# Patient Record
Sex: Female | Born: 1980 | Hispanic: Yes | Smoking: Never smoker
Health system: Southern US, Community
[De-identification: ages and names within clinical notes are randomized; demographics above are authoritative.]

## PROBLEM LIST (undated history)

## (undated) ENCOUNTER — Inpatient Hospital Stay (HOSPITAL_COMMUNITY): Payer: Self-pay

## (undated) DIAGNOSIS — F419 Anxiety disorder, unspecified: Secondary | ICD-10-CM

## (undated) DIAGNOSIS — M549 Dorsalgia, unspecified: Secondary | ICD-10-CM

## (undated) DIAGNOSIS — G43909 Migraine, unspecified, not intractable, without status migrainosus: Secondary | ICD-10-CM

## (undated) DIAGNOSIS — F32A Depression, unspecified: Secondary | ICD-10-CM

## (undated) DIAGNOSIS — M542 Cervicalgia: Secondary | ICD-10-CM

## (undated) DIAGNOSIS — F329 Major depressive disorder, single episode, unspecified: Secondary | ICD-10-CM

## (undated) DIAGNOSIS — M1990 Unspecified osteoarthritis, unspecified site: Secondary | ICD-10-CM

## (undated) DIAGNOSIS — J45909 Unspecified asthma, uncomplicated: Secondary | ICD-10-CM

## (undated) DIAGNOSIS — K219 Gastro-esophageal reflux disease without esophagitis: Secondary | ICD-10-CM

## (undated) HISTORY — PX: OTHER SURGICAL HISTORY: SHX169

## (undated) HISTORY — DX: Dorsalgia, unspecified: M54.9

---

## 2012-01-23 ENCOUNTER — Emergency Department (HOSPITAL_COMMUNITY)
Admission: EM | Admit: 2012-01-23 | Discharge: 2012-01-24 | Disposition: A | Discharge status: Home / Self Care | Payer: Worker's Compensation | Attending: Emergency Medicine | Admitting: Emergency Medicine

## 2012-01-23 ENCOUNTER — Encounter (HOSPITAL_COMMUNITY): Payer: Self-pay

## 2012-01-23 DIAGNOSIS — R110 Nausea: Secondary | ICD-10-CM | POA: Insufficient documentation

## 2012-01-23 DIAGNOSIS — R109 Unspecified abdominal pain: Secondary | ICD-10-CM | POA: Insufficient documentation

## 2012-01-23 HISTORY — DX: Cervicalgia: M54.2

## 2012-01-23 MED ORDER — SODIUM CHLORIDE 0.9 % IV BOLUS (SEPSIS)
1000.0000 mL | Freq: Once | INTRAVENOUS | Status: AC
  Administered 2012-01-24: 1000 mL via INTRAVENOUS

## 2012-01-23 MED ORDER — MORPHINE SULFATE 4 MG/ML IJ SOLN
6.0000 mg | Freq: Once | INTRAMUSCULAR | Status: AC
  Administered 2012-01-24: 6 mg via INTRAVENOUS
  Filled 2012-01-23: qty 2

## 2012-01-23 NOTE — ED Notes (Signed)
Pt complains of generalized abd pain since last Wednesday, nausea, no vomiting or diarrhea

## 2012-01-24 ENCOUNTER — Emergency Department (HOSPITAL_COMMUNITY): Payer: Worker's Compensation

## 2012-01-24 LAB — COMPREHENSIVE METABOLIC PANEL
Albumin: 3.8 g/dL (ref 3.5–5.2)
BUN: 12 mg/dL (ref 6–23)
Calcium: 8.4 mg/dL (ref 8.4–10.5)
Chloride: 105 mEq/L (ref 96–112)
Creatinine, Ser: 0.67 mg/dL (ref 0.50–1.10)
GFR calc non Af Amer: >90 mL/min (ref >90)
Total Bilirubin: 0.2 mg/dL — ABNORMAL LOW (ref 0.3–1.2)

## 2012-01-24 LAB — CBC
HCT: 34.4 % — ABNORMAL LOW (ref 36.0–46.0)
MCH: 29.9 pg (ref 26.0–34.0)
MCHC: 34.3 g/dL (ref 30.0–36.0)
MCV: 87.1 fL (ref 78.0–100.0)
RDW: 12.4 % (ref 11.5–15.5)
WBC: 9.7 10*3/uL (ref 4.0–10.5)

## 2012-01-24 LAB — URINALYSIS, ROUTINE W REFLEX MICROSCOPIC
Bilirubin Urine: NEGATIVE
Ketones, ur: NEGATIVE mg/dL
Protein, ur: NEGATIVE mg/dL
Urobilinogen, UA: 0.2 mg/dL (ref 0.0–1.0)

## 2012-01-24 LAB — URINE MICROSCOPIC-ADD ON

## 2012-01-24 LAB — LIPASE, BLOOD: Lipase: 21 U/L (ref 11–59)

## 2012-01-24 MED ORDER — ONDANSETRON HCL 4 MG PO TABS: 4.0000 mg | ORAL_TABLET | Freq: Four times a day (QID) | ORAL | Status: AC

## 2012-01-24 MED ORDER — HYDROCODONE-ACETAMINOPHEN 5-500 MG PO TABS
1.0000 | ORAL_TABLET | Freq: Four times a day (QID) | ORAL | Status: AC | PRN
Start: 2012-01-24 — End: 2012-02-03

## 2012-01-24 MED ORDER — MORPHINE SULFATE 4 MG/ML IJ SOLN
6.0000 mg | Freq: Once | INTRAMUSCULAR | Status: AC
  Administered 2012-01-24: 6 mg via INTRAVENOUS
  Filled 2012-01-24: qty 2

## 2012-01-24 NOTE — ED Provider Notes (Signed)
History     CSN: [id number]  Arrival date & time 01/23/12  2150   First MD Initiated Contact with Patient 01/23/12 2353      Chief Complaint  Patient presents with  . Abdominal Pain    (Consider location/radiation/quality/duration/timing/severity/associated sxs/prior treatment) The history is provided by the patient.   patient reports approximately one week of intermittent abdominal pain.  She's had nausea without vomiting or diarrhea.  She denies fevers or chills.  She's had mild decreased oral intake.  She reports her pain is in her lower abdomen and some of her pain is in her upper abdomen.  She has no prior history of gallstones.  She's never had an appendectomy.  She denies dysuria or urinary frequency.  She has no vaginal pain discharge or discomfort.  She denies flank pain.  She reports her pain will calm for 10-15 minutes and then will subside.  Is described as a sharp cramping tenderness.  Her pain is mild to moderate when it presents.  She does have pain at this time.  Nothing worsens her symptoms.  Nothing improves her symptoms  Past Medical History  Diagnosis Date  . Neck pain     History reviewed. No pertinent past surgical history.  History reviewed. No pertinent family history.  History  Substance Use Topics  . Smoking status: Not on file  . Smokeless tobacco: Not on file  . Alcohol Use: No    OB History    Grav Para Term Preterm Abortions TAB SAB Ect Mult Living                  Review of Systems  Gastrointestinal: Positive for abdominal pain.  All other systems reviewed and are negative.    Allergies  Review of patient's allergies indicates no known allergies.  Home Medications   Current Outpatient Rx  Name Route Sig Dispense Refill  . CYCLOBENZAPRINE HCL 10 MG PO TABS Oral Take 10 mg by mouth 3 (three) times daily.    Marland Kitchen HYDROCODONE-ACETAMINOPHEN 5-325 MG PO TABS Oral Take 1 tablet by mouth every 6 (six) hours as needed. Pain    . MELOXICAM 15  MG PO TABS Oral Take 15 mg by mouth daily.    Marland Kitchen HYDROCODONE-ACETAMINOPHEN 5-500 MG PO TABS Oral Take 1-2 tablets by mouth every 6 (six) hours as needed for pain. 15 tablet 0    BP 109/64  Pulse 66  Temp(Src) 99.1 F (37.3 C) (Oral)  Resp 17  SpO2 99%  LMP 01/07/2012  Physical Exam  Nursing note and vitals reviewed. Constitutional: She is oriented to person, place, and time. She appears well-developed and well-nourished. No distress.  HENT:  Head: Normocephalic and atraumatic.  Eyes: EOM are normal.  Neck: Normal range of motion.  Cardiovascular: Normal rate, regular rhythm and normal heart sounds.   Pulmonary/Chest: Effort normal and breath sounds normal.  Abdominal: Soft. She exhibits no distension. There is no tenderness.  Musculoskeletal: Normal range of motion.  Neurological: [name] and oriented to person, place, and time.  Skin: Skin is warm and dry.  Psychiatric: She has a normal mood and affect. Judgment normal.    ED Course  Procedures (including critical care time)  Labs Reviewed  URINALYSIS, ROUTINE W REFLEX MICROSCOPIC - Abnormal; Notable for the following:    Hgb urine dipstick LARGE (*)    All other components within normal limits  CBC - Abnormal; Notable for the following:    Hemoglobin 11.8 (*)  HCT 34.4 (*)    All other components within normal limits  COMPREHENSIVE METABOLIC PANEL - Abnormal; Notable for the following:    Potassium 3.3 (*)    Glucose, Bld 123 (*)    Total Bilirubin 0.2 (*)    All other components within normal limits  PREGNANCY, URINE  LIPASE, BLOOD  URINE MICROSCOPIC-ADD ON   Ct Abdomen Pelvis Wo Contrast  01/24/2012  *RADIOLOGY REPORT*  Clinical Data: Low pelvic pain radiating into the abdomen. Microhematuria.  White cell count 9.7.  CT ABDOMEN AND PELVIS WITHOUT CONTRAST  Technique:  Multidetector CT imaging of the abdomen and pelvis was performed following the standard protocol without intravenous contrast.  Comparison:  None.  Findings: The lung bases are clear.  The kidneys appear symmetrical.  No renal, ureteral, or bladder stones.  No pyelocaliectasis or ureterectasis.  The bladder wall is not thickened.  The unenhanced appearance of the liver, spleen, gallbladder, pancreas, adrenal glands, abdominal aorta, and retroperitoneal lymph nodes is unremarkable.  The stomach, small bowel, and colon are not distended.  No free air or free fluid in the abdomen. Small umbilical hernia containing fat.  Pelvis:  The the appendix is normal.  The uterus and adnexal structures are not enlarged.  Calcifications consistent with phleboliths.  Fat in the left inguinal canal.  No free or loculated pelvic fluid collections.  Mild degenerative changes in the lumbar spine.  No evidence of ascites.  Plain film changes are consistent with abdominal fat.  IMPRESSION: No renal or ureteral stone or obstruction.  Small umbilical and left inguinal hernias containing fat.  No specific abnormalities identified to account for the patient's symptoms.  Original Report Authenticated By: Marlon Pel, M.D.   Dg Abd 2 Views  01/24/2012  *RADIOLOGY REPORT*  Clinical Data: Lower abdominal pain and nausea for 24 hours.  ABDOMEN - 2 VIEW  Comparison: None  Findings: Gas and stool throughout the colon.  No small or large bowel distension.  Lucencies along the pericolic gutters likely represents fat but there is displacement of the colon shadows medially.  Ascites may be present.  There is no evidence of free air on the upright films.  No abnormal air fluid levels.  No radiopaque stones.  IMPRESSION: Nonobstructive bowel gas pattern.  Lucency along the lateral abdomen likely representing fat.  Possible changes due to ascites. No free air.  Original Report Authenticated By: Marlon Pel, M.D.     1. Abdominal pain       MDM  Given the patient's fluctuating and intermittent abdominal pain as well as hematuria CT scan of her abdomen was performed  without contrast.  There is no evidence of ureteral stones.  There is no urinary tract infection present.  Her labs are normal.  Her CT of her abdomen is otherwise unremarkable.  Repeat abdominal exam demonstrates nonfocal abdominal discomfort.  Discharge home with primary care followup.  Her labs are otherwise normal.  5:03 AM The patient reports he feels much better at this time        Lyanne Co, MD 01/24/12 [phone]

## 2012-03-06 ENCOUNTER — Emergency Department (HOSPITAL_COMMUNITY)
Admission: EM | Admit: 2012-03-06 | Discharge: 2012-03-06 | Disposition: A | Discharge status: Home / Self Care | Payer: Worker's Compensation | Attending: Emergency Medicine | Admitting: Emergency Medicine

## 2012-03-06 ENCOUNTER — Emergency Department (HOSPITAL_COMMUNITY): Payer: Worker's Compensation

## 2012-03-06 ENCOUNTER — Encounter (HOSPITAL_COMMUNITY): Payer: Self-pay | Admitting: Emergency Medicine

## 2012-03-06 DIAGNOSIS — R51 Headache: Secondary | ICD-10-CM

## 2012-03-06 HISTORY — DX: Migraine, unspecified, not intractable, without status migrainosus: G43.909

## 2012-03-06 LAB — CBC WITH DIFFERENTIAL/PLATELET
Basophils Relative: 0 % (ref 0–1)
Eosinophils Absolute: 0 10*3/uL (ref 0.0–0.7)
HCT: 36.2 % (ref 36.0–46.0)
Hemoglobin: 12.3 g/dL (ref 12.0–15.0)
MCH: 29.3 pg (ref 26.0–34.0)
MCHC: 34 g/dL (ref 30.0–36.0)
Monocytes Absolute: 0.8 10*3/uL (ref 0.1–1.0)
Monocytes Relative: 6 % (ref 3–12)

## 2012-03-06 LAB — BASIC METABOLIC PANEL
BUN: 10 mg/dL (ref 6–23)
Calcium: 9.3 mg/dL (ref 8.4–10.5)
Creatinine, Ser: 0.56 mg/dL (ref 0.50–1.10)
GFR calc Af Amer: >90 mL/min (ref >90)
GFR calc non Af Amer: >90 mL/min (ref >90)

## 2012-03-06 LAB — CSF CELL COUNT WITH DIFFERENTIAL
RBC Count, CSF: 2 /mm3 — ABNORMAL HIGH
Tube #: 4
WBC, CSF: 0 /mm3 (ref 0–5)
WBC, CSF: 0 /mm3 (ref 0–5)

## 2012-03-06 LAB — PROTEIN, CSF: Total  Protein, CSF: 26 mg/dL (ref 15–45)

## 2012-03-06 LAB — GRAM STAIN: Gram Stain: NONE SEEN

## 2012-03-06 MED ORDER — LIDOCAINE HCL 1 % IJ SOLN
INTRAMUSCULAR | Status: AC
  Filled 2012-03-06: qty 20

## 2012-03-06 MED ORDER — MORPHINE SULFATE 4 MG/ML IJ SOLN
4.0000 mg | Freq: Once | INTRAMUSCULAR | Status: AC
  Administered 2012-03-06: 4 mg via INTRAVENOUS
  Filled 2012-03-06: qty 1

## 2012-03-06 MED ORDER — SODIUM CHLORIDE 0.9 % IV BOLUS (SEPSIS)
1000.0000 mL | Freq: Once | INTRAVENOUS | Status: AC
  Administered 2012-03-06 (×2): 500 mL via INTRAVENOUS

## 2012-03-06 MED ORDER — METOCLOPRAMIDE HCL 5 MG/ML IJ SOLN
10.0000 mg | Freq: Once | INTRAMUSCULAR | Status: AC
  Administered 2012-03-06: 10 mg via INTRAVENOUS
  Filled 2012-03-06: qty 2

## 2012-03-06 MED ORDER — DEXAMETHASONE SODIUM PHOSPHATE 10 MG/ML IJ SOLN
10.0000 mg | Freq: Once | INTRAMUSCULAR | Status: AC
  Administered 2012-03-06: 10 mg via INTRAVENOUS
  Filled 2012-03-06: qty 1

## 2012-03-06 NOTE — ED Notes (Addendum)
Pt c/o dizziness, headache, nausea, and generalized weakness that started at 5a this morning.  Pt has hx of migraines and was diagnosed 8 years ago.  Denies blurry vision and chest pain.

## 2012-03-06 NOTE — ED Provider Notes (Addendum)
History     CSN: [id number]  Arrival date & time 03/06/12  1111   First MD Initiated Contact with Patient 03/06/12 1112      Chief Complaint  Patient presents with  . Migraine    (Consider location/radiation/quality/duration/timing/severity/associated sxs/prior treatment) HPI  30yoF previously healthy pw headache. C/O diffuse 10/10 headache "the worse headache of my life" since awakening this morning. Denies photo/phonophobia. Denies neck stiffness. Denies fever. +Chills. +nausea and vomiting x 3. Pt states she has been taking chronic pain medication including norco and ultram for chronic back pain since fall May 2013. Her first does of ultram was yesterday and she believes that may be the cause for her pain. Unk head trauma at time of fall. She has remote h/o migraines 8 years ago and no ongoing problems with headaches since that time. Denies numbness/tingling/weakness extr.  ED Notes, ED Provider Notes from 03/06/12 0000 to 03/06/12 11:20:32       Joesphine Bare, RN 03/06/2012 11:17      Per EMS- pt c/o chills, nausea, headache, pt has a hx of migraines x1 month, Pt had a fall in May, 2013 at work that has caused severe headaches. Pt picked up from health dept.     Past Medical History  Diagnosis Date  . Neck pain   . Migraines     History reviewed. No pertinent past surgical history.  No family history on file.  History  Substance Use Topics  . Smoking status: Not on file  . Smokeless tobacco: Not on file  . Alcohol Use: No    OB History    Grav Para Term Preterm Abortions TAB SAB Ect Mult Living                  Review of Systems  All other systems reviewed and are negative.  except as noted HPI   Allergies  Review of patient's allergies indicates no known allergies.  Home Medications   Current Outpatient Rx  Name Route Sig Dispense Refill  . CYCLOBENZAPRINE HCL 10 MG PO TABS Oral Take 10 mg by mouth 3 (three) times daily.    Marland Kitchen  HYDROCODONE-ACETAMINOPHEN 5-325 MG PO TABS Oral Take 1 tablet by mouth every 6 (six) hours as needed. Pain    . METHYLPREDNISOLONE 4 MG PO KIT Oral Take 4 mg by mouth See admin instructions. Follow package directions. Take 6 tabs on day 1, 5 tasb on day 2, 4 tabs on day 3, 3 tabs on day 4, 2 tabs on day 5, 1 tab on day 6.    . TRAMADOL HCL 50 MG PO TABS Oral Take 50 mg by mouth every 6 (six) hours as needed.      BP 116/77  Pulse 93  Temp 99.2 F (37.3 C)  SpO2 97%  Physical Exam  Nursing note and vitals reviewed. Constitutional: She is oriented to person, place, and time. She appears well-developed.       Appears to be in pain  HENT:  Head: Atraumatic.  Mouth/Throat: Oropharynx is clear and moist.  Eyes: Conjunctivae and EOM are normal. Pupils are equal, round, and reactive to light.       No photophobia  Neck: Normal range of motion. Neck supple.  Cardiovascular: Normal rate, regular rhythm, normal heart sounds and intact distal pulses.   Pulmonary/Chest: Effort normal and breath sounds normal. No respiratory distress. She has no wheezes. She has no rales.  Abdominal: Soft. She exhibits no distension. There is no tenderness.  There is no rebound and no guarding.  Musculoskeletal: Normal range of motion.  Neurological: [name] and oriented to person, place, and time. No cranial nerve deficit. She exhibits normal muscle tone. Coordination normal.  Skin: Skin is warm and dry. No rash noted.  Psychiatric: She has a normal mood and affect.    Date: 03/26/2012  Rate: 80  Rhythm: normal sinus rhythm  QRS Axis: right  Intervals: normal  ST/T Wave abnormalities: normal  Conduction Disutrbances:none  Narrative Interpretation:   Old EKG Reviewed: none available    ED Course  Procedures (including critical care time)  Labs Reviewed  CBC WITH DIFFERENTIAL - Abnormal; Notable for the following:    WBC 12.0 (*)     Neutro Abs 9.1 (*)     All other components within normal  limits  BASIC METABOLIC PANEL - Abnormal; Notable for the following:    Potassium 3.4 (*)     Glucose, Bld 149 (*)     All other components within normal limits  GLUCOSE, CAPILLARY - Abnormal; Notable for the following:    Glucose-Capillary 138 (*)     All other components within normal limits  PREGNANCY, URINE  CSF CELL COUNT WITH DIFFERENTIAL  CSF CELL COUNT WITH DIFFERENTIAL  GLUCOSE, CSF  PROTEIN, CSF   Ct Head Wo Contrast  03/06/2012  *RADIOLOGY REPORT*  Clinical Data: Headaches.  CT HEAD WITHOUT CONTRAST  Technique:  Contiguous axial images were obtained from the base of the skull through the vertex without contrast.  Comparison: None.  Findings: No evidence for acute hemorrhage, mass lesion, midline shift, hydrocephalus or large infarct.  There is a punctate calcification along the periphery of the right frontal lobe which is nonspecific.  The visualized paranasal sinuses are clear.  No acute bony abnormality. The cerebellar tonsils extend down to the skull base but they are not clearly low-lying.  IMPRESSION: No acute intracranial findings.  Original Report Authenticated By: Richarda Overlie, M.D.    1. Headache     MDM  C/O worst headache of life. Poor historian. Possible post traumatic but cannot specifically recall hitting her head when she fell in May. Neuro intact. Mild leukocytosis but likely related to steroid use for her chronic back pain. LP attempt in ED--failed. To send over to have fluoro guided LP. CSF pending. Pain control--has gotten reglan, IVF, decadron, morphine. Previously held benadryl and narcotics due to min lethargy on arrival. Now alert. Signed out to Dr. Juleen China.        Forbes Cellar, MD 03/06/12 1449  Forbes Cellar, MD 03/26/12 [phone]

## 2012-03-06 NOTE — ED Notes (Signed)
Initiated call with IR, spoke with Mrytle.

## 2012-03-06 NOTE — ED Notes (Signed)
YNW:GN56<OZ> Expected date:<BR> Expected time:11:12 AM<BR> Means of arrival:Ambulance<BR> Comments:<BR> M40-chronic migraines

## 2012-03-06 NOTE — ED Notes (Signed)
CBG 138

## 2012-03-06 NOTE — Procedures (Signed)
LP under fluoro performed at the L3/4 level. Opening pressure 19 cm H2O. 8 cc clear csf collected.

## 2012-03-06 NOTE — Progress Notes (Signed)
Pt confirms she does not have a pcp but goes to whom her worker compensation case worker assigns

## 2012-03-06 NOTE — ED Notes (Signed)
Per EMS- pt c/o chills, nausea, headache, pt has a hx of migraines x1 month, Pt had a fall in May, 2013 at work that has caused severe headaches.  Pt picked up from health dept.

## 2012-05-28 ENCOUNTER — Other Ambulatory Visit: Payer: Self-pay | Admitting: Orthopedic Surgery

## 2012-06-04 ENCOUNTER — Encounter (HOSPITAL_COMMUNITY): Payer: Self-pay | Admitting: *Deleted

## 2012-06-04 MED ORDER — POVIDONE-IODINE 7.5 % EX SOLN
Freq: Once | CUTANEOUS | Status: DC
  Filled 2012-06-04: qty 118

## 2012-06-04 MED ORDER — CEFAZOLIN SODIUM-DEXTROSE 2-3 GM-% IV SOLR
2.0000 g | INTRAVENOUS | Status: AC
  Administered 2012-06-05: 2 g via INTRAVENOUS
  Filled 2012-06-04: qty 50

## 2012-06-05 ENCOUNTER — Encounter (HOSPITAL_COMMUNITY): Payer: Self-pay | Admitting: Certified Registered"

## 2012-06-05 ENCOUNTER — Encounter (HOSPITAL_COMMUNITY): Payer: Self-pay | Admitting: *Deleted

## 2012-06-05 ENCOUNTER — Encounter (HOSPITAL_COMMUNITY): Payer: Self-pay | Admitting: Pharmacy Technician

## 2012-06-05 ENCOUNTER — Ambulatory Visit (HOSPITAL_COMMUNITY): Payer: Worker's Compensation

## 2012-06-05 ENCOUNTER — Encounter (HOSPITAL_COMMUNITY)
Admission: RE | Disposition: A | Discharge status: Home / Self Care | Payer: Self-pay | Source: Ambulatory Visit | Attending: Orthopedic Surgery

## 2012-06-05 ENCOUNTER — Inpatient Hospital Stay (HOSPITAL_COMMUNITY)
Admission: RE | Admit: 2012-06-05 | Discharge: 2012-06-06 | DRG: 491 | Disposition: A | Discharge status: Home / Self Care | Payer: Worker's Compensation | Source: Ambulatory Visit | Attending: Orthopedic Surgery | Admitting: Orthopedic Surgery

## 2012-06-05 ENCOUNTER — Ambulatory Visit (HOSPITAL_COMMUNITY): Payer: Worker's Compensation | Admitting: Certified Registered"

## 2012-06-05 DIAGNOSIS — R110 Nausea: Secondary | ICD-10-CM | POA: Diagnosis not present

## 2012-06-05 DIAGNOSIS — S33101A Dislocation of unspecified lumbar vertebra, initial encounter: Principal | ICD-10-CM | POA: Diagnosis present

## 2012-06-05 DIAGNOSIS — M5410 Radiculopathy, site unspecified: Secondary | ICD-10-CM

## 2012-06-05 DIAGNOSIS — IMO0002 Reserved for concepts with insufficient information to code with codable children: Secondary | ICD-10-CM | POA: Diagnosis present

## 2012-06-05 DIAGNOSIS — X58XXXA Exposure to other specified factors, initial encounter: Secondary | ICD-10-CM | POA: Diagnosis present

## 2012-06-05 HISTORY — PX: LUMBAR LAMINECTOMY/DECOMPRESSION MICRODISCECTOMY: SHX5026

## 2012-06-05 LAB — CBC WITH DIFFERENTIAL/PLATELET
Basophils Relative: 0 % (ref 0–1)
Eosinophils Absolute: 0.1 10*3/uL (ref 0.0–0.7)
HCT: 36.7 % (ref 36.0–46.0)
Hemoglobin: 12.9 g/dL (ref 12.0–15.0)
MCH: 30.1 pg (ref 26.0–34.0)
MCHC: 35.1 g/dL (ref 30.0–36.0)
MCV: 85.7 fL (ref 78.0–100.0)
Monocytes Absolute: 0.4 10*3/uL (ref 0.1–1.0)
Monocytes Relative: 7 % (ref 3–12)

## 2012-06-05 LAB — TYPE AND SCREEN

## 2012-06-05 LAB — COMPREHENSIVE METABOLIC PANEL
Albumin: 4.1 g/dL (ref 3.5–5.2)
BUN: 8 mg/dL (ref 6–23)
Creatinine, Ser: 0.61 mg/dL (ref 0.50–1.10)
GFR calc Af Amer: >90 mL/min (ref >90)
Total Bilirubin: 0.4 mg/dL (ref 0.3–1.2)
Total Protein: 7.7 g/dL (ref 6.0–8.3)

## 2012-06-05 LAB — URINALYSIS, ROUTINE W REFLEX MICROSCOPIC
Bilirubin Urine: NEGATIVE
Hgb urine dipstick: NEGATIVE
Ketones, ur: NEGATIVE mg/dL
Nitrite: NEGATIVE
pH: 6 (ref 5.0–8.0)

## 2012-06-05 LAB — PROTIME-INR
INR: 0.99 (ref 0.00–1.49)
Prothrombin Time: 13 seconds (ref 11.6–15.2)

## 2012-06-05 SURGERY — LUMBAR LAMINECTOMY/DECOMPRESSION MICRODISCECTOMY
Anesthesia: General | Site: Spine Lumbar | Laterality: Left | Wound class: Clean

## 2012-06-05 MED ORDER — MIDAZOLAM HCL 5 MG/5ML IJ SOLN
INTRAMUSCULAR | Status: DC | PRN
  Administered 2012-06-05: 2 mg via INTRAVENOUS

## 2012-06-05 MED ORDER — HYDROMORPHONE HCL PF 1 MG/ML IJ SOLN
INTRAMUSCULAR | Status: AC
  Filled 2012-06-05: qty 1

## 2012-06-05 MED ORDER — LIDOCAINE HCL (CARDIAC) 20 MG/ML IV SOLN
INTRAVENOUS | Status: DC | PRN
  Administered 2012-06-05: 80 mg via INTRAVENOUS

## 2012-06-05 MED ORDER — METHYLPREDNISOLONE ACETATE 80 MG/ML IJ SUSP
INTRAMUSCULAR | Status: AC
  Filled 2012-06-05: qty 1

## 2012-06-05 MED ORDER — PROMETHAZINE HCL 25 MG/ML IJ SOLN
12.5000 mg | Freq: Once | INTRAMUSCULAR | Status: AC
  Administered 2012-06-05: 12.5 mg via INTRAVENOUS

## 2012-06-05 MED ORDER — ONDANSETRON HCL 4 MG/2ML IJ SOLN
INTRAMUSCULAR | Status: DC | PRN
  Administered 2012-06-05: 4 mg via INTRAVENOUS

## 2012-06-05 MED ORDER — 0.9 % SODIUM CHLORIDE (POUR BTL) OPTIME
TOPICAL | Status: DC | PRN
  Administered 2012-06-05: 1000 mL

## 2012-06-05 MED ORDER — THROMBIN 20000 UNITS EX SOLR
CUTANEOUS | Status: DC | PRN
  Administered 2012-06-05: 16:00:00 via TOPICAL

## 2012-06-05 MED ORDER — ONDANSETRON HCL 4 MG/2ML IJ SOLN
INTRAMUSCULAR | Status: AC
  Filled 2012-06-05: qty 2

## 2012-06-05 MED ORDER — INDIGOTINDISULFONATE SODIUM 8 MG/ML IJ SOLN
INTRAMUSCULAR | Status: DC | PRN
  Administered 2012-06-05: .5 mL via INTRAVENOUS

## 2012-06-05 MED ORDER — DIAZEPAM 5 MG PO TABS
5.0000 mg | ORAL_TABLET | Freq: Four times a day (QID) | ORAL | Status: DC | PRN
  Administered 2012-06-06 (×3): 5 mg via ORAL
  Filled 2012-06-05 (×3): qty 1

## 2012-06-05 MED ORDER — PROPOFOL 10 MG/ML IV BOLUS
INTRAVENOUS | Status: DC | PRN
  Administered 2012-06-05: 200 mg via INTRAVENOUS

## 2012-06-05 MED ORDER — BUPIVACAINE-EPINEPHRINE PF 0.25-1:200000 % IJ SOLN
INTRAMUSCULAR | Status: AC
  Filled 2012-06-05: qty 30

## 2012-06-05 MED ORDER — ONDANSETRON HCL 4 MG/2ML IJ SOLN
4.0000 mg | INTRAMUSCULAR | Status: DC | PRN
  Administered 2012-06-06: 4 mg via INTRAVENOUS
  Filled 2012-06-05: qty 2

## 2012-06-05 MED ORDER — HYDROMORPHONE HCL PF 1 MG/ML IJ SOLN
0.2500 mg | INTRAMUSCULAR | Status: DC | PRN
  Administered 2012-06-05 (×5): 0.5 mg via INTRAVENOUS

## 2012-06-05 MED ORDER — VECURONIUM BROMIDE 10 MG IV SOLR
INTRAVENOUS | Status: DC | PRN
  Administered 2012-06-05: 6 mg via INTRAVENOUS

## 2012-06-05 MED ORDER — THROMBIN 20000 UNITS EX SOLR
CUTANEOUS | Status: DC | PRN
  Administered 2012-06-05: 20000 [IU] via TOPICAL

## 2012-06-05 MED ORDER — MUPIROCIN 2 % EX OINT
TOPICAL_OINTMENT | Freq: Once | CUTANEOUS | Status: DC
  Filled 2012-06-05: qty 22

## 2012-06-05 MED ORDER — LIDOCAINE HCL 4 % MT SOLN
OROMUCOSAL | Status: DC | PRN
  Administered 2012-06-05: 4 mL via TOPICAL

## 2012-06-05 MED ORDER — SUFENTANIL CITRATE 50 MCG/ML IV SOLN
INTRAVENOUS | Status: DC | PRN
  Administered 2012-06-05: 5 ug via INTRAVENOUS
  Administered 2012-06-05 (×2): 10 ug via INTRAVENOUS
  Administered 2012-06-05: 15 ug via INTRAVENOUS
  Administered 2012-06-05: 10 ug via INTRAVENOUS

## 2012-06-05 MED ORDER — LACTATED RINGERS IV SOLN
INTRAVENOUS | Status: DC
  Administered 2012-06-05 (×2): via INTRAVENOUS

## 2012-06-05 MED ORDER — METHYLPREDNISOLONE ACETATE 40 MG/ML IJ SUSP
INTRAMUSCULAR | Status: DC | PRN
  Administered 2012-06-05: 1 mL via INTRA_ARTICULAR

## 2012-06-05 MED ORDER — METHYLPREDNISOLONE ACETATE 40 MG/ML IJ SUSP
40.0000 mg | Freq: Once | INTRAMUSCULAR | Status: DC
  Filled 2012-06-05: qty 1

## 2012-06-05 MED ORDER — HYDROMORPHONE HCL PF 1 MG/ML IJ SOLN
INTRAMUSCULAR | Status: AC
  Administered 2012-06-05: 0.5 mg via INTRAVENOUS
  Filled 2012-06-05: qty 1

## 2012-06-05 MED ORDER — HYDROMORPHONE HCL PF 1 MG/ML IJ SOLN
0.5000 mg | INTRAMUSCULAR | Status: DC | PRN
  Administered 2012-06-05: 0.5 mg via INTRAVENOUS

## 2012-06-05 MED ORDER — OXYCODONE-ACETAMINOPHEN 5-325 MG PO TABS
1.0000 | ORAL_TABLET | ORAL | Status: DC | PRN
  Administered 2012-06-06 (×2): 1 via ORAL
  Filled 2012-06-05 (×2): qty 1

## 2012-06-05 MED ORDER — OXYCODONE HCL 5 MG/5ML PO SOLN: 5.0000 mg | Freq: Once | ORAL | Status: DC | PRN

## 2012-06-05 MED ORDER — INDIGOTINDISULFONATE SODIUM 8 MG/ML IJ SOLN
INTRAMUSCULAR | Status: AC
  Filled 2012-06-05: qty 5

## 2012-06-05 MED ORDER — PROMETHAZINE HCL 25 MG/ML IJ SOLN
INTRAMUSCULAR | Status: AC
  Filled 2012-06-05: qty 1

## 2012-06-05 MED ORDER — MORPHINE SULFATE 2 MG/ML IJ SOLN
2.0000 mg | INTRAMUSCULAR | Status: DC | PRN
  Administered 2012-06-06: 2 mg via INTRAVENOUS
  Filled 2012-06-05: qty 1

## 2012-06-05 MED ORDER — HEMOSTATIC AGENTS (NO CHARGE) OPTIME
TOPICAL | Status: DC | PRN
  Administered 2012-06-05: 1 via TOPICAL

## 2012-06-05 MED ORDER — THROMBIN 20000 UNITS EX SOLR
CUTANEOUS | Status: AC
  Filled 2012-06-05: qty 40000

## 2012-06-05 MED ORDER — ONDANSETRON HCL 4 MG/2ML IJ SOLN
4.0000 mg | Freq: Once | INTRAMUSCULAR | Status: AC | PRN
  Administered 2012-06-05: 4 mg via INTRAVENOUS

## 2012-06-05 MED ORDER — MUPIROCIN 2 % EX OINT
TOPICAL_OINTMENT | CUTANEOUS | Status: AC
  Filled 2012-06-05: qty 22

## 2012-06-05 MED ORDER — OXYCODONE HCL 5 MG PO TABS: 5.0000 mg | ORAL_TABLET | Freq: Once | ORAL | Status: DC | PRN

## 2012-06-05 SURGICAL SUPPLY — 63 items
BENZOIN TINCTURE PRP APPL 2/3 (GAUZE/BANDAGES/DRESSINGS) ×2 IMPLANT
BUR ROUND PRECISION 4.0 (BURR) ×2 IMPLANT
CANISTER SUCTION 2500CC (MISCELLANEOUS) ×2 IMPLANT
CHERRY SPONGEY 1/2 (GAUZE/BANDAGES/DRESSINGS) ×2 IMPLANT
CLOTH BEACON ORANGE TIMEOUT ST (SAFETY) ×2 IMPLANT
CONT SPEC STER OR (MISCELLANEOUS) ×2 IMPLANT
CORDS BIPOLAR (ELECTRODE) ×2 IMPLANT
COVER SURGICAL LIGHT HANDLE (MISCELLANEOUS) ×2 IMPLANT
DECANTER SPIKE VIAL GLASS SM (MISCELLANEOUS) ×2 IMPLANT
DRAIN CHANNEL 10F 3/8 F FF (DRAIN) IMPLANT
DRAPE POUCH INSTRU U-SHP 10X18 (DRAPES) ×4 IMPLANT
DRAPE SURG 17X23 STRL (DRAPES) ×8 IMPLANT
DURAPREP 26ML APPLICATOR (WOUND CARE) ×2 IMPLANT
ELECT BLADE 4.0 EZ CLEAN MEGAD (MISCELLANEOUS) ×2
ELECT BLADE 6.5 EXT (BLADE) IMPLANT
ELECT CAUTERY BLADE 6.4 (BLADE) ×2 IMPLANT
ELECT REM PT RETURN 9FT ADLT (ELECTROSURGICAL) ×2
ELECTRODE BLDE 4.0 EZ CLN MEGD (MISCELLANEOUS) ×1 IMPLANT
ELECTRODE REM PT RTRN 9FT ADLT (ELECTROSURGICAL) ×1 IMPLANT
EVACUATOR SILICONE 100CC (DRAIN) IMPLANT
FILTER STRAW FLUID ASPIR (MISCELLANEOUS) ×2 IMPLANT
GAUZE SPONGE 4X4 16PLY XRAY LF (GAUZE/BANDAGES/DRESSINGS) ×4 IMPLANT
GLOVE BIO SURGEON STRL SZ8 (GLOVE) ×2 IMPLANT
GLOVE BIOGEL PI IND STRL 8 (GLOVE) ×1 IMPLANT
GLOVE BIOGEL PI INDICATOR 8 (GLOVE) ×1
GOWN PREVENTION PLUS LG XLONG (DISPOSABLE) ×2 IMPLANT
GOWN STRL NON-REIN LRG LVL3 (GOWN DISPOSABLE) ×4 IMPLANT
IV CATH 14GX2 1/4 (CATHETERS) ×2 IMPLANT
KIT BASIN OR (CUSTOM PROCEDURE TRAY) ×2 IMPLANT
KIT ROOM TURNOVER OR (KITS) ×2 IMPLANT
NEEDLE 18GX1X1/2 (RX/OR ONLY) (NEEDLE) ×2 IMPLANT
NEEDLE 22X1 1/2 (OR ONLY) (NEEDLE) ×2 IMPLANT
NEEDLE HYPO 25GX1X1/2 BEV (NEEDLE) ×2 IMPLANT
NEEDLE SPNL 18GX3.5 QUINCKE PK (NEEDLE) ×4 IMPLANT
NS IRRIG 1000ML POUR BTL (IV SOLUTION) ×2 IMPLANT
PACK LAMINECTOMY ORTHO (CUSTOM PROCEDURE TRAY) ×2 IMPLANT
PACK UNIVERSAL I (CUSTOM PROCEDURE TRAY) ×2 IMPLANT
PAD ARMBOARD 7.5X6 YLW CONV (MISCELLANEOUS) ×4 IMPLANT
PATTIES SURGICAL .5 X.5 (GAUZE/BANDAGES/DRESSINGS) ×2 IMPLANT
PATTIES SURGICAL .5 X1 (DISPOSABLE) ×2 IMPLANT
PATTIES SURGICAL 1X1 (DISPOSABLE) IMPLANT
SPONGE GAUZE 4X4 12PLY (GAUZE/BANDAGES/DRESSINGS) ×2 IMPLANT
SPONGE SURGIFOAM ABS GEL 100C (HEMOSTASIS) ×2 IMPLANT
STRIP CLOSURE SKIN 1/2X4 (GAUZE/BANDAGES/DRESSINGS) ×2 IMPLANT
SURGIFLO TRUKIT (HEMOSTASIS) ×2 IMPLANT
SUT ETHILON 3 0 FSL (SUTURE) IMPLANT
SUT MNCRL AB 4-0 PS2 18 (SUTURE) ×2 IMPLANT
SUT VIC AB 0 CT1 27 (SUTURE)
SUT VIC AB 0 CT1 27XBRD ANBCTR (SUTURE) IMPLANT
SUT VIC AB 0 CT2 27 (SUTURE) ×2 IMPLANT
SUT VIC AB 1 CT1 18XCR BRD 8 (SUTURE) ×1 IMPLANT
SUT VIC AB 1 CT1 8-18 (SUTURE) ×1
SUT VIC AB 2-0 CT2 18 VCP726D (SUTURE) ×2 IMPLANT
SYR 20CC LL (SYRINGE) IMPLANT
SYR BULB IRRIGATION 50ML (SYRINGE) ×2 IMPLANT
SYR CONTROL 10ML LL (SYRINGE) ×4 IMPLANT
SYR TB 1ML 26GX3/8 SAFETY (SYRINGE) ×4 IMPLANT
SYR TB 1ML LUER SLIP (SYRINGE) ×4 IMPLANT
TAPE CLOTH SURG 4X10 WHT LF (GAUZE/BANDAGES/DRESSINGS) ×2 IMPLANT
TOWEL OR 17X24 6PK STRL BLUE (TOWEL DISPOSABLE) ×2 IMPLANT
TOWEL OR 17X26 10 PK STRL BLUE (TOWEL DISPOSABLE) ×2 IMPLANT
WATER STERILE IRR 1000ML POUR (IV SOLUTION) ×2 IMPLANT
YANKAUER SUCT BULB TIP NO VENT (SUCTIONS) ×2 IMPLANT

## 2012-06-05 NOTE — H&P (Signed)
PREOPERATIVE H&P  Chief Complaint: LEFT LEG PAIN  HPI: [name] is a 31 y.o. female who presents with left leg pain  Past Medical History  Diagnosis Date  . Neck pain   . Migraines    Past Surgical History  Procedure Date  . No past surgeries    History   Social History  . Marital Status: Single    Spouse Name: N/A    Number of Children: N/A  . Years of Education: N/A   Social History Main Topics  . Smoking status: Never Smoker   . Smokeless tobacco: None  . Alcohol Use: No  . Drug Use: No  . Sexually Active:    Other Topics Concern  . None   Social History Narrative  . None   History reviewed. No pertinent family history. No Known Allergies Prior to Admission medications   Medication Sig Start Date End Date Taking? Authorizing Provider  cyclobenzaprine (FLEXERIL) 10 MG tablet Take 10 mg by mouth 3 (three) times daily.    Historical Provider, MD  HYDROcodone-acetaminophen (NORCO) 5-325 MG per tablet Take 1 tablet by mouth every 6 (six) hours as needed. Pain    Historical Provider, MD  methylPREDNISolone (MEDROL DOSEPAK) 4 MG tablet Take 4 mg by mouth See admin instructions. Follow package directions. Take 6 tabs on day 1, 5 tasb on day 2, 4 tabs on day 3, 3 tabs on day 4, 2 tabs on day 5, 1 tab on day 6.    Historical Provider, MD  traMADol (ULTRAM) 50 MG tablet Take 50 mg by mouth every 6 (six) hours as needed.    Historical Provider, MD     All other systems have been reviewed and were otherwise negative with the exception of those mentioned in the HPI and as above.  Physical Exam: There were no vitals filed for this visit.  General: Alert, no acute distress Cardiovascular: No pedal edema Respiratory: No cyanosis, no use of accessory musculature GI: No organomegaly, abdomen is soft and non-tender Skin: No lesions in the area of chief complaint Neurologic: Sensation intact distally Psychiatric: Patient is competent for consent with normal mood and  affect Lymphatic: No axillary or cervical lymphadenopathy  MUSCULOSKELETAL: + SLR on left  Assessment/Plan: Low back pain with left leg pain Plan for Procedure(s): LUMBAR LAMINECTOMY/DECOMPRESSION MICRODISCECTOMY   Emilee Hero, MD 06/05/2012 6:12 AM

## 2012-06-05 NOTE — Transfer of Care (Signed)
Immediate Anesthesia Transfer of Care Note  Patient: [name]  Procedure(s) Performed: Procedure(s) (LRB) with comments: LUMBAR LAMINECTOMY/DECOMPRESSION MICRODISCECTOMY (Left) - Left sided lumbar 4-5 microdisectomy  Patient Location: PACU  Anesthesia Type: General  Level of Consciousness: awake, alert  and oriented  Airway & Oxygen Therapy: Patient Spontanous Breathing and Patient connected to nasal cannula oxygen  Post-op Assessment: Report given to PACU RN, Post -op Vital signs reviewed and stable and Patient moving all extremities  Post vital signs: Reviewed and stable  Complications: No apparent anesthesia complications

## 2012-06-05 NOTE — Anesthesia Postprocedure Evaluation (Signed)
  Anesthesia Post-op Note  Patient: [name]  Procedure(s) Performed: Procedure(s) (LRB) with comments: LUMBAR LAMINECTOMY/DECOMPRESSION MICRODISCECTOMY (Left) - Left sided lumbar 4-5 microdisectomy  Patient Location: PACU  Anesthesia Type: General  Level of Consciousness: awake and alert   Airway and Oxygen Therapy: Patient Spontanous Breathing and Patient connected to nasal cannula oxygen  Post-op Pain: mild  Post-op Assessment: Post-op Vital signs reviewed  Post-op Vital Signs: Reviewed  Complications: No apparent anesthesia complications

## 2012-06-05 NOTE — Preoperative (Signed)
Beta Blockers   Reason not to administer Beta Blockers:Not Applicable 

## 2012-06-05 NOTE — Anesthesia Preprocedure Evaluation (Addendum)
Anesthesia Evaluation  Patient identified by MRN, date of birth, ID band Patient awake    Reviewed: Allergy & Precautions, H&P , NPO status , Patient's Chart, lab work & pertinent test results  Airway Mallampati: II TM Distance: >3 FB Neck ROM: Full    Dental  (+) Teeth Intact and Dental Advisory Given   Pulmonary  breath sounds clear to auscultation        Cardiovascular Rhythm:Regular Rate:Normal     Neuro/Psych  Headaches,    GI/Hepatic   Endo/Other  Morbid obesity  Renal/GU      Musculoskeletal   Abdominal   Peds  Hematology   Anesthesia Other Findings   Reproductive/Obstetrics                          Anesthesia Physical Anesthesia Plan  ASA: II  Anesthesia Plan: General   Post-op Pain Management:    Induction: Intravenous  Airway Management Planned: Oral ETT  Additional Equipment:   Intra-op Plan:   Post-operative Plan: Extubation in OR  Informed Consent: I have reviewed the patients History and Physical, chart, labs and discussed the procedure including the risks, benefits and alternatives for the proposed anesthesia with the patient or authorized representative who has indicated his/her understanding and acceptance.   Dental advisory given  Plan Discussed with: CRNA, Anesthesiologist and Surgeon  Anesthesia Plan Comments:         Anesthesia Quick Evaluation

## 2012-06-06 ENCOUNTER — Encounter (HOSPITAL_COMMUNITY): Payer: Self-pay | Admitting: Orthopedic Surgery

## 2012-06-06 MED ORDER — DIAZEPAM 5 MG PO TABS: 5.0000 mg | ORAL_TABLET | Freq: Four times a day (QID) | ORAL | Status: DC | PRN

## 2012-06-06 MED ORDER — OXYCODONE-ACETAMINOPHEN 5-325 MG PO TABS
1.0000 | ORAL_TABLET | ORAL | Status: DC | PRN
  Administered 2012-06-06 (×2): 2 via ORAL
  Filled 2012-06-06 (×2): qty 2

## 2012-06-06 MED ORDER — OXYCODONE-ACETAMINOPHEN 5-325 MG PO TABS: 1.0000 | ORAL_TABLET | ORAL | Status: DC | PRN

## 2012-06-06 NOTE — Progress Notes (Signed)
Occupational Therapy Treatment Patient Details Name: [name] MRN: [id number] DOB: 05-01-1981 Today's Date: 06/06/2012 Time: 5621-3086 OT Time Calculation (min): 23 min  OT Assessment / Plan / Recommendation Comments on Treatment Session Pt verbalized understanding of compensatory techniques for ADL and use of AE. Pt educated on availability of AE. Pt ready for D/C. Again, it will be beneficial for pt to receive at least 1 HHOT visit to assist with proper home set up and education to reduce reinjuring back and readmission.    Follow Up Recommendations  Home health OT    Barriers to Discharge  None    Equipment Recommendations  3 in 1 bedside comode;Rolling walker with 5" wheels;Other (comment)    Recommendations for Other Services  none  Frequency Min 2X/week   Plan Discharge plan remains appropriate    Precautions / Restrictions Precautions Precautions: Back Precaution Booklet Issued: Yes (comment) Restrictions Weight Bearing Restrictions: No   Pertinent Vitals/Pain 7    ADL  Grooming: Simulated;Set up Where Assessed - Grooming: Supported standing Upper Body Bathing: Simulated;Set up Where Assessed - Upper Body Bathing: Unsupported sitting Lower Body Bathing: Simulated;Moderate assistance Where Assessed - Lower Body Bathing: Unsupported sit to stand Upper Body Dressing: Simulated;Set up Where Assessed - Upper Body Dressing: Unsupported sitting Lower Body Dressing: Simulated;Moderate assistance Where Assessed - Lower Body Dressing: Unsupported sit to stand Toilet Transfer: Simulated Toilet Transfer Method: Sit to stand Toilet Transfer Equipment: Bedside commode Toileting - Clothing Manipulation and Hygiene: Simulated;Minimal assistance Where Assessed - Engineer, mining and Hygiene: Standing Tub/Shower Transfer: Paramedic Method: Ambulating Equipment Used: Gait belt;Rolling walker Transfers/Ambulation Related  to ADLs: Min a initially, then S at tend of eval ADL Comments: Educated pt on use of AE for LB ADL. Pt able to complete @ S level  with use of AE. Pt will benefit from hip kit. Discussed childcare s/p back surgery also. Pt verbalized understanding.    OT Diagnosis: Generalized weakness;Acute pain  OT Problem List: Decreased strength;Decreased range of motion;Decreased activity tolerance;Decreased safety awareness;Decreased knowledge of use of DME or AE;Decreased knowledge of precautions;Pain OT Treatment Interventions: Self-care/ADL training;Energy conservation;DME and/or AE instruction;Therapeutic activities;Patient/family education   OT Goals Acute Rehab OT Goals OT Goal Formulation: With patient Time For Goal Achievement: 06/13/12 Potential to Achieve Goals: Good ADL Goals Pt Will Perform Lower Body Bathing: with supervision;Sit to stand from chair;Unsupported;with adaptive equipment ADL Goal: Lower Body Bathing - Progress: Met Pt Will Perform Lower Body Dressing: with supervision;Sit to stand from chair;Unsupported;with adaptive equipment ADL Goal: Lower Body Dressing - Progress: Met Pt Will Perform Toileting - Clothing Manipulation: with supervision;Standing ADL Goal: Toileting - Clothing Manipulation - Progress: Met Pt Will Perform Toileting - Hygiene: with supervision;Sit to stand from 3-in-1/toilet;Standing at 3-in-1/toilet;with adaptive equipment ADL Goal: Toileting - Hygiene - Progress: Met Additional ADL Goal #1: Pt will state 3/3 back precautions ADL Goal: Additional Goal #1 - Progress: Met Arm Goals Pt Will Complete Theraputty Exer: Independently;Mod resistance putty;Bilateral upper extremities;to increase strength Arm Goal: Theraputty Exercises - Progress: Met  Visit Information  Last OT Received On: 06/06/12 Assistance Needed: +1    Subjective Data      Prior Functioning  Home Living Lives With: Spouse;Family Available Help at Discharge: Available  PRN/intermittently;Family Type of Home: House Home Access: Stairs to enter [employer] of Steps: 1  Home Layout: Two level;Able to live on main level with bedroom/bathroom Alternate Level Stairs-Number of Steps: 6 Bathroom Shower/Tub: Health visitor: Standard Bathroom Accessibility: No  Home Adaptive Equipment: None Prior Function Level of Independence: Independent Able to Take Stairs?: Yes Driving: No Vocation: Full time employment Communication Communication: Prefers language other than English;Other (comment) (able to communicate however) Dominant Hand: Right    Cognition  Overall Cognitive Status: Appears within functional limits for tasks assessed/performed Arousal/Alertness: Awake/alert Orientation Level: Appears intact for tasks assessed Behavior During Session: [facility] for tasks performed    Mobility  Shoulder Instructions Bed Mobility Bed Mobility: Supine to Sit;Sitting - Scoot to [place] of Bed;Sit to Supine Rolling Right: 5: Supervision Rolling Left: 5: Supervision Right Sidelying to Sit: 5: Supervision Supine to Sit: 5: Supervision Sitting - Scoot to Edge of Bed: 5: Supervision Sit to Supine: 4: Min assist Details for Bed Mobility Assistance: VC's for body positioning and technique with back precautions Transfers Transfers: Sit to Stand;Stand to Sit Sit to Stand: 5: Supervision Stand to Sit: 5: Supervision Details for Transfer Assistance: VCs for hand placement       Exercises  Shoulder Exercises Digit Composite Flexion: Strengthening;Both;10 reps Composite Extension: Strengthening;Both;10 reps;Other (comment) (given theraputty HEP) Hand Exercises Thumb Abduction: Strengthening;Both;10 reps;Other (comment) (theraputty) Opposition: Strengthening;Both;10 reps;Other (comment) (theraputty) Other Exercises Other Exercises: Given fine motor and coordination HEP   Balance     End of Session OT - End of Session Equipment Utilized During  Treatment: Gait belt Activity Tolerance: Patient tolerated treatment well Patient left: in chair;with call bell/phone within reach Nurse Communication: Mobility status;Precautions  GO     Natina Wiginton,HILLARY 06/06/2012, 12:05 PM [facility], OTR/L  [phone] 06/06/2012

## 2012-06-06 NOTE — Evaluation (Signed)
Physical Therapy Evaluation Patient Details Name: Chelse Matas MRN: [id number] DOB: 17-Aug-1981 Today's Date: 06/06/2012 Time: 1191-4782 PT Time Calculation (min): 24 min  PT Assessment / Plan / Recommendation Clinical Impression  Pt is a cooperative 31 y.o. female s/p spinal sx.  Patient reports significant amount of pain but was able to perform ambulation and single step negotation safely despite pain.  Patient educated on back precautions and technique for safe mobility while complying with precautions.  Feel patient will be safe for discharge home with family assistance and home PT.Marland Kitchen    PT Assessment  Patient needs continued PT services    Follow Up Recommendations  Home health PT    Barriers to Discharge        Equipment Recommendations  Rolling walker with 5" wheels;3 in 1 bedside comode    Recommendations for Other Services     Frequency Min 4X/week    Precautions / Restrictions Precautions Precautions: Back Precaution Booklet Issued: Yes (comment) Restrictions Weight Bearing Restrictions: No   Pertinent Vitals/Pain 7.5/10      Mobility  Bed Mobility Bed Mobility: Rolling Right;Rolling Left;Right Sidelying to Sit Rolling Right: 5: Supervision Rolling Left: 5: Supervision Right Sidelying to Sit: 5: Supervision Details for Bed Mobility Assistance: VC's for body positioning and technique with back precautions Transfers Transfers: Sit to Stand;Stand to Sit Sit to Stand: 5: Supervision Stand to Sit: 5: Supervision Details for Transfer Assistance: VCs for hand placement Ambulation/Gait Ambulation/Gait Assistance: 4: Min guard;5: Supervision Ambulation Distance (Feet): 50 Feet Assistive device: Rolling walker Ambulation/Gait Assistance Details: Min guard initially but able to progress to supervision.   Gait Pattern: Step-to pattern;Decreased stride length;Antalgic;Shuffle Gait velocity: decreased significantly General Gait Details: patient initially unsteady, but  able to progress well.  Patient in a lot of pain which limits ambulation distance at this time.  Stairs: Yes Stairs Assistance: 5: Supervision Stairs Assistance Details (indicate cue type and reason): VC's required for proper technique using rw to go op the step to enter Stair Management Technique: Step to pattern;With walker;Forwards;No rails Number of Stairs: 1     Shoulder Instructions     Exercises     PT Diagnosis: Difficulty walking;Acute pain;Generalized weakness;Abnormality of gait  PT Problem List: Decreased strength;Decreased range of motion;Decreased activity tolerance;Decreased balance;Decreased mobility;Pain PT Treatment Interventions: DME instruction;Gait training;Stair training;Functional mobility training;Therapeutic activities;Therapeutic exercise;Patient/family education   PT Goals Acute Rehab PT Goals PT Goal Formulation: With patient Time For Goal Achievement: 06/10/12 Potential to Achieve Goals: Good Pt will Stand: with modified independence Pt will Ambulate: >150 feet;with modified independence;with rolling walker Pt will Go Up / Down Stairs: with supervision;6-9 stairs;with least restrictive assistive device  Visit Information  Last PT Received On: 06/06/12 Assistance Needed: +1    Subjective Data  Subjective: I have a lot of pain Patient Stated Goal: to go home    Prior Functioning  Home Living Lives With: Spouse;Family Available Help at Discharge: Available PRN/intermittently;Family Type of Home: House Home Access: Stairs to enter [employer] of Steps: 1  Home Layout: Two level;Able to live on main level with bedroom/bathroom Alternate Level Stairs-Number of Steps: 6 Bathroom Shower/Tub: Health visitor: Standard Bathroom Accessibility: No Home Adaptive Equipment: None Prior Function Level of Independence: Independent Able to Take Stairs?: Yes Driving: No Vocation: Full time employment Dominant Hand: Right      Cognition  Overall Cognitive Status: Appears within functional limits for tasks assessed/performed Arousal/Alertness: Awake/alert Orientation Level: Appears intact for tasks assessed Behavior During Session: [facility] for tasks  performed    Extremity/Trunk Assessment Right Upper Extremity Assessment RUE ROM/Strength/Tone: WFL for tasks assessed RUE Sensation: Deficits Left Upper Extremity Assessment LUE ROM/Strength/Tone: WFL for tasks assessed LUE Sensation: Deficits Right Lower Extremity Assessment RLE ROM/Strength/Tone: Deficits;Unable to fully assess;Due to pain;Due to precautions RLE Sensation: Deficits Left Lower Extremity Assessment LLE ROM/Strength/Tone: [facility] for tasks assessed   Balance    End of Session PT - End of Session Equipment Utilized During Treatment: Gait belt Activity Tolerance: Patient tolerated treatment well;Patient limited by fatigue;Patient limited by pain Patient left: in chair;with call bell/phone within reach Nurse Communication: Mobility status  GP     Fabio Asa 06/06/2012, 11:19 AM Charlotte Crumb, PT DPT  [phone]

## 2012-06-06 NOTE — Progress Notes (Signed)
Utilization review completed. Tahirih Lair, RN, BSN. 

## 2012-06-06 NOTE — Progress Notes (Signed)
Patient admitted overnight for nausea, LBP.  Currently c/o LBP and pain around flanks in area of bedding padding.  BP 120/62  Pulse 86  Temp 98.9 F (37.2 C) (Oral)  Resp 16  Ht 5\' 4"  (1.626 m)  Wt 85 kg (187 lb 6.3 oz)  BMI 32.17 kg/m2  SpO2 100%  LMP 03/10/2012  NVI 4+/5 strength b/l LE limited by LBP Dressing CDI  POD #1 after left L4/5 microdisc  - patient needs to be mobilized this morning to a chair - d/c home later today with percocet and valium - f/u my office in 2 weeks

## 2012-06-06 NOTE — Progress Notes (Signed)
CARE MANAGEMENT NOTE 06/06/2012  Patient:  [name],[name]   Account Number:  [id number]  Date Initiated:  06/06/2012  Documentation initiated by:  Vance Peper  Subjective/Objective Assessment:   31 yr old female s/p L4-5 microdisckectomy.     Action/Plan:   Patient is under worker's comp.  [address]  [place] [phone]. [phone]  Fax: [phone]  Transportation: [employer]   [phone]   Anticipated DC Date:  06/06/2012   Anticipated DC Plan:        DC Planning Services  CM consult      PAC Choice  DURABLE MEDICAL EQUIPMENT  HOME HEALTH   Choice offered to / List presented to:     DME arranged  3-N-1  WALKER - ROLLING  OTHER - SEE COMMENT      DME agency  OTHER - SEE NOTE     HH arranged  HH-2 PT  HH-3 OT      HH agency  OTHER - SEE NOTE   Status of service:  In process, will continue to follow Medicare Important Message given?   (If response is "NO", the following Medicare IM given date fields will be blank) Date Medicare IM given:   Date Additional Medicare IM given:    Discharge Disposition:    Per UR Regulation:    If discussed at Long Length of Stay Meetings, dates discussed:    Comments:  06/06/12 11:15 Vance Peper, RN BSN Case Manager Patient will also need HIP KIT. Case Manager waiting for a return call from adjuster.

## 2012-06-06 NOTE — Op Note (Signed)
NAMEJODI, [name]               ACCOUNT NO.:  [id number]  MEDICAL RECORD NO.:  [id number]  LOCATION:  5N05C                        FACILITY:  [facility]  PHYSICIAN:  Estill Bamberg, MD      DATE OF BIRTH:  02/15/1981  DATE OF PROCEDURE:  06/05/2012 DATE OF DISCHARGE:                              OPERATIVE REPORT   PREOPERATIVE DIAGNOSES: 1. Left-sided L5 radiculopathy. 2. 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  POSTOPERATIVE DIAGNOSES: 1. Left-sided L5 radiculopathy. 2. 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  PROCEDURE:  Left-sided L4-5 laminotomy, partial facetectomy, and removal of calcified adherent left-sided L4-5 disk fragment.  SURGEON:  Estill Bamberg, MD  ASSISTANT:  None.  ANESTHESIA:  General endotracheal anesthesia.  COMPLICATIONS:  None.  DISPOSITION:  Stable.  ESTIMATED BLOOD LOSS:  Minimal.  INDICATIONS FOR PROCEDURE:  Briefly, Ms. [name] is a very pleasant 31- year-old female, who presented to me with severe pain in her left leg. Of note, this was status post an injury that did occur on Jan 16, 2012. She did have a left-sided L5 selective nerve root block which did relieve her pain.  She also had other forms of conservative care, but continued to have pain.  MRI did clearly reveal a left-sided L4-5 disk herniation.  Given her failure of nonoperative measures, we did have a discussion regarding going forward with a left-sided L4-5 microdiskectomy.  The patient fully understood the risks and limitations of the procedure as outlined in my preoperative note.  OPERATIVE DETAILS:  On June 05, 2012, the patient was brought to surgery and general endotracheal anesthesia was administered.  The patient was placed prone on a well-padded Jackson table with spinal frame.  Antibiotics were given.  A time-out procedure was performed.  I then made an incision at the midline over the L4-5 interspace.  The left side of the fascia was sharply incised  just off the midline.  The paraspinal musculature was bluntly swept laterally.  I then placed a forward angle curette beneath the L3 lamina and a lateral intraoperative fluoroscopic view did confirm this to be the L3-4 interspace.  I then carried my dissection more inferiorly to the L4-5 interspace.  I did use a 4-mm high-speed bur to remove the inferior and medial aspect of the L4 lamina.  The ligamentum flavum was readily identified and taken down. The traversing L5 nerve was readily identified and noted to be under excessive tension.  I did meticulously and carefully tease the traversing L5 nerve away from the underlying disk.  With an assistant holding medial retraction, I was able to visualize the herniated disk fragment.  I did meticulously use a nerve hook in addition to a Penfield 4 to tease away the superficial layers overlying the disk and small fragments of disk did declare themselves and they were removed using a pituitary rongeur.  Upon additional exploration of the epidural space, there was noted to be additional disk fragments which were noted to be adherent to the L5 vertebral body.  The posterior anulus was also noted to be very calcified and was causing some additional pressure on the traversing L5 nerve.  I therefore did use a reverse angled Epstein curette to displace the  calcified and adherent disk fragments away from the L5 vertebral body.  I did remove all fragments that were causing compression of the traversing nerve.  Of note, there were additional fragments identified adherent to the L5 vertebral body into the disk space itself, however, these were not causing any undue pressure on the traversing L5 nerve and I did feel that continued attempts to trying remove all these fragments, would bring with it unnecessary risk to the traversing L5 nerve.  At this point, all epidural bleeding was controlled using bipolar electrocautery in addition to Surgiflo.  The wound  was then copiously irrigated.  A 40 mg Depo-Medrol was introduced about the epidural space.  The fascia was then closed using #1 Vicryl. The subcutaneous layer was closed using 0 Vicryl in addition to 2-0 Vicryl and the skin was closed using 3-0 Monocryl.  Benzoin and Steri- Strips were applied followed by sterile dressing.  All instrument counts were correct at the termination of the procedure.  The patient was then awakened and transferred to recovery in stable condition.     Estill Bamberg, MD     MD/MEDQ  D:  06/05/2012  T:  06/06/2012  Job:  [id number]

## 2012-06-06 NOTE — Progress Notes (Signed)
Occupational Therapy Evaluation Patient Details Name: [name] MRN: [id number] DOB: 1981/02/04 Today's Date: 06/06/2012 Time: 1035-1050 OT Time Calculation (min): 15 min  OT Assessment / Plan / Recommendation Clinical Impression  Pleasant 31 yo s/p lumbar lam and microdiscectomy. Pt will benefit from OT to max independence with ADL and funcitonal moiblity for ADL to facilitate D/C home today. Rec pt have HHOT/PT as f/u due to home set up (muliti level) and fact that pt will be home alone with 31 yo until older child gets home from school. Educated pt on back precautions and need to get extra assistance during the day to prevent further injury.    OT Assessment  Patient needs continued OT Services    Follow Up Recommendations  Home health OT (due to being home during the day alone with 31 yo. )    Barriers to Discharge None    Equipment Recommendations  3 in 1 bedside comode;Rolling walker with 5" wheels;Other (comment) ("hip kit")    Recommendations for Other Services  none  Frequency  Min 2X/week    Precautions / Restrictions Precautions Precautions: Back Precaution Booklet Issued: Yes (comment) Restrictions Weight Bearing Restrictions: No   Pertinent Vitals/Pain 9. Pain meds 2 hr prior to therapy session    ADL  Grooming: Simulated;Set up Where Assessed - Grooming: Supported standing Upper Body Bathing: Simulated;Set up Where Assessed - Upper Body Bathing: Unsupported sitting Lower Body Bathing: Simulated;Moderate assistance Where Assessed - Lower Body Bathing: Unsupported sit to stand Upper Body Dressing: Simulated;Set up Where Assessed - Upper Body Dressing: Unsupported sitting Lower Body Dressing: Simulated;Moderate assistance Where Assessed - Lower Body Dressing: Unsupported sit to stand Toilet Transfer: Simulated Toilet Transfer Method: Sit to stand Toilet Transfer Equipment: Bedside commode Toileting - Clothing Manipulation and Hygiene: Simulated;Minimal  assistance Where Assessed - Engineer, mining and Hygiene: Standing Tub/Shower Transfer: Paramedic Method: Ambulating Equipment Used: Gait belt;Rolling walker Transfers/Ambulation Related to ADLs: Min a initially, then S at tend of eval ADL Comments: a for LB ADL. will benefit from AE.    OT Diagnosis: Generalized weakness;Acute pain  OT Problem List: Decreased strength;Decreased range of motion;Decreased activity tolerance;Decreased safety awareness;Decreased knowledge of use of DME or AE;Decreased knowledge of precautions;Pain OT Treatment Interventions: Self-care/ADL training;Energy conservation;DME and/or AE instruction;Therapeutic activities;Patient/family education   OT Goals Acute Rehab OT Goals OT Goal Formulation: With patient Time For Goal Achievement: 06/13/12 ADL Goals Pt Will Perform Lower Body Bathing: with supervision;Sit to stand from chair;Unsupported;with adaptive equipment ADL Goal: Lower Body Bathing - Progress: Goal set today Pt Will Perform Lower Body Dressing: with supervision;Sit to stand from chair;Unsupported;with adaptive equipment ADL Goal: Lower Body Dressing - Progress: Goal set today Pt Will Perform Toileting - Clothing Manipulation: with supervision;Standing ADL Goal: Toileting - Clothing Manipulation - Progress: Goal set today Pt Will Perform Toileting - Hygiene: with supervision;Sit to stand from 3-in-1/toilet;Standing at 3-in-1/toilet;with adaptive equipment ADL Goal: Toileting - Hygiene - Progress: Goal set today Additional ADL Goal #1: Pt will state 3/3 back precautions ADL Goal: Additional Goal #1 - Progress: Goal set today  Visit Information  Last OT Received On: 06/06/12 Assistance Needed: +1    Subjective Data      Prior Functioning     Home Living Lives With: Spouse;Family Available Help at Discharge: Available PRN/intermittently;Family Type of Home: House Home Access: Stairs to  enter [employer] of Steps: 1  Home Layout: Two level;Able to live on main level with bedroom/bathroom Alternate Level Stairs-Number of Steps: 6  Bathroom Shower/Tub: Health visitor: Standard Bathroom Accessibility: No Home Adaptive Equipment: None Prior Function Level of Independence: Independent Able to Take Stairs?: Yes Driving: No Vocation: Full time employment Communication Communication: Prefers language other than English;Other (comment) (able to communicate however) Dominant Hand: Right         Vision/Perception     Cognition  Overall Cognitive Status: Appears within functional limits for tasks assessed/performed Arousal/Alertness: Awake/alert Orientation Level: Appears intact for tasks assessed Behavior During Session: [facility] for tasks performed    Extremity/Trunk Assessment Right Upper Extremity Assessment RUE ROM/Strength/Tone: Deficits RUE ROM/Strength/Tone Deficits: B weak grip and pinch strength @ 3+/5 RUE Sensation: Deficits RUE Sensation Deficits: c/o numbness/tingling RUE Coordination: Deficits RUE Coordination Deficits: drops items due to sensory deficits Left Upper Extremity Assessment LUE ROM/Strength/Tone: Deficits LUE ROM/Strength/Tone Deficits: same as R LUE Sensation: Deficits LUE Sensation Deficits: c/o numbness/tingling LUE Coordination: Deficits (due to sensory deficits) Right Lower Extremity Assessment RLE ROM/Strength/Tone: Deficits;Unable to fully assess;Due to pain;Due to precautions RLE Sensation: Deficits Left Lower Extremity Assessment LLE ROM/Strength/Tone: [facility] for tasks assessed Trunk Assessment Trunk Assessment: Normal     Mobility Bed Mobility Bed Mobility: Supine to Sit;Sitting - Scoot to [place] of Bed;Sit to Supine Rolling Right: 5: Supervision Rolling Left: 5: Supervision Right Sidelying to Sit: 5: Supervision Supine to Sit: 5: Supervision Sitting - Scoot to Edge of Bed: 5: Supervision Sit to  Supine: 4: Min assist Details for Bed Mobility Assistance: VC's for body positioning and technique with back precautions Transfers Transfers: Sit to Stand;Stand to Sit Sit to Stand: 5: Supervision Stand to Sit: 5: Supervision Details for Transfer Assistance: VCs for hand placement               Balance  WFl   End of Session OT - End of Session Equipment Utilized During Treatment: Gait belt Activity Tolerance: Patient tolerated treatment well Patient left: in chair;with call bell/phone within reach Nurse Communication: Mobility status;Precautions  GO     [name],[name] 06/06/2012, 11:49 AM Luisa Dago, OTR/L  [phone] 06/06/2012

## 2012-06-06 NOTE — Progress Notes (Signed)
  06/06/12 1530  Vance Peper, RN BSN Cm has made multiple calls to adjuster with no success. Received call from the adjuster- KAREN, she will contact agency to arrange [facility]. Received   call from Crystal with [employer] [phone], they will arrange [place]. Faxed orders to her at [phone].

## 2012-06-06 NOTE — Progress Notes (Signed)
D/C instructions and scripts given. Translator at bedside to assist with instructions and to transport pt home

## 2012-06-11 NOTE — Discharge Summary (Signed)
NAMETEYAH, ROSSY NO.:  [id number]  MEDICAL RECORD NO.:  [id number]  LOCATION:  5N05C                        FACILITY:  [facility]  PHYSICIAN:  Estill Bamberg, MD      DATE OF BIRTH:  [birth year]  DATE OF ADMISSION:  06/05/2012 DATE OF DISCHARGE:  06/06/2012                              DISCHARGE SUMMARY   ADMITTING PHYSICIAN:  Estill Bamberg, MD  ADMISSION DIAGNOSES: 1. Left-sided L5 radiculopathy. 2. 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  ADMISSION HISTORY:  Briefly, [name] is a pleasant 31 year old female who presented to me with severe pain in her left leg.  An MRI did reveal left-sided L4-5 disk herniation.  The patient had nonoperative care and did continue to have pain.  The patient was therefore admitted for a microdiskectomy procedure.  HOSPITAL COURSE:  On June 05, 2012, the patient was brought to Surgery and underwent the procedure reflected above.  The patient tolerated the procedure well and was transferred to recovery in stable condition.  The initial plan was to send the patient home on the same day of surgery.  However, the patient had significant anxiety, and concerns about going home.  Her pain did appear to be well controlled. However, she did initially have some nausea, which did improve, but she had some concerns and this would continue and she were to go home on the same day of surgery, and the patient was therefore admitted overnight. The patient was evaluated by me in the morning of postoperative day #1. The patient did have various generalized complaints regarding pain in her low back.  She did not have any focal neurologic deficits, but did have diminished strength globally throughout both her upper and lower extremities.  She had concerns about being mobilized.  She was ultimately, however, able to be mobilized with the Physical Therapy team.  The patient was then discharged home on the morning of postoperative day  #1.  The patient's dressing was clean, dry, and intact on the morning of postoperative day #1.  The patient was discharged home with home health physical therapy as well as a rolling walker in addition to a 3-in-1 bedside commode.  These are not typical DME devices that needed for surgery such as the patients, however, it was felt by the Physical Therapy team that this was needed.  DISCHARGE INSTRUCTIONS:  The patient will adhere to back precautions at all times.  She was given Percocet for pain and Valium for spasms.  She will follow up in my office at approximately 2 weeks after her surgery.     Estill Bamberg, MD     MD/MEDQ  D:  06/10/2012  T:  06/11/2012  Job:  [id number]

## 2012-06-17 ENCOUNTER — Other Ambulatory Visit (HOSPITAL_COMMUNITY): Payer: Self-pay | Admitting: Orthopedic Surgery

## 2012-06-17 ENCOUNTER — Inpatient Hospital Stay
Admission: RE | Admit: 2012-06-17 | Discharge: 2012-06-17 | Disposition: A | Discharge status: Home / Self Care | Payer: Self-pay | Source: Ambulatory Visit | Attending: Orthopedic Surgery | Admitting: Orthopedic Surgery

## 2012-06-17 DIAGNOSIS — R52 Pain, unspecified: Secondary | ICD-10-CM

## 2013-01-12 ENCOUNTER — Other Ambulatory Visit: Payer: Self-pay | Admitting: Orthopedic Surgery

## 2013-01-22 ENCOUNTER — Encounter (HOSPITAL_COMMUNITY)
Admission: RE | Admit: 2013-01-22 | Discharge: 2013-01-22 | Disposition: A | Discharge status: Home / Self Care | Payer: Worker's Compensation | Source: Ambulatory Visit | Attending: Orthopedic Surgery | Admitting: Orthopedic Surgery

## 2013-01-22 ENCOUNTER — Encounter (HOSPITAL_COMMUNITY): Payer: Self-pay

## 2013-01-22 ENCOUNTER — Encounter (HOSPITAL_COMMUNITY): Payer: Self-pay | Admitting: Pharmacist

## 2013-01-22 HISTORY — DX: Unspecified asthma, uncomplicated: J45.909

## 2013-01-22 HISTORY — DX: Gastro-esophageal reflux disease without esophagitis: K21.9

## 2013-01-22 HISTORY — DX: Depression, unspecified: F32.A

## 2013-01-22 HISTORY — DX: Anxiety disorder, unspecified: F41.9

## 2013-01-22 HISTORY — DX: Unspecified osteoarthritis, unspecified site: M19.90

## 2013-01-22 HISTORY — DX: Major depressive disorder, single episode, unspecified: F32.9

## 2013-01-22 LAB — COMPREHENSIVE METABOLIC PANEL
BUN: 6 mg/dL (ref 6–23)
CO2: 25 mEq/L (ref 19–32)
Chloride: 106 mEq/L (ref 96–112)
Creatinine, Ser: 0.71 mg/dL (ref 0.50–1.10)
GFR calc Af Amer: >90 mL/min (ref >90)
GFR calc non Af Amer: >90 mL/min (ref >90)
Glucose, Bld: 89 mg/dL (ref 70–99)
Total Bilirubin: 0.4 mg/dL (ref 0.3–1.2)

## 2013-01-22 LAB — CBC WITH DIFFERENTIAL/PLATELET
HCT: 36.3 % (ref 36.0–46.0)
Hemoglobin: 12.7 g/dL (ref 12.0–15.0)
Lymphocytes Relative: 36 % (ref 12–46)
MCHC: 35 g/dL (ref 30.0–36.0)
Monocytes Absolute: 0.6 10*3/uL (ref 0.1–1.0)
Monocytes Relative: 8 % (ref 3–12)
Neutro Abs: 3.5 10*3/uL (ref 1.7–7.7)
WBC: 7.2 10*3/uL (ref 4.0–10.5)

## 2013-01-22 LAB — PROTIME-INR
INR: 1 (ref 0.00–1.49)
Prothrombin Time: 13.1 seconds (ref 11.6–15.2)

## 2013-01-22 LAB — URINE MICROSCOPIC-ADD ON

## 2013-01-22 LAB — URINALYSIS, ROUTINE W REFLEX MICROSCOPIC
Bilirubin Urine: NEGATIVE
Glucose, UA: NEGATIVE mg/dL
Ketones, ur: NEGATIVE mg/dL
pH: 7.5 (ref 5.0–8.0)

## 2013-01-22 LAB — APTT: aPTT: 30 seconds (ref 24–37)

## 2013-01-22 NOTE — Progress Notes (Signed)
Pt. Reports in April 2014- rec'd Depoprovera injection, no period since then.

## 2013-01-22 NOTE — Pre-Procedure Instructions (Signed)
Neveyah Garzon  01/22/2013   Your procedure is scheduled on:  01/28/2013- Wednesday   Report to [name] at  AM of surgery call Short Stay office by 8:00a.m. To determine when to arrived in Short Stay the day of surgery   Call this number if you have problems the morning of surgery: [phone]   Remember:   Do not eat food or drink liquids after midnight. On Tuesday    Take these medicines the morning of surgery with A SIP OF WATER: pain medicine is OK the morning   Of  Surgery   Do not wear jewelry, make-up or nail polish.  Do not wear lotions, powders, or perfumes. You may wear deodorant.  Do not shave 48 hours prior to surgery.   Do not bring valuables to the hospital.  Contacts, dentures or bridgework may not be worn into surgery.  Leave suitcase in the car. After surgery it may be brought to your room.  For patients admitted to the hospital, checkout time is 11:00 AM the day of  discharge.   Patients discharged the day of surgery will not be allowed to drive  home.  Name and phone number of your driver:  With Teresita Madura   Special Instructions: Shower using CHG 2 nights before surgery and the night before surgery.  If you shower the day of surgery use CHG.  Use special wash - you have one bottle of CHG for all showers.  You should use approximately 1/3 of the bottle for each shower.   Please read over the following fact sheets that you were given: Pain Booklet, Coughing and Deep Breathing, Blood Transfusion Information, MRSA Information and Surgical Site Infection Prevention

## 2013-01-23 LAB — URINE CULTURE

## 2013-01-27 MED ORDER — POVIDONE-IODINE 7.5 % EX SOLN
Freq: Once | CUTANEOUS | Status: DC
  Filled 2013-01-27: qty 118

## 2013-01-27 MED ORDER — DEXTROSE 5 % IV SOLN
2.0000 g | INTRAVENOUS | Status: AC
  Administered 2013-01-28 (×2): 2 g via INTRAVENOUS

## 2013-01-28 ENCOUNTER — Inpatient Hospital Stay (HOSPITAL_COMMUNITY): Payer: Worker's Compensation | Admitting: Certified Registered Nurse Anesthetist

## 2013-01-28 ENCOUNTER — Encounter (HOSPITAL_COMMUNITY): Payer: Self-pay | Admitting: *Deleted

## 2013-01-28 ENCOUNTER — Encounter (HOSPITAL_COMMUNITY): Payer: Self-pay | Admitting: Certified Registered Nurse Anesthetist

## 2013-01-28 ENCOUNTER — Encounter (HOSPITAL_COMMUNITY)
Admission: RE | Disposition: A | Discharge status: Other Acute Inpatient Hospital | Payer: Self-pay | Source: Ambulatory Visit | Attending: Orthopedic Surgery

## 2013-01-28 ENCOUNTER — Inpatient Hospital Stay (HOSPITAL_COMMUNITY)
Admission: RE | Admit: 2013-01-28 | Discharge: 2013-02-03 | DRG: 460 | Payer: Worker's Compensation | Source: Ambulatory Visit | Attending: Orthopedic Surgery | Admitting: Orthopedic Surgery

## 2013-01-28 ENCOUNTER — Inpatient Hospital Stay (HOSPITAL_COMMUNITY): Payer: Worker's Compensation

## 2013-01-28 DIAGNOSIS — M79609 Pain in unspecified limb: Secondary | ICD-10-CM | POA: Diagnosis not present

## 2013-01-28 DIAGNOSIS — Y831 Surgical operation with implant of artificial internal device as the cause of abnormal reaction of the patient, or of later complication, without mention of misadventure at the time of the procedure: Secondary | ICD-10-CM | POA: Diagnosis not present

## 2013-01-28 DIAGNOSIS — F3289 Other specified depressive episodes: Secondary | ICD-10-CM | POA: Diagnosis present

## 2013-01-28 DIAGNOSIS — G43909 Migraine, unspecified, not intractable, without status migrainosus: Secondary | ICD-10-CM | POA: Diagnosis present

## 2013-01-28 DIAGNOSIS — T8489XA Other specified complication of internal orthopedic prosthetic devices, implants and grafts, initial encounter: Secondary | ICD-10-CM | POA: Diagnosis not present

## 2013-01-28 DIAGNOSIS — M5126 Other intervertebral disc displacement, lumbar region: Principal | ICD-10-CM | POA: Diagnosis present

## 2013-01-28 DIAGNOSIS — J45909 Unspecified asthma, uncomplicated: Secondary | ICD-10-CM | POA: Diagnosis present

## 2013-01-28 DIAGNOSIS — Z79899 Other long term (current) drug therapy: Secondary | ICD-10-CM

## 2013-01-28 DIAGNOSIS — F329 Major depressive disorder, single episode, unspecified: Secondary | ICD-10-CM | POA: Diagnosis present

## 2013-01-28 DIAGNOSIS — K219 Gastro-esophageal reflux disease without esophagitis: Secondary | ICD-10-CM | POA: Diagnosis present

## 2013-01-28 DIAGNOSIS — F411 Generalized anxiety disorder: Secondary | ICD-10-CM | POA: Diagnosis present

## 2013-01-28 SURGERY — POSTERIOR LUMBAR FUSION 1 LEVEL
Anesthesia: General | Site: Spine Lumbar | Laterality: Left | Wound class: Clean

## 2013-01-28 MED ORDER — SENNOSIDES-DOCUSATE SODIUM 8.6-50 MG PO TABS: 1.0000 | ORAL_TABLET | Freq: Every evening | ORAL | Status: DC | PRN

## 2013-01-28 MED ORDER — ONDANSETRON HCL 4 MG/2ML IJ SOLN
INTRAMUSCULAR | Status: DC | PRN
  Administered 2013-01-28: 4 mg via INTRAVENOUS

## 2013-01-28 MED ORDER — THROMBIN 20000 UNITS EX SOLR
CUTANEOUS | Status: DC | PRN
  Administered 2013-01-28: 20000 [IU] via TOPICAL

## 2013-01-28 MED ORDER — ALUM & MAG HYDROXIDE-SIMETH 200-200-20 MG/5ML PO SUSP: 30.0000 mL | Freq: Four times a day (QID) | ORAL | Status: DC | PRN

## 2013-01-28 MED ORDER — HYDROMORPHONE HCL PF 1 MG/ML IJ SOLN
INTRAMUSCULAR | Status: AC
  Filled 2013-01-28: qty 1

## 2013-01-28 MED ORDER — ALBUTEROL SULFATE HFA 108 (90 BASE) MCG/ACT IN AERS
INHALATION_SPRAY | RESPIRATORY_TRACT | Status: DC | PRN
  Administered 2013-01-28: 4 via RESPIRATORY_TRACT
  Administered 2013-01-28: 6 via RESPIRATORY_TRACT

## 2013-01-28 MED ORDER — SODIUM CHLORIDE 0.9 % IJ SOLN: 3.0000 mL | Freq: Two times a day (BID) | INTRAMUSCULAR | Status: DC

## 2013-01-28 MED ORDER — DIAZEPAM 5 MG PO TABS
5.0000 mg | ORAL_TABLET | Freq: Four times a day (QID) | ORAL | Status: DC | PRN
  Administered 2013-01-28 – 2013-01-29 (×2): 5 mg via ORAL
  Filled 2013-01-28 (×2): qty 1

## 2013-01-28 MED ORDER — BUPIVACAINE-EPINEPHRINE 0.25% -1:200000 IJ SOLN
INTRAMUSCULAR | Status: DC | PRN
  Administered 2013-01-28: 20 mL

## 2013-01-28 MED ORDER — SUCCINYLCHOLINE CHLORIDE 20 MG/ML IJ SOLN
INTRAMUSCULAR | Status: DC | PRN
  Administered 2013-01-28: 140 mg via INTRAVENOUS

## 2013-01-28 MED ORDER — MORPHINE SULFATE 2 MG/ML IJ SOLN
1.0000 mg | INTRAMUSCULAR | Status: DC | PRN
  Administered 2013-01-29 (×3): 4 mg via INTRAVENOUS
  Filled 2013-01-28 (×3): qty 2

## 2013-01-28 MED ORDER — ACETAMINOPHEN 325 MG PO TABS
650.0000 mg | ORAL_TABLET | ORAL | Status: DC | PRN
Start: 2013-01-28 — End: 2013-01-29

## 2013-01-28 MED ORDER — SODIUM CHLORIDE 0.9 % IV SOLN
INTRAVENOUS | Status: DC
  Administered 2013-01-28: via INTRAVENOUS

## 2013-01-28 MED ORDER — FENTANYL CITRATE 0.05 MG/ML IJ SOLN
INTRAMUSCULAR | Status: DC | PRN
  Administered 2013-01-28: 50 ug via INTRAVENOUS
  Administered 2013-01-28: 100 ug via INTRAVENOUS
  Administered 2013-01-28 (×2): 50 ug via INTRAVENOUS
  Administered 2013-01-28: 150 ug via INTRAVENOUS
  Administered 2013-01-28: 250 ug via INTRAVENOUS
  Administered 2013-01-28 (×2): 100 ug via INTRAVENOUS
  Administered 2013-01-28: 50 ug via INTRAVENOUS

## 2013-01-28 MED ORDER — FENTANYL CITRATE 0.05 MG/ML IJ SOLN: 100.0000 ug | Freq: Once | INTRAMUSCULAR | Status: AC

## 2013-01-28 MED ORDER — MENTHOL 3 MG MT LOZG: 1.0000 | LOZENGE | OROMUCOSAL | Status: DC | PRN

## 2013-01-28 MED ORDER — FLEET ENEMA 7-19 GM/118ML RE ENEM: 1.0000 | ENEMA | Freq: Once | RECTAL | Status: AC | PRN

## 2013-01-28 MED ORDER — ONDANSETRON HCL 4 MG/2ML IJ SOLN
4.0000 mg | INTRAMUSCULAR | Status: DC | PRN
  Administered 2013-01-29: 4 mg via INTRAVENOUS
  Filled 2013-01-28: qty 2

## 2013-01-28 MED ORDER — BUPIVACAINE-EPINEPHRINE PF 0.25-1:200000 % IJ SOLN
INTRAMUSCULAR | Status: AC
  Filled 2013-01-28: qty 30

## 2013-01-28 MED ORDER — ACETAMINOPHEN 650 MG RE SUPP: 650.0000 mg | RECTAL | Status: DC | PRN

## 2013-01-28 MED ORDER — SODIUM CHLORIDE 0.9 % IV SOLN: 250.0000 mL | INTRAVENOUS | Status: DC

## 2013-01-28 MED ORDER — OXYCODONE-ACETAMINOPHEN 5-325 MG PO TABS
1.0000 | ORAL_TABLET | ORAL | Status: DC | PRN
  Administered 2013-01-29 (×2): 2 via ORAL
  Filled 2013-01-28 (×2): qty 2

## 2013-01-28 MED ORDER — HEMOSTATIC AGENTS (NO CHARGE) OPTIME
TOPICAL | Status: DC | PRN
  Administered 2013-01-28: 1 via TOPICAL

## 2013-01-28 MED ORDER — FENTANYL CITRATE 0.05 MG/ML IJ SOLN
INTRAMUSCULAR | Status: AC
  Administered 2013-01-28: 100 ug via INTRAVENOUS
  Filled 2013-01-28: qty 2

## 2013-01-28 MED ORDER — HYDROMORPHONE HCL PF 1 MG/ML IJ SOLN
0.2500 mg | INTRAMUSCULAR | Status: DC | PRN
  Administered 2013-01-28 (×4): 0.5 mg via INTRAVENOUS

## 2013-01-28 MED ORDER — THROMBIN 20000 UNITS EX SOLR: CUTANEOUS | Status: DC | PRN

## 2013-01-28 MED ORDER — ALBUTEROL SULFATE HFA 108 (90 BASE) MCG/ACT IN AERS: 2.0000 | INHALATION_SPRAY | RESPIRATORY_TRACT | Status: DC | PRN

## 2013-01-28 MED ORDER — DIPHENHYDRAMINE HCL 25 MG PO TABS
25.0000 mg | ORAL_TABLET | Freq: Four times a day (QID) | ORAL | Status: DC
  Administered 2013-01-28 – 2013-01-29 (×2): 25 mg via ORAL
  Filled 2013-01-28 (×7): qty 1

## 2013-01-28 MED ORDER — LIDOCAINE HCL (CARDIAC) 20 MG/ML IV SOLN
INTRAVENOUS | Status: DC | PRN
  Administered 2013-01-28: 65 mg via INTRAVENOUS

## 2013-01-28 MED ORDER — CEFAZOLIN SODIUM 1-5 GM-% IV SOLN
1.0000 g | Freq: Three times a day (TID) | INTRAVENOUS | Status: AC
  Administered 2013-01-28 – 2013-01-29 (×2): 1 g via INTRAVENOUS
  Filled 2013-01-28 (×2): qty 50

## 2013-01-28 MED ORDER — BISACODYL 5 MG PO TBEC: 5.0000 mg | DELAYED_RELEASE_TABLET | Freq: Every day | ORAL | Status: DC | PRN

## 2013-01-28 MED ORDER — PROPOFOL 10 MG/ML IV BOLUS
INTRAVENOUS | Status: DC | PRN
  Administered 2013-01-28: 200 mg via INTRAVENOUS

## 2013-01-28 MED ORDER — LACTATED RINGERS IV SOLN
INTRAVENOUS | Status: DC
  Administered 2013-01-28: 13:00:00 via INTRAVENOUS

## 2013-01-28 MED ORDER — HYDROMORPHONE HCL PF 1 MG/ML IJ SOLN
0.5000 mg | Freq: Once | INTRAMUSCULAR | Status: AC
  Administered 2013-01-28: 0.5 mg via INTRAVENOUS

## 2013-01-28 MED ORDER — ROCURONIUM BROMIDE 100 MG/10ML IV SOLN
INTRAVENOUS | Status: DC | PRN
  Administered 2013-01-28: 20 mg via INTRAVENOUS
  Administered 2013-01-28: 50 mg via INTRAVENOUS

## 2013-01-28 MED ORDER — DOCUSATE SODIUM 100 MG PO CAPS
100.0000 mg | ORAL_CAPSULE | Freq: Two times a day (BID) | ORAL | Status: DC
  Administered 2013-01-28: 100 mg via ORAL
  Filled 2013-01-28 (×3): qty 1

## 2013-01-28 MED ORDER — SODIUM CHLORIDE 0.9 % IJ SOLN: 3.0000 mL | INTRAMUSCULAR | Status: DC | PRN

## 2013-01-28 MED ORDER — THROMBIN 20000 UNITS EX SOLR
CUTANEOUS | Status: AC
  Filled 2013-01-28: qty 40000

## 2013-01-28 MED ORDER — ZOLPIDEM TARTRATE 5 MG PO TABS: 5.0000 mg | ORAL_TABLET | Freq: Every evening | ORAL | Status: DC | PRN

## 2013-01-28 MED ORDER — MIDAZOLAM HCL 5 MG/5ML IJ SOLN
INTRAMUSCULAR | Status: DC | PRN
  Administered 2013-01-28: 2 mg via INTRAVENOUS

## 2013-01-28 MED ORDER — THROMBIN 20000 UNITS EX KIT
PACK | CUTANEOUS | Status: DC | PRN
  Administered 2013-01-28: 14:00:00 via TOPICAL

## 2013-01-28 MED ORDER — CEFAZOLIN SODIUM 1-5 GM-% IV SOLN
INTRAVENOUS | Status: AC
  Filled 2013-01-28: qty 100

## 2013-01-28 MED ORDER — CEFAZOLIN SODIUM-DEXTROSE 2-3 GM-% IV SOLR
INTRAVENOUS | Status: AC
  Filled 2013-01-28: qty 50

## 2013-01-28 MED ORDER — PHENOL 1.4 % MT LIQD: 1.0000 | OROMUCOSAL | Status: DC | PRN

## 2013-01-28 MED ORDER — 0.9 % SODIUM CHLORIDE (POUR BTL) OPTIME
TOPICAL | Status: DC | PRN
  Administered 2013-01-28: 1000 mL

## 2013-01-28 MED ORDER — LACTATED RINGERS IV SOLN
INTRAVENOUS | Status: DC | PRN
  Administered 2013-01-28 (×4): via INTRAVENOUS

## 2013-01-28 MED ORDER — ARTIFICIAL TEARS OP OINT
TOPICAL_OINTMENT | OPHTHALMIC | Status: DC | PRN
  Administered 2013-01-28: 1 via OPHTHALMIC

## 2013-01-28 MED ORDER — PROPOFOL INFUSION 10 MG/ML OPTIME
INTRAVENOUS | Status: DC | PRN
  Administered 2013-01-28: 75 ug/kg/min via INTRAVENOUS

## 2013-01-28 SURGICAL SUPPLY — 78 items
BENZOIN TINCTURE PRP APPL 2/3 (GAUZE/BANDAGES/DRESSINGS) ×2 IMPLANT
BLADE SURG ROTATE 9660 (MISCELLANEOUS) IMPLANT
BUR ROUND PRECISION 4.0 (BURR) ×2 IMPLANT
CAGE BULLET CONCORDE 9X9X27 (Cage) ×2 IMPLANT
CANISTER SUCTION 2500CC (MISCELLANEOUS) ×2 IMPLANT
CARTRIDGE OIL MAESTRO DRILL (MISCELLANEOUS) ×1 IMPLANT
CLOTH BEACON ORANGE TIMEOUT ST (SAFETY) ×2 IMPLANT
CONT SPEC STER OR (MISCELLANEOUS) ×2 IMPLANT
CORDS BIPOLAR (ELECTRODE) ×2 IMPLANT
COVER SURGICAL LIGHT HANDLE (MISCELLANEOUS) ×2 IMPLANT
DIFFUSER DRILL AIR PNEUMATIC (MISCELLANEOUS) ×2 IMPLANT
DRAIN CHANNEL 15F RND FF W/TCR (WOUND CARE) IMPLANT
DRAPE C-ARM 42X72 X-RAY (DRAPES) ×2 IMPLANT
DRAPE ORTHO SPLIT 77X108 STRL (DRAPES)
DRAPE POUCH INSTRU U-SHP 10X18 (DRAPES) ×2 IMPLANT
DRAPE SURG 17X23 STRL (DRAPES) ×6 IMPLANT
DRAPE SURG ORHT 6 SPLT 77X108 (DRAPES) IMPLANT
DURAPREP 26ML APPLICATOR (WOUND CARE) ×2 IMPLANT
ELECT BLADE 4.0 EZ CLEAN MEGAD (MISCELLANEOUS) ×2
ELECT CAUTERY BLADE 6.4 (BLADE) ×2 IMPLANT
ELECT REM PT RETURN 9FT ADLT (ELECTROSURGICAL) ×2
ELECTRODE BLDE 4.0 EZ CLN MEGD (MISCELLANEOUS) ×1 IMPLANT
ELECTRODE REM PT RTRN 9FT ADLT (ELECTROSURGICAL) ×1 IMPLANT
EVACUATOR SILICONE 100CC (DRAIN) IMPLANT
GAUZE SPONGE 4X4 16PLY XRAY LF (GAUZE/BANDAGES/DRESSINGS) ×2 IMPLANT
GLOVE BIO SURGEON STRL SZ7 (GLOVE) ×2 IMPLANT
GLOVE BIO SURGEON STRL SZ8 (GLOVE) ×4 IMPLANT
GLOVE BIOGEL PI IND STRL 7.0 (GLOVE) ×1 IMPLANT
GLOVE BIOGEL PI IND STRL 7.5 (GLOVE) ×1 IMPLANT
GLOVE BIOGEL PI IND STRL 8 (GLOVE) ×1 IMPLANT
GLOVE BIOGEL PI INDICATOR 7.0 (GLOVE) ×1
GLOVE BIOGEL PI INDICATOR 7.5 (GLOVE) ×1
GLOVE BIOGEL PI INDICATOR 8 (GLOVE) ×1
GLOVE BIOGEL PI ORTHO PRO 7.5 (GLOVE) ×1
GLOVE PI ORTHO PRO STRL 7.5 (GLOVE) ×1 IMPLANT
GLOVE SURG SS PI 7.0 STRL IVOR (GLOVE) ×4 IMPLANT
GLOVE SURG SS PI 7.5 STRL IVOR (GLOVE) ×2 IMPLANT
GOWN STRL NON-REIN LRG LVL3 (GOWN DISPOSABLE) ×4 IMPLANT
GOWN STRL REIN XL XLG (GOWN DISPOSABLE) ×4 IMPLANT
IV CATH 14GX2 1/4 (CATHETERS) ×2 IMPLANT
KIT BASIN OR (CUSTOM PROCEDURE TRAY) ×2 IMPLANT
KIT POSITION SURG JACKSON T1 (MISCELLANEOUS) IMPLANT
KIT ROOM TURNOVER OR (KITS) ×2 IMPLANT
MARKER SKIN DUAL TIP RULER LAB (MISCELLANEOUS) ×2 IMPLANT
NEEDLE BONE MARROW 8GX6 FENEST (NEEDLE) ×2 IMPLANT
NEEDLE HYPO 25GX1X1/2 BEV (NEEDLE) ×2 IMPLANT
NEEDLE SPNL 18GX3.5 QUINCKE PK (NEEDLE) ×4 IMPLANT
NS IRRIG 1000ML POUR BTL (IV SOLUTION) ×2 IMPLANT
OIL CARTRIDGE MAESTRO DRILL (MISCELLANEOUS) ×2
PACK LAMINECTOMY ORTHO (CUSTOM PROCEDURE TRAY) ×2 IMPLANT
PACK UNIVERSAL I (CUSTOM PROCEDURE TRAY) ×2 IMPLANT
PACK VITOSS BIOACTIVE 10CC (Neuro Prosthesis/Implant) ×2 IMPLANT
PAD ARMBOARD 7.5X6 YLW CONV (MISCELLANEOUS) ×4 IMPLANT
PATTIES SURGICAL .5 X1 (DISPOSABLE) ×2 IMPLANT
PATTIES SURGICAL .5X1.5 (GAUZE/BANDAGES/DRESSINGS) IMPLANT
ROD EXPEDIUM PREBENT 5.5X35MM (Rod) ×4 IMPLANT
SCREW EXPEDIUM POLYAXIAL 7X40M (Screw) ×4 IMPLANT
SCREW POLYAXIAL 7X45MM (Screw) ×4 IMPLANT
SCREW SET SINGLE INNER (Screw) ×8 IMPLANT
SPONGE GAUZE 4X4 12PLY (GAUZE/BANDAGES/DRESSINGS) ×2 IMPLANT
SPONGE INTESTINAL PEANUT (DISPOSABLE) ×2 IMPLANT
SPONGE SURGIFOAM ABS GEL 100 (HEMOSTASIS) ×2 IMPLANT
STRIP CLOSURE SKIN 1/2X4 (GAUZE/BANDAGES/DRESSINGS) ×2 IMPLANT
SURGIFLO TRUKIT (HEMOSTASIS) IMPLANT
SUT MNCRL AB 4-0 PS2 18 (SUTURE) ×2 IMPLANT
SUT VIC AB 0 CT1 18XCR BRD 8 (SUTURE) ×1 IMPLANT
SUT VIC AB 0 CT1 8-18 (SUTURE) ×1
SUT VIC AB 1 CT1 18XCR BRD 8 (SUTURE) ×1 IMPLANT
SUT VIC AB 1 CT1 8-18 (SUTURE) ×1
SUT VIC AB 2-0 CT2 18 VCP726D (SUTURE) ×2 IMPLANT
SYR 20CC LL (SYRINGE) ×2 IMPLANT
SYR BULB IRRIGATION 50ML (SYRINGE) ×2 IMPLANT
SYR CONTROL 10ML LL (SYRINGE) ×2 IMPLANT
TOWEL OR 17X24 6PK STRL BLUE (TOWEL DISPOSABLE) ×2 IMPLANT
TOWEL OR 17X26 10 PK STRL BLUE (TOWEL DISPOSABLE) ×2 IMPLANT
TRAY FOLEY CATH 14FR (SET/KITS/TRAYS/PACK) ×2 IMPLANT
WATER STERILE IRR 1000ML POUR (IV SOLUTION) IMPLANT
YANKAUER SUCT BULB TIP NO VENT (SUCTIONS) ×4 IMPLANT

## 2013-01-28 NOTE — Preoperative (Signed)
Beta Blockers   Reason not to administer Beta Blockers:Not Applicable 

## 2013-01-28 NOTE — Transfer of Care (Signed)
Immediate Anesthesia Transfer of Care Note  Patient: [name]  Procedure(s) Performed: Procedure(s) with comments: POSTERIOR LUMBAR FUSION 1 LEVEL (Left) - Left sided lumbar 4-5 transforaminal lumbar interbody fusion with instrumentation, vitoss, bone marrow aspirate.  Patient Location: PACU  Anesthesia Type:General  Level of Consciousness: sedated  Airway & Oxygen Therapy: Patient Spontanous Breathing and Patient connected to face mask oxygen  Post-op Assessment: Report given to PACU RN and Post -op Vital signs reviewed and stable  Post vital signs: Reviewed and stable  Complications: No apparent anesthesia complications

## 2013-01-28 NOTE — Progress Notes (Signed)
Dr. Michelle Piper called for a sign out

## 2013-01-28 NOTE — Anesthesia Postprocedure Evaluation (Signed)
Anesthesia Post Note  Patient: [name]  Procedure(s) Performed: Procedure(s) (LRB): POSTERIOR LUMBAR FUSION 1 LEVEL (Left)  Anesthesia type: general  Patient location: PACU  Post pain: Pain level controlled  Post assessment: Patient's Cardiovascular Status Stable  Last Vitals:  Filed Vitals:   01/28/13 2045  BP: 111/59  Pulse: 80  Temp:   Resp: 12    Post vital signs: Reviewed and stable  Level of consciousness: sedated  Complications: No apparent anesthesia complications

## 2013-01-28 NOTE — Anesthesia Preprocedure Evaluation (Signed)
Anesthesia Evaluation  Patient identified by MRN, date of birth, ID band Patient awake    Reviewed: Allergy & Precautions, H&P , NPO status , Patient's Chart, lab work & pertinent test results  Airway Mallampati: II TM Distance: >3 FB Neck ROM: Full    Dental   Pulmonary asthma ,  breath sounds clear to auscultation        Cardiovascular Rhythm:Regular Rate:Normal     Neuro/Psych  Headaches, Anxiety Depression    GI/Hepatic GERD-  ,  Endo/Other    Renal/GU      Musculoskeletal   Abdominal (+) + obese,   Peds  Hematology   Anesthesia Other Findings C/o back and leg pain L  Reproductive/Obstetrics                           Anesthesia Physical Anesthesia Plan  ASA: II  Anesthesia Plan: General   Post-op Pain Management:    Induction: Intravenous  Airway Management Planned: Oral ETT  Additional Equipment:   Intra-op Plan:   Post-operative Plan: Extubation in OR  Informed Consent: I have reviewed the patients History and Physical, chart, labs and discussed the procedure including the risks, benefits and alternatives for the proposed anesthesia with the patient or authorized representative who has indicated his/her understanding and acceptance.     Plan Discussed with: CRNA and Surgeon  Anesthesia Plan Comments:         Anesthesia Quick Evaluation

## 2013-01-28 NOTE — Progress Notes (Signed)
1230..the patient had stated earlier that she was having back pain--8/10....after getting IV in left forearm, 1250, i was then able to give her IV fentanyl per Dr. Malachy Mood order.  Pain level is now  0/100.Patricia KitchenMarland KitchenDA

## 2013-01-28 NOTE — H&P (Signed)
PREOPERATIVE H&P  Chief Complaint: LEFT LEG PAIN  HPI: [name] is a 32 y.o. 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 Pain is severe and ongoing.  Past Medical History  Diagnosis Date  . Neck pain   . Migraines   . Depression   . Anxiety     relative to circumstances to back problems/injury, not taking any medicine, pt. reports has been crying a lot  . Asthma   . GERD (gastroesophageal reflux disease)   . Arthritis     low back   Past Surgical History  Procedure Laterality Date  . No past surgeries    . Lumbar laminectomy/decompression microdiscectomy  06/05/2012    Procedure: LUMBAR LAMINECTOMY/DECOMPRESSION MICRODISCECTOMY;  Surgeon: Emilee Hero, MD;  Location: [facility] OR;  Service: Orthopedics;  Laterality: Left;  Left sided lumbar 4-5 microdisectomy  . Childbirth      x4 vaginal    History   Social History  . Marital Status: Single    Spouse Name: N/A    Number of Children: N/A  . Years of Education: N/A   Social History Main Topics  . Smoking status: Never Smoker   . Smokeless tobacco: Not on file  . Alcohol Use: No  . Drug Use: No  . Sexually Active: Not on file   Other Topics Concern  . Not on file   Social History Narrative  . No narrative on file   No family history on file. Allergies  Allergen Reactions  . Hydrocodone-Acetaminophen     Has some itching on face & nose but not bad enough to stop taking it/ MD aware and instructed patient to take anti-allergy medication prn.   Prior to Admission medications   Medication Sig Start Date End Date Taking? Authorizing Provider  albuterol (PROVENTIL HFA;VENTOLIN HFA) 108 (90 BASE) MCG/ACT inhaler Inhale 2 puffs into the lungs every 4 (four) hours as needed for wheezing.   Yes Historical Provider, MD  chlorpheniramine (CHLOR-TRIMETON) 4 MG tablet Take 4 mg by mouth every 4 (four) hours as needed for allergies or rhinitis (seasonal allergies).    Yes Historical Provider, MD   HYDROcodone-acetaminophen (NORCO) 10-325 MG per tablet Take 1-2 tablets by mouth every 4 (four) hours as needed for pain.   Yes Historical Provider, MD  meloxicam (MOBIC) 15 MG tablet Take 15 mg by mouth daily.   Yes Historical Provider, MD  calcium carbonate (TUMS EX) 750 MG chewable tablet Chew 1 tablet by mouth 2 (two) times daily as needed for heartburn.    Historical Provider, MD     All other systems have been reviewed and were otherwise negative with the exception of those mentioned in the HPI and as above.  Physical Exam: There were no vitals filed for this visit.  General: Alert, no acute distress Cardiovascular: No pedal edema Respiratory: No cyanosis, no use of accessory musculature GI: No organomegaly, abdomen is soft and non-tender Skin: No lesions in the area of chief complaint Neurologic: Sensation intact distally Psychiatric: Patient is competent for consent with normal mood and affect Lymphatic: No axillary or cervical lymphadenopathy  MUSCULOSKELETAL: + SLR on left  Assessment/Plan: Left leg pain Plan for Procedure(s): POSTERIOR LUMBAR DECOMPRESSION AND FUSION 1 LEVEL, L4/5   Emilee Hero, MD 01/28/2013 8:05 AM

## 2013-01-28 NOTE — Progress Notes (Signed)
Dr. Michelle Piper called informed patient's pain remains 10/10 orders received

## 2013-01-29 ENCOUNTER — Encounter (HOSPITAL_COMMUNITY)
Admission: RE | Disposition: A | Discharge status: Other Acute Inpatient Hospital | Payer: Self-pay | Source: Ambulatory Visit | Attending: Orthopedic Surgery

## 2013-01-29 ENCOUNTER — Inpatient Hospital Stay (HOSPITAL_COMMUNITY): Payer: Worker's Compensation | Admitting: Certified Registered"

## 2013-01-29 ENCOUNTER — Inpatient Hospital Stay (HOSPITAL_COMMUNITY): Payer: Worker's Compensation

## 2013-01-29 ENCOUNTER — Encounter (HOSPITAL_COMMUNITY): Payer: Self-pay | Admitting: Certified Registered"

## 2013-01-29 ENCOUNTER — Encounter (HOSPITAL_COMMUNITY): Payer: Self-pay | Admitting: General Practice

## 2013-01-29 SURGERY — POSTERIOR LUMBAR FUSION 1 LEVEL
Anesthesia: General | Site: Spine Lumbar | Wound class: Clean

## 2013-01-29 MED ORDER — LACTATED RINGERS IV SOLN
INTRAVENOUS | Status: DC | PRN
  Administered 2013-01-29 (×2): via INTRAVENOUS

## 2013-01-29 MED ORDER — BISACODYL 5 MG PO TBEC
5.0000 mg | DELAYED_RELEASE_TABLET | Freq: Every day | ORAL | Status: DC | PRN
  Administered 2013-02-02: 5 mg via ORAL
  Filled 2013-01-29: qty 1

## 2013-01-29 MED ORDER — SODIUM CHLORIDE 0.9 % IJ SOLN: 3.0000 mL | INTRAMUSCULAR | Status: DC | PRN

## 2013-01-29 MED ORDER — PHENOL 1.4 % MT LIQD: 1.0000 | OROMUCOSAL | Status: DC | PRN

## 2013-01-29 MED ORDER — ONDANSETRON HCL 4 MG/2ML IJ SOLN: INTRAMUSCULAR | Status: DC | PRN

## 2013-01-29 MED ORDER — OXYCODONE-ACETAMINOPHEN 5-325 MG PO TABS
1.0000 | ORAL_TABLET | ORAL | Status: DC | PRN
  Administered 2013-01-30 – 2013-02-03 (×15): 2 via ORAL
  Filled 2013-01-29 (×15): qty 2

## 2013-01-29 MED ORDER — FENTANYL CITRATE 0.05 MG/ML IJ SOLN
INTRAMUSCULAR | Status: DC | PRN
  Administered 2013-01-29: 50 ug via INTRAVENOUS
  Administered 2013-01-29: 100 ug via INTRAVENOUS
  Administered 2013-01-29: 50 ug via INTRAVENOUS
  Administered 2013-01-29: 150 ug via INTRAVENOUS

## 2013-01-29 MED ORDER — FENTANYL CITRATE 0.05 MG/ML IJ SOLN
25.0000 ug | INTRAMUSCULAR | Status: DC | PRN
  Administered 2013-01-29 (×2): 50 ug via INTRAVENOUS

## 2013-01-29 MED ORDER — 0.9 % SODIUM CHLORIDE (POUR BTL) OPTIME
TOPICAL | Status: DC | PRN
  Administered 2013-01-29: 1000 mL

## 2013-01-29 MED ORDER — SODIUM CHLORIDE 0.9 % IR SOLN
Status: DC | PRN
  Administered 2013-01-29: 3000 mL

## 2013-01-29 MED ORDER — MORPHINE SULFATE (PF) 1 MG/ML IV SOLN
INTRAVENOUS | Status: DC
  Administered 2013-01-29: 17:00:00 via INTRAVENOUS
  Administered 2013-01-30: 9.73 mg via INTRAVENOUS
  Administered 2013-01-30: 6 mg via INTRAVENOUS
  Administered 2013-01-30: 1.5 mg via INTRAVENOUS
  Administered 2013-01-30: 11:00:00 via INTRAVENOUS
  Administered 2013-01-30: 3.89 mg via INTRAVENOUS
  Filled 2013-01-29: qty 25

## 2013-01-29 MED ORDER — SENNOSIDES-DOCUSATE SODIUM 8.6-50 MG PO TABS: 1.0000 | ORAL_TABLET | Freq: Every evening | ORAL | Status: DC | PRN

## 2013-01-29 MED ORDER — SUCCINYLCHOLINE CHLORIDE 20 MG/ML IJ SOLN
INTRAMUSCULAR | Status: DC | PRN
  Administered 2013-01-29: 100 mg via INTRAVENOUS

## 2013-01-29 MED ORDER — FLEET ENEMA 7-19 GM/118ML RE ENEM: 1.0000 | ENEMA | Freq: Once | RECTAL | Status: AC | PRN

## 2013-01-29 MED ORDER — ONDANSETRON HCL 4 MG/2ML IJ SOLN
4.0000 mg | Freq: Four times a day (QID) | INTRAMUSCULAR | Status: DC | PRN
  Filled 2013-01-29: qty 2

## 2013-01-29 MED ORDER — BUPIVACAINE-EPINEPHRINE 0.25% -1:200000 IJ SOLN
INTRAMUSCULAR | Status: DC | PRN
  Administered 2013-01-29: 18 mL

## 2013-01-29 MED ORDER — PROPOFOL INFUSION 10 MG/ML OPTIME
INTRAVENOUS | Status: DC | PRN
  Administered 2013-01-29: 100 ug/kg/min via INTRAVENOUS

## 2013-01-29 MED ORDER — THROMBIN 20000 UNITS EX SOLR
CUTANEOUS | Status: AC
  Filled 2013-01-29: qty 20000

## 2013-01-29 MED ORDER — BUPIVACAINE-EPINEPHRINE PF 0.25-1:200000 % IJ SOLN
INTRAMUSCULAR | Status: AC
  Filled 2013-01-29: qty 30

## 2013-01-29 MED ORDER — ACETAMINOPHEN 325 MG PO TABS
650.0000 mg | ORAL_TABLET | ORAL | Status: DC | PRN
  Administered 2013-01-30: 650 mg via ORAL
  Filled 2013-01-29: qty 2

## 2013-01-29 MED ORDER — SODIUM CHLORIDE 0.9 % IJ SOLN: 9.0000 mL | INTRAMUSCULAR | Status: DC | PRN

## 2013-01-29 MED ORDER — ACETAMINOPHEN 650 MG RE SUPP: 650.0000 mg | RECTAL | Status: DC | PRN

## 2013-01-29 MED ORDER — ALBUTEROL SULFATE HFA 108 (90 BASE) MCG/ACT IN AERS
INHALATION_SPRAY | RESPIRATORY_TRACT | Status: DC | PRN
  Administered 2013-01-29: 2 via RESPIRATORY_TRACT

## 2013-01-29 MED ORDER — DIPHENHYDRAMINE HCL 12.5 MG/5ML PO ELIX: 12.5000 mg | ORAL_SOLUTION | Freq: Four times a day (QID) | ORAL | Status: DC | PRN

## 2013-01-29 MED ORDER — PROPOFOL 10 MG/ML IV BOLUS
INTRAVENOUS | Status: DC | PRN
  Administered 2013-01-29: 180 mg via INTRAVENOUS

## 2013-01-29 MED ORDER — MORPHINE SULFATE (PF) 1 MG/ML IV SOLN
INTRAVENOUS | Status: AC
  Administered 2013-01-29: 9.7 mg via INTRAVENOUS
  Filled 2013-01-29: qty 25

## 2013-01-29 MED ORDER — CEFAZOLIN SODIUM-DEXTROSE 2-3 GM-% IV SOLR
INTRAVENOUS | Status: AC
  Administered 2013-01-29: 2 g via INTRAVENOUS
  Filled 2013-01-29: qty 50

## 2013-01-29 MED ORDER — MIDAZOLAM HCL 5 MG/5ML IJ SOLN
INTRAMUSCULAR | Status: DC | PRN
  Administered 2013-01-29: 2 mg via INTRAVENOUS

## 2013-01-29 MED ORDER — CEFAZOLIN SODIUM 1-5 GM-% IV SOLN
1.0000 g | Freq: Three times a day (TID) | INTRAVENOUS | Status: AC
  Administered 2013-01-29 – 2013-01-30 (×2): 1 g via INTRAVENOUS
  Filled 2013-01-29 (×2): qty 50

## 2013-01-29 MED ORDER — MENTHOL 3 MG MT LOZG: 1.0000 | LOZENGE | OROMUCOSAL | Status: DC | PRN

## 2013-01-29 MED ORDER — NALOXONE HCL 0.4 MG/ML IJ SOLN: 0.4000 mg | INTRAMUSCULAR | Status: DC | PRN

## 2013-01-29 MED ORDER — SODIUM CHLORIDE 0.9 % IV SOLN
INTRAVENOUS | Status: DC
  Administered 2013-01-30: 05:00:00 via INTRAVENOUS

## 2013-01-29 MED ORDER — LIDOCAINE HCL 4 % MT SOLN
OROMUCOSAL | Status: DC | PRN
  Administered 2013-01-29: 4 mL via TOPICAL

## 2013-01-29 MED ORDER — DOCUSATE SODIUM 100 MG PO CAPS
100.0000 mg | ORAL_CAPSULE | Freq: Two times a day (BID) | ORAL | Status: DC
  Administered 2013-01-29 – 2013-02-03 (×10): 100 mg via ORAL
  Filled 2013-01-29 (×10): qty 1

## 2013-01-29 MED ORDER — SODIUM CHLORIDE 0.9 % IJ SOLN
3.0000 mL | Freq: Two times a day (BID) | INTRAMUSCULAR | Status: DC
  Administered 2013-01-30 – 2013-02-02 (×5): 3 mL via INTRAVENOUS

## 2013-01-29 MED ORDER — DIPHENHYDRAMINE HCL 50 MG/ML IJ SOLN: 12.5000 mg | Freq: Four times a day (QID) | INTRAMUSCULAR | Status: DC | PRN

## 2013-01-29 MED ORDER — LIDOCAINE HCL (CARDIAC) 20 MG/ML IV SOLN
INTRAVENOUS | Status: DC | PRN
  Administered 2013-01-29: 60 mg via INTRAVENOUS

## 2013-01-29 MED ORDER — THROMBIN 20000 UNITS EX SOLR
CUTANEOUS | Status: DC | PRN
  Administered 2013-01-29: 15:00:00 via TOPICAL

## 2013-01-29 MED ORDER — ONDANSETRON HCL 4 MG/2ML IJ SOLN
INTRAMUSCULAR | Status: DC | PRN
  Administered 2013-01-29: 4 mg via INTRAVENOUS

## 2013-01-29 MED ORDER — METHOCARBAMOL 100 MG/ML IJ SOLN
500.0000 mg | Freq: Three times a day (TID) | INTRAVENOUS | Status: DC
  Administered 2013-01-29: 500 mg via INTRAVENOUS
  Filled 2013-01-29 (×5): qty 5

## 2013-01-29 MED ORDER — ALUM & MAG HYDROXIDE-SIMETH 200-200-20 MG/5ML PO SUSP: 30.0000 mL | Freq: Four times a day (QID) | ORAL | Status: DC | PRN

## 2013-01-29 MED ORDER — FENTANYL CITRATE 0.05 MG/ML IJ SOLN
INTRAMUSCULAR | Status: AC
  Filled 2013-01-29: qty 2

## 2013-01-29 MED ORDER — MORPHINE SULFATE 2 MG/ML IJ SOLN: 1.0000 mg | INTRAMUSCULAR | Status: DC | PRN

## 2013-01-29 MED ORDER — ONDANSETRON HCL 4 MG/2ML IJ SOLN
4.0000 mg | INTRAMUSCULAR | Status: DC | PRN
  Administered 2013-01-30 – 2013-02-01 (×2): 4 mg via INTRAVENOUS
  Filled 2013-01-29: qty 2

## 2013-01-29 MED ORDER — SODIUM CHLORIDE 0.9 % IV SOLN: 250.0000 mL | INTRAVENOUS | Status: DC

## 2013-01-29 MED ORDER — DEXAMETHASONE SODIUM PHOSPHATE 10 MG/ML IJ SOLN
INTRAMUSCULAR | Status: DC | PRN
  Administered 2013-01-29: 4 mg via INTRAVENOUS

## 2013-01-29 MED ORDER — ZOLPIDEM TARTRATE 5 MG PO TABS: 5.0000 mg | ORAL_TABLET | Freq: Every evening | ORAL | Status: DC | PRN

## 2013-01-29 MED FILL — Sodium Chloride IV Soln 0.9%: INTRAVENOUS | Qty: 1000 | Status: AC

## 2013-01-29 MED FILL — Sodium Chloride Irrigation Soln 0.9%: Qty: 3000 | Status: AC

## 2013-01-29 MED FILL — Heparin Sodium (Porcine) Inj 1000 Unit/ML: INTRAMUSCULAR | Qty: 30 | Status: AC

## 2013-01-29 SURGICAL SUPPLY — 75 items
BENZOIN TINCTURE PRP APPL 2/3 (GAUZE/BANDAGES/DRESSINGS) ×2 IMPLANT
BLADE SURG ROTATE 9660 (MISCELLANEOUS) IMPLANT
BUR ROUND PRECISION 4.0 (BURR) ×2 IMPLANT
CARTRIDGE OIL MAESTRO DRILL (MISCELLANEOUS) ×1 IMPLANT
CLOTH BEACON ORANGE TIMEOUT ST (SAFETY) ×2 IMPLANT
CLSR STERI-STRIP ANTIMIC 1/2X4 (GAUZE/BANDAGES/DRESSINGS) ×2 IMPLANT
CONT SPEC STER OR (MISCELLANEOUS) IMPLANT
CORDS BIPOLAR (ELECTRODE) ×2 IMPLANT
COVER SURGICAL LIGHT HANDLE (MISCELLANEOUS) ×2 IMPLANT
DERMABOND ADVANCED (GAUZE/BANDAGES/DRESSINGS) ×1
DERMABOND ADVANCED .7 DNX12 (GAUZE/BANDAGES/DRESSINGS) ×1 IMPLANT
DIFFUSER DRILL AIR PNEUMATIC (MISCELLANEOUS) ×2 IMPLANT
DRAIN CHANNEL 15F RND FF W/TCR (WOUND CARE) ×2 IMPLANT
DRAPE C-ARM 42X72 X-RAY (DRAPES) ×2 IMPLANT
DRAPE ORTHO SPLIT 77X108 STRL (DRAPES) ×1
DRAPE POUCH INSTRU U-SHP 10X18 (DRAPES) ×2 IMPLANT
DRAPE SURG 17X23 STRL (DRAPES) ×6 IMPLANT
DRAPE SURG ORHT 6 SPLT 77X108 (DRAPES) ×1 IMPLANT
DURAPREP 26ML APPLICATOR (WOUND CARE) ×2 IMPLANT
ELECT BLADE 4.0 EZ CLEAN MEGAD (MISCELLANEOUS) ×2
ELECT CAUTERY BLADE 6.4 (BLADE) ×2 IMPLANT
ELECT REM PT RETURN 9FT ADLT (ELECTROSURGICAL) ×2
ELECTRODE BLDE 4.0 EZ CLN MEGD (MISCELLANEOUS) ×1 IMPLANT
ELECTRODE REM PT RTRN 9FT ADLT (ELECTROSURGICAL) ×1 IMPLANT
EVACUATOR SILICONE 100CC (DRAIN) ×2 IMPLANT
GAUZE SPONGE 4X4 16PLY XRAY LF (GAUZE/BANDAGES/DRESSINGS) ×8 IMPLANT
GLOVE BIO SURGEON STRL SZ7 (GLOVE) ×2 IMPLANT
GLOVE BIO SURGEON STRL SZ8 (GLOVE) ×2 IMPLANT
GLOVE BIOGEL PI IND STRL 7.5 (GLOVE) ×1 IMPLANT
GLOVE BIOGEL PI IND STRL 8 (GLOVE) ×1 IMPLANT
GLOVE BIOGEL PI INDICATOR 7.5 (GLOVE) ×1
GLOVE BIOGEL PI INDICATOR 8 (GLOVE) ×1
GOWN STRL NON-REIN LRG LVL3 (GOWN DISPOSABLE) ×4 IMPLANT
GOWN STRL REIN XL XLG (GOWN DISPOSABLE) ×4 IMPLANT
HANDPIECE INTERPULSE COAX TIP (DISPOSABLE) ×1
IV CATH 14GX2 1/4 (CATHETERS) ×2 IMPLANT
KIT BASIN OR (CUSTOM PROCEDURE TRAY) ×2 IMPLANT
KIT POSITION SURG JACKSON T1 (MISCELLANEOUS) ×2 IMPLANT
KIT ROOM TURNOVER OR (KITS) ×2 IMPLANT
MARKER SKIN DUAL TIP RULER LAB (MISCELLANEOUS) ×2 IMPLANT
NEEDLE BONE MARROW 8GX6 FENEST (NEEDLE) IMPLANT
NEEDLE HYPO 25GX1X1/2 BEV (NEEDLE) ×2 IMPLANT
NEEDLE SPNL 18GX3.5 QUINCKE PK (NEEDLE) ×4 IMPLANT
NS IRRIG 1000ML POUR BTL (IV SOLUTION) ×2 IMPLANT
OIL CARTRIDGE MAESTRO DRILL (MISCELLANEOUS) ×2
PACK LAMINECTOMY ORTHO (CUSTOM PROCEDURE TRAY) ×2 IMPLANT
PACK UNIVERSAL I (CUSTOM PROCEDURE TRAY) ×2 IMPLANT
PAD ARMBOARD 7.5X6 YLW CONV (MISCELLANEOUS) ×4 IMPLANT
PATTIES SURGICAL .5 X1 (DISPOSABLE) ×2 IMPLANT
PATTIES SURGICAL .5X1.5 (GAUZE/BANDAGES/DRESSINGS) IMPLANT
ROD PRE BENT EXPEDIUM 35MM (Rod) ×2 IMPLANT
SCREW EXPEDIUM POLYAXIAL 7X40M (Screw) ×2 IMPLANT
SCREW SET SINGLE INNER (Screw) ×4 IMPLANT
SET HNDPC FAN SPRY TIP SCT (DISPOSABLE) ×1 IMPLANT
SPONGE GAUZE 4X4 12PLY (GAUZE/BANDAGES/DRESSINGS) ×2 IMPLANT
SPONGE INTESTINAL PEANUT (DISPOSABLE) IMPLANT
SPONGE SURGIFOAM ABS GEL 100 (HEMOSTASIS) ×2 IMPLANT
STRIP CLOSURE SKIN 1/2X4 (GAUZE/BANDAGES/DRESSINGS) ×4 IMPLANT
SURGIFLO TRUKIT (HEMOSTASIS) IMPLANT
SUT ETHILON 2 0 FS 18 (SUTURE) ×2 IMPLANT
SUT MNCRL AB 4-0 PS2 18 (SUTURE) ×2 IMPLANT
SUT VIC AB 0 CT1 18XCR BRD 8 (SUTURE) IMPLANT
SUT VIC AB 0 CT1 8-18 (SUTURE)
SUT VIC AB 1 CT1 18XCR BRD 8 (SUTURE) ×2 IMPLANT
SUT VIC AB 1 CT1 8-18 (SUTURE) ×2
SUT VIC AB 2-0 CT2 18 VCP726D (SUTURE) ×4 IMPLANT
SYR 20CC LL (SYRINGE) ×2 IMPLANT
SYR BULB IRRIGATION 50ML (SYRINGE) ×2 IMPLANT
SYR CONTROL 10ML LL (SYRINGE) ×2 IMPLANT
TAPE CLOTH SURG 4X10 WHT LF (GAUZE/BANDAGES/DRESSINGS) ×2 IMPLANT
TOWEL OR 17X24 6PK STRL BLUE (TOWEL DISPOSABLE) ×2 IMPLANT
TOWEL OR 17X26 10 PK STRL BLUE (TOWEL DISPOSABLE) ×2 IMPLANT
TRAY FOLEY CATH 14FR (SET/KITS/TRAYS/PACK) ×2 IMPLANT
WATER STERILE IRR 1000ML POUR (IV SOLUTION) ×6 IMPLANT
YANKAUER SUCT BULB TIP NO VENT (SUCTIONS) ×2 IMPLANT

## 2013-01-29 NOTE — Progress Notes (Signed)
Patient doing well. + and expected low back pain. Patient reports resolution of left leg pain. Reports right leg numbness and cramping. States that left leg feels.  BP 125/69  Pulse 84  Temp(Src) 99.2 F (37.3 C) (Oral)  Resp 18  Ht 5' 4.8" (1.646 m)  Wt 90.8 kg (200 lb 2.8 oz)  BMI 33.51 kg/m2  SpO2 100%  NVI Dressing CDI  POD #1 after revision decompression and interbody L4/5 fusion, with new and unexplained right leg pain.  - will order stat CT scan of L spine to evaluate instrumentation - up with PT/OT today with brace after CT scan - d/c PCA, start percocet and valium - SCDs

## 2013-01-29 NOTE — Anesthesia Procedure Notes (Addendum)
Procedure Name: Intubation Date/Time: 01/29/2013 2:24 PM Performed by: Ellin Goodie Pre-anesthesia Checklist: Patient identified, Emergency Drugs available, Suction available, Patient being monitored and Timeout performed Patient Re-evaluated:Patient Re-evaluated prior to inductionOxygen Delivery Method: Circle system utilized Preoxygenation: Pre-oxygenation with 100% oxygen Intubation Type: IV induction and Rapid sequence Ventilation: Mask ventilation without difficulty Laryngoscope Size: Mac and 4 Grade View: Grade I Tube type: Oral Tube size: 7.5 mm Number of attempts: 1 Airway Equipment and Method: Stylet,  LTA kit utilized and Bite block Placement Confirmation: ETT inserted through vocal cords under direct vision,  positive ETCO2 and breath sounds checked- equal and bilateral Secured at: 22 cm Tube secured with: Tape Dental Injury: Teeth and Oropharynx as per pre-operative assessment  Comments: Easy atraumatic intubation x1  with MAC 4 blade.  Soft bite block in place. Placement of ETT verfied by Dr. Randa Evens.  Carlynn Herald, CRNA   Performed by: Alben Spittle, Hurshel Keys

## 2013-01-29 NOTE — Op Note (Signed)
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  PROCEDURES: 1. Revision left-sided L4-5 decompression with removal of left-sided     L4-5 disk fragment causing compression of the traversing L5 nerve     on the left. 2. Left-sided transforaminal lumbar interbody fusion. 3. Right-sided L4-5 posterolateral fusion. 4. Insertion of interbody device x1 (9 x 27-mm Concorde bulleted     cage). 5. Intraoperative use of fluoroscopy. 6. Use of local autograft. 7. Bone marrow aspiration from the patient's right iliac crest using a     separate incision.  SURGEON:  Estill Bamberg, MD  ASSISTANTS:  Jason Coop, PA-C  ANESTHESIA:  General endotracheal anesthesia.  COMPLICATIONS:  None.  DISPOSITION:  Stable.  ESTIMATED BLOOD LOSS:  300 mL.  INDICATIONS FOR PROCEDURE:  Briefly, Ms. [name] is a pleasant 32 year old female, who is status post a left-sided L4-5 microdiskectomy.  The patient initially did well, however did have recurrence of her pain in her postoperative.  An MRI did reveal recurrent left-sided L4-5 disk herniation.  The patient did have rather severe pain.  Of note, she did have left-sided L5 nerve block, which did entirely alleviate her pain temporarily.  Given her ongoing pain and discomfort, we did have a discussion regarding going forward with a left side a revision decompression with a left-sided  transforaminal lumbar interbody fusion with the contralateral right-sided posterolateral fusion.  The patient fully understood the risks and limitations of the procedure as outlined in my preoperative note.  OPERATIVE DETAILS:  On Jan 28, 2013, the patient was brought to surgery and general endotracheal anesthesia was administered.  The patient was placed prone on a well-padded flat Jackson bed with Wilson frame. Antibiotics were given.  Time-out procedure was performed.  The back was prepped and draped in the usual sterile fashion.  I then re-utilized the patient's previous incision, which was extended both cephalad and caudad.  The paraspinal musculature was bluntly swept laterally on the right and the left sides.  The lamina of L4 and L5 were suppressed, exposed, as were the L4-5 facet joints.  The transverse processes were also subperiosteally exposed bilaterally.  On the right side, I did use anatomic landmarks to cannulate the L4 and L5 pedicles using a 4-mm bur followed by a gearshift probe followed by a 6-mm tap.  I did use a ball- tip probe to confirm there was no cortical violation.  A 7 x 45 mm screws were placed on the right.  A 35-mm rod was also placed. Distraction was applied across the rod and caps were placed, followed by provisional tightening.  On the left side, the pedicles were cannulated in  the manner previously described.  Bone wax was placed into place.  I then went forward with a full facetectomy on the left.  There was noted to be significant epidural scar, which was expected, as the patient did have a previous decompression in this area.  There was noted to be a prominent protruding disk fragment occupying the lateral recess on the left side.  The fragment was very adherent to the dura and the traversing L5 nerve. I was however able to meticulously free the fragment from the surrounding structures and adequately decompress the L5 nerve. The fragment was removed in  2 large pieces.  I then continued to mobilize the traversing L5 nerve medially.  I then used a 15-blade knife to perform an annulotomy at the posterolateral aspect of the anulus on the left side.  I then used a series of curettes and pituitaries to perform a thorough and complete diskectomy.  I then prepared the endplates adequately.  I then placed a series of trials and I did feel that a 9-mm trial be the most appropriate fit.  At this point, I did use a 15-blade knife to perform assuming to make an incision over the patient's right posterior iliac crest.  Total of 8 mL of bone marrow aspirate was harvested from the patient's right iliac crest.  This was mixed with 10 mL of Vitoss BA.  The Vitoss BA mixture was mixed with the autograft obtained from removing the facet joint on the left side.  I then liberally packed the intervertebral space with the bone graft mixture.  The interbody device was also packed with bone graft mixture. The interbody device was then tamped into position.  I was very pleased with the press fit of the implant.  The positioning of the implant was assessed using AP and lateral fluoroscopy and I was very pleased with the appearance of the implant.  I then placed 7 x 40 mm screw on the left L5 and 7 x 45 mm screw on the left L4.  Again, 35-mm rod was placed.  Distraction was discontinued on the contralateral side.  I then placed caps and then a final locking procedure was performed bilaterally.  I was very pleased with the AP and lateral fluoroscopic images.  I then turned my attention towards the patient's right side.  I then used a 4 mm high-speed bur to decorticate the posterior elements and transverse processes.  The bone graft mixture was packed into the posterolateral gutter and along the posterior elements on the right side.  Of note, the wound was copiously irrigated with approximately 2 L normal saline throughout the procedure.  The retractors were  relaxed approximately every 30 minutes throughout the procedure as well.  All epidural bleeding was controlled using bipolar electrocautery in addition to Surgiflo.  I then closed the fascia using #1 Vicryl.  The deep subcutaneous layer was closed using 0 Vicryl and the superficial subcutaneous layer was closed using 2-0 Vicryl.  The skin was then closed using 3-0 Monocryl.  Benzoin and Steri-Strips were applied followed by sterile dressing.  All instrument counts were correct at the termination of the procedure.  Of note, I did use neurologic monitoring throughout the procedure and there was no abnormal EMG activity noted throughout surgery.  I did test the screws on the left side using triggered EMG and there was no screw that tested below 15 milliamps.  Of note, Jason Coop was my assistant throughout the entirety of the procedure and aided  in essential retraction and suctioning needed throughout the surgery.     Estill Bamberg, MD     MD/MEDQ  D:  01/28/2013  T:  01/29/2013  Job:  [id number]

## 2013-01-29 NOTE — Progress Notes (Signed)
I reviewed patient's STAT CT scan of L spine. It appears possible that the right L5 screw may be causing irritation of the traversing L5 nerve, and this may be causing the patient right leg symptoms that we discussed this morning. I did discuss this with the patient. Plan is to revise the right L5 screw, in order to lessen any irritation it may be causing to the right L5 nerve. Risks and benefits discussed in detail with the patient. Plan is proceed with revision instrumentation later today.

## 2013-01-29 NOTE — Progress Notes (Signed)
Spoke with Dr. Yevette Edwards, pt complains about numbness in both calfs moves 2 + T C states she feels so much better and does not have pain like yesterday. Pt informed Dr. Yevette Edwards said he is not concerned about the numbness in her calves per Dr. Yevette Edwards.

## 2013-01-29 NOTE — Progress Notes (Signed)
Patient is now s/p revision of right L5 screw to a more lateral position. Of note, there was no abnormal EMG activity noted throughout the surgery, nor was there any prior to surgery or after. Prior to revising the screw, it tested at 25, and it tested at 31 after revising it. The reading monitoring physician felt that based on the lack of abnormal EMG, in combination with the fact that the screw tested at 25 prior to revision, it was unlikely that the screw was the cause of the patient's postoperative right leg pain. However, I am currently awaiting patient to awaken from anesthesia, and am hopeful that her right leg pain has resolved. Will await patient's arrival to PACU for an additional evaluation.

## 2013-01-29 NOTE — Progress Notes (Signed)
Dr. Yevette Edwards at bedside spoke with patient regarding surgery

## 2013-01-29 NOTE — Anesthesia Preprocedure Evaluation (Addendum)
Anesthesia Evaluation  Patient identified by MRN, date of birth, ID band Patient awake    Reviewed: Allergy & Precautions, H&P , NPO status , Patient's Chart, lab work & pertinent test results, reviewed documented beta blocker date and time   Airway Mallampati: I TM Distance: >3 FB     Dental  (+) Dental Advisory Given and Teeth Intact   Pulmonary asthma ,    + wheezing      Cardiovascular negative cardio ROS  Rhythm:Regular     Neuro/Psych  Headaches, Anxiety Depression    GI/Hepatic Neg liver ROS, GERD-  Medicated,  Endo/Other  negative endocrine ROS  Renal/GU negative Renal ROS     Musculoskeletal negative musculoskeletal ROS (+)   Abdominal (+)  Abdomen: soft. Bowel sounds: normal.  Peds  Hematology negative hematology ROS (+)   Anesthesia Other Findings   Reproductive/Obstetrics                        Anesthesia Physical Anesthesia Plan  ASA: II  Anesthesia Plan: General   Post-op Pain Management:    Induction: Intravenous  Airway Management Planned: Oral ETT  Additional Equipment:   Intra-op Plan:   Post-operative Plan: Extubation in OR  Informed Consent: I have reviewed the patients History and Physical, chart, labs and discussed the procedure including the risks, benefits and alternatives for the proposed anesthesia with the patient or authorized representative who has indicated his/her understanding and acceptance.   Dental advisory given  Plan Discussed with: CRNA, Anesthesiologist and Surgeon  Anesthesia Plan Comments:        Anesthesia Quick Evaluation

## 2013-01-29 NOTE — Anesthesia Postprocedure Evaluation (Signed)
  Anesthesia Post-op Note  Patient: [name]  Procedure(s) Performed: Procedure(s): REVISION POSTERIOR LUMBAR FUSION 1 LEVEL (N/A)  Patient Location: PACU  Anesthesia Type:General  Level of Consciousness: awake  Airway and Oxygen Therapy: Patient Spontanous Breathing  Post-op Pain: mild  Post-op Assessment: Post-op Vital signs reviewed  Post-op Vital Signs: Reviewed  Complications: No apparent anesthesia complications

## 2013-01-29 NOTE — Transfer of Care (Signed)
Immediate Anesthesia Transfer of Care Note  Patient: [name]  Procedure(s) Performed: Procedure(s): REVISION POSTERIOR LUMBAR FUSION 1 LEVEL (N/A)  Patient Location: PACU  Anesthesia Type:General  Level of Consciousness: awake, alert  and oriented  Airway & Oxygen Therapy: Patient Spontanous Breathing  Post-op Assessment: Report given to PACU RN  Post vital signs: stable  Complications: No apparent anesthesia complications

## 2013-01-29 NOTE — Progress Notes (Signed)
UR COMPLETED  

## 2013-01-30 MED ORDER — DIAZEPAM 5 MG PO TABS
5.0000 mg | ORAL_TABLET | Freq: Four times a day (QID) | ORAL | Status: DC | PRN
  Administered 2013-01-30 – 2013-02-03 (×10): 5 mg via ORAL
  Filled 2013-01-30 (×10): qty 1

## 2013-01-30 NOTE — Progress Notes (Signed)
Orthopedic Tech Progress Note Patient Details:  [name] [birth year] [id number]  Patient ID: [name], female   DOB: 07-18-1981, 32 y.o.   MRN: [id number]   [name] 01/30/2013, 3:57 PM TLSO COMPLETED BY [facility].

## 2013-01-30 NOTE — Progress Notes (Signed)
PO day #2 after L L4-5 TLIF for L leg px and PO Day #1 after removal and reinsertion of R L5 pedicle screw, doing well with resolution of L leg pain after day 1 and resolved R leg symptoms today. Pt reports no pain in her legs, she has some mild numbness in her L>R lateral thighs that is not overly bothersome to her. She reports her pain as being much improved today compared to yesterday. She has the expected LBP and has not yet had PT today. She is smiling today.   BP 125/64  Pulse 55  Temp(Src) 97.8 F (36.6 C) (Oral)  Resp 17  Ht 5' 4.8" (1.646 m)  Wt 90.8 kg (200 lb 2.8 oz)  BMI 33.51 kg/m2  SpO2 98%  LMP 12/01/2012 Dressing CDI, Drain in place with output of 20 over last 12 hours, drain removed without difficulty. SCD's in place. Sensation intact BIL LEs, neurovascularly intact  PO day #2 after L L4-5 TLIF for L leg px & PO Day #1 after removal and reinsertion of R L5 pedicle screw   - L leg pain continues to be resolved  - R leg symptoms resolved today  - Expected LBP well controlled at this time  - Drain removed today  - PT/OT today   - TLSO brace at all times when OOB   - Back precautions  - D/C PCA today and transition to oral meds percocet and valium   - Cont SCD's  - Likely D/C home Sat vs Sun pending PT/OT   - Written prescriptions for Percocet and Valium in the pts chart   - F/U appt card in chart with D/C instructions

## 2013-01-30 NOTE — Progress Notes (Signed)
Rehab Admissions Coordinator Note:  Patient was screened by Brock Ra for appropriateness for an Inpatient Acute Rehab Consult.  At this time, we are recommending Inpatient Rehab consult.  Brock Ra 01/30/2013, 2:20 PM  I can be reached at [phone].

## 2013-01-30 NOTE — Evaluation (Signed)
Physical Therapy Evaluation Patient Details Name: [name] MRN: [id number] DOB: 08/26/1981 Today's Date: 01/30/2013 Time: 4098-1191 PT Time Calculation (min): 37 min  PT Assessment / Plan / Recommendation Clinical Impression  Pt is a 32 y.o. female s/p posterior lumbar fusion and revision of fusion L4-L5. Pt limited in therapy today due to pain and inability to wear spinal brace. The TLSO brace LE piece is locked into extension; PA notified. Pt is highly motivated to participate and return to [facility] (prior to May 2013 fall at work) in order to take care of her children. Presents with deficits in mobility, decreased indpendence with transfers and gt due to pain and back precautions. Would be a good candidate for CIR to increase independence and decrease burden on family. Pt has good family support. Will require 24hour supervision at home if CIR is not an option.     PT Assessment  Patient needs continued PT services    Follow Up Recommendations  CIR    Does the patient have the potential to tolerate intense rehabilitation      Barriers to Discharge Inaccessible home environment pt states she is unable to live on main floor of apartment; would need to go up 12 steps    Equipment Recommendations  None recommended by PT (will determine closer to D/C date; pending progress )    Recommendations for Other Services     Frequency Min 5X/week    Precautions / Restrictions Precautions Precautions: Back;Fall Precaution Booklet Issued: Yes (comment) Precaution Comments: given handout and educated on back precautions  Required Braces or Orthoses: Spinal Brace Spinal Brace: Thoracolumbosacral orthotic Restrictions Weight Bearing Restrictions: No   Pertinent Vitals/Pain 9/10 primarily in R LE; c/o "sharp/shooting pain" intermittently; c/o numbness in R LE; uses PCA PRN       Mobility  Bed Mobility Bed Mobility: Rolling Right;Right Sidelying to Sit;Sit to Sidelying Right Rolling Right:  5: Set up;With rail Right Sidelying to Sit: 4: Min assist;With rails;HOB elevated Sit to Sidelying Right: HOB elevated;4: Min assist Details for Bed Mobility Assistance: constant cues for sequencing and hand placement; requires increased time due to increased pain; (A) to advance LEs onto/off bed and to maintain back precautions and adhere back precautions Transfers Transfers: Sit to Stand;Stand to Sit;Stand Pivot Transfers Sit to Stand: 3: Mod assist;From bed;From elevated surface;With upper extremity assist Stand to Sit: To chair/3-in-1;With upper extremity assist;4: Min assist;With armrests Stand Pivot Transfers: 3: Mod assist Details for Transfer Assistance: multimodal cues for sequencing and hand placement to adhere to back precautions; requires assistance to stand due to numbness and pain in R LE Ambulation/Gait Ambulation/Gait Assistance: Not tested (comment) Stairs: No Wheelchair Mobility Wheelchair Mobility: No    Exercises     PT Diagnosis: Difficulty walking;Acute pain  PT Problem List: Decreased range of motion;Decreased strength;Decreased activity tolerance;Decreased balance;Decreased mobility;Decreased knowledge of use of DME;Decreased safety awareness;Decreased knowledge of precautions;Pain PT Treatment Interventions: Gait training;Stair training;DME instruction;Functional mobility training;Therapeutic activities;Balance training;Neuromuscular re-education;Patient/family education   PT Goals Acute Rehab PT Goals PT Goal Formulation: With patient Time For Goal Achievement: 02/06/13 Potential to Achieve Goals: Good Pt will Roll Supine to Right Side: with modified independence PT Goal: Rolling Supine to Right Side - Progress: Goal set today Pt will go Sit to Supine/Side: with modified independence PT Goal: Sit to Supine/Side - Progress: Goal set today Pt will go Stand to Sit: with modified independence PT Goal: Stand to Sit - Progress: Goal set today Pt will Ambulate:  >150 feet;with least restrictive  assistive device;with supervision PT Goal: Ambulate - Progress: Goal set today Pt will Go Up / Down Stairs: 3-5 stairs;with min assist;with rail(s);with least restrictive assistive device PT Goal: Up/Down Stairs - Progress: Goal set today Additional Goals Additional Goal #1: Pt to be able to indpendently recall and demo understanding of 3/3 back precautions. PT Goal: Additional Goal #1 - Progress: Goal set today  Visit Information  Last PT Received On: 01/30/13 Assistance Needed: +1    Subjective Data  Subjective: "i would really like to walk to the bathroom and try to go" agreeable to therapy  Patient Stated Goal: to be able to take care of my kids    Prior Functioning  Home Living Lives With: Family;Other (Comment) (has childern ages 20,5,10,14) Available Help at Discharge: Family;Available PRN/intermittently;Other (Comment) (husband works at least 8 hours/day) Type of Home: Apartment Home Access: Level entry Home Layout: Two level;Bed/bath upstairs Alternate Level Stairs-Number of Steps: 12 Alternate Level Stairs-Rails: Right Bathroom Shower/Tub: Tub/shower unit;Curtain Firefighter:  (has 3 in 1 she uses primarily ) Bathroom Accessibility: Yes How Accessible: Accessible via walker Home Adaptive Equipment: Bedside commode/3-in-1;Walker - rolling Prior Function Level of Independence: Needs assistance Needs Assistance: Dressing;Bathing;Light Housekeeping;Toileting;Meal Prep;Gait;Transfers Bath: Maximal Dressing: Maximal Toileting: Other (comment) (states she can transfer to 3 in 1) Meal Prep: Total Light Housekeeping: Total Gait Assistance: requires assistance for longer distances and RW  Transfer Assistance: states husband or son helps her get in/out of bed. Able to Take Stairs?: Yes (with assistance ) Driving: No Vocation: On disability Communication Communication: No difficulties    Cognition  Cognition Arousal/Alertness:  Awake/alert Behavior During Therapy: WFL for tasks assessed/performed Overall Cognitive Status: Within Functional Limits for tasks assessed    Extremity/Trunk Assessment Right Lower Extremity Assessment RLE ROM/Strength/Tone: Unable to fully assess;Due to pain (able to perform LAQ; cannot tolerate resistance) RLE Sensation:  (states her R LE feels "numb") Left Lower Extremity Assessment LLE ROM/Strength/Tone: WFL for tasks assessed LLE Sensation: WFL - Light Touch Trunk Assessment Trunk Assessment: Normal   Balance Balance Balance Assessed: Yes Static Sitting Balance Static Sitting - Balance Support: Bilateral upper extremity supported;Feet unsupported Static Sitting - Level of Assistance: 5: Stand by assistance Static Sitting - Comment/# of Minutes: tolerated sitting EOB ~10 min  End of Session PT - End of Session Equipment Utilized During Treatment: Gait belt;Other (comment) (unable to use back brace: PA/RN and ortho tech notified) Activity Tolerance: Patient limited by pain Patient left: in bed;with call bell/phone within reach Nurse Communication: Mobility status;Other (comment) (Spinal Brace malfunction)  GP     Donell Sievert, Pt [phone] 01/30/2013, 1:05 PM

## 2013-01-30 NOTE — Op Note (Signed)
[name], [name]               ACCOUNT NO.:  [id number]  MEDICAL RECORD NO.:  [id number]  LOCATION:  5N01C                        FACILITY:  [facility]  PHYSICIAN:  Estill Bamberg, MD      DATE OF BIRTH:  [birth year]  DATE OF PROCEDURE:  01/29/2013 DATE OF DISCHARGE:                              OPERATIVE REPORT   PREOPERATIVE DIAGNOSIS:  Right leg pain, etiology unclear, possibly related to right L5 pedicle screw irritating right L5 nerve.  POSTOPERATIVE DIAGNOSIS:  Right leg pain, etiology unclear, possibly related to right L5 pedicle screw irritating right L5 nerve.  PROCEDURE:  Removal of right L5 pedicle screw with reinsertion of right L5 pedicle screw to a more lateral position.  SURGEON:  Estill Bamberg, MD  ASSISTANT:  Jason Coop, PA-C  ANESTHESIA:  General endotracheal anesthesia.  COMPLICATIONS:  None.  DISPOSITION:  Stable.  ESTIMATED BLOOD LOSS:  Minimal.  INDICATIONS FOR PROCEDURE:  Briefly, [name] is a pleasant 32 year old female who is 1 day status post a left-sided transforaminal lumbar interbody fusion.  Of note, pedicle screws were placed at L4 and L5.  On the morning of postop day #1, the patient was evaluated by me.  She did report that her previous left leg pain was entirely resolved.  However, she did report a new onset of pain in her right leg.  The distribution of her pain was non-dermatomal, and she did express to me that it involved her low back, her groin, as well as a posterior thigh and calf. I was uncertain as to whether this may be related to the patient's hardware and I did subsequently order a CAT scan of the patient's lumbar spine.  The CT scan did reveal a slightly medially deviated right L5 screw.  I was uncertain as to whether or not this was causing the patient's leg symptoms, however, I did feel that it was indicated to revise the right L5 pedicle screw, given her leg pain.  The patient understood the risks and limitations of the  procedure.  OPERATIVE DETAILS:  On Jan 29, 2013, the patient was brought to the surgery and general endotracheal anesthesia was administered.  The patient was placed prone on a Jackson table with spinal frame. Antibiotics were given.  The back was prepped and draped in the usual sterile fashion.  I then realized the patient's previous incision.  The previous sutures were identified and removed.  I then turned my attention towards the hardware.  The caps were previously placed were removed.  Of note, prior to removing the L5 screw, I did use triggered EMG and I did test right L5 screw.  The screw was specifically tested at 25 milliamps.  This did strongly suggest that the right screw was not directly in contact with the traversing L5 nerve or any other lumbar nerve.  However, given the patient's preoperative CAT scan, I did decide to remove the screw.  I did reposition the L5 screw more laterally to ensure that it location further lateral in the L5 nerve.  Then, retested the screw and the screw tested at 31 milliamps.  A 35-mm rod was placed, connecting the right L4 and L5 screws and caps  were placed and a final locking procedure was performed.  I then used pulsatile irrigation and did thoroughly irrigate the superficial and deep wound.  The fascia was then closed using #1 Vicryl.  The subcutaneous layer was closed using 2- 0 Vicryl and the skin was closed using 3-0 Monocryl.  Benzoin and Steri- Strips were applied followed by sterile dressing.  Of note, a deep Blake drain was placed prior to closing the fascia.  Also of note, as reflected above, I did use neurologic monitoring throughout the procedure.  It was very clear per my discussion with the Monitoring Team both preoperatively, throughout the procedure, and postoperatively, that there was no abnormal EMG activity encountered at any point throughout the duration of the procedure, nor was there any abnormal EMG activity prior to the  procedure or after.  Also of note, it was the neurologic monitoring technician's opinion, and she did confer with her "reading physician," that the right L5 screw was unlikely to be causing the patient's right leg pain. However, I did feel that the  screw may have been related to the patient's new onset right leg pain.  In any event, the plan postoperatively is to evaluate the patient once again, in the hopes that this does alleviate her right leg pain.  Of note, Jason Coop was my assistant throughout the procedure and aided in essential retraction and suctioning needed throughout the surgery.     Estill Bamberg, MD     MD/MEDQ  D:  01/29/2013  T:  01/30/2013  Job:  [id number]

## 2013-01-30 NOTE — Evaluation (Signed)
Occupational Therapy Evaluation Patient Details Name: [name] MRN: [id number] DOB: 08-06-1981 Today's Date: 01/30/2013 Time: 1042-1100 OT Time Calculation (min): 18 min  OT Assessment / Plan / Recommendation Clinical Impression  Pt  s/p posterior lumbar fusion and revision of fusion L4-L5. Pt limited by pain this session.  Also, pt's TLSO brace leg attachment is locked into extension, therefore limiting functional transfers. Have received permission from PA (after completing eval) to remove leg attachment and allow pt to wear back brace only. Recommending CIR to further progress rehab before eventual return home.    OT Assessment  Patient needs continued OT Services    Follow Up Recommendations  CIR    Barriers to Discharge      Equipment Recommendations  3 in 1 bedside comode    Recommendations for Other Services Rehab consult  Frequency  Min 2X/week    Precautions / Restrictions Precautions Precautions: Back;Fall Precaution Booklet Issued: Yes (comment) Precaution Comments: given handout and educated on back precautions  Required Braces or Orthoses: Spinal Brace Spinal Brace: Thoracolumbosacral orthotic Restrictions Weight Bearing Restrictions: No   Pertinent Vitals/Pain See vitals    ADL  Grooming: Simulated;Set up Where Assessed - Grooming: Supine, head of bed up Upper Body Bathing: Simulated;Minimal assistance Where Assessed - Upper Body Bathing: Supine, head of bed up Lower Body Bathing: Simulated;+1 Total assistance Where Assessed - Lower Body Bathing: Rolling right and/or left;Supine, head of bed flat Upper Body Dressing: Simulated;Moderate assistance Where Assessed - Upper Body Dressing: Supine, head of bed up Lower Body Dressing: Simulated;+1 Total assistance Where Assessed - Lower Body Dressing: Supine, head of bed flat;Rolling right and/or left Transfers/Ambulation Related to ADLs: not attempted, pt too painful ADL Comments: Assisted pt with  repositioning in bed (rolling L/R sides). The leg attachment on pt's TLSO brace is locked into extension.  Spoke with PA Jason Coop) after completing eval, and she gave PT/OT persmission to take off leg attachment and mobilize pt with just the brack brace while maintaining back precautions.    OT Diagnosis: Generalized weakness;Acute pain  OT Problem List: Decreased strength;Decreased activity tolerance;Decreased knowledge of use of DME or AE;Decreased knowledge of precautions;Pain OT Treatment Interventions: Self-care/ADL training;DME and/or AE instruction;Therapeutic activities;Patient/family education   OT Goals Acute Rehab OT Goals OT Goal Formulation: With patient Time For Goal Achievement: 02/13/13 Potential to Achieve Goals: Good ADL Goals Pt Will Perform Grooming: with supervision;Standing at sink ADL Goal: Grooming - Progress: Goal set today Pt Will Perform Upper Body Bathing: Sitting, chair;Sitting, edge of bed;with set-up ADL Goal: Upper Body Bathing - Progress: Goal set today Pt Will Perform Lower Body Bathing: with supervision;Sit to stand from chair;Sit to stand from bed;with adaptive equipment ADL Goal: Lower Body Bathing - Progress: Goal set today Pt Will Perform Upper Body Dressing: with supervision;Sitting, chair;Sitting, bed ADL Goal: Upper Body Dressing - Progress: Goal set today Pt Will Perform Lower Body Dressing: with supervision;Sit to stand from chair;Sit to stand from bed;with adaptive equipment ADL Goal: Lower Body Dressing - Progress: Goal set today Pt Will Transfer to Toilet: with supervision;Ambulation;with DME;Comfort height toilet;Maintaining back safety precautions ADL Goal: Toilet Transfer - Progress: Goal set today Pt Will Perform Toileting - Clothing Manipulation: with supervision;Standing;Sitting on 3-in-1 or toilet ADL Goal: Toileting - Clothing Manipulation - Progress: Goal set today Pt Will Perform Toileting - Hygiene: with supervision;Sit to  stand from 3-in-1/toilet ADL Goal: Toileting - Hygiene - Progress: Goal set today Miscellaneous OT Goals Miscellaneous OT Goal #1: Pt will perform bed mobility  at supervision level as precursor for EOB ADLs. OT Goal: Miscellaneous Goal #1 - Progress: Goal set today  Visit Information  Last OT Received On: 01/30/13 Assistance Needed: +1    Subjective Data      Prior Functioning     Home Living Lives With: Family;Other (Comment) (has childern ages 1,5,10,14) Available Help at Discharge: Family;Available PRN/intermittently;Other (Comment) (husband works at least 8 hours/day) Type of Home: Apartment Home Access: Level entry Home Layout: Two level;Bed/bath upstairs Alternate Level Stairs-Number of Steps: 12 Alternate Level Stairs-Rails: Right Bathroom Shower/Tub: Tub/shower unit;Curtain Firefighter:  (has 3 in 1 she uses primarily ) Bathroom Accessibility: Yes How Accessible: Accessible via walker Home Adaptive Equipment: Bedside commode/3-in-1;Walker - rolling Prior Function Level of Independence: Needs assistance Needs Assistance: Dressing;Bathing;Light Housekeeping;Toileting;Meal Prep;Gait;Transfers Bath: Maximal Dressing: Maximal Toileting: Other (comment) (states she can transfer to 3 in 1) Meal Prep: Total Light Housekeeping: Total Gait Assistance: requires assistance for longer distances and RW  Transfer Assistance: states husband or son helps her get in/out of bed. Able to Take Stairs?: Yes (with assistance ) Driving: No Vocation: On disability Communication Communication: No difficulties         Vision/Perception     Cognition  Cognition Arousal/Alertness: Awake/alert Behavior During Therapy: WFL for tasks assessed/performed Overall Cognitive Status: Within Functional Limits for tasks assessed    Extremity/Trunk Assessment Right Upper Extremity Assessment RUE ROM/Strength/Tone: [facility] for tasks assessed Left Upper Extremity Assessment LUE  ROM/Strength/Tone: WFL for tasks assessed Right Lower Extremity Assessment RLE ROM/Strength/Tone: Unable to fully assess;Due to pain (able to perform LAQ; cannot tolerate resistance) RLE Sensation:  (states her R LE feels "numb") Left Lower Extremity Assessment LLE ROM/Strength/Tone: WFL for tasks assessed LLE Sensation: WFL - Light Touch Trunk Assessment Trunk Assessment: Normal     Mobility Bed Mobility Bed Mobility: Rolling Right;Rolling Left Rolling Right: With rail;3: Mod assist Rolling Left: 3: Mod assist;With rail Right Sidelying to Sit: 4: Min assist;With rails;HOB elevated Sit to Sidelying Right: HOB elevated;4: Min assist Details for Bed Mobility Assistance: Assist to completely roll onto L/R sides in order to repositioning linens and pt in bed.  Pt very painful and requiring incr time and effort. VCs for sequencing and hand placement. Transfers Sit to Stand: 3: Mod assist;From bed;From elevated surface;With upper extremity assist Stand to Sit: To chair/3-in-1;With upper extremity assist;4: Min assist;With armrests Details for Transfer Assistance: multimodal cues for sequencing and hand placement to adhere to back precautions; requires assistance to stand due to numbness and pain in R LE     Exercise     Balance Balance Balance Assessed: Yes Static Sitting Balance Static Sitting - Balance Support: Bilateral upper extremity supported;Feet unsupported Static Sitting - Level of Assistance: 5: Stand by assistance Static Sitting - Comment/# of Minutes: tolerated sitting EOB ~10 min   End of Session OT - End of Session Activity Tolerance: Patient limited by pain Patient left: in bed;with call bell/phone within reach;with family/visitor present Nurse Communication: Patient requests pain meds  GO   01/30/2013 Cipriano Mile OTR/L Pager [phone] Office [phone]   Cipriano Mile 01/30/2013, 1:38 PM

## 2013-01-31 ENCOUNTER — Inpatient Hospital Stay (HOSPITAL_COMMUNITY): Payer: Worker's Compensation

## 2013-01-31 IMAGING — CR DG LUMBAR SPINE 2-3V
2 series · 2 of 2 positions shown · non-contrast
Comparison: [DATE], plain films and CT

CLINICAL DATA: Status post fusion.  Standing films without brace.

LUMBAR SPINE - 2-3 VIEW

[w lumbar spine ap]
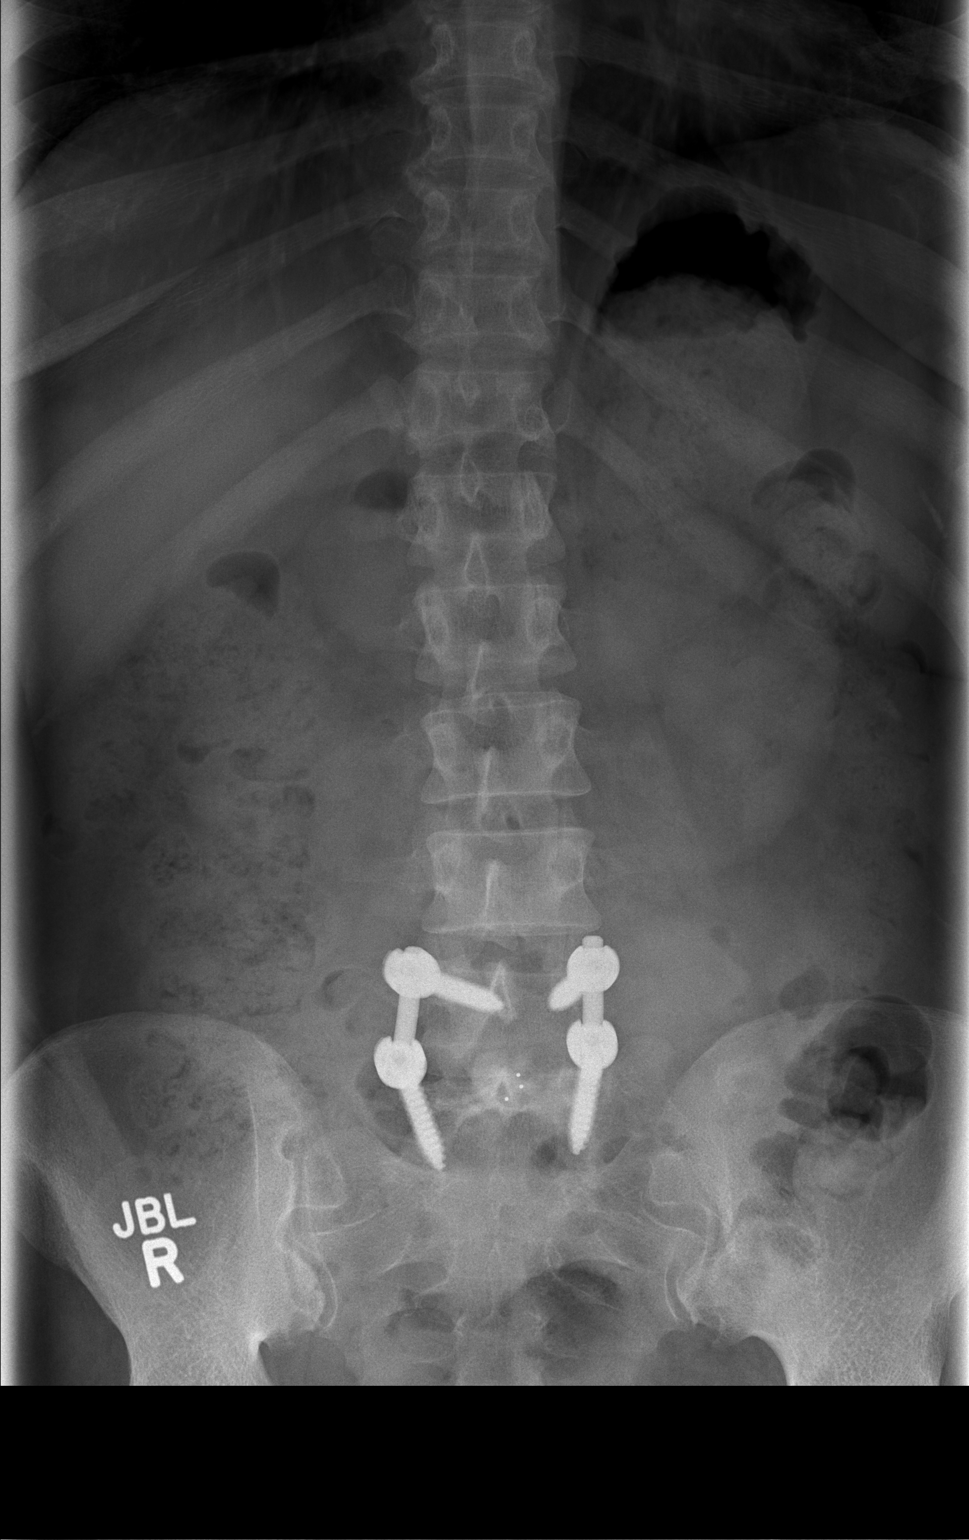

[w lumbar spine lat]
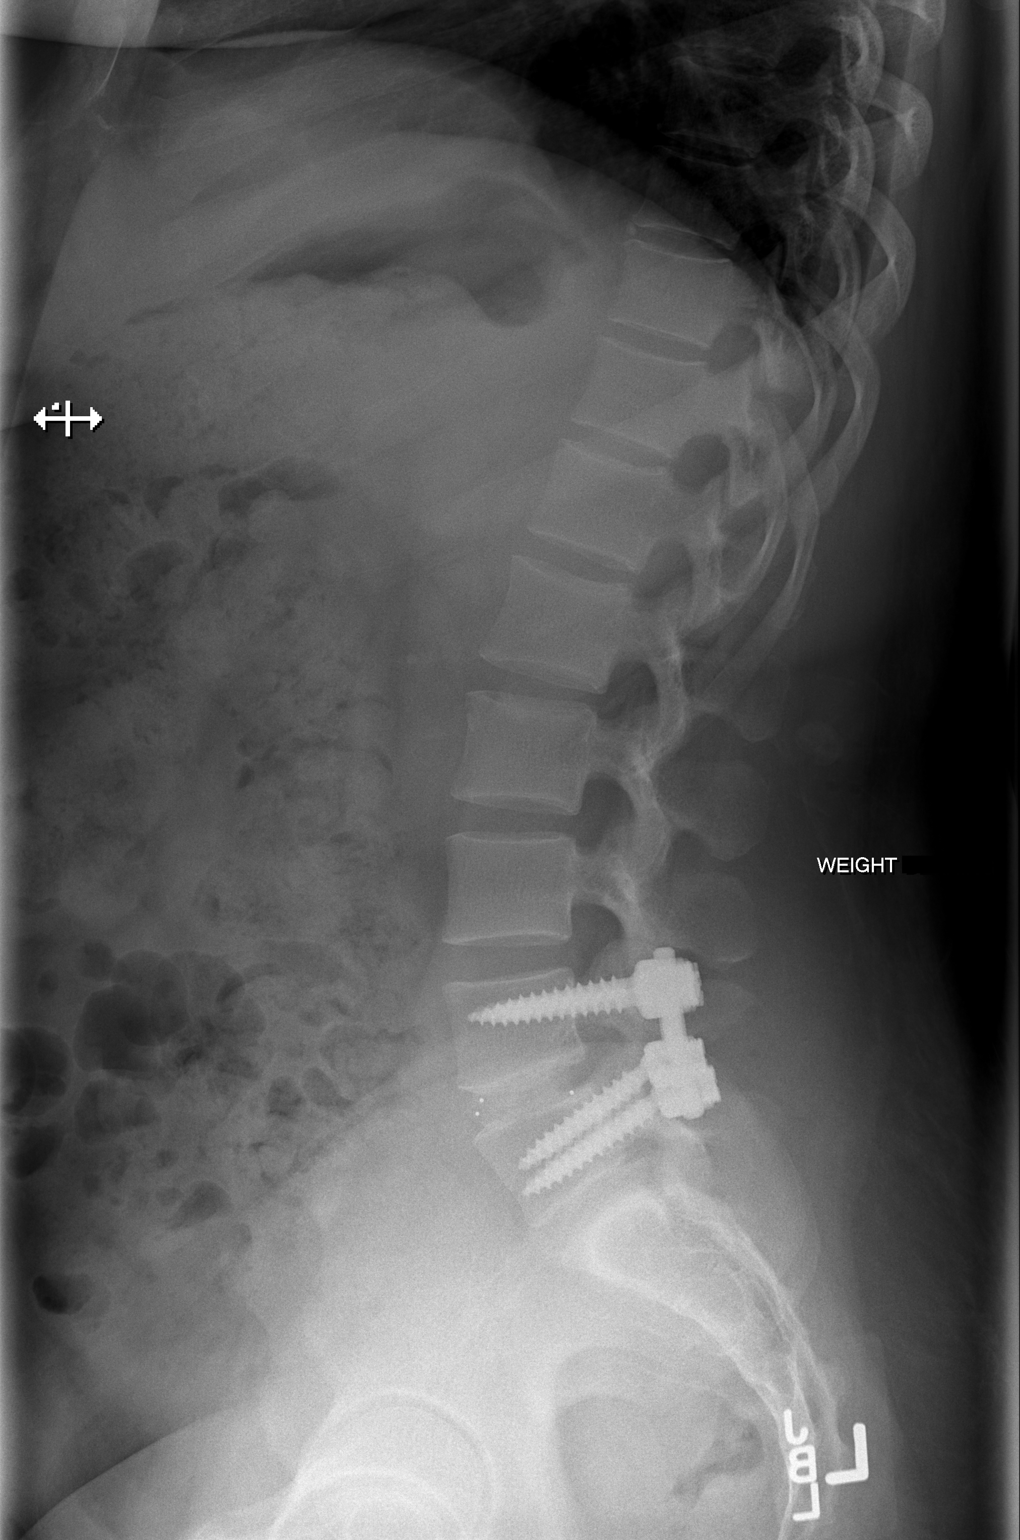

[2 of 2 positions shown; findings below may reference images not displayed]

FINDINGS: Numbering is based on prior CT exam.  Patient has had
posterior fusion at L4-5.  No acute fracture or subluxation.  No
suspicious lytic or blastic lesions identified.  Regional bowel gas
pattern is nonobstructive.
IMPRESSION: Status post posterior fusion.  Normal alignment.

## 2013-01-31 MED ORDER — GABAPENTIN 100 MG PO CAPS
100.0000 mg | ORAL_CAPSULE | Freq: Three times a day (TID) | ORAL | Status: DC
  Administered 2013-01-31 – 2013-02-03 (×10): 100 mg via ORAL
  Filled 2013-01-31 (×11): qty 1

## 2013-01-31 MED ORDER — DIAZEPAM 5 MG PO TABS
ORAL_TABLET | ORAL | Status: AC
  Filled 2013-01-31: qty 1

## 2013-01-31 NOTE — Progress Notes (Signed)
I just spoke with patient. She sounds excellent. She stated that "I'm starting to feel so much better". Her back pain is tolerable and she is looking forward to getting additional therapy. She states that her right leg aching is nothing like before, is improving, is not constant, and not a significant concern to her at this point. I was very glad to hear how happy she sounded. I did order radiographs, which look excellent. I started her on 100 mg Neurontin TID, and will continue this until at least her next f/u appt. Plan for now is to work toward d/c home vs. CIR depending on PT progress.

## 2013-01-31 NOTE — Progress Notes (Addendum)
Physical Therapy Treatment Patient Details Name: [name] MRN: [id number] DOB: 12/25/1980 Today's Date: 01/31/2013 Time: 3664-4034 PT Time Calculation (min): 25 min  PT Assessment / Plan / Recommendation Comments on Treatment Session  Pt is highly motivated to become more indpendent in order to return home with family and take care of her younger children. She is slowly progressing with therapy. Able to increase participation today. Requires assistance to maintain balance and adhere to back precautions. C/o "burning" pain in R LE throughout treatment. Cont to recommend CIR; if pt is unable, she will require 24/7 (A) to return home.    Follow Up Recommendations  CIR     Does the patient have the potential to tolerate intense rehabilitation     Barriers to Discharge        Equipment Recommendations  None recommended by PT (will require aide, hospital bed if returning home )    Recommendations for Other Services    Frequency Min 5X/week   Plan Discharge plan remains appropriate;Frequency remains appropriate    Precautions / Restrictions Precautions Precautions: Back;Fall Precaution Booklet Issued: Yes (comment) Precaution Comments: given handout and educated on back precautions . Pt able to recall 2/3 precautions indpendently Required Braces or Orthoses: Spinal Brace Spinal Brace: Thoracolumbosacral orthotic Restrictions Weight Bearing Restrictions: No   Pertinent Vitals/Pain 9/10; describes pain as "burning" primarily in dorsum of R foot and anterior/lateral lower leg     Mobility  Bed Mobility Bed Mobility: Not assessed (pt sitting in chair in upright position) Transfers Transfers: Sit to Stand;Stand to Sit Sit to Stand: 4: Min assist;From bed;With armrests;With upper extremity assist Stand to Sit: 4: Min assist;To chair/3-in-1;With upper extremity assist;With armrests Details for Transfer Assistance: (A) required to rise from lower surface chair due to pain and in  order to adhere to back precautions; pt unsteady with intial standing due to pain; pt feels more steady with LEs wide; requires cues for hand placement and sequencing to maintain back precautions; requires increased time due to pain  Ambulation/Gait Ambulation/Gait Assistance: 4: Min assist Ambulation Distance (Feet): 20 Feet Assistive device: Rolling walker Ambulation/Gait Assistance Details: verbal cues for gt sequencing and RW safety; pt requires assistance to maintain balance; can become off balance at times with weightshifting onto R LE, no LOB, can steady with assistance; c/o "burning" pain in R LE with amb Gait Pattern: Step-through pattern;Wide base of support (bil LEs externally rotated) Gait velocity: decreased; unable to increase due to 9/10 pain with amb Stairs: No Wheelchair Mobility Wheelchair Mobility: No    Exercises Total Joint Exercises Ankle Circles/Pumps: AROM;Both;10 reps   PT Diagnosis:    PT Problem List:   PT Treatment Interventions:     PT Goals Acute Rehab PT Goals PT Goal Formulation: With patient Time For Goal Achievement: 02/06/13 Potential to Achieve Goals: Good PT Goal: Stand to Sit - Progress: Progressing toward goal PT Goal: Ambulate - Progress: Progressing toward goal Additional Goals PT Goal: Additional Goal #1 - Progress: Progressing toward goal  Visit Information  Last PT Received On: 01/31/13 Assistance Needed: +1    Subjective Data  Subjective: "today is better. I am now having a burning pain in my right leg instead of the numbness." agreeable to therapy Patient Stated Goal: no more pain   Cognition  Cognition Arousal/Alertness: Awake/alert Behavior During Therapy: WFL for tasks assessed/performed Overall Cognitive Status: Within Functional Limits for tasks assessed    Balance  Balance Balance Assessed: Yes Static Standing Balance Static Standing - Balance Support:  Bilateral upper extremity supported;During functional  activity Static Standing - Level of Assistance: 4: Min assist  End of Session PT - End of Session Equipment Utilized During Treatment: Gait belt;Back brace Activity Tolerance: Patient limited by pain Patient left: in chair;with call bell/phone within reach;Other (comment) (PA in room) Nurse Communication: Mobility status   GP     Donell Sievert, [place] [phone] 01/31/2013, 10:29 AM

## 2013-01-31 NOTE — Progress Notes (Signed)
PO day #3 after L L4-5 TLIF for L leg px and PO Day #2 after removal and reinsertion of R L5 pedicle screw, doing well with resolution of L leg pain.  She reports numbness in her right leg yesterday morning and burning in the right leg that began yesterday afternoon and has persisted this morning.  Her back pain is significantly better than it was yesterday. She has just completed PT this morning, and her therapist reports that she shows good effort.  TLSO brace intact and properly applied. SCD's in place. Sensation intact BIL LEs, neurovascularly intact   A/P: PO day #3 after L L4-5 TLIF for L leg px & PO Day #2 after removal and reinsertion of R L5 pedicle screw   - TLSO brace at all times when OOB  - Back precautions  - Cont SCD's   - D/C home Sun pending PT/OT progress.  PT is concerned about the stairclimbing required to get her bed at home.  We have consulted inpatient rehab, but may also consider a hospital bed on the ground floor of her home if insurance will cover that.   - Written prescriptions for Percocet and Valium in the pts chart  - F/U appt card in chart with D/C instructions

## 2013-02-01 MED ORDER — GABAPENTIN 100 MG PO CAPS: 100.0000 mg | ORAL_CAPSULE | Freq: Three times a day (TID) | ORAL | Status: DC

## 2013-02-01 NOTE — Progress Notes (Signed)
PO day #4 after L L4-5 TLIF for L leg px and PO Day #3 after removal and reinsertion of R L5 pedicle screw, doing well with resolution of L leg pain. She reports improvement in the burning in the right leg.  Her back pain is better than yesterday as well.   Low back dressing is clean and dry.  Strength intact bilateral LEs, neurovascularly intact   A/P: PO day #4 after L L4-5 TLIF for L leg px & PO Day #3 after removal and reinsertion of R L5 pedicle screw  - TLSO brace at all times when OOB  - Back precautions  - Cont SCD's  - D/C home today or tomorrow pending PT/OT progress. PT is concerned about the stairclimbing required to get her bed at home. We have consulted inpatient rehab, but hopefully she will do well with PT and d/c home - Written prescriptions for Percocet, Neurontin and Valium in the pts chart  - F/U appt card in chart with D/C instructions

## 2013-02-01 NOTE — Progress Notes (Signed)
Occupational Therapy Treatment Patient Details Name: [name] MRN: [id number] DOB: 1980/10/16 Today's Date: 02/01/2013 Time: 7829-5621 OT Time Calculation (min): 23 min  OT Assessment / Plan / Recommendation Comments on Treatment Session Patient with improved mobility this date. She reports pain is still limiting her function. Patient very worried about managing at home due to not having assistance (husband works).    Follow Up Recommendations  CIR    Barriers to Discharge       Equipment Recommendations  3 in 1 bedside comode    Recommendations for Other Services    Frequency Min 2X/week   Plan Discharge plan remains appropriate    Precautions / Restrictions Precautions Precautions: Back;Fall Precaution Booklet Issued: Yes (comment) Precaution Comments: given handout and educated on back precautions  Required Braces or Orthoses: Spinal Brace Spinal Brace: Thoracolumbosacral orthotic Restrictions Weight Bearing Restrictions: No   Pertinent Vitals/Pain     ADL  Grooming: Performed;Min guard Where Assessed - Grooming: Supported standing Upper Body Dressing: Performed;Maximal assistance (brace) Where Assessed - Upper Body Dressing: Supported sit to Pharmacist, hospital: Performed;Min Pension scheme manager Method: Sit to Barista: Raised toilet seat with arms (or 3-in-1 over toilet) Toileting - Clothing Manipulation and Hygiene: Performed;Min guard Where Assessed - Glass blower/designer Manipulation and Hygiene: Sit to stand from 3-in-1 or toilet Transfers/Ambulation Related to ADLs: Patient performed bed mobility with min A and cues, sit to stand from bed and toilet with min guard A, and ambulated to bathroom and then to recliner with min guard A and RW. ADL Comments: Reviewed back precautions with pt.    OT Diagnosis:    OT Problem List:   OT Treatment Interventions:     OT Goals ADL Goals ADL Goal: Grooming - Progress: Progressing toward  goals ADL Goal: Upper Body Dressing - Progress: Progressing toward goals ADL Goal: Toilet Transfer - Progress: Progressing toward goals ADL Goal: Toileting - Clothing Manipulation - Progress: Progressing toward goals ADL Goal: Toileting - Hygiene - Progress: Progressing toward goals Miscellaneous OT Goals OT Goal: Miscellaneous Goal #1 - Progress: Progressing toward goals  Visit Information  Last OT Received On: 02/01/13 Assistance Needed: +1    Subjective Data      Prior Functioning       Cognition  Cognition Arousal/Alertness: Awake/alert Behavior During Therapy: WFL for tasks assessed/performed Overall Cognitive Status: Within Functional Limits for tasks assessed    Mobility  Bed Mobility Rolling Left: 4: Min assist;With rail Right Sidelying to Sit: 4: Min assist;With rails;HOB elevated    Exercises      Balance     End of Session OT - End of Session Equipment Utilized During Treatment: Gait belt;Back brace Activity Tolerance: Patient tolerated treatment well;Patient limited by pain Patient left: in chair;with call bell/phone within reach  GO     [name] A 02/01/2013, 11:18 AM

## 2013-02-01 NOTE — Progress Notes (Signed)
Physical Therapy Treatment Patient Details Name: [name] MRN: [id number] DOB: 1981-08-21 Today's Date: 02/01/2013 Time: 4098-1191 PT Time Calculation (min): 20 min  PT Assessment / Plan / Recommendation Comments on Treatment Session  Pt is slowly progressing with PT. Is very concerned about D/C disposition due to husband working 8hrs/daily and having 4 children with steps to amb up. Will cont to recommend CIR; if pt does not qualify pt will need 24hr supervision and assistance.     Follow Up Recommendations  CIR     Does the patient have the potential to tolerate intense rehabilitation     Barriers to Discharge        Equipment Recommendations  None recommended by PT    Recommendations for Other Services    Frequency Min 5X/week   Plan Discharge plan remains appropriate;Frequency remains appropriate    Precautions / Restrictions Precautions Precautions: Back;Fall Precaution Booklet Issued: Yes (comment) Precaution Comments: pt indpendently recalls 3/3 back precautions  Required Braces or Orthoses: Spinal Brace Spinal Brace: Thoracolumbosacral orthotic (pt able to instruct on proper donning technique of brace) Restrictions Weight Bearing Restrictions: No   Pertinent Vitals/Pain 8/10;primarily in R LE. No c/o dizziness during this session. Stated she had another dizzy spell earlier.  Pt repositioned in chair.    Mobility  Bed Mobility Bed Mobility: Rolling Right;Rolling Left;Left Sidelying to Sit Rolling Right: 4: Min assist;With rail (c/o pain ) Rolling Left: 4: Min assist;With rail Left Sidelying to Sit: 4: Min assist;HOB flat;With rails Details for Bed Mobility Assistance: (A) and cues for proper log roll technique; requires increased time to complete tasks due to pain; cont to c/o "sharp shooting" pain in R LE.  Transfers Transfers: Sit to Stand;Stand to Sit Sit to Stand: 4: Min assist;With upper extremity assist;From bed;From elevated surface Stand to Sit: 3:  Mod assist;To chair/3-in-1;With armrests Details for Transfer Assistance: requires increased (A) to control descent to lower surfaces (ie chair); requires cues to maintain back precautions and safety with transfers; attempts to pull to stand with RW Ambulation/Gait Ambulation/Gait Assistance: 4: Min assist Ambulation Distance (Feet): 60 Feet Assistive device: Rolling walker Ambulation/Gait Assistance Details: verbal cues for gt sequencing; pt demo difficulty advancing R LE at times due to pain; amb with decreased step length on L LE due to inability to WB equally on R LE Gait Pattern: Step-through pattern;Wide base of support (R LE Externally rotated) Gait velocity: decreased; unable to safely increase at this time due to pain and balance deficits  Stairs: Yes Stairs Assistance: 4: Min assist Stairs Assistance Details (indicate cue type and reason): (A) for safety due to balance deficits and increased pain; required cues for propr gt sequencing  Stair Management Technique: Forwards;Step to pattern;With walker Number of Stairs: 1 Wheelchair Mobility Wheelchair Mobility: No    Exercises     PT Diagnosis:    PT Problem List:   PT Treatment Interventions:     PT Goals Acute Rehab PT Goals PT Goal Formulation: With patient Time For Goal Achievement: 02/06/13 Potential to Achieve Goals: Good PT Goal: Rolling Supine to Right Side - Progress: Progressing toward goal PT Goal: Sit to Supine/Side - Progress: Progressing toward goal PT Goal: Stand to Sit - Progress: Progressing toward goal PT Goal: Ambulate - Progress: Progressing toward goal PT Goal: Up/Down Stairs - Progress: Progressing toward goal Additional Goals PT Goal: Additional Goal #1 - Progress: Met  Visit Information  Last PT Received On: 02/01/13 Assistance Needed: +1    Subjective Data  Subjective: "  I am just not sure how i am supposed to take care of my family moving like this." agreeable to therapy and to attempt step  today  Patient Stated Goal: take care of family   Cognition  Cognition Arousal/Alertness: Awake/alert Behavior During Therapy: WFL for tasks assessed/performed Overall Cognitive Status: Within Functional Limits for tasks assessed    Balance  Balance Balance Assessed: No  End of Session PT - End of Session Equipment Utilized During Treatment: Gait belt;Back brace Activity Tolerance: Patient tolerated treatment well Patient left: in chair;with call bell/phone within reach;with family/visitor present Nurse Communication: Mobility status   GP     Donell Sievert, [place] [phone] 02/01/2013, 3:31 PM

## 2013-02-02 DIAGNOSIS — IMO0002 Reserved for concepts with insufficient information to code with codable children: Secondary | ICD-10-CM

## 2013-02-02 DIAGNOSIS — M47817 Spondylosis without myelopathy or radiculopathy, lumbosacral region: Secondary | ICD-10-CM

## 2013-02-02 MED ORDER — ONDANSETRON HCL 4 MG PO TABS: 4.0000 mg | ORAL_TABLET | Freq: Four times a day (QID) | ORAL | Status: DC | PRN

## 2013-02-02 MED ORDER — ONDANSETRON HCL 4 MG/2ML IJ SOLN
4.0000 mg | Freq: Four times a day (QID) | INTRAMUSCULAR | Status: DC
Start: 2013-02-02 — End: 2013-02-02

## 2013-02-02 MED ORDER — ONDANSETRON HCL 4 MG/2ML IJ SOLN
4.0000 mg | Freq: Four times a day (QID) | INTRAMUSCULAR | Status: DC | PRN
  Administered 2013-02-02: 4 mg via INTRAVENOUS
  Filled 2013-02-02: qty 2

## 2013-02-02 NOTE — Progress Notes (Signed)
Physical Therapy Treatment Patient Details Name: [name] MRN: [id number] DOB: 14-Dec-1980 Today's Date: 02/02/2013 Time: 4098-1191 PT Time Calculation (min): 19 min  PT Assessment / Plan / Recommendation Comments on Treatment Session  Pt is highly motivated to participate and increase independence in order to return home and take care of family. Pt is highly concerned with returning home and not having assistance when husband is at work. Pt is agreeable to CIR. Will cont to f/u with pt to increase mobility.     Follow Up Recommendations  CIR     Does the patient have the potential to tolerate intense rehabilitation     Barriers to Discharge        Equipment Recommendations  None recommended by PT    Recommendations for Other Services    Frequency Min 5X/week   Plan Discharge plan remains appropriate;Frequency remains appropriate    Precautions / Restrictions Precautions Precautions: Back;Fall Precaution Booklet Issued: Yes (comment) Precaution Comments: pt indpendently recalls 3/3 back precautions  Required Braces or Orthoses: Spinal Brace Spinal Brace: Thoracolumbosacral orthotic (pt able to instruct on proper donning technique of brace) Restrictions Weight Bearing Restrictions: No   Pertinent Vitals/Pain 8/10 with amb; c/o numbness and tingling in R LE; primarily in R lateral LE    Mobility  Bed Mobility Bed Mobility: Not assessed (pt sitting EOB) Transfers Transfers: Sit to Stand;Stand to Sit Sit to Stand: 4: Min assist;From bed Stand to Sit: 4: Min assist;To bed Details for Transfer Assistance: encouraged to control descent to bed with bil LEs while maintainng back precautions; requires (A) to balance  Ambulation/Gait Ambulation/Gait Assistance: 4: Min assist Ambulation Distance (Feet): 70 Feet Assistive device: Rolling walker Ambulation/Gait Assistance Details: verbal cues for safety with RW and to maintain back precaution and not twist with turns  Gait  Pattern: Step-through pattern;Wide base of support Gait velocity: decreased; unable to safely increase at this time due to pain and balance deficits  Stairs: No Wheelchair Mobility Wheelchair Mobility: No    Exercises     PT Diagnosis:    PT Problem List:   PT Treatment Interventions:     PT Goals Acute Rehab PT Goals PT Goal Formulation: With patient Time For Goal Achievement: 02/06/13 Potential to Achieve Goals: Good PT Goal: Stand to Sit - Progress: Progressing toward goal PT Goal: Ambulate - Progress: Progressing toward goal Additional Goals PT Goal: Additional Goal #1 - Progress: Met  Visit Information  Last PT Received On: 02/02/13 Assistance Needed: +1    Subjective Data  Subjective: "I really want to walk before lunch" Pt sitting EOB donning pants herself  Patient Stated Goal: take care of family   Cognition  Cognition Arousal/Alertness: Awake/alert Behavior During Therapy: WFL for tasks assessed/performed Overall Cognitive Status: Within Functional Limits for tasks assessed    Balance  Balance Balance Assessed: No  End of Session PT - End of Session Equipment Utilized During Treatment: Gait belt;Back brace Activity Tolerance: Patient tolerated treatment well Patient left: in bed;with call bell/phone within reach;Other (comment) (sitting EOB) Nurse Communication: Mobility status   GP     Donell Sievert, [place] [phone] 02/02/2013, 12:43 PM

## 2013-02-02 NOTE — Progress Notes (Signed)
s/p L L4-5 TLIF for L leg px and removal and reinsertion of R L5 pedicle screw.  Her back and leg pain is improving.  She complains of nausea and vomiting this morning, so she has not been able to work with PT yet.  She was evaluated by the inpatient rehab team yesterday, and she seems to be a good candidate for CIR pending approval from insurance.  TLSO brace intact and properly applied. Sensation intact BIL LEs, neurovascularly intact   A/P: s/p L L4-5 TLIF for L leg px & removal and reinsertion of R L5 pedicle screw  - TLSO brace at all times when OOB  - Back precautions  - Cont SCD's  - CIR when approved by insurance.  May need hospital bed prior to return home.   - will add zofran for nausea - Written prescriptions for Percocet, neurontin,and Valium in the pts chart  - F/U appt card in chart with D/C instructions

## 2013-02-02 NOTE — Consult Note (Signed)
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 Yevette Edwards   HPI: [name] is a 32 y.o. right-handed female with history of left-sided L4-5 microdiscectomy 06/05/2012. Admitted 01/28/2013 with recurrent back pain radiating to the lower extremities. 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 Underwent revision L4-5 decompression with removal of left sided disc fragment that was causing compression of the transversing L5 nerve/transforaminal lumbar interbody fusion 01/29/2013. Postoperative day 1 with increasing right lower extremity pain. CT scan reveals slightly medially deviated right L5 screw. Underwent removal of right L5 pedicle screw with reinsertion of screw to a more lateral position 01/30/2013 per Dr. Yevette Edwards. Postoperative pain management. Patient was fitted with a TLSO orthotic brace. Physical and occupational therapy evaluation completed 01/30/2013 with recommendations of physical medicine rehabilitation consult to consider inpatient rehabilitation services.   Review of Systems  Gastrointestinal:       GERD  Musculoskeletal: Positive for back pain.  Neurological: Positive for headaches.  Psychiatric/Behavioral: Positive for depression.       Anxiety  All other systems reviewed and are negative.   Past Medical History  Diagnosis Date  . Neck pain   . Migraines   . Depression   . Anxiety     relative to circumstances to back problems/injury, not taking any medicine, pt. reports has been crying a lot  . Asthma   . GERD (gastroesophageal reflux disease)   . Arthritis     low back   Past Surgical History  Procedure Laterality Date  . Lumbar laminectomy/decompression microdiscectomy  06/05/2012    Procedure: LUMBAR LAMINECTOMY/DECOMPRESSION MICRODISCECTOMY;  Surgeon: Emilee Hero, MD;  Location: [facility] OR;  Service: Orthopedics;   Laterality: Left;  Left sided lumbar 4-5 microdisectomy  . Childbirth      x4 vaginal    History reviewed. No pertinent family history. Social History:  reports that she has never smoked. She has never used smokeless tobacco. She reports that she does not drink alcohol or use illicit drugs. Allergies:  Allergies  Allergen Reactions  . Hydrocodone-Acetaminophen     Has some itching on face & nose but not bad enough to stop taking it/ MD aware and instructed patient to take anti-allergy medication prn.   Medications Prior to Admission  Medication Sig Dispense Refill  . albuterol (PROVENTIL HFA;VENTOLIN HFA) 108 (90 BASE) MCG/ACT inhaler Inhale 2 puffs into the lungs every 4 (four) hours as needed for wheezing.      . calcium carbonate (TUMS EX) 750 MG chewable tablet Chew 1 tablet by mouth 2 (two) times daily as needed for heartburn.      . chlorpheniramine (CHLOR-TRIMETON) 4 MG tablet Take 4 mg by mouth every 4 (four) hours as needed for allergies or rhinitis (seasonal allergies).       . [DISCONTINUED] HYDROcodone-acetaminophen (NORCO) 10-325 MG per tablet Take 1-2 tablets by mouth every 4 (four) hours as needed for pain.      . [DISCONTINUED] meloxicam (MOBIC) 15 MG tablet Take 15 mg by mouth daily.        Home: Home Living Lives With: Family;Other (Comment) (has childern ages 63,5,10,14) Available Help at Discharge: Family;Available PRN/intermittently;Other (Comment) (husband works at least 8 hours/day) Type of Home: Apartment Home Access: Level entry Home Layout: Two level;Bed/bath upstairs Alternate Level Stairs-Number of Steps: 12 Alternate Level Stairs-Rails: Right Bathroom Shower/Tub: Tub/shower unit;Curtain Firefighter:  (has 3 in 1 she uses primarily ) Bathroom Accessibility: Yes How Accessible:  Accessible via walker Home Adaptive Equipment: Bedside commode/3-in-1;Walker - rolling  Functional History: Prior Function Bath: Maximal Toileting: Other (comment) (states  she can transfer to 3 in 1) Dressing: Maximal Meal Prep: Total Light Housekeeping: Total Able to Take Stairs?: Yes (with assistance ) Driving: No Vocation: On disability Functional Status:  Mobility: Bed Mobility Bed Mobility: Rolling Right;Rolling Left;Left Sidelying to Sit Rolling Right: 4: Min assist;With rail (c/o pain ) Rolling Left: 4: Min assist;With rail Right Sidelying to Sit: 4: Min assist;With rails;HOB elevated Left Sidelying to Sit: 4: Min assist;HOB flat;With rails Sit to Sidelying Right: HOB elevated;4: Min assist Transfers Transfers: Sit to Stand;Stand to Sit Sit to Stand: 4: Min assist;With upper extremity assist;From bed;From elevated surface Stand to Sit: 3: Mod assist;To chair/3-in-1;With armrests Stand Pivot Transfers: 3: Mod assist Ambulation/Gait Ambulation/Gait Assistance: 4: Min assist Ambulation Distance (Feet): 60 Feet Assistive device: Rolling walker Ambulation/Gait Assistance Details: verbal cues for gt sequencing; pt demo difficulty advancing R LE at times due to pain; amb with decreased step length on L LE due to inability to WB equally on R LE Gait Pattern: Step-through pattern;Wide base of support (R LE Externally rotated) Gait velocity: decreased; unable to safely increase at this time due to pain and balance deficits  Stairs: Yes Stairs Assistance: 4: Min assist Stairs Assistance Details (indicate cue type and reason): (A) for safety due to balance deficits and increased pain; required cues for propr gt sequencing  Stair Management Technique: Forwards;Step to pattern;With walker Number of Stairs: 1 Wheelchair Mobility Wheelchair Mobility: No  ADL: ADL Grooming: Performed;Min guard Where Assessed - Grooming: Supported standing Upper Body Bathing: Simulated;Minimal assistance Where Assessed - Upper Body Bathing: Supine, head of bed up Lower Body Bathing: Simulated;+1 Total assistance Where Assessed - Lower Body Bathing: Rolling right and/or  left;Supine, head of bed flat Upper Body Dressing: Performed;Maximal assistance (brace) Where Assessed - Upper Body Dressing: Supported sit to stand Lower Body Dressing: Simulated;+1 Total assistance Where Assessed - Lower Body Dressing: Supine, head of bed flat;Rolling right and/or left Toilet Transfer: Performed;Min guard Toilet Transfer Method: Sit to Barista: Raised toilet seat with arms (or 3-in-1 over toilet) Transfers/Ambulation Related to ADLs: Patient performed bed mobility with min A and cues, sit to stand from bed and toilet with min guard A, and ambulated to bathroom and then to recliner with min guard A and RW. ADL Comments: Reviewed back precautions with pt.  Cognition: Cognition Overall Cognitive Status: Within Functional Limits for tasks assessed Arousal/Alertness: Awake/alert Orientation Level: Oriented X4 Cognition Arousal/Alertness: Awake/alert Behavior During Therapy: WFL for tasks assessed/performed Overall Cognitive Status: Within Functional Limits for tasks assessed  Blood pressure 93/58, pulse 97, temperature 98.6 F (37 C), temperature source Oral, resp. rate 16, height 5' 4.8" (1.646 m), weight 90.8 kg (200 lb 2.8 oz), last menstrual period 12/01/2012, SpO2 98.00%. Physical Exam  Vitals reviewed. Constitutional: She is oriented to person, place, and time.  Obese   HENT:  Head: Normocephalic and atraumatic.  Right Ear: External ear normal.  Left Ear: External ear normal.  Eyes: EOM are normal.  Neck: Normal range of motion. Neck supple. No JVD present. No tracheal deviation present. Thyromegaly present.  Cardiovascular: Normal rate and regular rhythm.   Pulmonary/Chest: Effort normal and breath sounds normal. No respiratory distress.  Abdominal: Soft. Bowel sounds are normal. She exhibits no distension.  Lymphadenopathy:    She has no cervical adenopathy.  Neurological: [name] and oriented to person, place, and time. She  displays normal reflexes.  UE near 5/5. RLE is 3-4/5 prox to 4/5 distal with ADF and APF. Mild sensory loss over anterior foot and legn. LLE is 4/5 prox to 5/5 distally.   Skin:  Back brace on chair. Wound dressed.  Psychiatric: She has a normal mood and affect. Her behavior is normal. Judgment and thought content normal.    No results found for this or any previous visit (from the past 24 hour(s)). Dg Lumbar Spine 2-3 Views  01/31/2013   *RADIOLOGY REPORT*  Clinical Data: Status post fusion.  Standing films without brace.  LUMBAR SPINE - 2-3 VIEW  Comparison: 01/29/2013, plain films and CT  Findings: Numbering is based on prior CT exam.  Patient has had posterior fusion at L4-5.  No acute fracture or subluxation.  No suspicious lytic or blastic lesions identified.  Regional bowel gas pattern is nonobstructive.  IMPRESSION: Status post posterior fusion.  Normal alignment.   Original Report Authenticated By: Norva Pavlov, M.D.    Assessment/Plan: Diagnosis: lumbar HNP with L5 radiculopathy 1. Does the need for close, 24 hr/day medical supervision in concert with the patient's rehab needs make it unreasonable for this patient to be served in a less intensive setting? Yes 2. Co-Morbidities requiring supervision/potential complications: pain, obesity 3. Due to bladder management, bowel management, safety, skin/wound care, disease management, medication administration, pain management and patient education, does the patient require 24 hr/day rehab nursing? Yes 4. Does the patient require coordinated care of a physician, rehab nurse, PT (1-2 hrs/day, 5 days/week) and OT (1-2 hrs/day, 5 days/week) to address physical and functional deficits in the context of the above medical diagnosis(es)? Yes Addressing deficits in the following areas: balance, endurance, locomotion, strength, transferring, bowel/bladder control, bathing, dressing, feeding, grooming, toileting and psychosocial support 5. Can the  patient actively participate in an intensive therapy program of at least 3 hrs of therapy per day at least 5 days per week? Yes 6. The potential for patient to make measurable gains while on inpatient rehab is excellent 7. Anticipated functional outcomes upon discharge from inpatient rehab are mod I with PT, set up to mod I with OT, n/a with SLP. 8. Estimated rehab length of stay to reach the above functional goals is: 5-7 days 9. Does the patient have adequate social supports to accommodate these discharge functional goals? Yes 10. Anticipated D/C setting: Home 11. Anticipated post D/C treatments: HH therapy 12. Overall Rehab/Functional Prognosis: excellent  RECOMMENDATIONS: This patient's condition is appropriate for continued rehabilitative care in the following setting: CIR Patient has agreed to participate in recommended program. Yes Note that insurance prior authorization may be required for reimbursement for recommended care.  Comment:Rehab RN to follow up.   Ranelle Oyster, MD, [place] [facility]     02/02/2013

## 2013-02-03 ENCOUNTER — Encounter (HOSPITAL_COMMUNITY): Payer: Self-pay | Admitting: *Deleted

## 2013-02-03 ENCOUNTER — Inpatient Hospital Stay (HOSPITAL_COMMUNITY)
Admission: RE | Admit: 2013-02-03 | Discharge: 2013-02-10 | DRG: 946 | Disposition: A | Discharge status: Home / Self Care | Payer: Worker's Compensation | Source: Intra-hospital | Attending: Physical Medicine & Rehabilitation | Admitting: Physical Medicine & Rehabilitation

## 2013-02-03 DIAGNOSIS — K219 Gastro-esophageal reflux disease without esophagitis: Secondary | ICD-10-CM | POA: Diagnosis present

## 2013-02-03 DIAGNOSIS — IMO0002 Reserved for concepts with insufficient information to code with codable children: Secondary | ICD-10-CM

## 2013-02-03 DIAGNOSIS — M5126 Other intervertebral disc displacement, lumbar region: Secondary | ICD-10-CM

## 2013-02-03 DIAGNOSIS — K5900 Constipation, unspecified: Secondary | ICD-10-CM | POA: Diagnosis present

## 2013-02-03 DIAGNOSIS — Z5189 Encounter for other specified aftercare: Principal | ICD-10-CM

## 2013-02-03 DIAGNOSIS — F329 Major depressive disorder, single episode, unspecified: Secondary | ICD-10-CM

## 2013-02-03 DIAGNOSIS — J45909 Unspecified asthma, uncomplicated: Secondary | ICD-10-CM | POA: Diagnosis present

## 2013-02-03 DIAGNOSIS — F3289 Other specified depressive episodes: Secondary | ICD-10-CM | POA: Diagnosis present

## 2013-02-03 LAB — COMPREHENSIVE METABOLIC PANEL
AST: 28 U/L (ref 0–37)
BUN: 11 mg/dL (ref 6–23)
CO2: 24 mEq/L (ref 19–32)
Chloride: 100 mEq/L (ref 96–112)
Creatinine, Ser: 0.68 mg/dL (ref 0.50–1.10)
GFR calc non Af Amer: >90 mL/min (ref >90)
Glucose, Bld: 118 mg/dL — ABNORMAL HIGH (ref 70–99)
Total Bilirubin: 0.2 mg/dL — ABNORMAL LOW (ref 0.3–1.2)

## 2013-02-03 LAB — CBC WITH DIFFERENTIAL/PLATELET
HCT: 33 % — ABNORMAL LOW (ref 36.0–46.0)
Hemoglobin: 11.4 g/dL — ABNORMAL LOW (ref 12.0–15.0)
Lymphocytes Relative: 39 % (ref 12–46)
Lymphs Abs: 2.5 10*3/uL (ref 0.7–4.0)
Monocytes Absolute: 0.5 10*3/uL (ref 0.1–1.0)
Monocytes Relative: 8 % (ref 3–12)
Neutro Abs: 3 10*3/uL (ref 1.7–7.7)
WBC: 6.5 10*3/uL (ref 4.0–10.5)

## 2013-02-03 MED ORDER — GABAPENTIN 100 MG PO CAPS
100.0000 mg | ORAL_CAPSULE | Freq: Three times a day (TID) | ORAL | Status: DC
  Administered 2013-02-03: 100 mg via ORAL
  Filled 2013-02-03 (×5): qty 1

## 2013-02-03 MED ORDER — METHOCARBAMOL 500 MG PO TABS
500.0000 mg | ORAL_TABLET | Freq: Four times a day (QID) | ORAL | Status: DC | PRN
  Administered 2013-02-03 – 2013-02-10 (×8): 500 mg via ORAL
  Filled 2013-02-03 (×9): qty 1

## 2013-02-03 MED ORDER — SENNOSIDES-DOCUSATE SODIUM 8.6-50 MG PO TABS
1.0000 | ORAL_TABLET | Freq: Every evening | ORAL | Status: DC | PRN
  Administered 2013-02-04: 1 via ORAL
  Filled 2013-02-03: qty 1

## 2013-02-03 MED ORDER — OXYCODONE-ACETAMINOPHEN 5-325 MG PO TABS
1.0000 | ORAL_TABLET | ORAL | Status: DC | PRN
  Administered 2013-02-03 – 2013-02-07 (×15): 2 via ORAL
  Administered 2013-02-08: 1 via ORAL
  Administered 2013-02-08 – 2013-02-10 (×7): 2 via ORAL
  Filled 2013-02-03 (×19): qty 2
  Filled 2013-02-03: qty 1
  Filled 2013-02-03 (×3): qty 2

## 2013-02-03 MED ORDER — ONDANSETRON HCL 4 MG PO TABS
4.0000 mg | ORAL_TABLET | Freq: Four times a day (QID) | ORAL | Status: DC | PRN
  Administered 2013-02-05: 4 mg via ORAL
  Filled 2013-02-03 (×2): qty 1

## 2013-02-03 MED ORDER — ONDANSETRON HCL 4 MG/2ML IJ SOLN
4.0000 mg | Freq: Four times a day (QID) | INTRAMUSCULAR | Status: DC | PRN
  Administered 2013-02-04: 4 mg via INTRAVENOUS
  Filled 2013-02-03: qty 2

## 2013-02-03 MED ORDER — ACETAMINOPHEN 325 MG PO TABS: 325.0000 mg | ORAL_TABLET | ORAL | Status: DC | PRN

## 2013-02-03 MED ORDER — SORBITOL 70 % SOLN
30.0000 mL | Freq: Every day | Status: DC | PRN
  Administered 2013-02-06 – 2013-02-08 (×3): 30 mL via ORAL
  Filled 2013-02-03 (×3): qty 30

## 2013-02-03 MED ORDER — BISACODYL 5 MG PO TBEC
5.0000 mg | DELAYED_RELEASE_TABLET | Freq: Every day | ORAL | Status: DC | PRN
  Administered 2013-02-05: 5 mg via ORAL
  Filled 2013-02-03: qty 1

## 2013-02-03 NOTE — Progress Notes (Signed)
Occupational Therapy Treatment Patient Details Name: [name] MRN: [id number] DOB: 19-Feb-1981 Today's Date: 02/03/2013 Time: 1004-1030 OT Time Calculation (min): 26 min  OT Assessment / Plan / Recommendation Comments on Treatment Session Pt practiced with AE this session for LB dressing. Pt reports having difficulty with toilet hygiene while maintaining precautions. Pt planning to d/c to CIR.     Follow Up Recommendations  CIR    Barriers to Discharge       Equipment Recommendations  3 in 1 bedside comode    Recommendations for Other Services Rehab consult  Frequency Min 2X/week   Plan Discharge plan remains appropriate    Precautions / Restrictions Precautions Precautions: Back;Fall Precaution Comments: pt indpendently recalls 3/3 back precautions  Required Braces or Orthoses: Spinal Brace Spinal Brace: Thoracolumbosacral orthotic Restrictions Weight Bearing Restrictions: No   Pertinent Vitals/Pain Pain 8/10 in back. Repositioned and nurse notified.     ADL  Lower Body Dressing: Performed;Minimal assistance Where Assessed - Lower Body Dressing: Supported sit to stand Toilet Transfer: Simulated;Min Pension scheme manager Method: Sit to Barista: Other (comment) (from bed) Equipment Used: Back brace;Rolling walker;Reacher;Sock aid ADL Comments: Pt donned denim shorts with reacher and doffed them as well with minimal assistance to get completely off feet and cues to maintain precautions during task. Pt verbalized that toilet hygiene is difficult for her to perform while maintaining precautions. Cues for pt to maintain precautions during session.     OT Diagnosis:    OT Problem List:   OT Treatment Interventions:     OT Goals Acute Rehab OT Goals OT Goal Formulation: With patient Time For Goal Achievement: 02/13/13 Potential to Achieve Goals: Good ADL Goals Pt Will Perform Grooming: with supervision;Standing at sink Pt Will Perform Upper  Body Bathing: Sitting, chair;Sitting, edge of bed;with set-up Pt Will Perform Lower Body Bathing: with supervision;Sit to stand from chair;Sit to stand from bed;with adaptive equipment Pt Will Perform Upper Body Dressing: with supervision;Sitting, chair;Sitting, bed Pt Will Perform Lower Body Dressing: with supervision;Sit to stand from chair;Sit to stand from bed;with adaptive equipment ADL Goal: Lower Body Dressing - Progress: Progressing toward goals Pt Will Transfer to Toilet: with supervision;Ambulation;with DME;Comfort height toilet;Maintaining back safety precautions ADL Goal: Toilet Transfer - Progress: Progressing toward goals Pt Will Perform Toileting - Clothing Manipulation: with supervision;Standing;Sitting on 3-in-1 or toilet Pt Will Perform Toileting - Hygiene: with supervision;Sit to stand from 3-in-1/toilet Miscellaneous OT Goals Miscellaneous OT Goal #1: Pt will perform bed mobility at supervision level as precursor for EOB ADLs. OT Goal: Miscellaneous Goal #1 - Progress: Progressing toward goals  Visit Information  Last OT Received On: 02/03/13 Assistance Needed: +1 PT/OT Co-Evaluation/Treatment: Yes    Subjective Data      Prior Functioning       Cognition  Cognition Arousal/Alertness: Awake/alert Behavior During Therapy: WFL for tasks assessed/performed Overall Cognitive Status: Within Functional Limits for tasks assessed    Mobility  Bed Mobility Bed Mobility: Rolling Left;Left Sidelying to Sit;Sit to Sidelying Left Rolling Left: 4: Min guard;With rail Left Sidelying to Sit: 4: Min guard Sit to Sidelying Left: 4: Min guard Details for Bed Mobility Assistance: Minguard for bed mobility and minimal cues to maintain precautions. Transfers Transfers: Sit to Stand;Stand to Sit Sit to Stand: 4: Min guard;From bed Stand to Sit: 4: Min guard;To bed Details for Transfer Assistance: Minguard for safety.     Exercises      Balance     End of Session OT - End  of Session Equipment Utilized During Treatment: Gait belt;Back brace Activity Tolerance: Patient tolerated treatment well Patient left: in bed;with call bell/phone within reach;with nursing in room Nurse Communication: Mobility status  GO     Earlie Raveling OTR/L [phone] 02/03/2013, 11:11 AM

## 2013-02-03 NOTE — Progress Notes (Signed)
Physical Therapy Treatment Patient Details Name: Dung Prien MRN: [id number] DOB: 30-Nov-1980 Today's Date: 02/03/2013 Time: 1000-1025 PT Time Calculation (min): 25 min  PT Assessment / Plan / Recommendation Comments on Treatment Session  Pt continues to be highly motivated and willing to work with therapy to increase independence to d/c home to take care of kids. Pt continues to agree to CIR to be completely independent and d/c burden on family prior to d/c home.    Follow Up Recommendations  CIR     Equipment Recommendations  None recommended by PT    Frequency Min 5X/week   Plan Discharge plan remains appropriate;Frequency remains appropriate    Precautions / Restrictions Precautions Precautions: Back;Fall Precaution Comments: pt indpendently recalls 3/3 back precautions  Required Braces or Orthoses: Spinal Brace Spinal Brace: Thoracolumbosacral orthotic Restrictions Weight Bearing Restrictions: No   Pertinent Vitals/Pain C/o 10/10 LBP with mobility but able to continue with therapy per pt    Mobility  Bed Mobility Bed Mobility: Rolling Left;Left Sidelying to Sit;Sit to Sidelying Left Rolling Left: 4: Min guard;With rail Left Sidelying to Sit: 4: Min guard Sit to Sidelying Left: 4: Min guard Details for Bed Mobility Assistance: Minguard for bed mobility and minimal cues to maintain precautions. Transfers Transfers: Sit to Stand;Stand to Sit Sit to Stand: 4: Min guard;From bed Stand to Sit: 4: Min guard;To bed Details for Transfer Assistance: Minguard for safety.  Ambulation/Gait Ambulation/Gait Assistance: 4: Min guard;4: Min Environmental consultant (Feet): 125 Feet Assistive device: Rolling walker Ambulation/Gait Assistance Details: VCs for step sequence, increase gait speed and safety with RW while maitnaining back precautions Gait Pattern: Step-through pattern;Wide base of support Gait velocity: decreased; unable to safely increase at this time due to pain  and balance deficits  Stairs: No Wheelchair Mobility Wheelchair Mobility: No    Exercises     PT Diagnosis:    PT Problem List:   PT Treatment Interventions:     PT Goals Acute Rehab PT Goals PT Goal Formulation: With patient Time For Goal Achievement: 02/06/13 Potential to Achieve Goals: Good Pt will Roll Supine to Right Side: with modified independence PT Goal: Rolling Supine to Right Side - Progress: Progressing toward goal Pt will go Sit to Supine/Side: with modified independence PT Goal: Sit to Supine/Side - Progress: Progressing toward goal Pt will go Stand to Sit: with modified independence PT Goal: Stand to Sit - Progress: Progressing toward goal Pt will Ambulate: >150 feet;with least restrictive assistive device;with supervision PT Goal: Ambulate - Progress: Progressing toward goal  Visit Information  Last PT Received On: 02/03/13 Assistance Needed: +1    Subjective Data  Subjective: "I hurts bad but I want to keep walking." Patient Stated Goal: take care of family   Cognition  Cognition Arousal/Alertness: Awake/alert Behavior During Therapy: WFL for tasks assessed/performed Overall Cognitive Status: Within Functional Limits for tasks assessed    Balance     End of Session PT - End of Session Equipment Utilized During Treatment: Gait belt;Back brace Activity Tolerance: Patient tolerated treatment well Patient left: Other (comment) (standing in room with OT) Nurse Communication: Mobility status   GP     Talley Kreiser 02/03/2013, 11:12 AM Jake Shark, PT DPT [phone]

## 2013-02-03 NOTE — Plan of Care (Signed)
Overall Plan of Care [facility]) Patient Details Name: [name] MRN: [id number] DOB: 1980/12/26  Diagnosis:  Lumbar HNP with radiculopathy at L5  Co-morbidities: pain, obesity  Functional Problem List  Patient demonstrates impairments in the following areas: Balance, Cognition, Endurance, Medication Management, Pain, Safety, Sensory  and Skin Integrity  Basic ADL's: grooming, bathing, dressing and toileting Advanced ADL's: simple meal preparation, laundry and light housekeeping  Transfers:  bed mobility, bed to chair, toilet, tub/shower, car and furniture Locomotion:  ambulation, wheelchair mobility and stairs  Additional Impairments:  Leisure Awareness  Anticipated Outcomes Item Anticipated Outcome  Eating/Swallowing    Basic self-care  Mod I  Tolieting  Mod I  Bowel/Bladder  Cont of bowel and bladder mod I  Transfers  Mod I  Locomotion  Mod I with RW  Communication    Cognition    Pain  Pain level less than 4  Safety/Judgment    Other     Therapy Plan: PT Intensity: Minimum of 1-2 x/day ,45 to 90 minutes PT Frequency: 5 out of 7 days PT Duration Estimated Length of Stay: 7-10 days  OT Intensity: Minimum of 1-2 x/day, 45 to 90 minutes OT Frequency: 5 out of 7 days OT Duration/Estimated Length of Stay: 7-10 days      Team Interventions: Item RN PT OT SLP SW TR Other  Self Care/Advanced ADL Retraining  x x      Neuromuscular Re-Education  x x      Therapeutic Activities  x x      UE/LE Strength Training/ROM  x x      UE/LE Coordination Activities  x x      Visual/Perceptual Remediation/Compensation         DME/Adaptive Equipment Instruction  x x      Therapeutic Exercise  x x      Balance/Vestibular Training  x x      Patient/Family Education x x x      Cognitive Remediation/Compensation         Functional Mobility Training  x x      Ambulation/Gait Training  x       Stair Training  x       Wheelchair Propulsion/Positioning  x x      Functional  Tourist information centre manager Reintegration  x x      Dysphagia/Aspiration Film/video editor         Bladder Management x        Bowel Management x        Disease Management/Prevention x x x      Pain Management x x x      Medication Management x        Skin Care/Wound Management x x x      Splinting/Orthotics  x x      Discharge Planning  x x      Psychosocial Support  x x                             Team Discharge Planning: Destination: PT-Home ,OT- Home , SLP-  Projected Follow-up: PT-Home health PT, OT-  None, SLP-  Projected Equipment Needs: PT-None recommended by PT (pt owns RW ), OT- Tub/shower bench, SLP-  Patient/family involved in discharge planning: PT- Patient;Other (Comment) (friend),  OT-Patient, SLP-   MD ELOS: 7-10 days Medical Rehab Prognosis:  Excellent Assessment: The patient has been admitted for CIR therapies. The team will be addressing, functional mobility, strength, stamina, balance, safety, adaptive techniques/equipment, self-care, bowel and bladder mgt, patient and caregiver education, NMR, pain mgt, donning and doffing of brace. Goals have been set at Cedric Fishman, MD, [facility]      See Team Conference Notes for weekly updates to the plan of care

## 2013-02-03 NOTE — Discharge Summary (Signed)
Patient ID: [name] MRN: [id number] DOB/AGE: 01-25-1981 31 y.o.  Admit date: 01/28/2013 Discharge date: 02/03/2013  Admission Diagnoses: Radiculopathy  Discharge Diagnoses: Same, improved s/p surgery  Past Medical History  Diagnosis Date  . Neck pain   . Migraines   . Depression   . Anxiety     relative to circumstances to back problems/injury, not taking any medicine, pt. reports has been crying a lot  . Asthma   . GERD (gastroesophageal reflux disease)   . Arthritis     low back    Surgeries: Procedure(s): REVISION POSTERIOR LUMBAR FUSION L4-5 TLIF LEVEL on 01/28/2013 Revision R L5 pedicle screw 01/29/2013   Consultants: [facility]  Discharged Condition: Improved  Hospital Course: [name] is an 33 y.o. 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 Patient has severe unremitting pain that affects sleep, daily activities, and work/hobbies. After pre-op clearance the patient was taken to the operating room on 01/28/2013 - 01/29/2013 and underwent  Procedure(s): REVISION POSTERIOR LUMBAR FUSION L4-5 TLIF and R L5 pedicle screw revision.    Patient was given perioperative antibiotics: Anti-infectives   Start     Dose/Rate Route Frequency Ordered Stop   01/29/13 2200  ceFAZolin (ANCEF) IVPB 1 g/50 mL premix     1 g 100 mL/hr over 30 Minutes Intravenous Every 8 hours 01/29/13 1831 01/30/13 0616   01/29/13 1404  ceFAZolin (ANCEF) 2-3 GM-% IVPB SOLR    Comments:  WEAVER, HOLLY: cabinet override      01/29/13 1404 01/29/13 1423   01/28/13 2300  ceFAZolin (ANCEF) IVPB 1 g/50 mL premix     1 g 100 mL/hr over 30 Minutes Intravenous Every 8 hours 01/28/13 2127 01/29/13 0630   01/28/13 1335  ceFAZolin (ANCEF) 2-3 GM-% IVPB SOLR    Comments:  CARTER, JENNIFER: cabinet override      01/28/13 1335 01/29/13 0144   01/28/13 0600  ceFAZolin (ANCEF) 2 g in dextrose 5 % 50 mL IVPB     2 g 140 mL/hr over 30  Minutes Intravenous On call to O.R. 01/27/13 1429 01/28/13 1704       Patient was given sequential compression devices, early ambulation to prevent DVT.  Patient benefited maximally from hospital stay and there were no complications.    Recent vital signs: Patient Vitals for the past 24 hrs:  BP Temp Temp src Pulse Resp SpO2  02/03/13 0614 111/75 mmHg 98.1 F (36.7 C) Oral 84 18 98 %  02/02/13 2103 99/67 mmHg 98.4 F (36.9 C) - 82 18 97 %  02/02/13 1304 94/58 mmHg 98.6 F (37 C) - 82 18 100 %     Discharge Medications:     Medication List    STOP taking these medications       HYDROcodone-acetaminophen 10-325 MG per tablet  Commonly known as:  NORCO     meloxicam 15 MG tablet  Commonly known as:  MOBIC      TAKE these medications       albuterol 108 (90 BASE) MCG/ACT inhaler  Commonly known as:  PROVENTIL HFA;VENTOLIN HFA  Inhale 2 puffs into the lungs every 4 (four) hours as needed for wheezing.     calcium carbonate 750 MG chewable tablet  Commonly known as:  TUMS EX  Chew 1 tablet by mouth 2 (two) times daily as needed for heartburn.     chlorpheniramine 4 MG tablet  Commonly known as:  CHLOR-TRIMETON  Take 4  mg by mouth every 4 (four) hours as needed for allergies or rhinitis (seasonal allergies).     gabapentin 100 MG capsule  Commonly known as:  NEURONTIN  Take 1 capsule (100 mg total) by mouth 3 (three) times daily.       Written scripts for percocet and valium in pts chart.  Diagnostic Studies: Dg Lumbar Spine 2-3 Views  01/31/2013   *RADIOLOGY REPORT*  Clinical Data: Status post fusion.  Standing films without brace.  LUMBAR SPINE - 2-3 VIEW  Comparison: 01/29/2013, plain films and CT  Findings: Numbering is based on prior CT exam.  Patient has had posterior fusion at L4-5.  No acute fracture or subluxation.  No suspicious lytic or blastic lesions identified.  Regional bowel gas pattern is nonobstructive.  IMPRESSION: Status post posterior fusion.   Normal alignment.   Original Report Authenticated By: Norva Pavlov, M.D.   Dg Lumbar Spine 2-3 Views  01/29/2013   *RADIOLOGY REPORT*  Clinical Data: PLIF screw revision  DG C-ARM 1-60 MIN,LUMBAR SPINE - 2-3 VIEW  Technique:  C-arm fluoroscopic images were obtained intraoperatively and submitted for postoperative interpretation. Please see the performing provider's procedural report for the fluoroscopy time utilized.  Fluoroscopy time:  18 seconds  Comparison: CT lumbar spine dated 01/29/2013  Findings: Single frontal and lateral fluoroscopic images were obtained during PLIF screw revision.  Surgical hardware at L4-5.  The right L5 pedicle screw has been revised and appears to be in satisfactory position.  IMPRESSION: Intraoperative fluoroscopic spot images during L4-5 PLIF screw revision, as described above.   Original Report Authenticated By: Charline Bills, M.D.   Dg Lumbar Spine 2-3 Views  01/28/2013   *RADIOLOGY REPORT*  Clinical Data: L4-5 PLIF  DG C-ARM GT 120 MIN,LUMBAR SPINE - 2-3 VIEW  Technique: AP and lateral C-arm images were obtained of the lumbar spine  Comparison:  Lumbar MRI 07/31/2012  Findings: Bilateral pedicle screw and interbody fusion at L4-5. Interbody spacer in good position.  There is gauze in the posterior soft tissues.  IMPRESSION: Pedicle screw interbody fusion L4-5.  Gauze is noted in the posterior soft tissues.   Original Report Authenticated By: Janeece Riggers, M.D.   Dg Lumbar Spine 2-3 Views  01/28/2013   *RADIOLOGY REPORT*  Clinical Data: Posterior lumbar fusion at L4-L5.  LUMBAR SPINE - 2-3 VIEW  Comparison: MRI of 07/31/2012  Findings: Single lateral view, labeled 1416 hours.  Presuming the nomenclature from the prior MRI, the surgical device projects posterior to the L4-L5 intervertebral level.  IMPRESSION: Intraoperative imaging localizing the L4-L5 vertebral body.  This presumes the same nomenclature as the 07/31/2012 MRI ( i.e. transitional L5 vertebral body).    Original Report Authenticated By: Jeronimo Greaves, M.D.   Ct Lumbar Spine Wo Contrast  01/29/2013   *RADIOLOGY REPORT*  Clinical Data: Evaluate hardware placement following L4-5 posterolateral and interbody fusion. The left leg pain improved. The patient complains of low back and right leg pain, new and unexpected.  CT LUMBAR SPINE WITHOUT CONTRAST  Technique:  Multidetector CT imaging of the lumbar spine was performed without intravenous contrast administration. Multiplanar CT image reconstructions were also generated.  Comparison: Intraoperative C-arm films 01/28/2013.  MRI lumbar spine 07/31/2012.  Findings: The patient has undergone a transforaminal lumbar interbody fusion via left-sided approach with placement of bilateral pedicle screws at L4 and L5. The alignment is anatomic. Moderate air is noted in the soft tissues and spinal canal along the course of the L4-5 interbody graft placement.  This is  an expected postoperative finding.  The right L5 screw is medially/inferiorly located and crosses the superior aspect of the neural foramen (see images 79 - 84 series 3, image 15 series [id number], image 24 series 84696). This could affect the right L5 nerve root in the foramen. The right S1 nerve root appears medial to the course of the screw.  The other pedicle screws appear appropriately located.  There is no vertebral body fracture.  The other lumbar intervertebral disc spaces appear unremarkable.  IMPRESSION: Satisfactory placement of L4-5 interbody cage via TLIF approach.  Right L5 pedicle screw is medially/inferiorly located,  and enters the superior aspect of the  neural foramen.  Right L5 nerve root impingement is possible.   Original Report Authenticated By: Davonna Belling, M.D.   Dg C-arm 1-60 Min  01/29/2013   *RADIOLOGY REPORT*  Clinical Data: PLIF screw revision  DG C-ARM 1-60 MIN,LUMBAR SPINE - 2-3 VIEW  Technique:  C-arm fluoroscopic images were obtained intraoperatively and submitted for postoperative  interpretation. Please see the performing provider's procedural report for the fluoroscopy time utilized.  Fluoroscopy time:  18 seconds  Comparison: CT lumbar spine dated 01/29/2013  Findings: Single frontal and lateral fluoroscopic images were obtained during PLIF screw revision.  Surgical hardware at L4-5.  The right L5 pedicle screw has been revised and appears to be in satisfactory position.  IMPRESSION: Intraoperative fluoroscopic spot images during L4-5 PLIF screw revision, as described above.   Original Report Authenticated By: Charline Bills, M.D.   Dg C-arm Gt 120 Min  01/28/2013   *RADIOLOGY REPORT*  Clinical Data: L4-5 PLIF  DG C-ARM GT 120 MIN,LUMBAR SPINE - 2-3 VIEW  Technique: AP and lateral C-arm images were obtained of the lumbar spine  Comparison:  Lumbar MRI 07/31/2012  Findings: Bilateral pedicle screw and interbody fusion at L4-5. Interbody spacer in good position.  There is gauze in the posterior soft tissues.  IMPRESSION: Pedicle screw interbody fusion L4-5.  Gauze is noted in the posterior soft tissues.   Original Report Authenticated By: Janeece Riggers, M.D.    Disposition: [facility]   S/P  L L4-5 TLIF for L leg px & removal and reinsertion of R L5 pedicle screw   - TLSO brace at all times when OOB   - Back precautions   - Cont SCD's   - CIR when approved by insurance. May need hospital bed prior to return home.   - will add zofran for nausea   - Written prescriptions for Percocet, neurontin,and Valium in the pts chart   - F/U appt card in chart with D/C instructions  Signed: Georga Bora 02/03/2013, 8:51 AM

## 2013-02-03 NOTE — Progress Notes (Signed)
Admitting patient to CIR today. Have received authorization for CIR from pt's workers comp. For questions, call [phone]

## 2013-02-03 NOTE — H&P (Signed)
Physical Medicine and Rehabilitation Admission H&P  No chief complaint on file.  :  HPI: [name] is a 32 y.o. right-handed female with history of left-sided L4-5 microdiscectomy 06/05/2012. Admitted 01/28/2013 with recurrent back pain radiating to the lower extremities. 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 Underwent revision L4-5 decompression with removal of left sided disc fragment that was causing compression of the transversing L5 nerve/transforaminal lumbar interbody fusion 01/29/2013. Postoperative day 1 with increasing right lower extremity pain. CT scan reveals slightly medially deviated right L5 screw. Underwent removal of right L5 pedicle screw with reinsertion of screw to a more lateral position 01/30/2013 per Dr. Yevette Edwards. Postoperative pain management. Patient was fitted with a TLSO orthotic brace. Physical and occupational therapy evaluation completed 01/30/2013 with recommendations of physical medicine rehabilitation consult to consider inpatient rehabilitation services. Patient was felt to be a good candidate for inpatient rehabilitation services was admitted for comprehensive rehabilitation program  Review of Systems  Gastrointestinal:  GERD  Musculoskeletal: Positive for back pain.  Neurological: Positive for headaches.  Psychiatric/Behavioral: Positive for depression.  Anxiety  All other systems reviewed and are negative  Past Medical History   Diagnosis  Date   .  Neck pain    .  Migraines    .  Depression    .  Anxiety      relative to circumstances to back problems/injury, not taking any medicine, pt. reports has been crying a lot   .  Asthma    .  GERD (gastroesophageal reflux disease)    .  Arthritis      low back    Past Surgical History   Procedure  Laterality  Date   .  Lumbar laminectomy/decompression microdiscectomy   06/05/2012     Procedure: LUMBAR LAMINECTOMY/DECOMPRESSION MICRODISCECTOMY; Surgeon: Emilee Hero, MD; Location: [facility] OR; Service: Orthopedics; Laterality: Left; Left sided lumbar 4-5 microdisectomy   .  Childbirth       x4 vaginal    History reviewed. No pertinent family history.  Social History: reports that she has never smoked. She has never used smokeless tobacco. She reports that she does not drink alcohol or use illicit drugs.  Allergies:  Allergies   Allergen  Reactions   .  Hydrocodone-Acetaminophen      Has some itching on face & nose but not bad enough to stop taking it/ MD aware and instructed patient to take anti-allergy medication prn.    Medications Prior to Admission   Medication  Sig  Dispense  Refill   .  albuterol (PROVENTIL HFA;VENTOLIN HFA) 108 (90 BASE) MCG/ACT inhaler  Inhale 2 puffs into the lungs every 4 (four) hours as needed for wheezing.     .  calcium carbonate (TUMS EX) 750 MG chewable tablet  Chew 1 tablet by mouth 2 (two) times daily as needed for heartburn.     .  chlorpheniramine (CHLOR-TRIMETON) 4 MG tablet  Take 4 mg by mouth every 4 (four) hours as needed for allergies or rhinitis (seasonal allergies).     .  [DISCONTINUED] HYDROcodone-acetaminophen (NORCO) 10-325 MG per tablet  Take 1-2 tablets by mouth every 4 (four) hours as needed for pain.     .  [DISCONTINUED] meloxicam (MOBIC) 15 MG tablet  Take 15 mg by mouth daily.      Home:  Home Living  Lives With: Family;Other (Comment) (has childern ages 50,5,10,14)  Available Help at Discharge: Family;Available PRN/intermittently;Other (Comment) (husband works at least 8 hours/day)  Type of Home: Apartment  Home Access: Level entry  Home Layout: Two level;Bed/bath upstairs  Alternate Level Stairs-Number of Steps: 12  Alternate Level Stairs-Rails: Right  Bathroom Shower/Tub: Tub/shower unit;Curtain  Firefighter: (has 3 in 1 she uses primarily )  Bathroom Accessibility: Yes  How Accessible: Accessible via walker  Home Adaptive Equipment: Bedside commode/3-in-1;Walker - rolling   Functional History:  Prior Function  Bath: Maximal  Toileting: Other (comment) (states she can transfer to 3 in 1)  Dressing: Maximal  Meal Prep: Total  Light Housekeeping: Total  Able to Take Stairs?: Yes (with assistance )  Driving: No  Vocation: On disability  Functional Status:  Mobility:  Bed Mobility  Bed Mobility: Rolling Right;Rolling Left;Left Sidelying to Sit  Rolling Right: 4: Min assist;With rail (c/o pain )  Rolling Left: 4: Min assist;With rail  Right Sidelying to Sit: 4: Min assist;With rails;HOB elevated  Left Sidelying to Sit: 4: Min assist;HOB flat;With rails  Sit to Sidelying Right: HOB elevated;4: Min assist  Transfers  Transfers: Sit to Stand;Stand to Sit  Sit to Stand: 4: Min assist;With upper extremity assist;From bed;From elevated surface  Stand to Sit: 3: Mod assist;To chair/3-in-1;With armrests  Stand Pivot Transfers: 3: Mod assist  Ambulation/Gait  Ambulation/Gait Assistance: 4: Min assist  Ambulation Distance (Feet): 60 Feet  Assistive device: Rolling walker  Ambulation/Gait Assistance Details: verbal cues for gt sequencing; pt demo difficulty advancing R LE at times due to pain; amb with decreased step length on L LE due to inability to WB equally on R LE  Gait Pattern: Step-through pattern;Wide base of support (R LE Externally rotated)  Gait velocity: decreased; unable to safely increase at this time due to pain and balance deficits  Stairs: Yes  Stairs Assistance: 4: Min assist  Stairs Assistance Details (indicate cue type and reason): (A) for safety due to balance deficits and increased pain; required cues for propr gt sequencing  Stair Management Technique: Forwards;Step to pattern;With walker  Number of Stairs: 1  Wheelchair Mobility  Wheelchair Mobility: No  ADL:  ADL  Grooming: Performed;Min guard  Where Assessed - Grooming: Supported standing  Upper Body Bathing: Simulated;Minimal assistance  Where Assessed - Upper Body Bathing:  Supine, head of bed up  Lower Body Bathing: Simulated;+1 Total assistance  Where Assessed - Lower Body Bathing: Rolling right and/or left;Supine, head of bed flat  Upper Body Dressing: Performed;Maximal assistance (brace)  Where Assessed - Upper Body Dressing: Supported sit to stand  Lower Body Dressing: Simulated;+1 Total assistance  Where Assessed - Lower Body Dressing: Supine, head of bed flat;Rolling right and/or left  Toilet Transfer: Performed;Min guard  Toilet Transfer Method: Sit to Production manager: Raised toilet seat with arms (or 3-in-1 over toilet)  Transfers/Ambulation Related to ADLs: Patient performed bed mobility with min A and cues, sit to stand from bed and toilet with min guard A, and ambulated to bathroom and then to recliner with min guard A and RW.  ADL Comments: Reviewed back precautions with pt.  Cognition:  Cognition  Overall Cognitive Status: Within Functional Limits for tasks assessed  Arousal/Alertness: Awake/alert  Orientation Level: Oriented X4  Cognition  Arousal/Alertness: Awake/alert  Behavior During Therapy: WFL for tasks assessed/performed  Overall Cognitive Status: Within Functional Limits for tasks assessed    Physical Exam:  Blood pressure 97/60, pulse 84, temperature 97.8 F (36.6 C), temperature source Oral, resp. rate 16, height 5' 4.8" (1.646 m), weight 90.8 kg (200 lb 2.8 oz), last menstrual period  12/01/2012, SpO2 98.00%.    Constitutional: She is oriented to person, place, and time. NAD Obese  HENT:  Head: Normocephalic and atraumatic.  Right Ear: External ear normal.  Left Ear: External ear normal.  Eyes: EOM are normal.  Neck: Normal range of motion. Neck supple. No JVD present. No tracheal deviation present. Thyromegaly present.  Cardiovascular: Normal rate and regular rhythm.  Pulmonary/Chest: Effort normal and breath sounds normal. No respiratory distress.  Abdominal: Soft. Bowel sounds are normal. She exhibits no  distension.  Lymphadenopathy:  She has no cervical adenopathy.  Neurological: [name] and oriented to person, place, and time. She displays normal reflexes.  UE near 5/5. RLE is 3-4/5 prox to 4/5 distal with ADF and APF. Mild sensory loss over right anterior foot and leg. LLE is 4/5 prox to 5/5 distally.  Skin:  Back brace on chair. Wound dressed.  Psychiatric: She has a normal mood and affect. Her behavior is normal. Judgment and thought content normal  No results found for this or any previous visit (from the past 48 hour(s)).  Dg Lumbar Spine 2-3 Views  01/31/2013 *RADIOLOGY REPORT* Clinical Data: Status post fusion. Standing films without brace. LUMBAR SPINE - 2-3 VIEW Comparison: 01/29/2013, plain films and CT Findings: Numbering is based on prior CT exam. Patient has had posterior fusion at L4-5. No acute fracture or subluxation. No suspicious lytic or blastic lesions identified. Regional bowel gas pattern is nonobstructive. IMPRESSION: Status post posterior fusion. Normal alignment. Original Report Authenticated By: Norva Pavlov, M.D.   Post Admission Physician Evaluation:  1. 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 2. Patient is admitted to receive collaborative, interdisciplinary care between the physiatrist, rehab nursing staff, and therapy team. 3. Patient's level of medical complexity and substantial therapy needs in context of that medical necessity cannot be provided at a lesser intensity of care such as a SNF. 4. Patient has experienced substantial functional loss from his/her baseline which was documented above under the "Functional History" and "Functional Status" headings. Judging by the patient's diagnosis, physical exam, and functional history, the patient has potential for functional progress which will result in measurable gains while on inpatient rehab. These gains will be of substantial and practical use upon  discharge in facilitating mobility and self-care at the household level. 5. Physiatrist will provide 24 hour management of medical needs as well as oversight of the therapy plan/treatment and provide guidance as appropriate regarding the interaction of the two. 6. 24 hour rehab nursing will assist with bladder management, bowel management, safety, skin/wound care, disease management, medication administration, pain management and patient education and help integrate therapy concepts, techniques,education, etc. 7. PT will assess and treat for/with: Lower extremity strength, range of motion, stamina, balance, functional mobility, safety, adaptive techniques and equipment, brace wear, pain mgt. Goals are: mod I to supervision. 8. OT will assess and treat for/with: ADL's, functional mobility, safety, upper extremity strength, adaptive techniques and equipment, NMR, brace donning and doffing. Goals are: mod I to min assist (brace). 9. SLP will assess and treat for/with: n/a. Goals are: n/a. 10. Case Management and Social Worker will assess and treat for psychological issues and discharge planning. 11. Team conference will be held weekly to assess progress toward goals and to determine barriers to discharge. 12. Patient will receive at least 3 hours of therapy per day at least 5 days per week. 13. ELOS: 7-10 days Prognosis: excellent Medical Problem List and Plan:  1. Lumbar HNP  with L5 radiculopathy  2. DVT Prophylaxis/Anticoagulation: SCDs. Check vascular studies  3. Pain Management: Neurontin 100 mg 3 times a day, Robaxin and Percocet as needed. Monitor with increased mobility  4. Mood/history of depression. No antidepressants prior to admission. Provide emotional support  5. Neuropsych: This patient is capable of making decisions on his/her own behalf.            Ranelle Oyster, MD, [facility] Physical Medicine & Rehabilitation  02/02/2013

## 2013-02-03 NOTE — Progress Notes (Signed)
Pt arrived from [facility] around 1730

## 2013-02-03 NOTE — Progress Notes (Signed)
S/P L L4-5 TLIF for L leg px and removal and reinsertion of R L5 pedicle screw. Her back and leg pain continue to improve. L leg has some numbness, R leg has some mild pain and burning in lateral hip and thigh, nothing below the knee and she clearly states this is improving every day and not a significant concern for her, she is excited about CIR.   BP 111/75  Pulse 84  Temp(Src) 98.1 F (36.7 C) (Oral)  Resp 18  Ht 5' 4.8" (1.646 m)  Wt 90.8 kg (200 lb 2.8 oz)  BMI 33.51 kg/m2  SpO2 98%  LMP 12/01/2012 Pt laying comfortably in hospital bed, TLSO brace at bedside. Sensation intact BIL LEs, neurovascularly intact, SCD's in place, -Homans.   A/P: s/p L L4-5 TLIF for L leg px & removal and reinsertion of R L5 pedicle screw   - TLSO brace at all times when OOB  - Back precautions  - Cont SCD's  - D/C to CIR today - Written prescriptions for Percocet, neurontin,and Valium in the pts chart  - F/U appt card in chart with D/C instructions

## 2013-02-03 NOTE — PMR Pre-admission (Signed)
PMR Admission Coordinator Pre-Admission Assessment  Patient: [name] is an 32 y.o., female MRN: [id number] DOB: December 25, 1980 Height: 5' 4.8" (164.6 cm) Weight: 90.8 kg (200 lb 2.8 oz)              Insurance Information HMO:     PPO:      PCP:      IPA:      80/20:      OTHER: yes PRIMARY: Workers Compensation: Orthoptist      Policy#: [id number]      Subscriber: self CM Name: Liliane Channel      Phone#: [phone]     Fax#:  Pre-Cert#: (No # available at this time) Authorization via fax from [employer], Claims Team Manager [phone] x [phone])        Employer: [employer] Benefits:  Phone #[phone]      Name: Altha Harm. Date:01/16/12     Emergency Contact Information Contact Information   Name Relation Home Work Mobile   [place] Significant other [phone]       Current Medical History  Patient Admitting Diagnosis: Lumbar HNP with L5 radiculopathy  History of Present Illness: 32 y.o. right-handed female with history of left-sided L4-5 microdiscectomy 06/05/2012. Admitted 01/28/2013 with recurrent back pain radiating to the lower extremities. 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 Underwent revision L4-5 decompression with removal of left sided disc fragment that was causing compression of the transversing L5 nerve/transforaminal lumbar interbody fusion 01/29/2013. Postoperative day 1 with increasing right lower extremity pain. CT scan reveals slightly medially deviated right L5 screw. Underwent removal of right L5 pedicle screw with reinsertion of screw to a more lateral position 01/30/2013 per Dr. Yevette Edwards. Postoperative pain management. Patient was fitted with a TLSO orthotic brace.     Past Medical History  Past Medical History  Diagnosis Date  . Neck pain   . Migraines   . Depression   . Anxiety     relative to circumstances to back problems/injury, not taking any medicine, pt.  reports has been crying a lot  . Asthma   . GERD (gastroesophageal reflux disease)   . Arthritis     low back   Family History  family history is not on file.  Prior Rehab/Hospitalizations: None  Current Medications  Current facility-administered medications:0.9 %  sodium chloride infusion, 250 mL, Intravenous, Continuous, Kayla J McKenzie, PA-C;  0.9 %  sodium chloride infusion, , Intravenous, Continuous, Kayla J McKenzie, PA-C, Last Rate: 75 mL/hr at 01/30/13 0459;  acetaminophen (TYLENOL) suppository 650 mg, 650 mg, Rectal, Q4H PRN, Eilene Ghazi McKenzie, PA-C acetaminophen (TYLENOL) tablet 650 mg, 650 mg, Oral, Q4H PRN, Eilene Ghazi McKenzie, PA-C, 650 mg at 01/30/13 0400;  alum & mag hydroxide-simeth (MAALOX/MYLANTA) 200-200-20 MG/5ML suspension 30 mL, 30 mL, Oral, Q6H PRN, Eilene Ghazi McKenzie, PA-C;  bisacodyl (DULCOLAX) EC tablet 5 mg, 5 mg, Oral, Daily PRN, Eilene Ghazi McKenzie, PA-C, 5 mg at 02/02/13 2041;  diazepam (VALIUM) tablet 5 mg, 5 mg, Oral, Q6H PRN, Alessandra Bevels. Williams, PA-C, 5 mg at 02/03/13 8756 docusate sodium (COLACE) capsule 100 mg, 100 mg, Oral, BID, Eilene Ghazi McKenzie, PA-C, 100 mg at 02/03/13 1033;  gabapentin (NEURONTIN) capsule 100 mg, 100 mg, Oral, TID, Emilee Hero, MD, 100 mg at 02/03/13 1033;  menthol-cetylpyridinium (CEPACOL) lozenge 3 mg, 1 lozenge, Oral, PRN, Eilene Ghazi McKenzie, PA-C;  morphine 2 MG/ML injection 1-4 mg, 1-4 mg, Intravenous, Q3H PRN, Eilene Ghazi McKenzie, PA-C ondansetron (ZOFRAN)  injection 4 mg, 4 mg, Intravenous, Q6H PRN, Emilee Hero, MD, 4 mg at 02/02/13 1146;  ondansetron [facility]) tablet 4 mg, 4 mg, Oral, Q6H PRN, Emilee Hero, MD;  oxyCODONE-acetaminophen (PERCOCET/ROXICET) 5-325 MG per tablet 1-2 tablet, 1-2 tablet, Oral, Q4H PRN, Eilene Ghazi McKenzie, PA-C, 2 tablet at 02/03/13 1033;  phenol (CHLORASEPTIC) mouth spray 1 spray, 1 spray, Mouth/Throat, PRN, Eilene Ghazi McKenzie, PA-C senna-docusate (Senokot-S) tablet 1 tablet, 1 tablet, Oral, QHS  PRN, Eilene Ghazi McKenzie, PA-C;  sodium chloride 0.9 % injection 3 mL, 3 mL, Intravenous, Q12H, Kayla J McKenzie, PA-C, 3 mL at 02/02/13 2044;  sodium chloride 0.9 % injection 3 mL, 3 mL, Intravenous, PRN, Eilene Ghazi McKenzie, PA-C;  zolpidem (AMBIEN) tablet 5 mg, 5 mg, Oral, QHS PRN, Eilene Ghazi McKenzie, PA-C  Patients Current Diet: General  Precautions / Restrictions Precautions Precautions: Back;Fall Precaution Booklet Issued: Yes (comment) Precaution Comments: pt indpendently recalls 3/3 back precautions  Spinal Brace: Thoracolumbosacral orthotic Restrictions Weight Bearing Restrictions: No   Prior Activity Level Community (5-7x/wk): Active daily Home Assistive Devices / Equipment Home Assistive Devices/Equipment: Environmental consultant (specify type) (ROLLING WALKER) Home Adaptive Equipment: Bedside commode/3-in-1;Walker - rolling  Prior Functional Level Prior Function Level of Independence: Needs assistance Needs Assistance: Dressing;Bathing;Light Housekeeping;Toileting;Meal Prep;Gait;Transfers Bath: Maximal Dressing: Maximal Toileting: Other (comment) (states she can transfer to 3 in 1) Meal Prep: Total Light Housekeeping: Total Gait Assistance: requires assistance for longer distances and RW  Transfer Assistance: states husband or son helps her get in/out of bed. Able to Take Stairs?: Yes (with assistance ) Driving: No Vocation: On disability  Current Functional Level Cognition  Arousal/Alertness: Awake/alert Overall Cognitive Status: Within Functional Limits for tasks assessed Orientation Level: Oriented X4    Extremity Assessment (includes Sensation/Coordination)  RUE ROM/Strength/Tone: WFL for tasks assessed  RLE ROM/Strength/Tone: Unable to fully assess;Due to pain (able to perform LAQ; cannot tolerate resistance) RLE Sensation:  (states her R LE feels "numb")    ADLs  Grooming: Performed;Min guard Where Assessed - Grooming: Supported standing Upper Body Bathing: Simulated;Minimal  assistance Where Assessed - Upper Body Bathing: Supine, head of bed up Lower Body Bathing: Simulated;+1 Total assistance Where Assessed - Lower Body Bathing: Rolling right and/or left;Supine, head of bed flat Upper Body Dressing: Performed;Maximal assistance (brace) Where Assessed - Upper Body Dressing: Supported sit to stand Lower Body Dressing: Performed;Minimal assistance Where Assessed - Lower Body Dressing: Supported sit to Pharmacist, hospital: Counsellor Method: Sit to Barista: Other (comment) (from bed) Toileting - Clothing Manipulation and Hygiene: Performed;Min guard Where Assessed - Glass blower/designer Manipulation and Hygiene: Sit to stand from 3-in-1 or toilet Equipment Used: Back brace;Rolling walker;Reacher;Sock aid Transfers/Ambulation Related to ADLs: Patient performed bed mobility with min A and cues, sit to stand from bed and toilet with min guard A, and ambulated to bathroom and then to recliner with min guard A and RW. ADL Comments: Pt donned denim shorts with reacher and doffed them as well with minimal assistance to get completely off feet and cues to maintain precautions during task. Pt verbalized that toilet hygiene is difficult for her to perform while maintaining precautions. Cues for pt to maintain precautions during session.     Mobility  Bed Mobility: Rolling Left;Left Sidelying to Sit;Sit to Sidelying Left Rolling Right: 4: Min assist;With rail (c/o pain ) Rolling Left: 4: Min guard;With rail Right Sidelying to Sit: 4: Min assist;With rails;HOB elevated Left Sidelying to Sit: 4: Min guard Sit to Sidelying Right:  HOB elevated;4: Min assist Sit to Sidelying Left: 4: Min guard    Transfers  Transfers: Sit to Stand;Stand to Sit Sit to Stand: 4: Min guard;From bed Stand to Sit: 4: Min guard;To bed Stand Pivot Transfers: 3: Mod assist    Ambulation / Gait / Stairs / Wheelchair Mobility   Ambulation/Gait Ambulation/Gait Assistance: 4: Min guard;4: Min Environmental consultant (Feet): 125 Feet Assistive device: Rolling walker Ambulation/Gait Assistance Details: VCs for step sequence, increase gait speed and safety with RW while maitnaining back precautions Gait Pattern: Step-through pattern;Wide base of support Gait velocity: decreased; unable to safely increase at this time due to pain and balance deficits  Stairs: No Stairs Assistance: 4: Min assist Stairs Assistance Details (indicate cue type and reason): (A) for safety due to balance deficits and increased pain; required cues for propr gt sequencing  Stair Management Technique: Forwards;Step to pattern;With walker Number of Stairs: 1 Wheelchair Mobility Wheelchair Mobility: No    Posture / Balance Static Sitting Balance Static Sitting - Balance Support: Bilateral upper extremity supported;Feet unsupported Static Sitting - Level of Assistance: 5: Stand by assistance Static Sitting - Comment/# of Minutes: tolerated sitting EOB ~10 min Static Standing Balance Static Standing - Balance Support: Bilateral upper extremity supported;During functional activity Static Standing - Level of Assistance: 4: Min assist    Special needs/care consideration Skin: Back incision withgauze dressing. JP drain to suction. Bowel mgmt: LBM 5/26 Bladder mgmt:WDL TLSO brace   Previous Home Environment Living Arrangements: Children;Spouse/significant other Lives With: Family;Other (Comment) (has childern ages 28,5,10,14) Available Help at Discharge: Family;Available PRN/intermittently;Other (Comment) (husband works at least 8 hours/day) Type of Home: Apartment Home Layout: Two level;Bed/bath upstairs Alternate Level Stairs-Rails: Right Alternate Level Stairs-Number of Steps: 12 Home Access: Level entry Bathroom Shower/Tub: Tub/shower unit;Curtain Firefighter:  (has 3 in 1 she uses primarily ) Bathroom Accessibility: Yes How  Accessible: Accessible via walker Home Care Services: No  Discharge Living Setting Plans for Discharge Living Setting: Patient's home Type of Home at Discharge: Apartment Discharge Home Layout: Two level;Bed/bath upstairs Alternate Level Stairs-Rails: Right Alternate Level Stairs-Number of Steps: 12 Discharge Home Access: Level entry Discharge Bathroom Shower/Tub: Tub/shower unit Discharge Bathroom Toilet: Standard Discharge Bathroom Accessibility: Yes How Accessible: Accessible via walker Do you have any problems obtaining your medications?: No  Social/Family/Support Systems Patient Roles: Spouse;Parent Contact Information: [phone] Anticipated Caregiver: Mod Independent goals Anticipated Caregiver's Contact Information: SO/Husband: Franki Cabot: [phone] Caregiver Availability: SO/Husband works very long hours and out of town at times. Discharge Plan Discussed with Primary Caregiver: No Does Caregiver/Family have Issues with Lodging/Transportation while Pt is in Rehab?: No  Goals/Additional Needs Patient/Family Goal for Rehab: Mod independent Expected length of stay: 5-7 days Cultural Considerations: none Dietary Needs: Regular Equipment Needs: TBD Pt/Family Agrees to Admission and willing to participate: Yes Program Orientation Provided & Reviewed with Pt/Caregiver Including Roles  & Responsibilities: Yes  Decrease burden of Care through IP rehab admission: n/a  Possible need for SNF placement upon discharge: no  Patient Condition: This patient's condition remains as documented in the consult dated 02/02/13, in which the Rehabilitation Physician determined and documented that the patient's condition is appropriate for intensive rehabilitative care in an inpatient rehabilitation facility. Will admit to inpatient rehab today.  Preadmission Screen Completed By:  Meryl Dare, 02/03/2013 1:24  PM ______________________________________________________________________   Discussed status with Dr. Riley Kill on 02/03/13 at 1345 and received telephone approval for admission today.  Admission Coordinator:  Meryl Dare, time 1345/Date 02/03/13

## 2013-02-03 NOTE — Progress Notes (Signed)
Met with patient to discuss possible CIR admission. Patient will need to be modified independent and be able to climb stairs in order to d/c to home. Therefore pt would benefit from inpatient rehab. Working on pt's workers Visual merchandiser to verify benefits. Will inform team when/if verification and authorization for CIR is received.  For questions, call [phone]

## 2013-02-04 ENCOUNTER — Inpatient Hospital Stay (HOSPITAL_COMMUNITY): Payer: Worker's Compensation | Admitting: Occupational Therapy

## 2013-02-04 ENCOUNTER — Inpatient Hospital Stay (HOSPITAL_COMMUNITY): Payer: Worker's Compensation

## 2013-02-04 DIAGNOSIS — IMO0002 Reserved for concepts with insufficient information to code with codable children: Secondary | ICD-10-CM

## 2013-02-04 DIAGNOSIS — M5126 Other intervertebral disc displacement, lumbar region: Secondary | ICD-10-CM

## 2013-02-04 DIAGNOSIS — M79609 Pain in unspecified limb: Secondary | ICD-10-CM

## 2013-02-04 HISTORY — DX: Other intervertebral disc displacement, lumbar region: M51.26

## 2013-02-04 MED ORDER — ENSURE COMPLETE PO LIQD
120.0000 mL | Freq: Three times a day (TID) | ORAL | Status: DC
  Administered 2013-02-04 – 2013-02-10 (×22): 120 mL via ORAL

## 2013-02-04 MED ORDER — GABAPENTIN 100 MG PO CAPS
200.0000 mg | ORAL_CAPSULE | Freq: Three times a day (TID) | ORAL | Status: DC
  Administered 2013-02-04 – 2013-02-09 (×17): 200 mg via ORAL
  Filled 2013-02-04 (×21): qty 2

## 2013-02-04 NOTE — Progress Notes (Signed)
Subjective/Complaints: Complains of pain radiating to hips. Back sore A 12 point review of systems has been performed and if not noted above is otherwise negative.   Objective: Vital Signs: Blood pressure 123/96, pulse 87, temperature 98.7 F (37.1 C), temperature source Oral, resp. rate 18, height 5\' 4"  (1.626 m), weight 88.3 kg (194 lb 10.7 oz), last menstrual period 11/22/2012, SpO2 98.00%. No results found.  Recent Labs  02/03/13 2055  WBC 6.5  HGB 11.4*  HCT 33.0*  PLT 310    Recent Labs  02/03/13 2055  NA 135  K 3.6  CL 100  GLUCOSE 118*  BUN 11  CREATININE 0.68  CALCIUM 8.8   CBG (last 3)  No results found for this basename: GLUCAP,  in the last 72 hours  Wt Readings from Last 3 Encounters:  02/03/13 88.3 kg (194 lb 10.7 oz)  01/29/13 90.8 kg (200 lb 2.8 oz)  01/29/13 90.8 kg (200 lb 2.8 oz)    Physical Exam:  Constitutional: She is oriented to person, place, and time. NAD Obese  HENT:  Head: Normocephalic and atraumatic.  Right Ear: External ear normal.  Left Ear: External ear normal.  Eyes: EOM are normal.  Neck: Normal range of motion. Neck supple. No JVD present. No tracheal deviation present. Thyromegaly present.  Cardiovascular: Normal rate and regular rhythm.  Pulmonary/Chest: Effort normal and breath sounds normal. No respiratory distress.  Abdominal: Soft. Bowel sounds are normal. She exhibits no distension.  Lymphadenopathy:  She has no cervical adenopathy.  Neurological: [name] and oriented to person, place, and time. She displays normal reflexes.  UE near 5/5. RLE is 3-4/5 prox to 4/5 distal with ADF and APF. Mild to minimal sensory loss over right anterior foot and leg. LLE is 4/5 prox to 5/5 distally.  Skin:  Wound clean and intact, well-approximated, no drainage. Psychiatric: She has a normal mood and affect. Her behavior is normal. Judgment and thought content normal    Assessment/Plan: 1. Functional deficits secondary to L4-5  HNP with right L5 radiculopathy s/p decompression and fusion which require 3+ hours per day of interdisciplinary therapy in a comprehensive inpatient rehab setting. Physiatrist is providing close team supervision and 24 hour management of active medical problems listed below. Physiatrist and rehab team continue to assess barriers to discharge/monitor patient progress toward functional and medical goals. FIM: FIM - Bathing Bathing Steps Patient Completed: Chest;Right Arm;Left Arm;Abdomen;Front perineal area;Right upper leg;Left upper leg Bathing: 3: Mod-Patient completes 5-7 42f 10 parts or 50-74%  FIM - Upper Body Dressing/Undressing Upper body dressing/undressing steps patient completed:  (donned hospital gown) Upper body dressing/undressing: 0: Wears gown/pajamas-no public clothing FIM - Lower Body Dressing/Undressing Lower body dressing/undressing steps patient completed: Thread/unthread right pants leg;Thread/unthread left pants leg;Fasten/unfasten pants Lower body dressing/undressing: 3: Mod-Patient completed 50-74% of tasks  FIM - Toileting Toileting: 0: Activity did not occur  FIM - Archivist Transfers: 0-Activity did not occur  FIM - Banker Devices: Walker;HOB elevated;Bed rails Bed/Chair Transfer: 5: Supine > Sit: Supervision (verbal cues/safety issues);4: Bed > Chair or W/C: Min A (steadying Pt. > 75%)     Comprehension Comprehension Mode: Auditory Comprehension: 5-Understands complex 90% of the time/Cues < 10% of the time (secondary to language barrier)  Expression Expression Mode: Verbal Expression: 7-Expresses complex ideas: With no assist  Social Interaction Social Interaction: 7-Interacts appropriately with others - No medications needed.  Problem Solving Problem Solving: 5-Solves complex 90% of the time/cues < 10% of  the time  Memory Memory: 5-Recognizes or recalls 90% of the time/requires cueing < 10% of  the time  Medical Problem List and Plan:  1. Lumbar HNP with L5 radiculopathy  2. DVT Prophylaxis/Anticoagulation: SCDs. Check vascular studies  3. Pain Management: Neurontin 100 mg 3 times a day---increase to 200mg  for radicular pain  - Robaxin and Percocet as needed.   4. Mood/history of depression. No antidepressants prior to admission. Provide emotional support  5. Neuropsych: This patient is capable of making decisions on his/her own behalf.   LOS (Days) 1 A FACE TO FACE EVALUATION WAS PERFORMED  Evaleen Sant T 02/04/2013 9:09 AM

## 2013-02-04 NOTE — Evaluation (Signed)
Evaluation & treatment notes reviewed and accurately reflects assessment & plan and treatment sessions.

## 2013-02-04 NOTE — Evaluation (Addendum)
Physical Therapy Assessment and Plan  Patient Details  Name: [name] MRN: [id number] Date of Birth: [birth year]  PT Diagnosis: Abnormality of gait, Difficulty walking, Impaired sensation, Low back pain and Muscle weakness Rehab Potential: Good ELOS: 7-10 days    Today's Date: 02/04/2013 Time: 4098-1191 Time Calculation (min): 54 min  Problem List:  Patient Active Problem List   Diagnosis Date Noted  . HNP (herniated nucleus pulposus), lumbar 02/04/2013    Past Medical History:  Past Medical History  Diagnosis Date  . Neck pain   . Migraines   . Depression   . Anxiety     relative to circumstances to back problems/injury, not taking any medicine, pt. reports has been crying a lot  . Asthma   . GERD (gastroesophageal reflux disease)   . Arthritis     low back   Past Surgical History:  Past Surgical History  Procedure Laterality Date  . Lumbar laminectomy/decompression microdiscectomy  06/05/2012    Procedure: LUMBAR LAMINECTOMY/DECOMPRESSION MICRODISCECTOMY;  Surgeon: Emilee Hero, MD;  Location: [facility] OR;  Service: Orthopedics;  Laterality: Left;  Left sided lumbar 4-5 microdisectomy  . Childbirth      x4 vaginal     Assessment & Plan Clinical Impression: Patient is a 32 y.o. year old female with recent admission to the hospital with history of left-sided L4-5 microdiscectomy 06/05/2012. Admitted 01/28/2013 with recurrent back pain radiating to the lower extremities. 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 Underwent revision L4-5 decompression with removal of left sided disc fragment that was causing compression of the transversing L5 nerve/transforaminal lumbar interbody fusion 01/29/2013. Postoperative day 1 with increasing right lower extremity pain. CT scan reveals slightly medially deviated right L5 screw. Underwent removal of right L5 pedicle screw with reinsertion of screw to a more lateral position 01/30/2013 per Dr.  Yevette Edwards. Postoperative pain management. Patient was fitted with a TLSO orthotic brace. Physical and occupational therapy evaluation completed 01/30/2013 with recommendations of physical medicine rehabilitation consult to consider inpatient rehabilitation services.  Patient transferred to CIR on 02/03/2013 .   Patient currently requires min with mobility secondary to muscle weakness, decreased cardiorespiratoy endurance, decreased sensation and decreased standing balance, decreased balance strategies and difficulty maintaining precautions.  Prior to hospitalization, patient was modified independent  with mobility and lived with Spouse;Son;Daughter in a Apartment home.  Home access is small threshold to enter house.  Patient will benefit from skilled PT intervention to maximize safe functional mobility, minimize fall risk and decrease caregiver burden for planned discharge home with intermittent assist.  Anticipate patient will benefit from follow up [facility] at discharge.  PT - End of Session Endurance Deficit: Yes PT Assessment Rehab Potential: Good Barriers to Discharge: None Barriers to Discharge Comments: will need assist to bring RW up/down stairs at home to second level; higher level care of kids/household PT Plan PT Intensity: Minimum of 1-2 x/day ,45 to 90 minutes PT Frequency: 5 out of 7 days PT Duration Estimated Length of Stay: 7-10 days  PT Treatment/Interventions: Ambulation/gait training;Balance/vestibular training;Community reintegration;Discharge planning;DME/adaptive equipment instruction;Functional mobility training;Neuromuscular re-education;Pain management;Patient/family education;Psychosocial support;Skin care/wound management;Splinting/orthotics;Stair training;Therapeutic Activities;Therapeutic Exercise;UE/LE Coordination activities;UE/LE Strength taining/ROM;Wheelchair propulsion/positioning PT Recommendation Follow Up Recommendations: Home health PT Patient destination:  Home Equipment Recommended: None recommended by PT (pt owns RW )  Skilled Therapeutic Intervention Pt very nauseous and just thrown up before therapist entered room. After discussion and evaluation at bed level, pt starting to feel better and agreeable to attempt OOB. Assisted pt with donning  TLSO EOB and putting on clothing EOB with steady A transfer into w/c. Focused on w/c propulsion, gait training (pt with very wide BOS and keeps feet ER during gait and sit to stands), and stair negotiation using bilateral handrails (has L handrail at home to get to second floor of apt). Returned to bed end of session due to pt not wanting to get sick again. RN aware  PT Evaluation Precautions/Restrictions Precautions Precautions: Back;Fall Precaution Comments: no bending, lifting, twisting (pt able to verbalize 3/3 with min v.c's) Required Braces or Orthoses: Spinal Brace Spinal Brace: Thoracolumbosacral orthotic;Applied in sitting position Restrictions Weight Bearing Restrictions: No  Pain Pain Assessment Pain Assessment: 0-10 Pain Score:   8 Pain Type: Acute pain Pain Location: Other (Comment) (back and leg) Pain Orientation: Right Pain Descriptors / Indicators: Pressure Pain Intervention(s):  (Pt reported recently receiving pain meds) Multiple Pain Sites: Yes Home Living/Prior Functioning Home Living Lives With: Spouse;Son;Daughter Available Help at Discharge: Available PRN/intermittently Type of Home: Apartment Home Access: Stairs to enter [employer] of Steps: small threshold to enter house Entrance Stairs-Rails: None Home Layout: Two level Alternate Level Stairs-Number of Steps: 12 stairs to get to second level Alternate Level Stairs-Rails: Left Bathroom Shower/Tub: Tub/shower unit;Curtain Bathroom Toilet: Standard How Accessible: Accessible via walker Home Adaptive Equipment: Bedside commode/3-in-1;Walker - rolling Prior Function Level of Independence: Needs assistance  with ADLs;Needs assistance with homemaking;Used RW for mobility Able to Take Stairs?: Yes  Driving: No Vocation: Part time employment Leisure: Hobbies-yes (Comment) Comments: see above Vision/Perception  Vision - History Patient Visual Report: Blurring of vision;Unable to keep objects in focus Vision - Assessment Eye Alignment: Within Functional Limits Perception Perception: Within Functional Limits Praxis Praxis: Intact  Cognition Overall Cognitive Status: Within Functional Limits for tasks assessed Arousal/Alertness: Awake/alert Orientation Level: Oriented X4 Attention: Focused Focused Attention: Appears intact Memory: Appears intact Awareness: Appears intact Problem Solving: Appears intact Safety/Judgment: Appears intact Sensation Sensation Light Touch: Impaired Detail Light Touch Impaired Details: Impaired RLE (lateral aspect of foot and big toe; lateral shin) Coordination Gross Motor Movements are Fluid and Coordinated: Yes Fine Motor Movements are Fluid and Coordinated: Yes Motor  Motor Motor: Within Functional Limits  Locomotion  Ambulation Ambulation/Gait Assistance: 4: Min guard Gait Gait Pattern: Wide base of support;Antalgic  Trunk/Postural Assessment  Cervical Assessment Cervical Assessment: Within Functional Limits Thoracic Assessment Thoracic Assessment:  (back precautions and TLSO) Lumbar Assessment Lumbar Assessment:  (back precautions and TLSO)  Balance Balance Balance Assessed: Yes Static Sitting Balance Static Sitting - Level of Assistance: 6: Modified independent (Device/Increase time) Dynamic Sitting Balance Dynamic Sitting - Level of Assistance: 6: Modified independent (Device/Increase time) Static Standing Balance Static Standing - Level of Assistance: 5: Stand by assistance Dynamic Standing Balance Dynamic Standing - Level of Assistance: 4: Min assist Extremity Assessment  RUE Assessment RUE Assessment: Exceptions to [facility] (limited d/t  pulling sensation in back) RUE AROM (degrees) Overall AROM Right Upper Extremity: Unable to assess (unable to access fully d/t pulling sensation in back) LUE Assessment LUE Assessment: Exceptions to [facility] (limited d/t pulling sensation in back) LUE AROM (degrees) Overall AROM Left Upper Extremity: Unable to assess (unable to fully access d/t pulling sensation in back) RLE Assessment RLE Assessment: Exceptions to [facility] (grossly 3/5; limited by pain and "pulling" sensation) LLE Assessment LLE Assessment: Exceptions to [facility] (decreased muscular endurance; keeps LE in ER during gait)  FIM:  FIM - Bed/Chair Transfer Bed/Chair Transfer Assistive Devices: Bed rails;Walker Bed/Chair Transfer: 5: Supine > Sit: Supervision (verbal cues/safety  issues);5: Sit > Supine: Supervision (verbal cues/safety issues);4: Bed > Chair or W/C: Min A (steadying Pt. > 75%);4: Chair or W/C > Bed: Min A (steadying Pt. > 75%) FIM - Locomotion: Wheelchair Locomotion: Wheelchair: 5: Travels 150 ft or more: maneuvers on rugs and over door sills with supervision, cueing or coaxing FIM - Locomotion: Ambulation Locomotion: Ambulation Assistive Devices: Walker - Rolling;Orthosis Ambulation/Gait Assistance: 4: Min guard Locomotion: Ambulation: 2: Travels 50 - 149 ft with minimal assistance (Pt.>75%) FIM - Locomotion: Stairs Locomotion: Building control surveyor: Hand rail - 2 Locomotion: Stairs: 2: Up and Down 4 - 11 stairs with minimal assistance (Pt.>75%)   Refer to Care Plan for Long Term Goals  Recommendations for other services: None  Discharge Criteria: Patient will be discharged from PT if patient refuses treatment 3 consecutive times without medical reason, if treatment goals not met, if there is a change in medical status, if patient makes no progress towards goals or if patient is discharged from hospital.  The above assessment, treatment plan, treatment alternatives and goals were discussed and mutually agreed upon:  by patient  Tedd Sias 02/04/2013, 10:44 AM

## 2013-02-04 NOTE — Progress Notes (Signed)
Physical Therapy Session Note  Patient Details  Name: [name] MRN: [id number] Date of Birth: [birth year]  Today's Date: 02/04/2013 Time: 4098-1191 Time Calculation (min): 28 min  Short Term Goals: Week 1:  PT Short Term Goal 1 (Week 1): = LTGs  Skilled Therapeutic Interventions/Progress Updates:   Donned TLSO EOB with overall S. Gait into/out of bathroom with RW with S to complete toileting and hand hygiene standing at the sink. Focused rest of session on gait on unit > 150' with overall S for endurance and strengthening; improved gait quality noted with narrower BOS and increased hip/knee flexion. Returned to bed end of session with S to rest. Pt doffed brace seated EOB independently.   Therapy Documentation Precautions:  Precautions Precautions: Back;Fall Precaution Comments: Able to independently recall 3/3 back precautions Required Braces or Orthoses: Spinal Brace Spinal Brace: Thoracolumbosacral orthotic;Applied in sitting position Restrictions Weight Bearing Restrictions: No   Pain: Premedicated for pain.  See FIM for current functional status  Therapy/Group: Individual Therapy  Karolee Stamps [facility] 02/04/2013, 2:29 PM

## 2013-02-04 NOTE — Progress Notes (Signed)
Patient information reviewed and entered into eRehab system by Reana Chacko, RN, CRRN, PPS Coordinator.  Information including medical coding and functional independence measure will be reviewed and updated through discharge.    

## 2013-02-04 NOTE — Progress Notes (Signed)
VASCULAR LAB PRELIMINARY  PRELIMINARY  PRELIMINARY  PRELIMINARY  Bilateral lower extremity venous duplex  completed.    Preliminary report:  Bilateral:  No evidence of DVT, superficial thrombosis, or Baker's Cyst.    Aashir Umholtz, RVT 02/04/2013, 3:17 PM

## 2013-02-04 NOTE — Evaluation (Signed)
Occupational Therapy Assessment and Plan & Session Notes  Patient Details  Name: [name] MRN: [id number] Date of Birth: December 31, 1980  OT Diagnosis: abnormal posture, acute pain, muscle weakness (generalized), pain in joint and pain in thoracic spine Rehab Potential: Rehab Potential: Good ELOS: 7-10 days   Today's Date: 02/04/2013  Problem List:  Patient Active Problem List   Diagnosis Date Noted  . HNP (herniated nucleus pulposus), lumbar 02/04/2013    Past Medical History:  Past Medical History  Diagnosis Date  . Neck pain   . Migraines   . Depression   . Anxiety     relative to circumstances to back problems/injury, not taking any medicine, pt. reports has been crying a lot  . Asthma   . GERD (gastroesophageal reflux disease)   . Arthritis     low back   Past Surgical History:  Past Surgical History  Procedure Laterality Date  . Lumbar laminectomy/decompression microdiscectomy  06/05/2012    Procedure: LUMBAR LAMINECTOMY/DECOMPRESSION MICRODISCECTOMY;  Surgeon: Emilee Hero, MD;  Location: [facility] OR;  Service: Orthopedics;  Laterality: Left;  Left sided lumbar 4-5 microdisectomy  . Childbirth      x4 vaginal     Assessment & Plan [name] is a 32 y.o. right-handed female with history of left-sided L4-5 microdiscectomy 06/05/2012. Admitted 01/28/2013 with recurrent back pain radiating to the lower extremities. 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 Underwent revision L4-5 decompression with removal of left sided disc fragment that was causing compression of the transversing L5 nerve/transforaminal lumbar interbody fusion 01/29/2013. Postoperative day 1 with increasing right lower extremity pain. CT scan reveals slightly medially deviated right L5 screw. Underwent removal of right L5 pedicle screw with reinsertion of screw to a more lateral position 01/30/2013 per Dr. Yevette Edwards. Postoperative pain management. Patient was  fitted with a TLSO orthotic brace. Physical and occupational therapy evaluation completed 01/30/2013 with recommendations of physical medicine rehabilitation consult to consider inpatient rehabilitation services. Patient was felt to be a good candidate for inpatient rehabilitation services was admitted for comprehensive rehabilitation program  Patient transferred to CIR on 02/03/2013 .    Patient currently requires min-mod assist with basic self-care skills and IADL secondary to muscle weakness and muscle joint tightness, decreased cardiorespiratoy endurance and decreased sitting balance, decreased standing balance, decreased postural control, decreased balance strategies and difficulty maintaining precautions.  Prior to hospitalization, patient completed BADL's/IADL's with assistance [facility] nurse).  Patient will benefit from skilled intervention to increase independence with basic self-care skills and increase level of independence with iADL prior to discharge home with family .  Anticipate patient will require intermittent supervision and no further OT follow recommended.  OT - End of Session Activity Tolerance: Tolerates 30+ min activity with multiple rests Endurance Deficit: Yes OT Assessment Rehab Potential: Good Barriers to Discharge: Inaccessible home environment;Decreased caregiver support Barriers to Discharge Comments: 12 stairs to bedroom/bathroom; intermittent caregiver support (has 4 children, husband works 8 hrs/day) OT Plan OT Intensity: Minimum of 1-2 x/day, 45 to 90 minutes OT Frequency: 5 out of 7 days OT Duration/Estimated Length of Stay: 7-10 days OT Treatment/Interventions: Metallurgist training;Community reintegration;Discharge planning;Disease mangement/prevention;DME/adaptive equipment instruction;Functional mobility training;Neuromuscular re-education;Pain management;Patient/family education;Psychosocial support;Self Care/advanced ADL retraining;Skin care/wound  managment;Splinting/orthotics;Therapeutic Activities;Therapeutic Exercise;UE/LE Strength taining/ROM;UE/LE Coordination activities;Wheelchair propulsion/positioning OT Recommendation Recommendations for Other Services:  (none at this time) Patient destination: Home Follow Up Recommendations: None Equipment Recommended: Tub/shower bench  Precautions/Restrictions  Precautions Precautions: Back;Fall Required Braces or Orthoses: Spinal Brace Spinal Brace: Thoracolumbosacral orthotic;Applied in  sitting position Restrictions Weight Bearing Restrictions: No  General Vital Signs Therapy Vitals Temp: 98.2 F (36.8 C) Temp src: Oral Pulse Rate: 80 BP: 142/67 mmHg Patient Position, if appropriate: Lying Pain Pain Assessment Pain Assessment: 0-10 Pain Score:   9 Pain Type: Acute pain Pain Location: Back (and leg) Pain Orientation: Right Pain Descriptors / Indicators: Aching Pain Frequency: Constant Pain Onset: On-going Patients Stated Pain Goal: 3 Pain Intervention(s): Medication (See eMAR) Home Living/Prior Functioning Home Living Lives With: Spouse;Son;Daughter Available Help at Discharge: Available PRN/intermittently Type of Home: Apartment Home Access: Stairs to enter [employer] of Steps: small threshold to enter house Entrance Stairs-Rails: None Home Layout: Two level Alternate Level Stairs-Number of Steps: 12 stairs to get to second level Alternate Level Stairs-Rails: Left Bathroom Shower/Tub: Tub/shower unit;Curtain Bathroom Toilet: Standard How Accessible: Accessible via walker Home Adaptive Equipment: Bedside commode/3-in-1;Walker - rolling IADL History Homemaking Responsibilities: Yes Meal Prep Responsibility: Primary Laundry Responsibility: Primary Cleaning Responsibility: Primary Bill Paying/Finance Responsibility: Primary Shopping Responsibility: Primary Child Care Responsibility: Primary Current License: No Mode of Transportation:  Bus;Walk;Family Occupation: Part time employment (@ [employer]) Type of Occupation:  (sweeps, mops, take out trash, cleans tables) Leisure and Hobbies: gardening, take care of birds Prior Function Level of Independence: Needs assistance with ADLs;Needs assistance with homemaking;Needs assistance with gait (R/W for mobility) Bath: Maximal Toileting: Moderate Dressing: Maximal Meal Prep: Maximal Light Housekeeping: Maximal Able to Take Stairs?: Yes (with assist) Driving: No Vocation: Part time employment Leisure: Hobbies-yes (Comment) Comments: see above ADL:  See FIM  Vision/Perception:  See Eval Navigator  Cognition: See Conservator, museum/gallery Light Touch: Impaired Detail Light Touch Impaired Details: Impaired RLE (lateral aspect of foot and big toe; lateral shin) Motor:  See Eval Navigator  Mobility: See Eval Navigator  Trunk/Postural Assessment  Cervical Assessment Cervical Assessment: Within Functional Limits Thoracic Assessment Thoracic Assessment:  (back precautions and TLSO) Lumbar Assessment Lumbar Assessment:  (back precautions and TLSO)  Balance Balance Balance Assessed: Yes Static Sitting Balance Static Sitting - Level of Assistance: 6: Modified independent (Device/Increase time) Dynamic Sitting Balance Dynamic Sitting - Level of Assistance: 6: Modified independent (Device/Increase time) Static Standing Balance Static Standing - Level of Assistance: 5: Stand by assistance Dynamic Standing Balance Dynamic Standing - Level of Assistance: 4: Min assist Extremity/Trunk Assessment:  See Eval Navigator  FIM:  FIM - Eating Eating Activity: 0: Activity did not occur FIM - Grooming Grooming Steps: Oral care, brush teeth, clean dentures Grooming: 0: Activity did not occur FIM - Bathing Bathing Steps Patient Completed: Chest;Right Arm;Left Arm;Abdomen;Front perineal area;Right upper leg;Left upper leg Bathing: 0: Activity did not occur FIM -  Upper Body Dressing/Undressing Upper body dressing/undressing steps patient completed:  (donned hospital gown) Upper body dressing/undressing: 0: Activity did not occur FIM - Lower Body Dressing/Undressing Lower body dressing/undressing steps patient completed: Thread/unthread right pants leg;Thread/unthread left pants leg;Fasten/unfasten pants Lower body dressing/undressing: 0: Activity did not occur FIM - Toileting Toileting: 0: Activity did not occur FIM - Banker Devices: Bed rails;HOB elevated;Walker Bed/Chair Transfer: 5: Supine > Sit: Supervision (verbal cues/safety issues) FIM - Diplomatic Services operational officer Devices: Elevated toilet seat;Grab bars;Walker Toilet Transfers: 4-To toilet/BSC: Min A (steadying Pt. > 75%) (steady assist) FIM - Tub/Shower Transfers Tub/Shower Assistive Devices: Tub transfer bench;Grab bars;Walker Tub/shower Transfers: 4-Into Tub/Shower: Min A (steadying Pt. > 75%/lift 1 leg) (steady assist)   Refer to Care Plan for Long Term Goals  Recommendations for other services: None at this  time.  Discharge Criteria: Patient will be discharged from OT if patient refuses treatment 3 consecutive times without medical reason, if treatment goals not met, if there is a change in medical status, if patient makes no progress towards goals or if patient is discharged from hospital.  The above assessment, treatment plan, treatment alternatives and goals were discussed and mutually agreed upon: by patient  Session Note #1: Time: 0730-0830 (60 mins) Pt reported 7/10 pain in R back and R leg. Pt reported she recently received pain meds.  Initial one-on-one eval completed. Pt supine->EOB. Pt reported feeling dizzy while sitting and was instructed to eat d/t recent pain meds. Pt able to recall 3/3 back precautions with min v.c.'s.  Pt completed UB/LB bathing @ EOB. TLSO brace applied in sitting and pt able to assist with  this.  Pt reported she did not have extra clothes at this time and donned shorts and hospital gown. From here, pt ambulated ~5 ft to W/C using R/W and completed grooming in W/C @ sink. At end of tx session, pt in W/C with call bell and phone within reach.  Session Note #2: Time: 1100-1145 (45 mins) Pt reported 9/10 pain in BLE's. Nrsg notified & administered pain meds.  Upon entering room, pt supine in bed with social worker present. Pt supine->EOB and donned TLSO. Pt ambulated to BR using R/W and transferred to raised toilet seat w/ assist of grab bars. Next, pt ambulated to storage room to retrieve walker bag (~20 ft). OT educated pt on adhering to precautions while obtaining objects from cabinets and modifications that could be made in the home. From here pt ambulated to ADL apt (~200 ft) using R/W and performed tub transfer using tub transfer bench (steady assist). Next, pt sit<>stand @ couch level. Skilled intervention focused on activity tolerance/endurnace, fxal transfers, and general safety within the home. Pt ambulated ~50 ft and then pushed in the W/C back to her room. While ambulating with R/W pt complained of L elbow pain. At end of tx session, pt in W/C with call bell and phone within reach.  [name] 02/04/2013, 12:00 PM

## 2013-02-05 ENCOUNTER — Inpatient Hospital Stay (HOSPITAL_COMMUNITY): Payer: Worker's Compensation

## 2013-02-05 ENCOUNTER — Inpatient Hospital Stay (HOSPITAL_COMMUNITY): Payer: Worker's Compensation | Admitting: Occupational Therapy

## 2013-02-05 NOTE — Progress Notes (Signed)
Subjective/Complaints: Pain better. Still some radiation to right hip.slept well. A 12 point review of systems has been performed and if not noted above is otherwise negative.   Objective: Vital Signs: Blood pressure 104/70, pulse 84, temperature 98.7 F (37.1 C), temperature source Oral, resp. rate 18, height 5\' 4"  (1.626 m), weight 88.3 kg (194 lb 10.7 oz), last menstrual period 11/22/2012, SpO2 98.00%. No results found.  Recent Labs  02/03/13 2055  WBC 6.5  HGB 11.4*  HCT 33.0*  PLT 310    Recent Labs  02/03/13 2055  NA 135  K 3.6  CL 100  GLUCOSE 118*  BUN 11  CREATININE 0.68  CALCIUM 8.8   CBG (last 3)  No results found for this basename: GLUCAP,  in the last 72 hours  Wt Readings from Last 3 Encounters:  02/03/13 88.3 kg (194 lb 10.7 oz)  01/29/13 90.8 kg (200 lb 2.8 oz)  01/29/13 90.8 kg (200 lb 2.8 oz)    Physical Exam:  Constitutional: She is oriented to person, place, and time. NAD Obese  HENT:  Head: Normocephalic and atraumatic.  Right Ear: External ear normal.  Left Ear: External ear normal.  Eyes: EOM are normal.  Neck: Normal range of motion. Neck supple. No JVD present. No tracheal deviation present. Thyromegaly present.  Cardiovascular: Normal rate and regular rhythm.  Pulmonary/Chest: Effort normal and breath sounds normal. No respiratory distress.  Abdominal: Soft. Bowel sounds are normal. She exhibits no distension.  Lymphadenopathy:  She has no cervical adenopathy.  Neurological: [name] and oriented to person, place, and time. She displays normal reflexes.  UE near 5/5. RLE is 4-/5 prox to 4+/5 distal with ADF and APF. Mild to minimal sensory loss over right anterior foot and leg. LLE is 4/5 prox to 5/5 distally.  Skin:  Wound clean and intact, well-approximated, no drainage. Psychiatric: She has a normal mood and affect. Her behavior is normal. Judgment and thought content normal    Assessment/Plan: 1. 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 Physiatrist is providing close team supervision and 24 hour management of active medical problems listed below. Physiatrist and rehab team continue to assess barriers to discharge/monitor patient progress toward functional and medical goals. FIM: FIM - Bathing Bathing Steps Patient Completed: Chest;Right Arm;Left Arm;Abdomen;Front perineal area;Right upper leg;Left upper leg Bathing: 0: Activity did not occur  FIM - Upper Body Dressing/Undressing Upper body dressing/undressing steps patient completed:  (donned hospital gown) Upper body dressing/undressing: 0: Activity did not occur FIM - Lower Body Dressing/Undressing Lower body dressing/undressing steps patient completed: Thread/unthread right pants leg;Thread/unthread left pants leg;Fasten/unfasten pants Lower body dressing/undressing: 0: Activity did not occur  FIM - Toileting Toileting: 0: Activity did not occur  FIM - Diplomatic Services operational officer Devices: Elevated toilet seat;Grab bars;Walker Toilet Transfers: 4-To toilet/BSC: Min A (steadying Pt. > 75%) (steady assist)  FIM - Press photographer Assistive Devices: Bed rails;HOB elevated;Walker Bed/Chair Transfer: 5: Supine > Sit: Supervision (verbal cues/safety issues)  FIM - Locomotion: Wheelchair Locomotion: Wheelchair: 5: Travels 150 ft or more: maneuvers on rugs and over door sills with supervision, cueing or coaxing FIM - Locomotion: Ambulation Locomotion: Ambulation Assistive Devices: Walker - Rolling;Orthosis Ambulation/Gait Assistance: 4: Min guard Locomotion: Ambulation: 2: Travels 50 - 149 ft with minimal assistance (Pt.>75%)  Comprehension Comprehension Mode: Auditory Comprehension: 6-Follows complex conversation/direction: With extra  time/assistive  device  Expression Expression Mode: Verbal Expression: 7-Expresses complex ideas: With no assist  Social Interaction Social Interaction: 7-Interacts appropriately with others - No medications needed.  Problem Solving Problem Solving: 6-Solves complex problems: With extra time  Memory Memory: 6-More than reasonable amt of time  Medical Problem List and Plan:  1. Lumbar HNP with L5 radiculopathy  2. DVT Prophylaxis/Anticoagulation: SCDs., ambulation, dopplers negative 3. Pain Management: Neurontin 100 mg 3 times a day---increase to 200mg  for radicular pain  - Robaxin and Percocet as needed.   4. Mood/history of depression. No antidepressants prior to admission. Provide emotional support  5. Neuropsych: This patient is capable of making decisions on his/her own behalf.   LOS (Days) 2 A FACE TO FACE EVALUATION WAS PERFORMED  SWARTZ,ZACHARY T 02/05/2013 8:40 AM

## 2013-02-05 NOTE — Progress Notes (Signed)
Physical Therapy Session Note  Patient Details  Name: [name] MRN: [id number] Date of Birth: 10-Oct-1980  Today's Date: 02/05/2013  Short Term Goals: Week 1:  PT Short Term Goal 1 (Week 1): = LTGs  Session #1 Time: [phone] Time Calculation (min): 59 min Reports pain is ok for now and had pain medication early this morning. Finished breakfast EOB and then pt donned brace EOB before transferring OOB with RW with S. Gait down to therapy gym with emphasis on normalizing gait pattern and upright posture with S. Curb step negotiation with RW to simulate threshold at home entry and for community mobility with min A. Stair training for preparation for ascending/descending stairs at home to second floor with 1 rail in stairwell going up/down full flight of steps with steady A. Discussed having someone carry RW up/down stairs for her at this time for safety and pt states this wouldn't be an issue. Simulated car transfer with S approaching mod I using RW; good carryover of back precautions. Gait on unit > 250' with S; cues for posture and pt states her gait pattern is similar to how she walked before but encouraged her to narrow her BOS for more normal gait patter ease of use of AD as well. By end of session when returned to bed, pt c/o of nausea; ginger ale given and RN notified.  Session #2: 1115-1200 (45 min) Pt reports pain increased at end of session with fatigue but manageable. Session focused on community gait in outdoor environment over various surfaces, navigating elevator and doorways and husband&daughter present. Education provided on safety with community mobility as well as energy conservation techniques to which pt verbalized understanding. By the end of session RLE began feeling weaker like it was going to buckle at times due to fatigue but pt able to self correct. Completed all gait and transfers with overall S; cues for safety and technique at times.   Therapy  Documentation Precautions:  Precautions Precautions: Back;Fall Precaution Comments: Able to independently recall 3/3 back precautions Required Braces or Orthoses: Spinal Brace Spinal Brace: Thoracolumbosacral orthotic;Applied in sitting position Restrictions Weight Bearing Restrictions: No  See FIM for current functional status  Therapy/Group: Individual Therapy  Karolee Stamps [facility] 02/05/2013, 8:56 AM

## 2013-02-05 NOTE — Progress Notes (Signed)
Notes reviewed and accurately reflects treatment sessions.   

## 2013-02-05 NOTE — Progress Notes (Signed)
Occupational Therapy Session Notes  Patient Details  Name: [name] MRN: [id number] Date of Birth: December 01, 1980  Today's Date: 02/05/2013  Short Term Goals: Week 1:  OT Short Term Goal 1 (Week 1): STG's=LTG's d/t ELOS  Skilled Therapeutic Interventions/Progress Updates:  Session Note #1: Time: 1015-1115 (60 mins) Pt reported 7/10 back in lower back region. Nrsg notified & administered pain meds.  Upon entering room, pt supine in bed. Pt supine->EOB. TLSO donned in sitting position. Pt ambulated to BR using R/W and completed toilet transfer/toileting using elevated toilet seat & grab bars. Pt transferred->shower chair using R/W, doffed TLSO, and completed UB/LB bathing in sitting. OT introduced long-handled sponge for bathing. Pt completed UB dressing & donned TLSO with assist. Pt out of BR->W/C and completed LB dressing using reacher & sock aid. Sit->stand for pulling up pants. Pt completed grooming standing @ sink. Next, pt completed light cleaning including making the bed while adhering to back precautions. Pt able to verbalize 3/3 back precautions. Pt instructed on proper sit->stand technique focusing on leaning forward to decrease stress placed on back. At end of tx session, pt seated in W/C with family present in room.  Session Note #2: Time: 1400-1430 (30 mins) Pt reported 7/10 pain in lower back region and nausea. Pt stated she recently received pain meds. Nrsg administered med for nausea.  Upon entering room, pt supine in bed with 32 yo daughter. Pt supine->EOB. Pt pushed daughter in W/C to rehab gym (~150 ft). Skilled intervention focused on dynamic standing balance/endurance, BUE/BLE coordination, sit->stand transfers, and overall fxal endurance/activity tolerance. Pt pushed W/C back to room (~150 ft) and transferred->W/C using R/W. At end of tx session, pt seated in W/C with call bell and phone within reach.  Therapy Documentation Precautions:  Precautions Precautions:  Back;Fall Precaution Comments: Able to independently recall 3/3 back precautions Required Braces or Orthoses: Spinal Brace Spinal Brace: Thoracolumbosacral orthotic;Applied in sitting position Restrictions Weight Bearing Restrictions: No  See FIM for current functional status  Therapy/Group: Individual Therapy  Laela Deviney 02/05/2013, 7:12 AM

## 2013-02-06 ENCOUNTER — Inpatient Hospital Stay (HOSPITAL_COMMUNITY): Payer: Self-pay | Admitting: Physical Therapy

## 2013-02-06 ENCOUNTER — Inpatient Hospital Stay (HOSPITAL_COMMUNITY): Payer: Worker's Compensation | Admitting: Physical Therapy

## 2013-02-06 ENCOUNTER — Inpatient Hospital Stay (HOSPITAL_COMMUNITY): Payer: Worker's Compensation | Admitting: Occupational Therapy

## 2013-02-06 ENCOUNTER — Inpatient Hospital Stay (HOSPITAL_COMMUNITY): Payer: Worker's Compensation

## 2013-02-06 DIAGNOSIS — IMO0002 Reserved for concepts with insufficient information to code with codable children: Secondary | ICD-10-CM

## 2013-02-06 DIAGNOSIS — M5126 Other intervertebral disc displacement, lumbar region: Secondary | ICD-10-CM

## 2013-02-06 NOTE — Progress Notes (Signed)
Subjective/Complaints: Had a good day, constipated. No bm in 4 days. Pain improving. Brace fitting adequately A 12 point review of systems has been performed and if not noted above is otherwise negative.   Objective: Vital Signs: Blood pressure 111/69, pulse 80, temperature 98.1 F (36.7 C), temperature source Oral, resp. rate 18, height 5\' 4"  (1.626 m), weight 87.9 kg (193 lb 12.6 oz), last menstrual period 11/22/2012, SpO2 96.00%. No results found.  Recent Labs  02/03/13 2055  WBC 6.5  HGB 11.4*  HCT 33.0*  PLT 310    Recent Labs  02/03/13 2055  NA 135  K 3.6  CL 100  GLUCOSE 118*  BUN 11  CREATININE 0.68  CALCIUM 8.8   CBG (last 3)  No results found for this basename: GLUCAP,  in the last 72 hours  Wt Readings from Last 3 Encounters:  02/06/13 87.9 kg (193 lb 12.6 oz)  01/29/13 90.8 kg (200 lb 2.8 oz)  01/29/13 90.8 kg (200 lb 2.8 oz)    Physical Exam:  Constitutional: She is oriented to person, place, and time. NAD Obese  HENT:  Head: Normocephalic and atraumatic.  Right Ear: External ear normal.  Left Ear: External ear normal.  Eyes: EOM are normal.  Neck: Normal range of motion. Neck supple. No JVD present. No tracheal deviation present. Thyromegaly present.  Cardiovascular: Normal rate and regular rhythm.  Pulmonary/Chest: Effort normal and breath sounds normal. No respiratory distress.  Abdominal: Soft. Bowel sounds are normal. She exhibits no distension.  Lymphadenopathy:  She has no cervical adenopathy.  Neurological: [name] and oriented to person, place, and time. She displays normal reflexes.  UE near 5/5. RLE is 4-/5 prox to 4+/5 distal with ADF and APF. Mild to minimal sensory loss over right anterior foot and leg. LLE is 4/5 prox to 5/5 distally.  Skin:  Wound clean and intact, well-approximated, no drainage. Psychiatric: She has a normal mood and affect. Her behavior is normal. Judgment and thought content normal     Assessment/Plan: 1. 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 Physiatrist is providing close team supervision and 24 hour management of active medical problems listed below. Physiatrist and rehab team continue to assess barriers to discharge/monitor patient progress toward functional and medical goals. FIM: FIM - Bathing Bathing Steps Patient Completed: Chest;Left Arm;Right Arm;Abdomen;Front perineal area;Right upper leg;Left upper leg;Right lower leg (including foot);Left lower leg (including foot) Bathing: 4: Min-Patient completes 8-9 41f 10 parts or 75+ percent  FIM - Upper Body Dressing/Undressing Upper body dressing/undressing steps patient completed: Thread/unthread right sleeve of pullover shirt/dresss;Thread/unthread left sleeve of pullover shirt/dress;Put head through opening of pull over shirt/dress;Pull shirt over trunk Upper body dressing/undressing: 5: Set-up assist to: Apply TLSO, cervical collar (set-up to obtain clothing as well) FIM - Lower Body Dressing/Undressing Lower body dressing/undressing steps patient completed: Thread/unthread right pants leg;Thread/unthread left pants leg;Pull pants up/down;Don/Doff right sock;Don/Doff left sock Lower body dressing/undressing: 5: Set-up assist to: Obtain clothing (with use of AE)  FIM - Toileting Toileting steps completed by patient: Adjust clothing prior to toileting;Performs perineal hygiene;Adjust clothing after toileting Toileting Assistive Devices: Grab bar or rail for support Toileting: 5: Supervision: Safety issues/verbal cues  FIM - Diplomatic Services operational officer Devices: Elevated toilet seat;Grab bars Toilet Transfers: 5-To toilet/BSC: Supervision (verbal cues/safety issues);5-From toilet/BSC: Supervision (verbal cues/safety issues)  FIM -  Bed/Chair Transfer  Bed/Chair Transfer Assistive Devices: Bed rails;HOB elevated;Walker Bed/Chair Transfer: 5: Supine > Sit: Supervision (verbal cues/safety issues)  FIM - Locomotion: Wheelchair Locomotion: Wheelchair: 0: Activity did not occur FIM - Locomotion: Ambulation Locomotion: Ambulation Assistive Devices: Walker - Rolling;Orthosis Ambulation/Gait Assistance: 5: Supervision Locomotion: Ambulation: 5: Travels 150 ft or more with supervision/safety issues  Comprehension Comprehension Mode: Auditory Comprehension: 5-Understands complex 90% of the time/Cues < 10% of the time  Expression Expression Mode: Verbal Expression: 7-Expresses complex ideas: With no assist  Social Interaction Social Interaction: 7-Interacts appropriately with others - No medications needed.  Problem Solving Problem Solving: 5-Solves complex 90% of the time/cues < 10% of the time  Memory Memory: 5-Recognizes or recalls 90% of the time/requires cueing < 10% of the time  Medical Problem List and Plan:  1. Lumbar HNP with L5 radiculopathy  2. DVT Prophylaxis/Anticoagulation: SCDs., ambulation, dopplers negative 3. Pain Management: Neurontin 100 mg 3 times a day---increase to 200mg  for radicular pain  - Robaxin and Percocet as needed.   4. Mood/history of depression. No antidepressants prior to admission. Provide emotional support  5. Neuropsych: This patient is capable of making decisions on his/her own behalf.  6. Constipation: augment bowel regimen  LOS (Days) 3 A FACE TO FACE EVALUATION WAS PERFORMED  [place] Ferrick T 02/06/2013 8:47 AM

## 2013-02-06 NOTE — Progress Notes (Signed)
Physical Therapy Session Note  Patient Details  Name: [name] MRN: [id number] Date of Birth: Feb 27, 1981  Today's Date: 02/06/2013 Time: 7829-5621 Time Calculation (min): 66 min  Short Term Goals: Week 1:  PT Short Term Goal 1 (Week 1): = LTGs  Skilled Therapeutic Interventions/Progress Updates:   Patient asleep in bed; performed rolling in bed and supine > sit with bed rail and verbal cues to adhere to back precautions and avoid twisting.  Seated EOB patient able to verbalize and demonstrate how to independently done TLSO but still requires assistance and cues avoid twisting to buckle clasps on L side.  Performed sit > stand and transfer to toilet with RW supervision; performed all toileting tasks and hand washing at the sink with supervision.  Performed ambulation in controlled environment x 150' x 2 reps with RW and supervision with wide BOS, hip ER and R genu recurvatum noted.  In gym, discussed stair negotiation sequencing for home.  Patient reports she has a full flight indoors to access bathroom upstairs.  When asked who will assist her with RW she states that a friend may come 3x/week to assist or her 32 y.o. Daughter can assist with RW management up/down stairs but she reports she has to go up/down flight multiple times a day; discussed that her daughter may not be able to manage heavy RW and stairs safely.  Demonstrated to pt how to perform stair negotiation with one UE support on rail and how to hold folded RW on stairs during ascension and descending; had patient return demonstrate on shorter wooden steps with supervision-min A and verbal cues for sequence to fold and unfold RW; progressed to performing full flight of stairs (7.5 inches tall each) with L rail and RUE managing folded RW with step to sequence ascending with LLE and descending with RLE with min A.  Back in gym reviewed how to contract TA and pelvic floor muscles in sitting to stabilize back; performed sustained activation of  TA and pelvic floor during sit <> stand from elevated mat without UE.  Also performed standing balance and LE strengthening exercises with bilat UE support and focus on sustained TA and pelvic floor activation during 12 reps each: R and L LE hamstring curls, RLE hip flexion, RLE hip extension.  Returned to room and to bathroom with RW supervision.  Returned to bed and doffed brace EOB and performed sit > supine supervision.     Therapy Documentation Precautions:  Precautions Precautions: Back;Fall Precaution Booklet Issued: Yes (comment) Precaution Comments: Able to independently recall 3/3 back precautions Required Braces or Orthoses: Spinal Brace Spinal Brace: Thoracolumbosacral orthotic;Applied in sitting position Restrictions Weight Bearing Restrictions: No Vital Signs: Therapy Vitals Temp: 98.5 F (36.9 C) Temp src: Oral Pulse Rate: 83 BP: 108/59 mmHg Patient Position, if appropriate: Lying Oxygen Therapy SpO2: 99 % O2 Device: None (Room air) Pain: Pain Assessment Pain Assessment: No/denies pain Pain Score:   8 Pain Type: Surgical pain Pain Location: Back Pain Descriptors / Indicators: Aching Pain Frequency: Constant Pain Onset: On-going Patients Stated Pain Goal: 3 Pain Intervention(s): Medication (See eMAR);Repositioned;Emotional support Multiple Pain Sites: No Locomotion : Ambulation Ambulation/Gait Assistance: 5: Supervision   See FIM for current functional status  Therapy/Group: Individual Therapy  Edman Circle [facility] 02/06/2013, 4:17 PM

## 2013-02-06 NOTE — Progress Notes (Signed)
Physical Therapy Session Note  Patient Details  Name: [name] MRN: [id number] Date of Birth: May 08, 1981  Today's Date: 02/06/2013 Time: 0730-0825 Time Calculation (min): 55 min  Short Term Goals: Week 1:  PT Short Term Goal 1 (Week 1): = LTGs  Skilled Therapeutic Interventions/Progress Updates:    Transverse abdominus (TA) + pelvic floor contractions 10 sec holds progressed to 30 sec holds. Progressed to alternating leg lifts all in supine while maintaining TA and pelvic floor contraction. Cues for stabilization of pelvis during leg lifts. Cues to incorporate pelvic stabilization during mobility. Pt able to don brace with min assist sitting EOB.   Ambulation 2 x 170' with RW and close supervision. Obstacle course negotiating cones and up/down curb step x 2 reps with RW and close supervision. Cues throughout for maintaining back precautions particularly when in bed without brace (most difficulty remembering not to twist).  Therapy Documentation Precautions:  Precautions Precautions: Back;Fall Precaution Comments: Able to independently recall 3/3 back precautions Required Braces or Orthoses: Spinal Brace Spinal Brace: Thoracolumbosacral orthotic;Applied in sitting position Restrictions Weight Bearing Restrictions: No Pain: Pain Assessment Pain Assessment: No/denies pain Pain Score: 0-No pain  See FIM for current functional status  Therapy/Group: Individual Therapy  Wilhemina Bonito 02/06/2013, 12:27 PM

## 2013-02-06 NOTE — Progress Notes (Signed)
Occupational Therapy Session Note  Patient Details  Name: [name] MRN: [id number] Date of Birth: [birth year]  Today's Date: 02/06/2013 Time: 1000-1045 Time Calculation (min): 45 min  Short Term Goals: Week 1:  OT Short Term Goal 1 (Week 1): STG's=LTG's d/t ELOS  Skilled Therapeutic Interventions/Progress Updates:    Pt seated in w/c upon arrival.  Pt stated she took a shower during earlier OT session and only needed to get dressed.  Pt rolled to drawers to gather clothing and transferred to EOB to doff/don TLSO and get dressed.  Pt used reacher and sock aid to assist with dressing tasks.  Pt completed all dressing tasks with supervision, inclduing TLSO, and amb with RW to sink to complete grooming tasks while standing.  Pt transitioned to ADL apartment to engaged in functional amb with RW for home mgmt tasks.  Pt educated on use of reacher to gather items from floor and lower surfaces and issued a reacher bag for RW.  Pt stated that was a great idea.  Pt amb from ADL to her room with rest stop at elevators. Therapy Documentation Precautions:  Precautions Precautions: Back;Fall Precaution Comments: Able to independently recall 3/3 back precautions Required Braces or Orthoses: Spinal Brace Spinal Brace: Thoracolumbosacral orthotic;Applied in sitting position Restrictions Weight Bearing Restrictions: No  Pain: Pain Assessment Pain Assessment: No/denies pain Pain Score: 0-No pain  See FIM for current functional status  Therapy/Group: Individual Therapy  Rich Brave 02/06/2013, 10:51 AM

## 2013-02-06 NOTE — Progress Notes (Signed)
Occupational Therapy Session Note  Patient Details  Name: Mammie Meras MRN: [id number] Date of Birth: 11-Oct-1980  Today's Date: 02/06/2013 Time: 0900-0930 Time Calculation (min): 30 min  Short Term Goals: Week 1:  OT Short Term Goal 1 (Week 1): STG's=LTG's d/t ELOS  Skilled Therapeutic Interventions/Progress Updates:  Self care retraining to include shower transfer and bath. Focus session on functional mobility and performing BADL while adhering to back precautions and use of LH sponge to improve independence. Patient able to doff brace with supervision and donn brace with min assist and instruction when overlapping at the sides to place the front abdominal portion of brace on top of the back portion and to raise the thigh portion to better align the back portion of the brace.  Patient donned hosp gown to prepare for dressing session scheduled for later.  All items within reach.  Therapy Documentation Precautions:  Precautions Precautions: Back;Fall Precaution Comments: Able to independently recall 3/3 back precautions Required Braces or Orthoses: Spinal Brace Spinal Brace: Thoracolumbosacral orthotic;Applied in sitting position Restrictions Weight Bearing Restrictions: No Pain: 3/10 premedicated, repositioned ADL: See FIM for current functional status  Therapy/Group: Individual Therapy  Chace Bisch 02/06/2013, 12:22 PM

## 2013-02-07 ENCOUNTER — Inpatient Hospital Stay (HOSPITAL_COMMUNITY): Payer: Worker's Compensation | Admitting: Physical Therapy

## 2013-02-07 ENCOUNTER — Inpatient Hospital Stay (HOSPITAL_COMMUNITY): Payer: Worker's Compensation

## 2013-02-07 DIAGNOSIS — K5900 Constipation, unspecified: Secondary | ICD-10-CM

## 2013-02-07 DIAGNOSIS — F329 Major depressive disorder, single episode, unspecified: Secondary | ICD-10-CM

## 2013-02-07 DIAGNOSIS — M5126 Other intervertebral disc displacement, lumbar region: Secondary | ICD-10-CM

## 2013-02-07 NOTE — Progress Notes (Signed)
Physical Therapy Session Note  Patient Details  Name: [name] MRN: [id number] Date of Birth: 1980-11-04  Today's Date: 02/07/2013 Time: 6578-4696 Time Calculation (min): 60 min  Short Term Goals: Week 1:  PT Short Term Goal 1 (Week 1): = LTGs  Therapy Documentation Precautions:  Precautions Precautions: Back;Fall Precaution Booklet Issued: Yes (comment) Precaution Comments: Able to independently recall 3/3 back precautions Required Braces or Orthoses: Spinal Brace Spinal Brace: Thoracolumbosacral orthotic;Applied in sitting position Restrictions Weight Bearing Restrictions: No Pain: Pain Assessment Pain Score:   2 Faces Pain Scale: Hurts a little bit   Gait Training:(60') using RW multiple walking distances of 200', high stepping x 20' followed by regular gait, sidestepping at rail in hallway.  See FIM for current functional status  Therapy/Group: Group Therapy  Ardel Jagger J 02/07/2013, 10:41 AM

## 2013-02-07 NOTE — Progress Notes (Signed)
Physical Therapy Session Note  Patient Details  Name: [name] MRN: [id number] Date of Birth: 03/12/1981  Today's Date: 02/07/2013 Time: 3086-5784 Time Calculation (min): 30 min  Short Term Goals: Week 1:  PT Short Term Goal 1 (Week 1): = LTGs  Therapy Documentation Precautions:  Precautions Precautions: Back;Fall Precaution Booklet Issued: Yes (comment) Precaution Comments: Able to independently recall 3/3 back precautions Required Braces or Orthoses: Spinal Brace Spinal Brace: Thoracolumbosacral orthotic;Applied in sitting position Restrictions Weight Bearing Restrictions: No Pain: Pain Assessment Pain Assessment: 0-10 Pain Score:   8 Pain Type: Surgical pain Pain Location: Back Pain Descriptors / Indicators: Aching Pain Frequency: Intermittent Pain Onset: On-going Pain Intervention(s): Medication (See eMAR)  Gait Training:(15') using RW outdoors on brick sidewalk/patio 2 x 150' S/Mod-I with R LE > L knees going into recurvatum with moderate control W/c management:(15') propelling x 200' on tile floors Mod-I, on/off with leg rests with Max-Assist, brakes Independent.   Therapy/Group: Individual Therapy  Rex Kras 02/07/2013, 2:54 PM

## 2013-02-07 NOTE — Progress Notes (Signed)
[name] is a 32 y.o. female Jul 04, 1981 [id number]  Subjective: No new complaints. No new problems.  Feeling OK.  Objective: Vital signs in last 24 hours: Temp:  [98 F (36.7 C)-98.5 F (36.9 C)] 98.4 F (36.9 C) (05/31 0543) Pulse Rate:  [82-87] 82 (05/31 0543) Resp:  [18] 18 (05/31 0543) BP: (101-108)/(59-75) 101/70 mmHg (05/31 0543) SpO2:  [97 %-99 %] 99 % (05/31 0543) Weight change:  Last BM Date: 02/06/13  Intake/Output from previous day: 05/30 0701 - 05/31 0700 In: 600 [P.O.:600] Out: 2 [Urine:2] Last cbgs: CBG (last 3)  No results found for this basename: GLUCAP,  in the last 72 hours   Physical Exam General: No apparent distress    HEENT: moist mucosa Lungs: Normal effort. Lungs clear to auscultation, no crackles or wheezes. Cardiovascular: Regular rate and rhythm, no edema Musculoskeletal:  No change from before Neurological: No new neurological deficits Wounds: N/A    Skin: clear Alert, cooperative   Lab Results: BMET    Component Value Date/Time   NA 135 02/03/2013 2055   K 3.6 02/03/2013 2055   CL 100 02/03/2013 2055   CO2 24 02/03/2013 2055   GLUCOSE 118* 02/03/2013 2055   BUN 11 02/03/2013 2055   CREATININE 0.68 02/03/2013 2055   CALCIUM 8.8 02/03/2013 2055   GFRNONAA >90 02/03/2013 2055   GFRAA >90 02/03/2013 2055   CBC    Component Value Date/Time   WBC 6.5 02/03/2013 2055   RBC 3.84* 02/03/2013 2055   HGB 11.4* 02/03/2013 2055   HCT 33.0* 02/03/2013 2055   PLT 310 02/03/2013 2055   MCV 85.9 02/03/2013 2055   MCH 29.7 02/03/2013 2055   MCHC 34.5 02/03/2013 2055   RDW 12.4 02/03/2013 2055   LYMPHSABS 2.5 02/03/2013 2055   MONOABS 0.5 02/03/2013 2055   EOSABS 0.5 02/03/2013 2055   BASOSABS 0.0 02/03/2013 2055    Studies/Results: No results found.  Medications: I have reviewed the patient's current medications.  Assessment/Plan:  1. Lumbar HNP with L5 radiculopathy  2. DVT Prophylaxis/Anticoagulation: SCDs., ambulation, dopplers negative  3.  Pain Management: Neurontin 100 mg 3 times a day---increase to 200mg  for radicular pain  - Robaxin and Percocet as needed.  4. Mood/history of depression. No antidepressants prior to admission. Provide emotional support  5. Neuropsych: This patient is capable of making decisions on his/her own behalf.  6. Constipation: augment bowel regimen     Length of stay, days: 4  Sonda Primes , MD 02/07/2013, 8:49 AM

## 2013-02-07 NOTE — Progress Notes (Signed)
Occupational Therapy Session Note  Patient Details  Name: [name] MRN: [id number] Date of Birth: August 02, 1981  Today's Date: 02/07/2013 Time: 4098-1191 Time Calculation (min): 43 min  Short Term Goals: Week 1:  OT Short Term Goal 1 (Week 1): STG's=LTG's d/t ELOS  Skilled Therapeutic Interventions/Progress Updates: ADL-retraining at walk-in shower with emphasis on maintaining back precautions during ADL, home safety, dynamic sitting/standing balance, and functional mobility using rolling walker.   Patient able to describe 3/3 back precautions but requires verbal cues to avoid twisting her trunk during bed mobility and seated bathing.   Patient uses long sponge and reacher skillfully during ADL but requires assist to don shoes and socks.   Endurance, balance and mobility is without deficits however patient has compensated for LE weakness with widened stance, approx 1.5X shoulder width, when rising from seated to standing.  Therapy Documentation Precautions:  Precautions Precautions: Back;Fall Precaution Booklet Issued: Yes (comment) Precaution Comments: Able to independently recall 3/3 back precautions Required Braces or Orthoses: Spinal Brace Spinal Brace: Thoracolumbosacral orthotic;Applied in sitting position Restrictions Weight Bearing Restrictions: No  Pain: Pain Assessment Pain Assessment: 0-10 Pain Score:   8 Pain Type: Surgical pain Pain Location: Back Pain Descriptors / Indicators: Aching Pain Frequency: Constant Pain Onset: On-going Patients Stated Pain Goal: 3 Pain Intervention(s): Medication (See eMAR) Multiple Pain Sites: No  See FIM for current functional status  Therapy/Group: Individual Therapy  Second session: Time: 1330-1430 Time Calculation (min):  60 min  Pain Assessment: 7/10 at right hip; meds provided, RN aware  Skilled Therapeutic Interventions: Therapeutic activity with emphasis on pain management, functional mobility using rolling walker,  U/LE strengthening, general conditioning.   Patient initially reported pain so severe as to require meds first but she accepted motivational encouragement and was receptive to "exercise" in prep for discharge as being beneficial to help her manage pain and reduce a mild headache.  Patient ambulated from room to day room to rehab gym with numerous cues to not "walk like a duck" (waddling with her knees straight and a widened gait), to not twist as she turns, as well as to bend her knees.   After two rest breaks patient arrived at gym and performed NuStep, 15 minutes, levels 3-5, without stopping, rating exertion as 13-14 on BORG scale.   Patient ambulated back to room and transferred to recliner indepenently.  See FIM for current functional status  Therapy/Group: Individual Therapy  Georgeanne Nim 02/07/2013, 12:12 PM

## 2013-02-08 ENCOUNTER — Inpatient Hospital Stay (HOSPITAL_COMMUNITY): Payer: Worker's Compensation | Admitting: Occupational Therapy

## 2013-02-08 NOTE — Progress Notes (Signed)
[id number]  Subjective: No new complaints. No new problems. Occ nausea.  Feeling OK overall.  Objective: Vital signs in last 24 hours: Temp:  [98.2 F (36.8 C)-98.6 F (37 C)] 98.6 F (37 C) (06/01 0500) Pulse Rate:  [73-76] 73 (06/01 0500) Resp:  [18] 18 (06/01 0500) BP: (100-118)/(69-80) 100/69 mmHg (06/01 0500) SpO2:  [97 %-100 %] 97 % (06/01 0500) Weight change:  Last BM Date: 02/07/13  Intake/Output from previous day: 05/31 0701 - 06/01 0700 In: 720 [P.O.:720] Out: -  Last cbgs: CBG (last 3)  No results found for this basename: GLUCAP,  in the last 72 hours   Physical Exam General: No apparent distress    HEENT: moist mucosa Lungs: Normal effort. Lungs clear to auscultation, no crackles or wheezes. Cardiovascular: Regular rate and rhythm, no edema Musculoskeletal:  No change from before Neurological: No new neurological deficits Wounds: N/A    Skin: clear Alert, cooperative   Lab Results: BMET    Component Value Date/Time   NA 135 02/03/2013 2055   K 3.6 02/03/2013 2055   CL 100 02/03/2013 2055   CO2 24 02/03/2013 2055   GLUCOSE 118* 02/03/2013 2055   BUN 11 02/03/2013 2055   CREATININE 0.68 02/03/2013 2055   CALCIUM 8.8 02/03/2013 2055   GFRNONAA >90 02/03/2013 2055   GFRAA >90 02/03/2013 2055   CBC    Component Value Date/Time   WBC 6.5 02/03/2013 2055   RBC 3.84* 02/03/2013 2055   HGB 11.4* 02/03/2013 2055   HCT 33.0* 02/03/2013 2055   PLT 310 02/03/2013 2055   MCV 85.9 02/03/2013 2055   MCH 29.7 02/03/2013 2055   MCHC 34.5 02/03/2013 2055   RDW 12.4 02/03/2013 2055   LYMPHSABS 2.5 02/03/2013 2055   MONOABS 0.5 02/03/2013 2055   EOSABS 0.5 02/03/2013 2055   BASOSABS 0.0 02/03/2013 2055    Studies/Results: No results found.  Medications: I have reviewed the patient's current medications.  Assessment/Plan:  1. Lumbar HNP with L5 radiculopathy  2. DVT Prophylaxis/Anticoagulation: SCDs., ambulation, dopplers negative  3. Pain Management: Neurontin 100 mg 3  times a day---increase to 200mg  for radicular pain  - Robaxin and Percocet as needed.  4. Mood/history of depression. No antidepressants prior to admission. Provide emotional support  5. Neuropsych: This patient is capable of making decisions on his/her own behalf.  6. Constipation: augment bowel regimen     Length of stay, days: 5  Sonda Primes , MD 02/08/2013, 8:36 AM

## 2013-02-08 NOTE — Progress Notes (Signed)
Occupational Therapy Session Note  Patient Details  Name: [name] MRN: [id number] Date of Birth: [birth year]  Today's Date: 02/08/2013 Time: 1400-1500 Time Calculation (min): 60 min  Skilled Therapeutic Interventions/Progress Updates: Patient tearful upon approach for session and stated that she was worried about her children being at home with her "impatient and yelling" husband.  After talking with this clinician while she completed her therapy, she became less tearful about the situation.  Patient was able to don her brace EOB independently.  She walked to/fr therapy gym via RW and completed NuStep 20 min and endurance activities.  Patient tolerated session well and decided to ly down and take a nap after completing OT session today    Therapy Documentation Precautions:  Precautions Precautions: Back;Fall Precaution Booklet Issued: Yes (comment) Precaution Comments: Able to independently recall 3/3 back precautions Required Braces or Orthoses: Spinal Brace Spinal Brace: Thoracolumbosacral orthotic;Applied in sitting position Restrictions Weight Bearing Restrictions: No  Pain: "anywhere from 4-7 all the time"  See FIM for current functional status  Therapy/Group: Individual Therapy  Bud Face [facility] 02/08/2013, 4:36 PM

## 2013-02-09 ENCOUNTER — Inpatient Hospital Stay (HOSPITAL_COMMUNITY): Payer: Worker's Compensation | Admitting: Occupational Therapy

## 2013-02-09 ENCOUNTER — Inpatient Hospital Stay (HOSPITAL_COMMUNITY): Payer: Worker's Compensation

## 2013-02-09 NOTE — Progress Notes (Signed)
Occupational Therapy Session Notes  Patient Details  Name: [name] MRN: [id number] Date of Birth: Sep 22, 1980  Today's Date: 02/09/2013  Short Term Goals: Week 1:  OT Short Term Goal 1 (Week 1): STG's=LTG's d/t ELOS  Skilled Therapeutic Interventions/Progress Updates:  Session #1: Time: [phone] (65 mins) Pt reported 7/10 pain in RLE. RN notified.  Upon entering room, pt supine in bed. Pt supine->EOB to don TLSO. Pt ambulated around room using R/W to collect clothes and toiletries for bathing/dressing task. Pt ambulated ->ADL apt with one rest break (~150 ft total) focusing on fxal endurance/mobility. Pt transferred ->tub bench using R/W and completed bathing/dressing from tub bench level with use of AE. TLSO donned in seated position then sit->stand for pulling up pants. Pt completed grooming task standing @ sink. Pt adhered to all back precautions during BADL's. Next, pt ambulated->room (~150 ft) w/o rest breaks. Once in the room, pt ambulated->BR using R/W and completed toileting task. At end of tx session, pt seated in w/c with call bell and phone within reach.  Session #2: Time: 1115-1200 (45 mins) Pt with no c/o pain.  Upon entering room, pt seated in W/C. Skilled intervention focused on overall fxal endurance, home mgmt tasks (light cleaning, meal prep, laundry), implementation of an UE HEP, fxal mobility, energy conservation, adherence to back precautions during IADL's, and safety in the kitchen. Pt issued elastic shoe laces to increase independence with donning/doffing of shoes. At end of session, pt ambulating with R/W back to room.  Therapy Documentation Precautions:  Precautions Precautions: Back;Fall Precaution Booklet Issued: Yes (comment) Precaution Comments: Able to independently recall 3/3 back precautions Required Braces or Orthoses: Spinal Brace Spinal Brace: Applied in sitting position;Thoracolumbosacral orthotic Restrictions Weight Bearing Restrictions:  No  See FIM for current functional status  Therapy/Group: Individual Therapy  Kissy Cielo 02/09/2013, 11:07 AM

## 2013-02-09 NOTE — Plan of Care (Signed)
Problem: RH SKIN INTEGRITY Goal: RH STG SKIN FREE OF INFECTION/BREAKDOWN Skin free of infection and no new skin breakdown by discharge with minimal assistance.  Outcome: Completed/Met Date Met:  02/09/13 Incision healed. Steristrips x 3 remaining on incisional line

## 2013-02-09 NOTE — Progress Notes (Signed)
Occupational Therapy Discharge Summary  Patient Details  Name: [name] MRN: [id number] Date of Birth: 1981-03-20  Today's Date: 02/09/2013  Patient has met 12 of 12 long term goals due to improved activity tolerance, improved balance, postural control, ability to compensate for deficits and improved coordination.  Patient to discharge at overall Modified Independent level for BADL's and IADL's. No family present for education. Pt made great progress while on CIR and pt is pleased with her progress. Pt will benefit from using AE and DME at home.  Reasons goals not met: N/A all goals met at this time.  Recommendation:  No follow-up OT at this time.  Equipment: Tub transfer bench, BSC (pt has one at home), AE (long-handled sponge, reacher, long-handled shoe horn, sock aid)  Reasons for discharge: treatment goals met and discharge from hospital  Patient/family agrees with progress made and goals achieved: Yes  OT Discharge Precautions/Restrictions  Precautions Precautions: Back;Fall Required Braces or Orthoses: Spinal Brace Spinal Brace: Applied in sitting position;Thoracolumbosacral orthotic Restrictions Weight Bearing Restrictions: No  Pain Pain Assessment Pain Assessment: 0-10 Pain Score:   7 Pain Type: Other (Comment) ("pulling" pain) Pain Location: Leg Pain Orientation: Right Pain Descriptors / Indicators:  (pulling pain) Pain Onset: On-going Pain Intervention(s): RN made aware Multiple Pain Sites: No  ADL:  See FIM.  Vision/Perception  Vision - History Baseline Vision: No visual deficits Patient Visual Report:  (pt reports vision has improved since eval) Vision - Assessment Eye Alignment: Within Functional Limits Perception Perception: Within Functional Limits Praxis Praxis: Intact   Cognition Overall Cognitive Status: Within Functional Limits for tasks assessed Arousal/Alertness: Awake/alert Orientation Level: Oriented X4 Attention: Focused Focused  Attention: Appears intact Memory: Appears intact Awareness: Appears intact Problem Solving: Appears intact Safety/Judgment: Appears intact  Sensation Sensation Light Touch: Impaired Detail Light Touch Impaired Details: Impaired RLE (same areas of admission; per pt report slightly improved) Additional Comments: BUE's appear intact Coordination Gross Motor Movements are Fluid and Coordinated: Yes Fine Motor Movements are Fluid and Coordinated: Yes  Motor  Motor Motor: Within Functional Limits  Trunk/Postural Assessment  Cervical Assessment Cervical Assessment: Within Functional Limits Thoracic Assessment Thoracic Assessment: Exceptions to [facility] (back precautions and TLSO) Lumbar Assessment Lumbar Assessment: Exceptions to [facility] (back precautions and TLSO)   Balance Static Sitting Balance Static Sitting - Level of Assistance: 6: Modified independent (Device/Increase time) Dynamic Sitting Balance Dynamic Sitting - Level of Assistance: 6: Modified independent (Device/Increase time) Static Standing Balance Static Standing - Level of Assistance: 6: Modified independent (Device/Increase time) Dynamic Standing Balance Dynamic Standing - Level of Assistance: 6: Modified independent (Device/Increase time)  Extremity/Trunk Assessment RUE Assessment RUE Assessment: Within Functional Limits LUE Assessment LUE Assessment: Within Functional Limits  See FIM for current functional status  Blaire Palomino 02/09/2013, 11:08 AM

## 2013-02-09 NOTE — Progress Notes (Signed)
Physical Therapy Discharge Summary  Patient Details  Name: [name] MRN: [id number] Date of Birth: March 07, 1981  Today's Date: 02/09/2013  Session #1 Time: 0930-1030 Time Calculation (min): 60 min Premedicated for back and leg pain. Session focused on grad day activities including gait in home and controlled environments, stair negotiation up/down flight of steps with 1 rail x 2 mod I, bed mobility in ADL apartment, and Nustep for endurance and strengthening x 15 min on level 5. Pt overall mod I with all mobility and has no concerns in regards to d/c at this time.  Session #2 Time: 1305-1330 (25 min) Denies pain. Focused session on outdoor gait with RW to practice in community setting and on uneven surfaces S to mod I. Long distance w/c propulsion for endurance and strengthening navigating uneven surfaces and elevator. Pt mod I in room with RW.   Patient has met 8 of 8 long term goals due to improved activity tolerance, improved balance, improved postural control, increased strength and decreased pain.  Patient to discharge at an ambulatory level Modified Independent with RW. No formal family education completed due to pt overall mod I level with RW.  Reasons goals not met: n/a  Recommendation:  Patient will benefit from ongoing skilled PT services in home health setting to continue to advance safe functional mobility, address ongoing impairments in gait, balance, endurance, strength, and minimize fall risk.  Equipment: No equipment provided. Pt already owns RW.  Reasons for discharge: treatment goals met and discharge from hospital  Patient/family agrees with progress made and goals achieved: Yes  PT Discharge Precautions/Restrictions Precautions Precautions: Back;Fall Required Braces or Orthoses: Spinal Brace Spinal Brace: Applied in sitting position;Thoracolumbosacral orthotic Restrictions Weight Bearing Restrictions: No Vision/Perception  Vision - History Baseline  Vision: No visual deficits Patient Visual Report:  (pt reports vision has improved since eval) Vision - Assessment Eye Alignment: Within Functional Limits Perception Perception: Within Functional Limits Praxis Praxis: Intact  Cognition Overall Cognitive Status: Within Functional Limits for tasks assessed Arousal/Alertness: Awake/alert Orientation Level: Oriented X4 Attention: Focused Focused Attention: Appears intact Memory: Appears intact Awareness: Appears intact Problem Solving: Appears intact Safety/Judgment: Appears intact Sensation Sensation Light Touch: Impaired Detail Light Touch Impaired Details: Impaired RLE (same areas of admission; per pt report slightly improved) Additional Comments: BUE's appear intact Coordination Gross Motor Movements are Fluid and Coordinated: Yes Fine Motor Movements are Fluid and Coordinated: Yes Motor  Motor Motor: Within Functional Limits  Locomotion  Ambulation Ambulation/Gait Assistance: 6: Modified independent (Device/Increase time)  Trunk/Postural Assessment  Cervical Assessment Cervical Assessment: Within Functional Limits Thoracic Assessment Thoracic Assessment: Exceptions to [facility] (back precautions and TLSO) Lumbar Assessment Lumbar Assessment: Exceptions to [facility] (back precautions and TLSO)  Balance Static Sitting Balance Static Sitting - Level of Assistance: 6: Modified independent (Device/Increase time) Dynamic Sitting Balance Dynamic Sitting - Level of Assistance: 6: Modified independent (Device/Increase time) Static Standing Balance Static Standing - Level of Assistance: 6: Modified independent (Device/Increase time) Dynamic Standing Balance Dynamic Standing - Level of Assistance: 6: Modified independent (Device/Increase time) Extremity Assessment  RUE Assessment RUE Assessment: Within Functional Limits LUE Assessment LUE Assessment: Within Functional Limits RLE Assessment RLE Assessment: Exceptions to [facility] (grossly 3+  to 4-/5; pain limiting still) LLE Assessment LLE Assessment: Within Functional Limits (decreased muscular endurance)  See FIM for current functional status  Karolee Stamps [facility] 02/09/2013, 9:56 AM

## 2013-02-09 NOTE — Discharge Summary (Signed)
  Discharge summary job [id number]

## 2013-02-09 NOTE — Progress Notes (Signed)
Subjective/Complaints: No majro issue over weekend. Up with PT ambulating already this am.   A 12 point review of systems has been performed and if not noted above is otherwise negative.   Objective: Vital Signs: Blood pressure 101/70, pulse 80, temperature 98.3 F (36.8 C), temperature source Oral, resp. rate 18, height 5\' 4"  (1.626 m), weight 87.9 kg (193 lb 12.6 oz), last menstrual period 11/22/2012, SpO2 98.00%. No results found. No results found for this basename: WBC, HGB, HCT, PLT,  in the last 72 hours No results found for this basename: NA, K, CL, CO, GLUCOSE, BUN, CREATININE, CALCIUM,  in the last 72 hours CBG (last 3)  No results found for this basename: GLUCAP,  in the last 72 hours  Wt Readings from Last 3 Encounters:  02/06/13 87.9 kg (193 lb 12.6 oz)  01/29/13 90.8 kg (200 lb 2.8 oz)  01/29/13 90.8 kg (200 lb 2.8 oz)    Physical Exam:  Constitutional: She is oriented to person, place, and time. NAD Obese  HENT:  Head: Normocephalic and atraumatic.  Right Ear: External ear normal.  Left Ear: External ear normal.  Eyes: EOM are normal.  Neck: Normal range of motion. Neck supple. No JVD present. No tracheal deviation present. Thyromegaly present.  Cardiovascular: Normal rate and regular rhythm.  Pulmonary/Chest: Effort normal and breath sounds normal. No respiratory distress.  Abdominal: Soft. Bowel sounds are normal. She exhibits no distension.  Lymphadenopathy:  She has no cervical adenopathy.  Neurological: [name] and oriented to person, place, and time. She displays normal reflexes.  UE near 5/5. RLE is 4-/5 prox to 4+/5 distal with ADF and APF. Mild to minimal sensory loss over right anterior foot and leg. LLE is 4/5 prox to 5/5 distally.  Skin:  Wound clean and intact, well-approximated, no drainage. Psychiatric: She has a normal mood and affect. Her behavior is normal. Judgment and thought content normal    Assessment/Plan: 1. 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 Physiatrist is providing close team supervision and 24 hour management of active medical problems listed below. Physiatrist and rehab team continue to assess barriers to discharge/monitor patient progress toward functional and medical goals.  Home today.   FIM: FIM - Bathing Bathing Steps Patient Completed: Chest;Right Arm;Left Arm;Abdomen;Front perineal area;Right upper leg;Left upper leg;Right lower leg (including foot);Left lower leg (including foot);Buttocks Bathing: 6: Assistive device (Comment)  FIM - Upper Body Dressing/Undressing Upper body dressing/undressing steps patient completed: Thread/unthread right sleeve of pullover shirt/dresss;Thread/unthread left sleeve of pullover shirt/dress;Put head through opening of pull over shirt/dress;Pull shirt over trunk Upper body dressing/undressing: 5: Set-up assist to: Obtain clothing/put away FIM - Lower Body Dressing/Undressing Lower body dressing/undressing steps patient completed: Thread/unthread right pants leg;Thread/unthread left pants leg;Pull pants up/down Lower body dressing/undressing: 4: Min-Patient completed 75 plus % of tasks  FIM - Toileting Toileting steps completed by patient: Adjust clothing prior to toileting;Performs perineal hygiene;Adjust clothing after toileting Toileting Assistive Devices: Grab bar or rail for support Toileting: 5: Supervision: Safety issues/verbal cues  FIM - Diplomatic Services operational officer Devices: Walker;Elevated toilet seat;Grab bars Toilet Transfers: 5-To toilet/BSC: Supervision (verbal cues/safety issues);5-From toilet/BSC: Supervision (verbal cues/safety issues)  FIM - Bed/Chair Transfer Bed/Chair Transfer Assistive Devices: Bed rails;HOB elevated Bed/Chair Transfer: 6: Supine > Sit: No  assist  FIM - Locomotion: Wheelchair Locomotion: Wheelchair: 0: Activity did not occur FIM -  Locomotion: Ambulation Locomotion: Ambulation Assistive Devices: Walker - Rolling;Orthosis Ambulation/Gait Assistance: 5: Supervision Locomotion: Ambulation: 5: Travels 150 ft or more with supervision/safety issues  Comprehension Comprehension Mode: Auditory Comprehension: 5-Understands complex 90% of the time/Cues < 10% of the time  Expression Expression Mode: Verbal Expression: 7-Expresses complex ideas: With no assist  Social Interaction Social Interaction: 7-Interacts appropriately with others - No medications needed.  Problem Solving Problem Solving: 6-Solves complex problems: With extra time  Memory Memory: 5-Recognizes or recalls 90% of the time/requires cueing < 10% of the time  Medical Problem List and Plan:  1. Lumbar HNP with L5 radiculopathy  2. DVT Prophylaxis/Anticoagulation: SCDs., ambulation, dopplers negative 3. Pain Management: Neurontin 100 mg 3 times a day---increased to 200mg  for radicular pain which has helped.  - Robaxin and Percocet as needed.   4. Mood/history of depression. No antidepressants prior to admission. Provide emotional support  5. Neuropsych: This patient is capable of making decisions on his/her own behalf.  6. Constipation: augment bowel regimen  LOS (Days) 6 A FACE TO FACE EVALUATION WAS PERFORMED  Dewight Catino T 02/09/2013 8:48 AM

## 2013-02-09 NOTE — Progress Notes (Signed)
[facility] Individual Statement of Services  Patient Name:  Erie Sica  Date:  02/09/2013  Welcome to the [facility].  Our goal is to provide you with an individualized program based on your diagnosis and situation, designed to meet your specific needs.  With this comprehensive rehabilitation program, you will be expected to participate in at least 3 hours of rehabilitation therapies Monday-Friday, with modified therapy programming on the weekends.  Your rehabilitation program will include the following services:  Physical Therapy (PT), Occupational Therapy (OT), 24 hour per day rehabilitation nursing, Therapeutic Recreaction (TR), Case Management (Social Worker), Rehabilitation Medicine, Nutrition Services and Pharmacy Services  Weekly team conferences will be held on Tuesdays to discuss your progress.  Your Social Worker will talk with you frequently to get your input and to update you on team discussions.  Team conferences with you and your family in attendance may also be held.  Expected length of stay: 7-9 days  Overall anticipated outcome: modified independent  Depending on your progress and recovery, your program may change. Your Social Worker will coordinate services and will keep you informed of any changes. Your Social Worker's name and contact numbers are listed  below.  The following services may also be recommended but are not provided by the [facility]:   Driving Evaluations  Home Health Rehabiltiation Services  Outpatient Rehabilitatation [facility]  Vocational Rehabilitation   Arrangements will be made to provide these services after discharge if needed.  Arrangements include referral to agencies that provide these services.  Your insurance has been verified to be:  Worker's Compensation Your primary doctor is:  None  Pertinent information will be shared with your doctor and your insurance company.  Social  Worker:  [place], [place] [phone] or ([phone]  Information discussed with and copy given to patient by: Amada Jupiter, 02/09/2013, 12:24 PM

## 2013-02-09 NOTE — Progress Notes (Signed)
Discharge note reviewed and accurately reflects discharge summary and plan.

## 2013-02-09 NOTE — Discharge Summary (Signed)
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  HISTORY OF PRESENT ILLNESS:  This is a 32 year old right-handed female with history of left-sided lumbar L4-5 microdiscectomy June 05, 2013, admitted Jan 28, 2013, with recurrent back pain radiating to the lower extremities.  MRI revealed recurrent left-sided lumbar L4-5 disk herniation with radiculopathy.  Underwent revision L4-5 decompression with removal of left-sided disk fragment causing compression of the L5 nerve with transforaminal lumbar interbody fusion Jan 29, 2013. Postoperative day 1, with increasing right lower extremity pain.  CT revealed slightly medially deviated right L5 screw.  Underwent removal of pedicle screw reinsertion of screw to a more lateral position on 05/23.  Postoperative pain management.  She was fitted with a back brace.  Physical and occupational therapy ongoing.  She was admitted for a comprehensive rehab program.  PAST MEDICAL HISTORY:  See discharge diagnoses.  SOCIAL HISTORY:  She lives with family.  FUNCTIONAL HISTORY:  Prior to admission independent.  FUNCTIONAL STATUS:  Upon admission to rehab services was minimal assist, ambulate 60 feet with a rolling walker.  PHYSICAL EXAMINATION:  VITAL SIGNS:  Blood pressure 97/60, pulse 84, temperature 97.8, respirations 16. GENERAL:  This was an alert female, in no acute distress, oriented x3. LUNGS:  Clear to auscultation. CARDIAC:  Regular rate and rhythm. ABDOMEN:  Soft, nontender.   Good bowel sounds.  REHABILITATION HOSPITAL COURSE:  The patient was admitted to inpatient rehab services with therapies initiated on a 3-hour daily basis consisting of physical therapy, occupational therapy, and 24-hour rehabilitation nursing.  The following issues were addressed during the patient's rehabilitation stay.  Pertaining to [name] lumbar radiculopathy, she had undergone surgical repair per Dr. Yevette Edwards. Surgical site healing nicely.  She is maintained on sequential compression devices for DVT prophylaxis.  Venous Doppler studies negative.  Pain control with the use of Neurontin 200 mg 3 times daily as well as Robaxin with Percocet as needed.  She did have some bouts of constipation resolved with laxative assistance.  She was voiding without difficulty.  The patient received weekly collaborative interdisciplinary team conferences to discuss estimated length of stay, family teaching, and any barriers to discharge.  She was ambulating extended distances with an assistive device 200 feet modified independence with a back brace in place, occupational therapy, needing some assistance for a lower body activities of daily living.  The patient uses a long sponge and reacher skillfully during activities of daily living.  She exhibited no unsafe behavior.  Plan was to be discharged to home February 10, 2013, with ongoing therapies dictated as per rehab services.  DISCHARGE MEDICATIONS:  Neurontin 300 mg p.o. t.i.d., Robaxin 500 mg p.o. every 6 hours as needed for muscle spasms, Percocet 5-325, 1 or 2 tablets every 4 hours  as needed for pain, Tylenol as needed.  DIET:  Regular.  SPECIAL INSTRUCTIONS:  Back brace when out of bed.  The patient would follow up with Dr. Faith Rogue at the outpatient rehab service office as needed.  Dr. Estill Bamberg, Orthopedic Services in 2 weeks, call for appointment.     Mariam Dollar, P.A.   ______________________________ Ranelle Oyster, M.D.    DA/MEDQ  D:  02/09/2013  T:  02/09/2013  Job:  [id number]  cc:   Ranelle Oyster, M.D. Estill Bamberg, MD

## 2013-02-09 NOTE — Progress Notes (Signed)
Note reviewed and accurately reflects treatment session.   

## 2013-02-09 NOTE — Progress Notes (Signed)
Social Work  Social Work Assessment and Plan  Patient Details  Name: [name] MRN: [id number] Date of Birth: May 19, 1981  Today's Date: 02/09/2013  Problem List:  Patient Active Problem List   Diagnosis Date Noted  . HNP (herniated nucleus pulposus), lumbar 02/04/2013   Past Medical History:  Past Medical History  Diagnosis Date  . Neck pain   . Migraines   . Depression   . Anxiety     relative to circumstances to back problems/injury, not taking any medicine, pt. reports has been crying a lot  . Asthma   . GERD (gastroesophageal reflux disease)   . Arthritis     low back   Past Surgical History:  Past Surgical History  Procedure Laterality Date  . Lumbar laminectomy/decompression microdiscectomy  06/05/2012    Procedure: LUMBAR LAMINECTOMY/DECOMPRESSION MICRODISCECTOMY;  Surgeon: Emilee Hero, MD;  Location: [facility] OR;  Service: Orthopedics;  Laterality: Left;  Left sided lumbar 4-5 microdisectomy  . Childbirth      x4 vaginal    Social History:  reports that she has never smoked. She has never used smokeless tobacco. She reports that she does not drink alcohol or use illicit drugs.  Family / Support Systems Marital Status: Single (has been together with bf x 15 yrs. He is father to children) Patient Roles: Parent Spouse/Significant Other: boyfriend, Franki Cabot @ [phone] Children: 4 children ages 73, 56, 69 and 32 yrs old Anticipated Caregiver: Mod Independent goals Ability/Limitations of Caregiver: boyfriend does work f/t;  older children can assist and will be out of school for summer next week. Caregiver Availability: Other (Comment) (Husband works very long hours and out of town at times.) [employer]: pt describes very good support of boyfriend and children  Social History Preferred language: Spanish Religion: Christian Cultural Background: [place] - moved to [place]. 16 yrs ago Education: HS Read: Yes Write: Yes Employment Status: Employed (out  on WC/ disability almost one year) Name of Employer: [employer] Return to Work Plans: TBD Fish farm manager Issues: currently out on [facility] claim Guardian/Conservator: none   Abuse/Neglect Physical Abuse: Denies Verbal Abuse: Denies Sexual Abuse: Denies Exploitation of patient/patient's resources: Denies Self-Neglect: Denies  Emotional Status Pt's affect, behavior adn adjustment status: Pt very pleasant, talkative and hopeful about this surgery which she "waited a long time for."  Becomes tearful at times when she speaks about her children and her inability to do activities with them "like a normal Mom".  BDI screen = 3.  Pt does feel "depressed" - will speak with MD about possible medications for her. Recent Psychosocial Issues: None significant beyond long and drawn out experience with this injury and prior failed attempts with surgery. Pyschiatric History: None Substance Abuse History: None  Patient / Family Perceptions, Expectations & Goals Pt/Family understanding of illness & functional limitations: Pt with basic understanding of her injury, surgery performed and current functional limitaitons/ need for CIR Premorbid pt/family roles/activities: Pt has been limited since initial injury and has had to rely on older children and husband to assist with home management. Anticipated changes in roles/activities/participation: Expect limited change as she has had to compensate for some time PTA. Pt/family expectations/goals: "I want to be able to do things with my children again."  [employer]: None Premorbid Home Care/DME Agencies: None Transportation available at discharge: yes Resource referrals recommended: Psychology  Discharge Planning Living Arrangements: Spouse/significant other;Children Support Systems: Spouse/significant other;Children Type of Residence: Private residence Insurance Resources: Media planner (specify) (WORKER'S  COMP)  Financial Resources: Other (Comment) (WC) Financial Screen Referred: No Living Expenses: Rent Money Management: Spouse Do you have any problems obtaining your medications?: No Home Management: pt/ family assist Patient/Family Preliminary Plans: Pt plans to return home with her family with older children and husband assisting Social Work Anticipated Follow Up Needs: HH/OP Expected length of stay: 5-7 days  Clinical Impression Very pleasant woman here after second back surgery due to work injury.  Good family support, however, primary assistance will be provided by teen-aged children (husband works f/t).  Pt is expected to reach mod i goals with most activities.  Admits low level of depression - will speak with MD.  Alonza Bogus, Varie Machamer 02/09/2013, 9:23 AM

## 2013-02-10 MED ORDER — PNEUMOCOCCAL VAC POLYVALENT 25 MCG/0.5ML IJ INJ
0.5000 mL | INJECTION | INTRAMUSCULAR | Status: AC | PRN
  Administered 2013-02-10: 0.5 mL via INTRAMUSCULAR
  Filled 2013-02-10: qty 0.5

## 2013-02-10 MED ORDER — GABAPENTIN 300 MG PO CAPS
300.0000 mg | ORAL_CAPSULE | Freq: Three times a day (TID) | ORAL | Status: DC
  Administered 2013-02-10: 300 mg via ORAL
  Filled 2013-02-10 (×4): qty 1

## 2013-02-10 MED ORDER — GABAPENTIN 300 MG PO CAPS: 300.0000 mg | ORAL_CAPSULE | Freq: Three times a day (TID) | ORAL | Status: DC

## 2013-02-10 MED ORDER — METHOCARBAMOL 500 MG PO TABS: 500.0000 mg | ORAL_TABLET | Freq: Four times a day (QID) | ORAL | Status: DC | PRN

## 2013-02-10 MED ORDER — PNEUMOCOCCAL VAC POLYVALENT 25 MCG/0.5ML IJ INJ: 0.5000 mL | INJECTION | INTRAMUSCULAR | Status: DC

## 2013-02-10 MED ORDER — ACETAMINOPHEN 325 MG PO TABS: 325.0000 mg | ORAL_TABLET | ORAL | Status: DC | PRN

## 2013-02-10 MED ORDER — OXYCODONE-ACETAMINOPHEN 5-325 MG PO TABS: 1.0000 | ORAL_TABLET | ORAL | Status: DC | PRN

## 2013-02-10 NOTE — Progress Notes (Signed)
Subjective/Complaints: Had a good day. Pain is better but still shooting in legs.   A 12 point review of systems has been performed and if not noted above is otherwise negative.   Objective: Vital Signs: Blood pressure 101/66, pulse 79, temperature 98.1 F (36.7 C), temperature source Oral, resp. rate 18, height 5\' 4"  (1.626 m), weight 87.9 kg (193 lb 12.6 oz), last menstrual period 11/22/2012, SpO2 100.00%. No results found. No results found for this basename: WBC, HGB, HCT, PLT,  in the last 72 hours No results found for this basename: NA, K, CL, CO, GLUCOSE, BUN, CREATININE, CALCIUM,  in the last 72 hours CBG (last 3)  No results found for this basename: GLUCAP,  in the last 72 hours  Wt Readings from Last 3 Encounters:  02/06/13 87.9 kg (193 lb 12.6 oz)  01/29/13 90.8 kg (200 lb 2.8 oz)  01/29/13 90.8 kg (200 lb 2.8 oz)    Physical Exam:  Constitutional: She is oriented to person, place, and time. NAD Obese  HENT:  Head: Normocephalic and atraumatic.  Right Ear: External ear normal.  Left Ear: External ear normal.  Eyes: EOM are normal.  Neck: Normal range of motion. Neck supple. No JVD present. No tracheal deviation present. Thyromegaly present.  Cardiovascular: Normal rate and regular rhythm.  Pulmonary/Chest: Effort normal and breath sounds normal. No respiratory distress.  Abdominal: Soft. Bowel sounds are normal. She exhibits no distension.  Lymphadenopathy:  She has no cervical adenopathy.  Neurological: [name] and oriented to person, place, and time. She displays normal reflexes.  UE near 5/5. RLE is 4-/5 prox to 4+/5 distal with ADF and APF. Mild to minimal sensory loss over right anterior foot and leg. LLE is 4/5 prox to 5/5 distally.  Skin:  Wound clean and intact, well-approximated, no drainage. Psychiatric: She has a normal mood and affect. Her behavior is normal. Judgment and thought content normal    Assessment/Plan: 1. 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 Physiatrist is providing close team supervision and 24 hour management of active medical problems listed below. Physiatrist and rehab team continue to assess barriers to discharge/monitor patient progress toward functional and medical goals.  Home today.   FIM: FIM - Bathing Bathing Steps Patient Completed: Chest;Right Arm;Left Arm;Abdomen;Front perineal area;Right upper leg;Left upper leg;Right lower leg (including foot);Left lower leg (including foot);Buttocks Bathing: 6: Assistive device (Comment)  FIM - Upper Body Dressing/Undressing Upper body dressing/undressing steps patient completed: Thread/unthread right sleeve of pullover shirt/dresss;Thread/unthread left sleeve of pullover shirt/dress;Put head through opening of pull over shirt/dress;Pull shirt over trunk Upper body dressing/undressing: 7: Complete Independence: No helper FIM - Lower Body Dressing/Undressing Lower body dressing/undressing steps patient completed: Thread/unthread right pants leg;Thread/unthread left pants leg;Pull pants up/down;Don/Doff right sock;Don/Doff left sock;Don/Doff right shoe;Don/Doff left shoe;Fasten/unfasten right shoe;Fasten/unfasten left shoe Lower body dressing/undressing: 6: Assistive device (Comment)  FIM - Toileting Toileting steps completed by patient: Adjust clothing prior to toileting;Performs perineal hygiene;Adjust clothing after toileting Toileting Assistive Devices: Grab bar or rail for support Toileting: 6: Assistive device: No helper  FIM - Diplomatic Services operational officer Devices: Elevated toilet seat;Grab bars;Walker Toilet Transfers: 6-Assistive device: No helper  FIM - Banker Devices: Orthosis Bed/Chair Transfer: 6: Supine > Sit: No assist  FIM -  Locomotion: Wheelchair Locomotion: Wheelchair: 0: Activity did not occur FIM - Locomotion: Ambulation  Locomotion: Ambulation Assistive Devices: Walker - Rolling;Orthosis Ambulation/Gait Assistance: 6: Modified independent (Device/Increase time) Locomotion: Ambulation: 6: Travels 150 ft or more independently/takes more than reasonable amount of time  Comprehension Comprehension Mode: Auditory Comprehension: 5-Understands complex 90% of the time/Cues < 10% of the time  Expression Expression Mode: Verbal Expression: 7-Expresses complex ideas: With no assist  Social Interaction Social Interaction Mode: Asleep Social Interaction: 7-Interacts appropriately with others - No medications needed.  Problem Solving Problem Solving: 6-Solves complex problems: With extra time  Memory Memory: 5-Recognizes or recalls 90% of the time/requires cueing < 10% of the time  Medical Problem List and Plan:  1. Lumbar HNP with L5 radiculopathy  2. DVT Prophylaxis/Anticoagulation: SCDs., ambulation, dopplers negative 3. Pain Management: Neurontin 100 mg 3 times a day---increase to 300mg  tid  - she would like to see me in follow up as outpt.  - Robaxin and Percocet as needed.   4. Mood/history of depression. No antidepressants prior to admission. Provide emotional support  5. Neuropsych: This patient is capable of making decisions on his/her own behalf.  6. Constipation: augment bowel regimen  LOS (Days) 7 A FACE TO FACE EVALUATION WAS PERFORMED  SWARTZ,ZACHARY T 02/10/2013 7:32 AM

## 2013-02-10 NOTE — Patient Care Conference (Signed)
Inpatient RehabilitationTeam Conference and Plan of Care Update Date: 02/10/2013   Time: 2:40 PM    Patient Name: [name]      Medical Record Number: [id number]  Date of Birth: [birth year] Sex: Female         Room/Bed: 4009/4009-01 Payor Info: Payor: GENERIC COMMERCIAL / Plan: GENERIC COMMERCIAL / Product Type: *No Product type* /    Admitting Diagnosis: Lumbar HNP  Admit Date/Time:  02/03/2013  5:29 PM Admission Comments: No comment available   Primary Diagnosis:  HNP (herniated nucleus pulposus), lumbar Principal Problem: HNP (herniated nucleus pulposus), lumbar  Patient Active Problem List   Diagnosis Date Noted  . HNP (herniated nucleus pulposus), lumbar 02/04/2013    Expected Discharge Date: Expected Discharge Date: 02/10/13  Team Members Present: Physician leading conference: Dr. Faith Rogue Social Worker Present: Amada Jupiter, LCSW Nurse Present: Other (comment) Milly Jakob Rcom, RN) PT Present: Karolee Stamps, PT OT Present: Edwin Cap, OT Other (Discipline and Name): Ottie Glazier, RN;  Tora Duck, PPS Coordinator     Current Status/Progress Goal Weekly Team Focus  Medical   pain better. adjusting meds. still in brace  finalize medical issues for dc  pain control,    Bowel/Bladder   Continent of bowel and bladder. LBM 02/08/13  Pt to remain continent of bowel and bladder.  Monitor   Swallow/Nutrition/ Hydration             ADL's   Mod I   Mod I  D/C planning   Mobility   mod I with RW for transfers, gait and stairs  mod I with RW for transfers, gait and stairs  All goals met - d/c   Communication             Safety/Cognition/ Behavioral Observations            Pain   Scheduled Neurotin 200mg ,  Percocet 1-2 tabs q 4hrs prn,   <3  Monitor for effectiveness   Skin   Lumbar incision healing appropriately. Steristrips x 3 to area  No additional skin breakdown  Routine turn q 2hrs    Rehab Goals Patient on target to meet rehab goals: Yes *See Care Plan  and progress notes for long and short-term goals.  Barriers to Discharge: none    Possible Resolutions to Barriers:  none    Discharge Planning/Teaching Needs:  home with family able to assist - follow up HHPT      Team Discussion:  Has made excellent gains and ready for d/c  Revisions to Treatment Plan:  None   Continued Need for Acute Rehabilitation Level of Care: The patient requires daily medical management by a physician with specialized training in physical medicine and rehabilitation for the following conditions: Daily direction of a multidisciplinary physical rehabilitation program to ensure safe treatment while eliciting the highest outcome that is of practical value to the patient.: Yes Daily medical management of patient stability for increased activity during participation in an intensive rehabilitation regime.: Yes Daily analysis of laboratory values and/or radiology reports with any subsequent need for medication adjustment of medical intervention for : Post surgical problems;Neurological problems  Dene Landsberg 02/10/2013, 3:17 PM

## 2013-02-10 NOTE — Plan of Care (Signed)
Problem: RH PAIN MANAGEMENT Goal: RH STG PAIN MANAGED AT OR BELOW PT'S PAIN GOAL Pain level < 4.  Outcome: Adequate for Discharge Manage with pain medications robaxin and oxycodone.

## 2013-02-10 NOTE — Progress Notes (Signed)
Social Work  Discharge Note  The overall goal for the admission was met for:   Discharge location: Yes - home with family  Length of Stay: Yes - 7 days  Discharge activity level: Yes - modified independent  Home/community participation: Yes  Services provided included: MD, RD, PT, OT, RN, TR, Pharmacy and SW  Financial Services: Worker's Comp  Follow-up services arranged: Home Health: PT (arranged via WC CM), DME: tub bench and hip kit via [facility] and Patient/Family has no preference for HH/DME agencies  Comments (or additional information):  Patient/Family verbalized understanding of follow-up arrangements: Yes  Individual responsible for coordination of the follow-up plan: patient  Confirmed correct DME delivered: Esdras Delair 02/10/2013    Lizzette Carbonell

## 2013-02-10 NOTE — Progress Notes (Signed)
Patient discharged to home at 1208 with a family member.  Pneumococcal PPSV23 given prior to discharge.  Discharge instruction provided by Harvel Ricks, PA.  Patient verbalized understanding, no further question ask.  Patient escorted off unit by rehab NT.

## 2013-03-04 ENCOUNTER — Encounter: Payer: Worker's Compensation | Admitting: Physical Medicine & Rehabilitation

## 2013-04-07 ENCOUNTER — Encounter
Payer: Worker's Compensation | Attending: Physical Medicine & Rehabilitation | Admitting: Physical Medicine & Rehabilitation

## 2013-05-29 ENCOUNTER — Emergency Department (HOSPITAL_COMMUNITY)
Admission: EM | Admit: 2013-05-29 | Discharge: 2013-05-29 | Disposition: A | Discharge status: Other Acute Inpatient Hospital | Payer: Self-pay | Attending: Emergency Medicine | Admitting: Emergency Medicine

## 2013-05-29 ENCOUNTER — Encounter (HOSPITAL_COMMUNITY): Payer: Self-pay | Admitting: Emergency Medicine

## 2013-05-29 DIAGNOSIS — M129 Arthropathy, unspecified: Secondary | ICD-10-CM | POA: Insufficient documentation

## 2013-05-29 DIAGNOSIS — Z0289 Encounter for other administrative examinations: Secondary | ICD-10-CM | POA: Insufficient documentation

## 2013-05-29 DIAGNOSIS — Z3202 Encounter for pregnancy test, result negative: Secondary | ICD-10-CM | POA: Insufficient documentation

## 2013-05-29 DIAGNOSIS — T50912D Poisoning by multiple unspecified drugs, medicaments and biological substances, intentional self-harm, subsequent encounter: Secondary | ICD-10-CM

## 2013-05-29 DIAGNOSIS — F329 Major depressive disorder, single episode, unspecified: Secondary | ICD-10-CM

## 2013-05-29 DIAGNOSIS — F32A Depression, unspecified: Secondary | ICD-10-CM

## 2013-05-29 DIAGNOSIS — F411 Generalized anxiety disorder: Secondary | ICD-10-CM | POA: Insufficient documentation

## 2013-05-29 DIAGNOSIS — G8928 Other chronic postprocedural pain: Secondary | ICD-10-CM

## 2013-05-29 DIAGNOSIS — T50902A Poisoning by unspecified drugs, medicaments and biological substances, intentional self-harm, initial encounter: Secondary | ICD-10-CM

## 2013-05-29 DIAGNOSIS — Z8679 Personal history of other diseases of the circulatory system: Secondary | ICD-10-CM | POA: Insufficient documentation

## 2013-05-29 DIAGNOSIS — J45909 Unspecified asthma, uncomplicated: Secondary | ICD-10-CM | POA: Insufficient documentation

## 2013-05-29 DIAGNOSIS — T50912A Poisoning by multiple unspecified drugs, medicaments and biological substances, intentional self-harm, initial encounter: Secondary | ICD-10-CM

## 2013-05-29 DIAGNOSIS — K219 Gastro-esophageal reflux disease without esophagitis: Secondary | ICD-10-CM | POA: Insufficient documentation

## 2013-05-29 DIAGNOSIS — M549 Dorsalgia, unspecified: Secondary | ICD-10-CM | POA: Insufficient documentation

## 2013-05-29 DIAGNOSIS — T50901A Poisoning by unspecified drugs, medicaments and biological substances, accidental (unintentional), initial encounter: Secondary | ICD-10-CM

## 2013-05-29 DIAGNOSIS — G8929 Other chronic pain: Secondary | ICD-10-CM | POA: Insufficient documentation

## 2013-05-29 DIAGNOSIS — R45851 Suicidal ideations: Secondary | ICD-10-CM | POA: Insufficient documentation

## 2013-05-29 DIAGNOSIS — F4310 Post-traumatic stress disorder, unspecified: Secondary | ICD-10-CM

## 2013-05-29 DIAGNOSIS — F3289 Other specified depressive episodes: Secondary | ICD-10-CM | POA: Insufficient documentation

## 2013-05-29 DIAGNOSIS — Z79899 Other long term (current) drug therapy: Secondary | ICD-10-CM | POA: Insufficient documentation

## 2013-05-29 HISTORY — DX: Post-traumatic stress disorder, unspecified: F43.10

## 2013-05-29 HISTORY — DX: Other chronic postprocedural pain: G89.28

## 2013-05-29 HISTORY — DX: Depression, unspecified: F32.A

## 2013-05-29 HISTORY — DX: Poisoning by multiple unspecified drugs, medicaments and biological substances, intentional self-harm, initial encounter: T50.912A

## 2013-05-29 LAB — RAPID URINE DRUG SCREEN, HOSP PERFORMED
Amphetamines: NOT DETECTED
Barbiturates: NOT DETECTED
Benzodiazepines: NOT DETECTED
Tetrahydrocannabinol: NOT DETECTED

## 2013-05-29 LAB — CBC WITH DIFFERENTIAL/PLATELET
Basophils Relative: 0 % (ref 0–1)
Eosinophils Absolute: 0.2 10*3/uL (ref 0.0–0.7)
Lymphs Abs: 4.1 10*3/uL — ABNORMAL HIGH (ref 0.7–4.0)
MCH: 29.3 pg (ref 26.0–34.0)
Neutrophils Relative %: 41 % — ABNORMAL LOW (ref 43–77)
Platelets: 287 10*3/uL (ref 150–400)
RBC: 4.47 MIL/uL (ref 3.87–5.11)

## 2013-05-29 LAB — PREGNANCY, URINE: Preg Test, Ur: NEGATIVE

## 2013-05-29 LAB — COMPREHENSIVE METABOLIC PANEL
ALT: 33 U/L (ref 0–35)
AST: 23 U/L (ref 0–37)
Albumin: 3.8 g/dL (ref 3.5–5.2)
Alkaline Phosphatase: 93 U/L (ref 39–117)
Glucose, Bld: 108 mg/dL — ABNORMAL HIGH (ref 70–99)
Potassium: 3.6 mEq/L (ref 3.5–5.1)
Sodium: 138 mEq/L (ref 135–145)
Total Protein: 7.8 g/dL (ref 6.0–8.3)

## 2013-05-29 MED ORDER — ALUM & MAG HYDROXIDE-SIMETH 200-200-20 MG/5ML PO SUSP: 30.0000 mL | ORAL | Status: DC | PRN

## 2013-05-29 MED ORDER — LORAZEPAM 1 MG PO TABS: 1.0000 mg | ORAL_TABLET | Freq: Three times a day (TID) | ORAL | Status: DC | PRN

## 2013-05-29 MED ORDER — ALBUTEROL SULFATE HFA 108 (90 BASE) MCG/ACT IN AERS: 2.0000 | INHALATION_SPRAY | RESPIRATORY_TRACT | Status: DC | PRN

## 2013-05-29 MED ORDER — GABAPENTIN 300 MG PO CAPS
300.0000 mg | ORAL_CAPSULE | Freq: Three times a day (TID) | ORAL | Status: DC
  Administered 2013-05-29: 300 mg via ORAL
  Filled 2013-05-29: qty 1

## 2013-05-29 MED ORDER — ZOLPIDEM TARTRATE 5 MG PO TABS: 5.0000 mg | ORAL_TABLET | Freq: Every evening | ORAL | Status: DC | PRN

## 2013-05-29 MED ORDER — ACETAMINOPHEN 325 MG PO TABS: 325.0000 mg | ORAL_TABLET | ORAL | Status: DC | PRN

## 2013-05-29 MED ORDER — ONDANSETRON HCL 4 MG PO TABS: 4.0000 mg | ORAL_TABLET | Freq: Three times a day (TID) | ORAL | Status: DC | PRN

## 2013-05-29 MED ORDER — IBUPROFEN 200 MG PO TABS
600.0000 mg | ORAL_TABLET | Freq: Three times a day (TID) | ORAL | Status: DC | PRN
  Administered 2013-05-29: 600 mg via ORAL
  Filled 2013-05-29: qty 3

## 2013-05-29 NOTE — ED Notes (Signed)
Per EMS: Pt was at orthopedic specialist, has had increasingly worse back pain past couple months, MRI did not show source of pain. Pt stated pain so bad she wants to kill herself. Pt attempted suicide 2 months ago by swallowing pills, but she threw them up.
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
## 2013-05-29 NOTE — ED Notes (Signed)
Pt belongings in locker 33: Black motorola phone, black flip flops, blue jeans, bra, and shirt; etch and sketch, pick up thing, and walker at nurses station

## 2013-05-29 NOTE — ED Notes (Signed)
Unable to move pt to psych ED because pt needs walker to ambulate. Dr Fonnie Jarvis aware.

## 2013-05-29 NOTE — Progress Notes (Signed)
Patient confirms she does not have insurance.  Patient reports she goes to [facility] for her medical needs.  She reports she does not see a particular doctor there.  Offered support to patient.  no further needs at this time.

## 2013-05-29 NOTE — ED Provider Notes (Signed)
CSN: [id number]     Arrival date & time 05/29/13  1521 History   First MD Initiated Contact with Patient 05/29/13 1530     Chief Complaint  Patient presents with  . Medical Clearance   (Consider location/radiation/quality/duration/timing/severity/associated sxs/prior Treatment) HPI Comments: Patient with a history of depression presents to the ED voluntarily with a chief complaint of suicidal thoughts.  She reports that she has a plan to overdose on medications.  She states that she had a prior overdose two months ago.  She states that she has chronic back pain and that the pain is so severe that she does not want to live anymore.  She denies HI.  Denies alcohol or recreational drug use.  The history is provided by the patient.    Past Medical History  Diagnosis Date  . Neck pain   . Migraines   . Depression   . Anxiety     relative to circumstances to back problems/injury, not taking any medicine, pt. reports has been crying a lot  . Asthma   . GERD (gastroesophageal reflux disease)   . Arthritis     low back   Past Surgical History  Procedure Laterality Date  . Lumbar laminectomy/decompression microdiscectomy  06/05/2012    Procedure: LUMBAR LAMINECTOMY/DECOMPRESSION MICRODISCECTOMY;  Surgeon: Emilee Hero, MD;  Location: [facility] OR;  Service: Orthopedics;  Laterality: Left;  Left sided lumbar 4-5 microdisectomy  . Childbirth      x4 vaginal    No family history on file. History  Substance Use Topics  . Smoking status: Never Smoker   . Smokeless tobacco: Never Used  . Alcohol Use: No   OB History   Grav Para Term Preterm Abortions TAB SAB Ect Mult Living                 Review of Systems  Psychiatric/Behavioral: Positive for suicidal ideas.  All other systems reviewed and are negative.    Allergies  Hydrocodone-acetaminophen  Home Medications   Current Outpatient Rx  Name  Route  Sig  Dispense  Refill  . acetaminophen (TYLENOL) 325 MG tablet   Oral    Take 1-2 tablets (325-650 mg total) by mouth every 4 (four) hours as needed.         Marland Kitchen albuterol (PROVENTIL HFA;VENTOLIN HFA) 108 (90 BASE) MCG/ACT inhaler   Inhalation   Inhale 2 puffs into the lungs every 4 (four) hours as needed for wheezing.         . calcium carbonate (TUMS EX) 750 MG chewable tablet   Oral   Chew 1 tablet by mouth 2 (two) times daily as needed for heartburn.         . chlorpheniramine (CHLOR-TRIMETON) 4 MG tablet   Oral   Take 4 mg by mouth every 4 (four) hours as needed for allergies or rhinitis (seasonal allergies).          . gabapentin (NEURONTIN) 300 MG capsule   Oral   Take 1 capsule (300 mg total) by mouth 3 (three) times daily.   90 capsule   1   . Pseudoephedrine-APAP-DM (TYLENOL COLD/FLU SEVERE DAY PO)   Oral   Take 2 tablets by mouth every 6 (six) hours as needed (flu symptoms).          BP 116/83  Pulse 70  Temp(Src) 98.6 F (37 C) (Oral)  Resp 18  SpO2 97% Physical Exam  Nursing note and vitals reviewed. Constitutional: She appears well-developed and well-nourished.  HENT:  Head: Normocephalic and atraumatic.  Mouth/Throat: Oropharynx is clear and moist.  Neck: Normal range of motion. Neck supple.  Cardiovascular: Normal rate, regular rhythm and normal heart sounds.   Pulmonary/Chest: Effort normal and breath sounds normal.  Neurological: [name].  Skin: Skin is warm and dry.  Psychiatric: Her speech is normal and behavior is normal. She is not withdrawn and not actively hallucinating. She exhibits a depressed mood. She expresses suicidal ideation. She expresses no homicidal ideation. She expresses suicidal plans. She expresses no homicidal plans.    ED Course  Procedures (including critical care time) Labs Review Labs Reviewed  CBC WITH DIFFERENTIAL  COMPREHENSIVE METABOLIC PANEL  ETHANOL  URINE RAPID DRUG SCREEN (HOSP PERFORMED)  PREGNANCY, URINE   Imaging Review No results found.  MDM  No diagnosis  found. Patient presents voluntarily with suicidal thoughts with plan to overdose on medication.  Labs unremarkable.  TTS consulted.      Pascal Lux [place], PA-C 05/29/13 1949

## 2013-05-29 NOTE — ED Notes (Signed)
Pt wanded by security. 

## 2013-05-29 NOTE — ED Provider Notes (Signed)
Medical screening examination/treatment/procedure(s) were conducted as a shared visit with non-physician practitioner(s) and myself.  I personally evaluated the patient during the encounter  Chronic back pain for several months chronic depression for several months now several weeks of suicidal ideation with plan to overdose here voluntarily.  Patricia Horn, MD 05/30/13 [phone]

## 2013-05-30 ENCOUNTER — Encounter (HOSPITAL_COMMUNITY): Payer: Self-pay | Admitting: *Deleted

## 2013-05-30 ENCOUNTER — Inpatient Hospital Stay (HOSPITAL_COMMUNITY)
Admission: AD | Admit: 2013-05-30 | Discharge: 2013-06-03 | DRG: 885 | Disposition: A | Discharge status: Home / Self Care | Payer: Federal, State, Local not specified - Other | Source: Intra-hospital | Attending: Psychiatry | Admitting: Psychiatry

## 2013-05-30 DIAGNOSIS — R45851 Suicidal ideations: Secondary | ICD-10-CM

## 2013-05-30 DIAGNOSIS — F32A Depression, unspecified: Secondary | ICD-10-CM

## 2013-05-30 DIAGNOSIS — K219 Gastro-esophageal reflux disease without esophagitis: Secondary | ICD-10-CM | POA: Diagnosis present

## 2013-05-30 DIAGNOSIS — G8928 Other chronic postprocedural pain: Secondary | ICD-10-CM

## 2013-05-30 DIAGNOSIS — F329 Major depressive disorder, single episode, unspecified: Secondary | ICD-10-CM

## 2013-05-30 DIAGNOSIS — J45909 Unspecified asthma, uncomplicated: Secondary | ICD-10-CM | POA: Diagnosis present

## 2013-05-30 DIAGNOSIS — T50912A Poisoning by multiple unspecified drugs, medicaments and biological substances, intentional self-harm, initial encounter: Secondary | ICD-10-CM

## 2013-05-30 DIAGNOSIS — F332 Major depressive disorder, recurrent severe without psychotic features: Principal | ICD-10-CM | POA: Diagnosis present

## 2013-05-30 DIAGNOSIS — M5126 Other intervertebral disc displacement, lumbar region: Secondary | ICD-10-CM

## 2013-05-30 DIAGNOSIS — Z79899 Other long term (current) drug therapy: Secondary | ICD-10-CM

## 2013-05-30 DIAGNOSIS — T50912D Poisoning by multiple unspecified drugs, medicaments and biological substances, intentional self-harm, subsequent encounter: Secondary | ICD-10-CM

## 2013-05-30 DIAGNOSIS — F4310 Post-traumatic stress disorder, unspecified: Secondary | ICD-10-CM | POA: Diagnosis present

## 2013-05-30 MED ORDER — ACETAMINOPHEN 325 MG PO TABS
650.0000 mg | ORAL_TABLET | Freq: Four times a day (QID) | ORAL | Status: DC | PRN
  Administered 2013-05-30 – 2013-06-03 (×10): 650 mg via ORAL
  Filled 2013-05-30: qty 2

## 2013-05-30 MED ORDER — HYDROXYZINE HCL 50 MG PO TABS
50.0000 mg | ORAL_TABLET | Freq: Every evening | ORAL | Status: DC | PRN
  Administered 2013-05-30 – 2013-06-02 (×4): 50 mg via ORAL

## 2013-05-30 MED ORDER — MAGNESIUM HYDROXIDE 400 MG/5ML PO SUSP: 30.0000 mL | Freq: Every day | ORAL | Status: DC | PRN

## 2013-05-30 MED ORDER — ALUM & MAG HYDROXIDE-SIMETH 200-200-20 MG/5ML PO SUSP: 30.0000 mL | ORAL | Status: DC | PRN

## 2013-05-30 MED ORDER — GABAPENTIN 600 MG PO TABS
600.0000 mg | ORAL_TABLET | Freq: Three times a day (TID) | ORAL | Status: DC
  Administered 2013-05-30 – 2013-06-03 (×12): 600 mg via ORAL
  Filled 2013-05-30: qty 42
  Filled 2013-05-30 (×7): qty 1
  Filled 2013-05-30: qty 2
  Filled 2013-05-30: qty 42
  Filled 2013-05-30 (×7): qty 1
  Filled 2013-05-30: qty 2
  Filled 2013-05-30: qty 42

## 2013-05-30 MED ORDER — IBUPROFEN 600 MG PO TABS: 600.0000 mg | ORAL_TABLET | Freq: Three times a day (TID) | ORAL | Status: DC | PRN

## 2013-05-30 MED ORDER — TRAZODONE HCL 50 MG PO TABS: 50.0000 mg | ORAL_TABLET | Freq: Every evening | ORAL | Status: DC | PRN

## 2013-05-30 MED ORDER — LORAZEPAM 1 MG PO TABS: 1.0000 mg | ORAL_TABLET | Freq: Three times a day (TID) | ORAL | Status: DC | PRN

## 2013-05-30 MED ORDER — INFLUENZA VAC SPLIT QUAD 0.5 ML IM SUSP
0.5000 mL | Freq: Once | INTRAMUSCULAR | Status: AC
  Administered 2013-05-30: 0.5 mL via INTRAMUSCULAR
  Filled 2013-05-30: qty 0.5

## 2013-05-30 MED ORDER — DULOXETINE HCL 30 MG PO CPEP
30.0000 mg | ORAL_CAPSULE | Freq: Every day | ORAL | Status: DC
  Administered 2013-05-30 – 2013-06-03 (×5): 30 mg via ORAL
  Filled 2013-05-30 (×4): qty 1
  Filled 2013-05-30: qty 14
  Filled 2013-05-30 (×4): qty 1

## 2013-05-30 MED ORDER — ONDANSETRON HCL 4 MG PO TABS: 4.0000 mg | ORAL_TABLET | Freq: Three times a day (TID) | ORAL | Status: DC | PRN

## 2013-05-30 NOTE — [facility] Group Notes (Signed)
[facility] Group Notes:  (Clinical Social Work)  05/02/2013   3:00-4:00PM  Summary of Progress/Problems:   The main focus of today's process group was for the patient to identify ways in which they have sabotaged their own mental health wellness/recovery.  There was a discussion initially about the concept of recovery, and what that means to different patients.  Motivational interviewing was used to explore the reasons they engage in behavior that is detrimental to recovery, and reasons for wanting to change.  Patients were asked to rate their motivation to change their self-sabotaging behaviors on a scale of 0 (no motivation) to 10 (willing to do whatever is recommended).   The patient expressed that she self sabotages by holding things in, and then exploding emotionally.  She believes that other people are gossiping about her, joking about her problems and illness.  She states her current level of motivation to change is "0" although she wishes it was "10."  She stated she wants to get better first of all for herself, then secondarily for her children.  She does not want them to have this depression and feelings of wanting to die.  She stated her husband does not know or understand why she is in the hospital and admitted to the group that she has lied to him about why she is here.  She does not believe he will understand, because she believes he also has depression with increased sleep (which is a big issue for her), not eating (which is also a problem for her) and drinking alcohol (which she states has caused problems between them.  Type of Therapy:  Process Group  Participation Level:  Active  Participation Quality:  Attentive, Sharing and Supportive  Affect:  Blunted, Depressed, Excited and Tearful  Cognitive:  Oriented  Insight:  Engaged  Engagement in Therapy:  Engaged  Modes of Intervention:  Education, Motivational Interviewing   Surveyor, minerals, LCSW 5:06 PM

## 2013-05-30 NOTE — Progress Notes (Signed)
Adult Psychoeducational Group Note  Date:  05/30/2013 Time:  0900  Group Topic/Focus:  Identifying Needs:   The focus of this group is to help patients identify their personal needs that have been historically problematic and identify healthy behaviors to address their needs.  Participation Level:  Active  Participation Quality:  Appropriate, Attentive and Sharing  Affect:  Depressed  Cognitive:  Alert and Appropriate  Insight: Improving  Engagement in Group:  Engaged  Modes of Intervention:  Discussion, Education and Exploration  Additional Comments:  Pt related personal experience to group discussion  Jediah Horger Shari Prows 05/30/2013, 11:49 AM
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
## 2013-05-30 NOTE — Progress Notes (Signed)
The focus of this group is to help patients review their daily goal of treatment and discuss progress on daily workbooks. Pt attended the evening group session and responded to discussion prompts from the Writer. Pt shared that today was a good day because of how much she laughed with her peers on the hallway and because she received a visit from her family. When asked by the Writer if Pt had any additional needs from staff tonight, Pt requested two more pillows for her back, which were given to her following group. Pt's affect was bright and she appeared receptive of encouraging comments from her peers. The Writer observed the Pt smiling appropriately during group, particularly when discussing the visit from her family.
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
## 2013-05-30 NOTE — Tx Team (Signed)
Initial Interdisciplinary Treatment Plan  PATIENT STRENGTHS: (choose at least two) Ability for insight Average or above average intelligence Capable of independent living General fund of knowledge Supportive family/friends  PATIENT STRESSORS: Health problems Occupational concerns   PROBLEM LIST: Problem List/Patient Goals Date to be addressed Date deferred Reason deferred Estimated date of resolution  Depression 05/30/13     Suicidal Ideation 05/30/13                                                DISCHARGE CRITERIA:  Ability to meet basic life and health needs Improved stabilization in mood, thinking, and/or behavior Verbal commitment to aftercare and medication compliance  PRELIMINARY DISCHARGE PLAN: Attend aftercare/continuing care group Return to previous living arrangement  PATIENT/FAMIILY INVOLVEMENT: This treatment plan has been presented to and reviewed with the patient, [name], and/or family member, .  The patient and family have been given the opportunity to ask questions and make suggestions.  [name], [place] 05/30/2013, 1:43 AM

## 2013-05-30 NOTE — Progress Notes (Signed)
Patient ID: [name], female   DOB: 1981/02/24, 32 y.o.   MRN: [id number] D: Pt is awake and active on the unit this AM. Pt denies SI/HI and A/V hallucinations. Pt rates their depression at 10 and hopelessness at 1. Pt wrote that she wants to "love me, take care of myself and take care of my family." Pt mood is depressed and her affect is flat. Pt c/o frequent mood swings and family conflict. Pt is responsive to group regarding healthy coping skills and seems vested in tx and learning what she can while here at [facility].   A: Encouraged pt to discuss feelings with staff and administered medication per MD orders. Writer also encouraged pt to participate in groups.  R: Pt is attending groups and tolerating medications well. Writer will continue to monitor. 15 minute checks are ongoing for safety.

## 2013-05-31 NOTE — Progress Notes (Signed)
Patient ID: [name], female   DOB: Dec 26, 1980, 32 y.o.   MRN: [id number] D: Pt is awake and active on the unit this AM. Pt denies SI/HI and A/V hallucinations. Pt rates their depression at 9 and hopelessness at 0. Pt's mood is depressed an her affect is flat. Pt writes that she plans to take care of herself and her family and go to school. Pt forwards little to staff but she is active in the milieu.   A: Encouraged pt to discuss feelings with staff and administered medication per MD orders. Writer also encouraged pt to participate in groups.  R: Pt is attending groups and tolerating medications well. Writer will continue to monitor. 15 minute checks are ongoing for safety.

## 2013-05-31 NOTE — Progress Notes (Signed)
Adult Psychoeducational Group Note  Date:  05/31/2013 Time:  8:00pm Group Topic/Focus:  Wrap-Up Group:   The focus of this group is to help patients review their daily goal of treatment and discuss progress on daily workbooks.  Participation Level:  Active  Participation Quality:  Appropriate and Attentive  Affect:  Appropriate  Cognitive:  Alert and Appropriate  Insight: Appropriate  Engagement in Group:  Engaged  Modes of Intervention:  Discussion and Education  Additional Comments:  Pt attended and participated in group. When ask how her day went pt stated she was sleepy and tired from meds but was feeling better after a visit from her kids. Her goal for tomorrow is to feel better and go home.  Shelly Bombard D 05/31/2013, 9:43 PM

## 2013-05-31 NOTE — Progress Notes (Signed)
Adult Psychoeducational Group Note  Date:  05/31/2013 Time:  0900  Group Topic/Focus:  Spirituality:   The focus of this group is to discuss how one's spirituality can aide in recovery.  Participation Level:  Active  Participation Quality:  Appropriate and Attentive  Affect:  Blunted and Depressed  Cognitive:  Alert and Appropriate  Insight: Improving  Engagement in Group:  Limited  Modes of Intervention:  Discussion, Education and Exploration  Additional Comments:  Pt is quiet but attentive during group  Earland Reish Shari Prows 05/31/2013, 12:30 PM

## 2013-05-31 NOTE — Progress Notes (Signed)
[facility] MD Progress Note  05/31/2013 3:39 PM [name]  MRN:  [id number] Subjective:  Patient has been compliant with her medication management and has no side effects. Patient reported she has been less depressed and less anxious. She has less crying spells and not feeling too sad. Patient stated her family members came to visit her and make her feel better. Patient reported she spoke with her husband about her clinical condition and this seems to be able to understand and going to be supportive to her. Patient has been trying to do her best in active participation in unit groups. Patient has been walking with walker due to her chronic back pain.  Diagnosis:   DSM5: Schizophrenia Disorders:   Obsessive-Compulsive Disorders:   Trauma-Stressor Disorders:  Posttraumatic Stress Disorder (309.81) Substance/Addictive Disorders:   Depressive Disorders:  Major Depressive Disorder - Severe (296.23)  Axis I: Major Depression, Recurrent severe and Post Traumatic Stress Disorder  ADL's:  Impaired  Sleep: Fair  Appetite:  Fair  Suicidal Ideation:  Patient endorses suicidal ideation but contracts for safety in the hospital. Homicidal Ideation:  Denied AEB (as evidenced by):  Psychiatric Specialty Exam: ROS  Blood pressure 108/75, pulse 90, temperature 98.5 F (36.9 C), temperature source Oral, resp. rate 16, height 5' (1.524 m), weight 84.369 kg (186 lb).Body mass index is 36.33 kg/(m^2).  General Appearance: Fairly Groomed and Guarded  Patent attorney::  Fair  Speech:  Clear and Coherent  Volume:  Decreased  Mood:  Anxious and Depressed  Affect:  Appropriate and Congruent  Thought Process:  Goal Directed and Intact  Orientation:  Full (Time, Place, and Person)  Thought Content:  Obsessions and Rumination  Suicidal Thoughts:  Yes.  without intent/plan  Homicidal Thoughts:  No  Memory:  Immediate;   Fair  Judgement:  Impaired  Insight:  Lacking  Psychomotor Activity:  Psychomotor  Retardation  Concentration:  Fair  Recall:  Fair  Akathisia:  NA  Handed:  Right  AIMS (if indicated):     Assets:  Communication Skills Desire for Improvement Social Support Transportation  Sleep:  Number of Hours: 6.5   Current Medications: Current Facility-Administered Medications  Medication Dose Route Frequency Provider Last Rate Last Dose  . acetaminophen (TYLENOL) tablet 650 mg  650 mg Oral Q6H PRN Court Joy, PA-C   650 mg at 05/31/13 0748  . alum & mag hydroxide-simeth (MAALOX/MYLANTA) 200-200-20 MG/5ML suspension 30 mL  30 mL Oral Q4H PRN Court Joy, PA-C      . DULoxetine (CYMBALTA) DR capsule 30 mg  30 mg Oral Daily Nehemiah Settle, MD   30 mg at 05/31/13 0749  . gabapentin (NEURONTIN) tablet 600 mg  600 mg Oral TID Nehemiah Settle, MD   600 mg at 05/31/13 1250  . hydrOXYzine (ATARAX/VISTARIL) tablet 50 mg  50 mg Oral QHS PRN Nehemiah Settle, MD   50 mg at 05/30/13 2133  . magnesium hydroxide (MILK OF MAGNESIA) suspension 30 mL  30 mL Oral Daily PRN Court Joy, PA-C        Lab Results:  Results for orders placed during the hospital encounter of 05/29/13 (from the past 48 hour(s))  URINE RAPID DRUG SCREEN (HOSP PERFORMED)     Status: None   Collection Time    05/29/13  4:03 PM      Result Value Range   Opiates NONE DETECTED  NONE DETECTED   Cocaine NONE DETECTED  NONE DETECTED   Benzodiazepines NONE DETECTED  NONE DETECTED   Amphetamines NONE DETECTED  NONE DETECTED   Tetrahydrocannabinol NONE DETECTED  NONE DETECTED   Barbiturates NONE DETECTED  NONE DETECTED   Comment:            DRUG SCREEN FOR MEDICAL PURPOSES     ONLY.  IF CONFIRMATION IS NEEDED     FOR ANY PURPOSE, NOTIFY LAB     WITHIN 5 DAYS.                LOWEST DETECTABLE LIMITS     FOR URINE DRUG SCREEN     Drug Class       Cutoff (ng/mL)     Amphetamine      1000     Barbiturate      200     Benzodiazepine   200     Tricyclics       300     Opiates           300     Cocaine          300     THC              50  PREGNANCY, URINE     Status: None   Collection Time    05/29/13  4:03 PM      Result Value Range   Preg Test, Ur NEGATIVE  NEGATIVE   Comment:            THE SENSITIVITY OF THIS     METHODOLOGY IS >20 mIU/mL.  CBC WITH DIFFERENTIAL     Status: Abnormal   Collection Time    05/29/13  4:17 PM      Result Value Range   WBC 8.5  4.0 - 10.5 K/uL   RBC 4.47  3.87 - 5.11 MIL/uL   Hemoglobin 13.1  12.0 - 15.0 g/dL   HCT 78.2  95.6 - 21.3 %   MCV 85.9  78.0 - 100.0 fL   MCH 29.3  26.0 - 34.0 pg   MCHC 34.1  30.0 - 36.0 g/dL   RDW 08.6  57.8 - 46.9 %   Platelets 287  150 - 400 K/uL   Neutrophils Relative % 41 (*) 43 - 77 %   Neutro Abs 3.5  1.7 - 7.7 K/uL   Lymphocytes Relative 48 (*) 12 - 46 %   Lymphs Abs 4.1 (*) 0.7 - 4.0 K/uL   Monocytes Relative 9  3 - 12 %   Monocytes Absolute 0.8  0.1 - 1.0 K/uL   Eosinophils Relative 2  0 - 5 %   Eosinophils Absolute 0.2  0.0 - 0.7 K/uL   Basophils Relative 0  0 - 1 %   Basophils Absolute 0.0  0.0 - 0.1 K/uL  COMPREHENSIVE METABOLIC PANEL     Status: Abnormal   Collection Time    05/29/13  4:17 PM      Result Value Range   Sodium 138  135 - 145 mEq/L   Potassium 3.6  3.5 - 5.1 mEq/L   Chloride 105  96 - 112 mEq/L   CO2 24  19 - 32 mEq/L   Glucose, Bld 108 (*) 70 - 99 mg/dL   BUN 4 (*) 6 - 23 mg/dL   Creatinine, Ser 6.29  0.50 - 1.10 mg/dL   Calcium 9.6  8.4 - 52.8 mg/dL   Total Protein 7.8  6.0 - 8.3 g/dL   Albumin 3.8  3.5 - 5.2 g/dL  AST 23  0 - 37 U/L   ALT 33  0 - 35 U/L   Alkaline Phosphatase 93  39 - 117 U/L   Total Bilirubin 0.4  0.3 - 1.2 mg/dL   GFR calc non Af Amer >90  >90 mL/min   GFR calc Af Amer >90  >90 mL/min   Comment: (NOTE)     The eGFR has been calculated using the CKD EPI equation.     This calculation has not been validated in all clinical situations.     eGFR's persistently <90 mL/min signify possible Chronic Kidney     Disease.  ETHANOL      Status: None   Collection Time    05/29/13  4:17 PM      Result Value Range   Alcohol, Ethyl (B) <11  0 - 11 mg/dL   Comment:            LOWEST DETECTABLE LIMIT FOR     SERUM ALCOHOL IS 11 mg/dL     FOR MEDICAL PURPOSES ONLY    Physical Findings: AIMS: Facial and Oral Movements Muscles of Facial Expression: None, normal Lips and Perioral Area: None, normal Jaw: None, normal Tongue: None, normal,Extremity Movements Upper (arms, wrists, hands, fingers): None, normal Lower (legs, knees, ankles, toes): None, normal, Trunk Movements Neck, shoulders, hips: None, normal, Overall Severity Severity of abnormal movements (highest score from questions above): None, normal Incapacitation due to abnormal movements: None, normal Patient's awareness of abnormal movements (rate only patient's report): No Awareness, Dental Status Current problems with teeth and/or dentures?: No Does patient usually wear dentures?: No  CIWA:    COWS:     Treatment Plan Summary: Daily contact with patient to assess and evaluate symptoms and progress in treatment Medication management  Plan: Continue current medications and treatment plan. Treatment Plan/Recommendations:   1. Admit for crisis management and stabilization. 2. Medication management to reduce current symptoms to base line and improve the patient's overall level of functioning. 3. Treat health problems as indicated. 4. Develop treatment plan to decrease risk of relapse upon discharge and to reduce the need for readmission. 5. Psycho-social education regarding relapse prevention and self care. 6. Health care follow up as needed for medical problems. 7. Restart home medications where appropriate. 8. Disposition plans are in progress and may discharge on Wednesday if continue to progress and contracts for safety.  Medical Decision Making Problem Points:  Established problem, worsening (2), Review of last therapy session (1) and Review of  psycho-social stressors (1) Data Points:  Review or order clinical lab tests (1) Review or order medicine tests (1) Review of medication regiment & side effects (2) Review of new medications or change in dosage (2)  I certify that inpatient services furnished can reasonably be expected to improve the patient's condition.   Carmeline Kowal,JANARDHAHA R. 05/31/2013, 3:39 PM

## 2013-05-31 NOTE — Progress Notes (Signed)
Patient ID: [name], female   DOB: Mar 07, 1981, 33 y.o.   MRN: [id number] D)  Has been resting quietly tonight without c/o's, eyes closed, resp reg, unlabored. A)  Will continue to monotor for safety, continue POC R)  Safety maintained.
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
## 2013-05-31 NOTE — [facility] Group Notes (Signed)
[facility] Group Notes:  (Clinical Social Work)  05/31/2013   3:00-4:00PM  Summary of Progress/Problems:   The main focus of today's process group was to   identify the patient's current support system and decide on other supports that can be put in place.  The picture on workbook was used to discuss why additional supports are needed, and a hand-out was distributed with four definitions/levels of support, then used to talk about how patients have given and received all different kinds of support.  An emphasis was placed on using counselor, doctor, therapy groups, 12-step groups, and problem-specific support groups to expand supports.  The patient expressed a willingness to add supports, shared with the group what she has done that has worked.  She talked of adding school to her life, as she wants to be a Investment banker, operational or a masseuse.  Type of Therapy:  Process Group  Participation Level:  Active  Participation Quality:  Attentive and Sharing  Affect:  Blunted  Cognitive:  Appropriate and Oriented  Insight:  Engaged  Engagement in Therapy:  Engaged  Modes of Intervention:  Education,  Support and [employer], LCSW 05/31/2013, 2:58 PM

## 2013-06-01 NOTE — Progress Notes (Signed)
Patient ID: [name], female   DOB: [birth year], 32 y.o.   MRN: [id number] D- Patient says her appetite is good and her energy level is normal.  She rates her depression at 2/10 and hopelessness at 10/10.  She denies thoughts of self harm.  She is using he walker to ambulate.  She rates her pain at 10 when unmedicated and says that it goes down to a 6/10.  A- supported patient.  R- Patient says she wants to take care of her family and she is attending groups here.
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
## 2013-06-01 NOTE — Progress Notes (Signed)
Adult Psychoeducational Group Note  Date:  06/01/2013 Time:  11:09 PM  Group Topic/Focus:  Goals Group:   The focus of this group is to help patients establish daily goals to achieve during treatment and discuss how the patient can incorporate goal setting into their daily lives to aide in recovery.  Participation Level:  Active  Participation Quality:  Appropriate  Affect:  Appropriate  Cognitive:  Appropriate  Insight: Appropriate  Engagement in Group:  Engaged  Modes of Intervention:  Discussion  Additional Comments:  Pt stated the groups and the interactions with other made the day.   Terie Purser R 06/01/2013, 11:09 PM

## 2013-06-01 NOTE — Progress Notes (Signed)
Patient ID: [name], female   DOB: 1980/11/12, 32 y.o.   MRN: [id number] D)  Was out and about on the hall this evening, walking with walker, has been pleasant and cooperative, compliant with meds, attended group.  Did c/o some pain in her back, was given tylenol for discomfort and heat packs.  Stated has had a good day, enjoyed being able to visit with her kids. A)  Will continue to offer support and encouragement, continue POC,  R)  Safety maintained.

## 2013-06-01 NOTE — Tx Team (Signed)
Interdisciplinary Treatment Plan Update   Date Reviewed:  06/01/2013  Time Reviewed:  10:04 AM  Progress in Treatment:   Attending groups: Yes Participating in groups: Yes Taking medication as prescribed: Yes  Tolerating medication: Yes Family/Significant other contact made: No, but will ask patient for consent for collateral contact Patient understands diagnosis: Yes  Discussing patient identified problems/goals with staff: Yes Medical problems stabilized or resolved: Yes Denies suicidal/homicidal ideation: Yes Patient has not harmed self or others: Yes  For review of initial/current patient goals, please see plan of care.  Estimated Length of Stay:  2-3 days  Reasons for Continued Hospitalization:  Anxiety Depression Medication stabilization  New Problems/Goals identified:    Discharge Plan or Barriers:   Home with outpatient follow up to be determined  Additional Comments:  [name] is a 32 y.o. Married Hispanic female admitted voluntarily and emergently from [facility] for depression with suicidal ideation and s/p attempted overdose of pain medication about two days ago. She vomited the pills after her daughter saw her take them. She has another suicide attempt about 2 Years ago, she attempted the same and in 1998 or 2000   Attendees:  Patient:  06/01/2013 10:04 AM   Signature:  06/01/2013 10:04 AM  Signature:  Verne Spurr, PA 06/01/2013 10:04 AM  Signature:  06/01/2013 10:04 AM  Signature:Beverly Terrilee Croak, RN 06/01/2013 10:04 AM  Signature:  Neill Loft RN 06/01/2013 10:04 AM  Signature:  Juline Patch, LCSW 06/01/2013 10:04 AM  Signature:  06/01/2013 10:04 AM  Signature:  Maseta Dorley,Care Coordinator 06/01/2013 10:04 AM  Signature:  06/01/2013 10:04 AM  Signature:  06/01/2013  10:04 AM  Signature:    Signature:      Scribe for Treatment Team:   Juline Patch,  06/01/2013 10:04 AM

## 2013-06-01 NOTE — Progress Notes (Signed)
Adult Psychoeducational Group Note  Date:  06/01/2013 Time:  11:56 AM  Group Topic/Focus:  Developing a Wellness Toolbox:   The focus of this group is to help patients develop a "wellness toolbox" with skills and strategies to promote recovery upon discharge.  Participation Level:  Active  Participation Quality:  Attentive  Affect:  Appropriate  Cognitive:  Appropriate  Insight: Good  Engagement in Group:  Engaged  Modes of Intervention:  Discussion  Additional Comments:    [name] 06/01/2013, 11:56 AM

## 2013-06-01 NOTE — Progress Notes (Signed)
Recreation Therapy Notes  Date: 09.22.2014 Time: 3:00pm Location: [address] Dayroom   Group Topic: Wellness  Goal Area(s) Addresses:  Patient will define components of whole wellness. Patient will verbalize benefit of whole wellness.  Behavioral Response: Attentive, Appropriate  Intervention: Informational Worksheet  Activity: 6 Dimensions of Health. Patients were asked to identify at least 3 ways they are personally addressing the 6 dimensions of health: Physical, Emotional, Spiritual, Social, Environmental and Intellectual.   Education: Discharge Planning, Coping Skills  Education Outcome: Acknowledges understanding  Clinical Observations/Feedback: Patient made no contributions to opening discussion, however she appeared to actively listen, as she maintained appropriate eye contact with speaker. Patient complete worksheet as instruction, sharing that she does not devote enough time to her intellectual and environmental health. Patient made no additional contributions to wrap up discussion, but appeared to actively listen as she maintained appropriate eye contact with speaker, as well as nodded her head in agreement with points of interest.   Jearl Klinefelter, LRT/CTRS  Jearl Klinefelter 06/01/2013 4:28 PM

## 2013-06-01 NOTE — [facility] Group Notes (Signed)
[facility] LCSW Group Therapy          Overcoming Obstacles       1:15 -2:30        06/01/2013   3:29 PM     Type of Therapy:  Group Therapy  Participation Level:  Appropriate  Participation Quality:  Appropriate  Affect:  Appropriate, Alert  Cognitive:  Attentive Appropriate  Insight: Developing/Improving Engaged  Engagement in Therapy: Developing/Imprvoing Engaged  Modes of Intervention:  Discussion Exploration  Education Rapport BuildingProblem-Solving Support  Summary of Progress/Problems:  The main focus of today's group was overcoming  Obstacles.  She shared her family is the obstacle she needs to overcome.  She advised they are not supportive and do not respond to her phone calls.  Patient stated she has a few friends.  She was advised of services offered through [facility].  Patient asked for and was given contact information.   Wynn Banker 06/01/2013    3:29 PM

## 2013-06-02 NOTE — Progress Notes (Signed)
Adult Psychoeducational Group Note  Date:  06/02/2013 Time:  10:57 PM  Group Topic/Focus:  Goals Group:   The focus of this group is to help patients establish daily goals to achieve during treatment and discuss how the patient can incorporate goal setting into their daily lives to aide in recovery.  Participation Level:  Active  Participation Quality:  Appropriate  Affect:  Appropriate  Cognitive:  Appropriate  Insight: Appropriate  Engagement in Group:  Engaged  Modes of Intervention:  Discussion  Additional Comments:  Pt stated that she is excited to go home tomorrow and she look forward to cooking for her family that what made her day.  Aldona Lento 06/02/2013, 10:57 PM

## 2013-06-02 NOTE — Progress Notes (Signed)
Patient ID: [name], female   DOB: 05/01/1981, 32 y.o.   MRN: [id number] D- Patient reports she slept fair and her appetite is good.  Her energy level  Is low and her ability to pay attention is poor.  She is attending groups and endorses suicidal thoughts off and on. A- Asked patient to contract for safety and she does.   She says she is learning new coping skills and she is supportive of peers.  Her faith life is one of her strengths.  She talked about how hard it is to stand up to people who are frightening to you.
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
## 2013-06-02 NOTE — Progress Notes (Signed)
The focus of this group is to educate the patient on the purpose and policies of crisis stabilization and provide a format to answer questions about their admission.  The group details unit policies and expectations of patients while admitted.  Patient attended 0900 nurse education orientation group.  Patient actively participated, appropriate affect, alert, appropriate insight and engagement.  Patient will work on goals for discharge today.  

## 2013-06-02 NOTE — Progress Notes (Signed)
Recreation Therapy Notes  Date: 09.23.2014 Time: 2:45pm Location: 500 [address] Dayroom  Group Topic: Software engineer Activities (AAA)  Behavioral Response: Appropriate, Engaged  Affect: Euthymic to bright at times  Clinical Observations/Feedback: Dog Team: [employer]. Patient interacted appropriately with peer, dog team, LRT and MHT. Patient was observed to smile and laugh with peers during session.   Marykay Lex Lio Wehrly, LRT/CTRS  Jearl Klinefelter 06/02/2013 8:56 PM

## 2013-06-02 NOTE — [facility] Group Notes (Signed)
[facility] LCSW Group Therapy  06/02/2013  1:15 PM   Type of Therapy:  Group Therapy  Participation Level:  Active  Participation Quality:  Appropriate and Attentive  Affect:  Appropriate and Bright  Cognitive:  Alert and Appropriate  Insight:  Developing/Improving and Engaged  Engagement in Therapy:  Developing/Improving and Engaged  Modes of Intervention:  Clarification, Confrontation, Discussion, Education, Exploration, Limit-setting, Orientation, Problem-solving, Rapport Building, Dance movement psychotherapist, Socialization and Support  Summary of Progress/Problems: The topic for group therapy was feelings about diagnosis.  Pt actively participated in group discussion on their past and current diagnosis and how they feel towards this.  Pt also identified how society and family members judge them, based on their diagnosis as well as stereotypes and stigmas.   Pt shared that prior to coming to the hospital she had a "death plan" and felt she didn't have any control, in regards to her diagnosis.  Pt states that today she feels valuable and "not trash".  Pt states that she has goals now to go to school, love herself and talk about what is bothering her.  Pt states that she has been able to let go of things that were holding her back from getting well.  Pt appeared to have good insight into her recovery and was supportive to peers.  Pt actively participated and was engaged in group discussion.    [name], [place] 06/02/2013 2:32 PM

## 2013-06-02 NOTE — Progress Notes (Signed)
D: Patient resting in bed with eyes closed.  Respirations even and unlabored.  Patient appears to be in no apparent distress. A: Staff to monitor Q 15 mins for safety.   R:Patient remains safe on the unit.  

## 2013-06-02 NOTE — Progress Notes (Signed)
Pt attended grief and loss group facilitated by chaplain Nayra Coury.  Group members discussed themes in grief process, identifying loss both in relationships and loss in relation to self (isolation, guilt, fear, anger, sadness). Discussed how their family history and social expectations form how they feel they have carried grief historically and how they feel they "should" act in grief. Connected education with experiences of loss and shared with one another about present losses in life. Group provided empathic support and normalized variety of grief process among members. Group closed by reflecting on what is helpful for members in journeying through grief.   

## 2013-06-02 NOTE — Progress Notes (Signed)
Adult Psychoeducational Group Note  Date:  06/02/2013 Time:  11:00am Group Topic/Focus:  Recovery Goals:   The focus of this group is to identify appropriate goals for recovery and establish a plan to achieve them.  Participation Level:  Active  Participation Quality:  Appropriate and Attentive  Affect:  Appropriate  Cognitive:  Alert and Appropriate  Insight: Appropriate  Engagement in Group:  Engaged  Modes of Intervention:  Discussion and Education  Additional Comments:  Pt attended and participated in group. When ask what recovery means to her pt stated to be positive and do things that make me happy. When ask what she could do to help herself pt stated talk more and she loves to cook and give massages.  Shelly Bombard D 06/02/2013, 1:54 PM

## 2013-06-03 MED ORDER — DULOXETINE HCL 30 MG PO CPEP: 30.0000 mg | ORAL_CAPSULE | Freq: Every day | ORAL | Status: DC

## 2013-06-03 MED ORDER — GABAPENTIN 600 MG PO TABS: 600.0000 mg | ORAL_TABLET | Freq: Three times a day (TID) | ORAL | Status: DC

## 2013-06-03 NOTE — Progress Notes (Signed)
[facility] Adult Case Management Discharge Plan :  Will you be returning to the same living situation after discharge: Yes,  home with husband At discharge, do you have transportation home?:Yes,  husband Do you have the ability to pay for your medications:Yes,  mental health  Release of information consent forms completed and in the chart;  Patient's signature needed at discharge.  Patient to Follow up at: Follow-up Information   Follow up with [facility] [place]. (Walk in Monday through Friday between 8AM-12PM for hospital followup. )    Contact information:   [address], [place] [address] Phone: [phone] Fax: [phone]      Patient denies SI/HI:   Yes,  during self report/group    Safety Planning and Suicide Prevention discussed:  Yes,  Contact attempts made with pt's significant other. [employer] pamphlet provided to pt and she was encouraged to ask questions, talk about concens, and share information with her support network.  Smart, [name] 06/03/2013, 11:49 AM

## 2013-06-03 NOTE — [facility] Group Notes (Signed)
[facility] LCSW Group Therapy  06/03/2013  1:15 PM   Type of Therapy:  Group Therapy  Participation Level:  Active  Participation Quality:  Appropriate and Attentive  Affect:  Appropriate and Calm  Cognitive:  Alert and Appropriate  Insight:  Developing/Improving and Engaged  Engagement in Therapy:  Developing/Improving and Engaged  Modes of Intervention:  Clarification, Confrontation, Discussion, Education, Exploration, Limit-setting, Orientation, Problem-solving, Rapport Building, Dance movement psychotherapist, Socialization and Support  Summary of Progress/Problems: The topic for group today was emotional regulation.  This group focused on both positive and negative emotion identification and allowed group members to process ways to identify feelings, regulate negative emotions, and find healthy ways to manage internal/external emotions. Group members were asked to reflect on a time when their reaction to an emotion led to a negative outcome and explored how alternative responses using emotion regulation would have benefited them. Group members were also asked to discuss a time when emotion regulation was utilized when a negative emotion was experienced.  Pt didn't share personally but participated in the group discussion and was supportive to peers.    Reyes Ivan, [place] 06/03/2013 2:48 PM

## 2013-06-03 NOTE — Discharge Summary (Signed)
Physician Discharge Summary Note  Patient:  [name] is an 32 y.o., female MRN:  [id number] DOB:  Jan 12, 1981 Patient phone:  [phone] (home)  Patient address:   [address] [place] [address],   Date of Admission:  05/30/2013 Date of Discharge: 06/03/2013  Reason for Admission:  Suicide attempt  Discharge Diagnoses: Active Problems:   PTSD (post-traumatic stress disorder)   Depression with suicidal ideation   Suicide attempt by multiple drug overdose  ROS  DSM5:Schizophrenia Disorders:  Obsessive-Compulsive Disorders: Trauma-Stressor Disorders: Posttraumatic Stress Disorder (309.81)  Substance/Addictive Disorders:  Depressive Disorders: Major Depressive Disorder - Severe (296.23)  AXIS I: Major Depression, Recurrent severe and Post Traumatic Stress Disorder  AXIS II: Deferred  AXIS III:  Past Medical History   Diagnosis  Date   .  Neck pain    .  Migraines    .  Depression    .  Anxiety      relative to circumstances to back problems/injury, not taking any medicine, pt. reports has been crying a lot   .  Asthma    .  GERD (gastroesophageal reflux disease)    .  Arthritis      low back    AXIS IV: economic problems, occupational problems, other psychosocial or environmental problems, problems related to social environment and problems with primary support group  AXIS V: 41-50 serious symptoms   Level of Care:  OP  Hospital Course:  [name] is a 32 y.o. Married Hispanic female admitted voluntarily and emergently from [facility] for depression with suicidal ideation and s/p attempted overdose of pain medication.              [name] was oriented to the unit and encouraged to participate in unit programming. Medical problems were identified and treated appropriately. Home medication was restarted as needed. Psychiatric medication management was initiated.         [name] was evaluated each day by a clinical provider to ascertain the patient's  response to treatment.  Improvement was noted by the patient's report of decreasing symptoms, improved sleep and appetite, affect, medication tolerance, behavior, and participation in unit programming.  [name] was asked each day to complete a self inventory noting mood, mental status, pain, new symptoms, anxiety and concerns.         She responded well to medication and being in a therapeutic and supportive environment. Positive and appropriate behavior was noted and the patient was motivated for recovery.  [name] worked closely with the treatment team and case manager to develop a discharge plan with appropriate goals. Coping skills, problem solving as well as relaxation therapies were also part of the unit programming.         By the day of discharge [name] was in much improved condition than upon admission.  Symptoms were reported as significantly decreased or resolved completely.  The patient denied SI/HI and voiced no AVH. She was motivated to continue taking medication with a goal of continued improvement in mental health.          [name] was discharged home with a plan to follow up as noted below.     Consults:  None  Significant Diagnostic Studies:  None  Discharge Vitals:   Blood pressure 108/74, pulse 93, temperature 98.3 F (36.8 C), temperature source Oral, resp. rate 18, height 5' (1.524 m), weight 84.369 kg (186 lb). Body mass index is 36.33 kg/(m^2). Lab Results:   No results found for this or  any previous visit (from the past 72 hour(s)).  Physical Findings: AIMS: Facial and Oral Movements Muscles of Facial Expression: None, normal Lips and Perioral Area: None, normal Jaw: None, normal Tongue: None, normal,Extremity Movements Upper (arms, wrists, hands, fingers): None, normal Lower (legs, knees, ankles, toes): None, normal, Trunk Movements Neck, shoulders, hips: None, normal, Overall Severity Severity of abnormal movements (highest score from questions above):  None, normal Incapacitation due to abnormal movements: None, normal Patient's awareness of abnormal movements (rate only patient's report): No Awareness, Dental Status Current problems with teeth and/or dentures?: No Does patient usually wear dentures?: No  CIWA:    COWS:     Psychiatric Specialty Exam: See Psychiatric Specialty Exam and Suicide Risk Assessment completed by Attending Physician prior to discharge.  Discharge destination:  Home  Is patient on multiple antipsychotic therapies at discharge:  No   Has Patient had three or more failed trials of antipsychotic monotherapy by history:  No  Recommended Plan for Multiple Antipsychotic Therapies: NA  Discharge Orders   Future Orders Complete By Expires   Diet - low sodium heart healthy  As directed    Discharge instructions  As directed    Comments:     Take all of your medications as directed. Be sure to keep all of your follow up appointments.  If you are unable to keep your follow up appointment, call your Doctor's office to let them know, and reschedule.  Make sure that you have enough medication to last until your appointment. Be sure to get plenty of rest. Going to bed at the same time each night will help. Try to avoid sleeping during the day.  Increase your activity as tolerated. Regular exercise will help you to sleep better and improve your mental health. Eating a heart healthy diet is recommended. Try to avoid salty or fried foods. Be sure to avoid all alcohol and illegal drugs.   Increase activity slowly  As directed        Medication List    STOP taking these medications       acetaminophen 325 MG tablet  Commonly known as:  TYLENOL     chlorpheniramine 4 MG tablet  Commonly known as:  CHLOR-TRIMETON     gabapentin 300 MG capsule  Commonly known as:  NEURONTIN  Replaced by:  gabapentin 600 MG tablet     TYLENOL COLD/FLU SEVERE DAY PO      TAKE these medications     Indication   albuterol 108 (90  BASE) MCG/ACT inhaler  Commonly known as:  PROVENTIL HFA;VENTOLIN HFA  Inhale 2 puffs into the lungs every 4 (four) hours as needed for wheezing.      calcium carbonate 750 MG chewable tablet  Commonly known as:  TUMS EX  Chew 1 tablet by mouth 2 (two) times daily as needed for heartburn.      DULoxetine 30 MG capsule  Commonly known as:  CYMBALTA  Take 1 capsule (30 mg total) by mouth daily. For depression and anxiety and pain.   Indication:  Generalized Anxiety Disorder, Major Depressive Disorder, Neuropathic Pain     gabapentin 600 MG tablet  Commonly known as:  NEURONTIN  Take 1 tablet (600 mg total) by mouth 3 (three) times daily. For anxiety, neuropathic pain and depression.   Indication:  Agitation, Neuropathic Pain, Pain           Follow-up Information   Follow up with [facility]. (Walk in Monday through Friday  between 8AM-12PM for hospital followup. )    Contact information:   [address], [place] [address] Phone: [phone] Fax: [phone]      Follow-up recommendations:  Activities: Resume activity as tolerated. Diet: Heart healthy low sodium diet Tests: Follow up testing will be determined by your out patient provider.  Comments:  Total Discharge Time:  Greater than 30 minutes.  Signed: Rona Ravens. [facility]  06/03/2013 2:40  Patient was evaluated approximately, developed treatment plan, case discussed with the treatment team. Reviewed the information documented and agree with the discharge treatment plan.  Evyn Putzier,JANARDHAHA R. 06/08/2013 2:43 PM

## 2013-06-03 NOTE — Progress Notes (Signed)
Pt discharged per MD orders; pt currently denies SI/HI and auditory/visual hallucinations; pt was given education by RN regarding follow-up appointments and medications and pt denied any questions or concerns about these instructions; pt was then escorted to search room to retrieve her belongings by RN before being discharged to hospital lobby. 

## 2013-06-03 NOTE — [facility] Group Notes (Signed)
[facility] LCSW Aftercare Discharge Planning Group Note   06/03/2013 8:45 AM  Participation Quality:  Alert and Appropriate   Mood/Affect:  Appropriate and Calm  Depression Rating:  2  Anxiety Rating:  2  Thoughts of Suicide:  Pt denies SI/HI  Will you contract for safety?   Yes  Current AVH:  Pt denies  Plan for Discharge/Comments:  Pt attended discharge planning group and actively participated in group.  CSW provided pt with today's workbook.  Pt states that she feels stable to d/c today.  Pt states that she plans to take care of herself and family.  Pt will return home in [place] and follow up at [facility] [place] for medication management and therapy.  No further needs voiced by pt at this time.    Transportation Means: Pt reports access to transportation - husband will pick pt up  Supports: No supports mentioned at this time  Reyes Ivan, LCSWA 06/03/2013 10:24 AM

## 2013-06-03 NOTE — [facility] Suicide Risk Assessment (Signed)
Suicide Risk Assessment  Discharge Assessment     Demographic Factors:  Adolescent or young adult, Low socioeconomic status, Unemployed and Disabled  Mental Status Per Nursing Assessment::   On Admission:  Self-harm thoughts  Current Mental Status by Physician: Mental Status Examination: Patient appeared as per his stated age, casually dressed, and fairly groomed, and maintaining good eye contact. Patient has good mood and his affect was constricted. He has normal rate, rhythm, and volume of speech. His thought process is linear and goal directed. Patient has denied suicidal, homicidal ideations, intentions or plans. Patient has no evidence of auditory or visual hallucinations, delusions, and paranoia. Patient has fair insight judgment and impulse control.  Loss Factors: Decrease in vocational status, Decline in physical health and Financial problems/change in socioeconomic status  Historical Factors: Prior suicide attempts and Impulsivity  Risk Reduction Factors:   Sense of responsibility to family, Religious beliefs about death, Living with another person, especially a relative, Positive social support, Positive therapeutic relationship and Positive coping skills or problem solving skills  Continued Clinical Symptoms:  Depression:   Recent sense of peace/wellbeing Previous Psychiatric Diagnoses and Treatments Medical Diagnoses and Treatments/Surgeries  Cognitive Features That Contribute To Risk:  Polarized thinking    Suicide Risk:  Minimal: No identifiable suicidal ideation.  Patients presenting with no risk factors but with morbid ruminations; may be classified as minimal risk based on the severity of the depressive symptoms  Discharge Diagnoses:   AXIS I:  Major Depression, Recurrent severe and Post Traumatic Stress Disorder AXIS II:  Deferred AXIS III:   Past Medical History  Diagnosis Date  . Neck pain   . Migraines   . Depression   . Anxiety     relative to  circumstances to back problems/injury, not taking any medicine, pt. reports has been crying a lot  . Asthma   . GERD (gastroesophageal reflux disease)   . Arthritis     low back   AXIS IV:  economic problems, occupational problems, other psychosocial or environmental problems and problems related to social environment AXIS V:  61-70 mild symptoms  Plan Of Care/Follow-up recommendations:  Activity:  As tolerated Diet:  Regular  Is patient on multiple antipsychotic therapies at discharge:  No   Has Patient had three or more failed trials of antipsychotic monotherapy by history:  No  Recommended Plan for Multiple Antipsychotic Therapies: NA  Nehemiah Settle., M.D. 06/03/2013, 12:48 PM

## 2013-06-03 NOTE — Progress Notes (Signed)
Adult Psychoeducational Group Note  Date:  06/03/2013 Time:  10:00 11:00am Group Topic/Focus:  Personal Choices and Values:   The focus of this group is to help patients assess and explore the importance of values in their lives, how their values affect their decisions, how they express their values and what opposes their expression.  Participation Level:  Active  Participation Quality:  Appropriate and Attentive  Affect:  Appropriate  Cognitive:  Alert and Appropriate  Insight: Appropriate  Engagement in Group:  Engaged  Modes of Intervention:  Discussion and Education  Additional Comments:  Pt attended and participated in group. We discussed personal development and loving yourself. Pt stated she wants to be more positive say away from people that do not love me. Pt states she wants to go to school and she feels she is beautiful helpful and she has learned how to love herself since she has been here at [facility].  Shelly Bombard D 06/03/2013, 12:01 PM

## 2013-06-03 NOTE — B[facility] Suicide Risk Assessment (Signed)
[facility] INPATIENT:  Family/Significant Other Suicide Prevention Education  Suicide Prevention Education:  Contact Attempts: [name] (pt's significant other) has been identified by the patient as the family member/significant other with whom the patient will be residing, and identified as the person(s) who will aid the patient in the event of a mental health crisis.  With written consent from the patient, two attempts were made to provide suicide prevention education, prior to and/or following the patient's discharge.  We were unsuccessful in providing suicide prevention education.  A suicide education pamphlet was given to the patient to share with family/significant other.  Date and time of first attempt: 10:30AM  Date and time of second attempt: 11:45AM unable to leave voicemail.   Brandt, Patricia Erker 06/03/2013, 11:48 AM

## 2013-06-08 NOTE — Progress Notes (Signed)
Patient Discharge Instructions:  After Visit Summary (AVS):   Faxed to:  06/08/13 Discharge Summary Note:   Faxed to:  06/08/13 Psychiatric Admission Assessment Note:   Faxed to:  06/08/13 Suicide Risk Assessment - Discharge Assessment:   Faxed to:  06/08/13 Faxed/Sent to the Next Level Care provider:  06/08/13 Faxed to [facility] @ [phone]  Jerelene Redden, 06/08/2013, 3:00 PM

## 2014-01-08 ENCOUNTER — Ambulatory Visit: Payer: Self-pay

## 2014-02-19 ENCOUNTER — Ambulatory Visit: Payer: Self-pay

## 2014-05-23 ENCOUNTER — Encounter (HOSPITAL_COMMUNITY): Payer: Self-pay | Admitting: Emergency Medicine

## 2014-05-23 ENCOUNTER — Emergency Department (HOSPITAL_COMMUNITY)
Admission: EM | Admit: 2014-05-23 | Discharge: 2014-05-24 | Disposition: A | Discharge status: Home / Self Care | Payer: Self-pay | Attending: Emergency Medicine | Admitting: Emergency Medicine

## 2014-05-23 DIAGNOSIS — G43909 Migraine, unspecified, not intractable, without status migrainosus: Secondary | ICD-10-CM | POA: Insufficient documentation

## 2014-05-23 DIAGNOSIS — Z8739 Personal history of other diseases of the musculoskeletal system and connective tissue: Secondary | ICD-10-CM | POA: Insufficient documentation

## 2014-05-23 DIAGNOSIS — J45909 Unspecified asthma, uncomplicated: Secondary | ICD-10-CM | POA: Insufficient documentation

## 2014-05-23 DIAGNOSIS — F3289 Other specified depressive episodes: Secondary | ICD-10-CM | POA: Insufficient documentation

## 2014-05-23 DIAGNOSIS — K219 Gastro-esophageal reflux disease without esophagitis: Secondary | ICD-10-CM | POA: Insufficient documentation

## 2014-05-23 DIAGNOSIS — F329 Major depressive disorder, single episode, unspecified: Secondary | ICD-10-CM | POA: Insufficient documentation

## 2014-05-23 DIAGNOSIS — F411 Generalized anxiety disorder: Secondary | ICD-10-CM | POA: Insufficient documentation

## 2014-05-23 DIAGNOSIS — J45901 Unspecified asthma with (acute) exacerbation: Secondary | ICD-10-CM | POA: Insufficient documentation

## 2014-05-23 DIAGNOSIS — Z79899 Other long term (current) drug therapy: Secondary | ICD-10-CM | POA: Insufficient documentation

## 2014-05-23 MED ORDER — IPRATROPIUM-ALBUTEROL 0.5-2.5 (3) MG/3ML IN SOLN
3.0000 mL | Freq: Once | RESPIRATORY_TRACT | Status: AC
  Administered 2014-05-23: 3 mL via RESPIRATORY_TRACT
  Filled 2014-05-23: qty 3

## 2014-05-23 MED ORDER — PREDNISONE 20 MG PO TABS
60.0000 mg | ORAL_TABLET | Freq: Once | ORAL | Status: AC
  Administered 2014-05-23: 60 mg via ORAL
  Filled 2014-05-23: qty 3

## 2014-05-23 MED ORDER — ALBUTEROL SULFATE (2.5 MG/3ML) 0.083% IN NEBU
5.0000 mg | INHALATION_SOLUTION | Freq: Once | RESPIRATORY_TRACT | Status: AC
  Administered 2014-05-23: 5 mg via RESPIRATORY_TRACT
  Filled 2014-05-23: qty 6

## 2014-05-23 MED ORDER — IPRATROPIUM BROMIDE 0.02 % IN SOLN
0.5000 mg | Freq: Once | RESPIRATORY_TRACT | Status: AC
  Administered 2014-05-23: 0.5 mg via RESPIRATORY_TRACT
  Filled 2014-05-23: qty 2.5

## 2014-05-23 NOTE — ED Notes (Signed)
C/o sob and wheezing since this morning.  States she doesn't have a Rx for an inhaler.

## 2014-05-23 NOTE — ED Provider Notes (Signed)
CSN: [id number]     Arrival date & time 05/23/14  2227 History   First MD Initiated Contact with Patient 05/23/14 2252     This chart was scribed for non-physician practitioner, Trixie Dredge PA-C working with Elwin Mocha, MD by Arlan Organ, ED Scribe. This patient was seen in room TR10C/TR10C and the patient's care was started at 11:00 PM.   Chief Complaint  Patient presents with  . Asthma   HPI  HPI Comments: [name] is a 33 y.o. female with a PMHx of asthma and GERD who presents to the Emergency Department complaining of an asthma flare up onset this morning. Pt attributes flare up to the weather change. At onset, pt states she was unable to move air. She denies any fever, sore throat, or congestion at this time. However, she admits to a recent cold last week. No recent long distance travel. No leg swelling.  She denies a family or personal history of blood clots. States she has attacks like this once a week.    Past Medical History  Diagnosis Date  . Neck pain   . Migraines   . Depression   . Anxiety     relative to circumstances to back problems/injury, not taking any medicine, pt. reports has been crying a lot  . Asthma   . GERD (gastroesophageal reflux disease)   . Arthritis     low back   Past Surgical History  Procedure Laterality Date  . Lumbar laminectomy/decompression microdiscectomy  06/05/2012    Procedure: LUMBAR LAMINECTOMY/DECOMPRESSION MICRODISCECTOMY;  Surgeon: Emilee Hero, MD;  Location: [facility] OR;  Service: Orthopedics;  Laterality: Left;  Left sided lumbar 4-5 microdisectomy  . Childbirth      x4 vaginal    No family history on file. History  Substance Use Topics  . Smoking status: Never Smoker   . Smokeless tobacco: Never Used  . Alcohol Use: No   OB History   Grav Para Term Preterm Abortions TAB SAB Ect Mult Living                 Review of Systems  Constitutional: Negative for fever and chills.  HENT: Negative for congestion.    Respiratory: Positive for cough and wheezing.   Cardiovascular: Negative for leg swelling.  Gastrointestinal: Negative for abdominal pain.  Musculoskeletal: Negative for back pain.  Allergic/Immunologic: Negative for immunocompromised state.  Hematological: Does not bruise/bleed easily.  All other systems reviewed and are negative.     Allergies  Hydrocodone-acetaminophen  Home Medications   Prior to Admission medications   Medication Sig Start Date End Date Taking? Authorizing Provider  albuterol (PROVENTIL HFA;VENTOLIN HFA) 108 (90 BASE) MCG/ACT inhaler Inhale 2 puffs into the lungs every 4 (four) hours as needed for wheezing.    Historical Provider, MD  calcium carbonate (TUMS EX) 750 MG chewable tablet Chew 1 tablet by mouth 2 (two) times daily as needed for heartburn.    Historical Provider, MD  DULoxetine (CYMBALTA) 30 MG capsule Take 1 capsule (30 mg total) by mouth daily. For depression and anxiety and pain. 06/03/13   Verne Spurr, PA-C  gabapentin (NEURONTIN) 600 MG tablet Take 1 tablet (600 mg total) by mouth 3 (three) times daily. For anxiety, neuropathic pain and depression. 06/03/13   Verne Spurr, PA-C   Triage Vitals: BP 121/102  Pulse 80  Temp(Src) 99.1 F (37.3 C) (Oral)  Resp 30  Ht  (1.626 m)  Wt 200 lb (90.719 kg)  BMI 34.31 kg/m2  SpO2 94%  LMP 04/24/2014   Physical Exam  Nursing note and vitals reviewed. Constitutional: She appears well-developed and well-nourished. No distress.  HENT:  Head: Normocephalic and atraumatic.  Mouth/Throat: Oropharynx is clear and moist.  Neck: Neck supple.  Cardiovascular: Normal rate, regular rhythm and normal heart sounds.   Pulmonary/Chest: Effort normal. No accessory muscle usage. Not tachypneic. No respiratory distress. She has no decreased breath sounds. She has wheezes. She has no rhonchi. She has no rales.  Expiratory wheezes   Musculoskeletal: She exhibits no edema.  Neurological: [name].  Skin:  She is not diaphoretic.  Psychiatric: She has a normal mood and affect. Her behavior is normal. Thought content normal.    ED Course  Procedures (including critical care time)  DIAGNOSTIC STUDIES: Oxygen Saturation is 94% on RA, adequate by my interpretation.    COORDINATION OF CARE: 11:04 PM- Will give breathing treatment. Discussed treatment plan with pt at bedside and pt agreed to plan.    Labs Review Labs Reviewed - No data to display  Imaging Review No results found.   EKG Interpretation None      11:59 PM Patient feeling much better.  States she has an asthma flare at least once a week.  Not on a control medication.  No PCP.   Lungs now CTAB.  Moving air well in all fields.   Filed Vitals:   05/23/14 2349  BP: 132/71  Pulse:   Temp: 98.1 F (36.7 C)  Resp: 16     MDM   Final diagnoses:  Asthma attack    Afebrile, nontoxic patient with asthma attack.  Much improved after 2 breathing treatments and prednisone.  D/C home with albuterol hfa, prednisone, Qvar as patient has frequent exacerbations and is not on a control medication and has no PCP.  Resources given for follow up.  Discussed result, findings, treatment, and follow up  with patient.  Pt given return precautions.  Pt verbalizes understanding and agrees with plan.      I personally performed the services described in this documentation, which was scribed in my presence. The recorded information has been reviewed and is accurate.    [place], PA-C 05/24/14 [phone]

## 2014-05-24 MED ORDER — ALBUTEROL SULFATE HFA 108 (90 BASE) MCG/ACT IN AERS: 1.0000 | INHALATION_SPRAY | Freq: Four times a day (QID) | RESPIRATORY_TRACT | Status: DC | PRN

## 2014-05-24 MED ORDER — ALBUTEROL SULFATE HFA 108 (90 BASE) MCG/ACT IN AERS
2.0000 | INHALATION_SPRAY | Freq: Once | RESPIRATORY_TRACT | Status: AC
  Administered 2014-05-24: 2 via RESPIRATORY_TRACT
  Filled 2014-05-24: qty 6.7

## 2014-05-24 MED ORDER — PREDNISONE (PAK) 10 MG PO TABS: ORAL_TABLET | Freq: Every day | ORAL | Status: DC

## 2014-05-24 MED ORDER — BECLOMETHASONE DIPROPIONATE 40 MCG/ACT IN AERS: 2.0000 | INHALATION_SPRAY | Freq: Two times a day (BID) | RESPIRATORY_TRACT | Status: DC

## 2014-05-24 NOTE — Discharge Instructions (Signed)
Read the information below.  Use the prescribed medication as directed.  Please discuss all new medications with your pharmacist.  You may return to the Emergency Department at any time for worsening condition or any new symptoms that concern you.  If there is any possibility that you might be pregnant, please let your health care provider know and discuss this with the pharmacist to ensure medication safety.  If you develop worsening shortness of breath, uncontrolled wheezing, severe chest pain, or fevers despite using tylenol and/or ibuprofen, return for a recheck.       Asma (Asthma) El asma es una enfermedad recurrente en la que las vas respiratorias se estrechan y [place]. Puede causar dificultad para respirar. Provoca tos, sibilancias y sensacin de falta de aire. Los episodios de asma, tambin llamados crisis de asma, pueden ser leves o potencialmente mortales. El asma no puede curarse, pero los [employer] y los cambios en el estilo de vida lo ayudarn a Scientist, physiological enfermedad. CAUSAS Se cree que la causa del asma son factores hereditarios (genticos) y la exposicin a factores ambientales; sin embargo, su causa exacta se desconoce. El asma generalmente es desencadenada por alrgenos, infecciones en los pulmones o sustancias irritantes que se encuentran en el aire. Los desencadenantes del asma son diferentes para cada persona. Los factores desencadenantes comunes incluyen:   Caspa de los [place].  caros del polvo.  Cucarachas.  El polen de los rboles o el csped.  Moho.  Humo.  Sustancias contaminantes como el polvo, limpiadores del hogar, sprays para el cabello, aerosoles, vapores de pintura, sustancias qumicas fuertes u olores intensos.  El [place] fro, los cambios de Marketing executive y el viento (que aumenta la cantidad de moho y polen en el aire).  Emociones intensas, como llorar o rer Automatic Data.  Estrs.  Ciertos medicamentos (como la aspirina) o algunos frmacos  (como los betabloqueantes).  Los sulfitos que contienen los alimentos y las bebidas. Los alimentos y bebidas que pueden contener sulfitos son las frutas desecadas, las papas fritas y los vinos espumantes.  Enfermedades infecciosas o inflamatorias, como la gripe, el resfro o la inflamacin de las membranas nasales (rinitis).  El reflujo gastroesofgico (ERGE).  Los ejercicios o actividades extenuantes. SNTOMAS Los sntomas pueden ocurrir inmediatamente despus de que se desencadena el asma o muchas horas ms tarde. Los sntomas son:  Sibilancias.  Tos excesiva durante la noche o temprano por la maana.  Tos frecuente o intensa durante un resfro comn.  Opresin en el pecho.  Falta de aire. DIAGNSTICO  El diagnstico se realiza mediante la revisin de su historia clnica y de un examen fsico. Es posible que le indiquen algunos estudios. Estos pueden incluir:  Estudios de la funcin pulmonar. Estas pruebas indican cunto aire inhala y exhala.  Pruebas de alergia.  Estudios de diagnstico por imgenes, como radiografas. TRATAMIENTO  El asma no puede curarse, pero puede controlarse. El Brewing technologist identificar y Automotive engineer los factores desencadenantes del asma. Tambin incluye medicamentos. Hay dos tipos de medicamentos utilizados en el tratamiento para el asma:   Medicamentos de control del asma. Impiden que aparezcan los sntomas. Generalmente se [employer].  Medicamentos de [place] o de rescate. Alivian los sntomas rpidamente. Se utilizan cuando es necesario y proporcionan alivio a Product manager. El mdico lo ayudar a Runner, broadcasting/film/video de accin para el asma, que es una planificacin por escrito para el control y el tratamiento de las crisis de asma. Incluye una lista de los factores desencadenantes y [place]  en que pueden evitarse. Tambin incluye informacin acerca del momento en que se deben USAA y cundo se debe cambiar la dosis. Un plan  de accin tambin incluye el uso de un dispositivo llamado espirmetro. El espirmetro es un dispositivo que mide el funcionamiento de los pulmones. Lo ayuda a controlar la enfermedad. INSTRUCCIONES PARA EL CUIDADO EN EL HOGAR   Tome los medicamentos solamente como se lo haya indicado el mdico. Hable con el mdico si tiene preguntas acerca de cmo o cundo tomar los medicamentos.  Use un espirmetro de acuerdo con las indicaciones del mdico. Anote y Audiological scientist un registro de los [employer].  Conozca y [employer] de accin para ayudar a minimizar o detener una crisis de asma sin necesidad de buscar atencin mdica.  Controle el ambiente de su hogar de la siguiente manera para prevenir las crisis de asma:  No fume. Evite la exposicin al humo de otros fumadores.  Cambie regularmente el filtro de la calefaccin y del aire acondicionado.  Limite el uso de hogares o estufas a lea.  Elimine las plagas (como cucarachas, ratones) y sus excrementos.  Elimine las plantas si observa moho en ellas.  Limpie habitualmente los pisos y el polvo. Utilice productos sin perfume.  Intente que otra persona pase la aspiradora con regularidad. Permanezca fuera de las habitaciones mientras son aspiradas y por algn tiempo despus. Si usted pasa la Jaclyn Prime, use una mscara para polvo de las que se consiguen en la [place], una bolsa de aspiradora de doble capa o microfiltro o una aspiradora con un filtro HEPA.  Reemplace las alfombras por pisos de [place], baldosas o vinilo. Las alfombras pueden retener la caspa de los animales y [place].  Use almohadas, mantas y cubre colchones antialrgicos.  Lave las sbanas y las mantas todas las semanas con agua caliente y squelas con aire caliente.  Use mantas de polister o algodn.  Limpie baos y cocinas con lavandina. Si fuera posible, pdale a alguien que vuelva a pintar las paredes de estas habitaciones con Neomia Dear pintura resistente a los hongos. Aljese de  las habitaciones que se estn limpiando y pintando.  Lvese las manos con frecuencia. SOLICITE ATENCIN MDICA SI:   Respira con dificultad an cuando toma el medicamento para prevenir las crisis.  La mucosidad coloreada que expectora cuando tose (esputo) es ms espesa que lo habitual.  Su esputo cambia de un color claro o blanco a un color amarillo, verde, gris o sanguinolento.  Tiene trastornos ocasionados por el medicamento que est tomando (como urticaria, picazn, hinchazn o dificultades respiratorias).  Necesita un medicamento aliviador ms de 2 o 3 veces por semana.  Su flujo espiratorio mximo se mantiene entre el 50% y el 79% de su Arts administrator personal, despus de seguir el plan de accin durante 1hora.  Tiene fiebre. SOLICITE ATENCIN MDICA DE INMEDIATO SI:   Usted parece empeorar y no responde al tratamiento durante una crisis de asma.  Le falta el aire, incluso en reposo.  Le falta el aire an cuando hace muy poca actividad fsica.  Tiene dificultad para comer, beber o hablar debido a los sntomas del asma.  Siente dolor en el pecho.  Se le acelera la frecuencia cardaca.  Tiene los labios o las uas de tono [place].  Siente que est por desvanecerse, est mareado o se desmaya.  Su flujo mximo es Adult nurse del 50% del Arts administrator personal. ASEGRESE DE QUE:   Comprende estas instrucciones.  Controlar su afeccin.  Recibir  ayuda de inmediato si no mejora o si empeora. Document Released: 08/27/2005 Document Revised: 01/11/2014 [facility] Patient Information 2015 [place], [place]. This information is not intended to replace advice given to you by your health care provider. Make sure you discuss any questions you have with your health care provider.    Emergency Department Resource Guide 1) Find a Doctor and Pay Out of Pocket Although you won't have to find out who is covered by your insurance plan, it is a good idea to ask around and get recommendations.  You will then need to call the office and see if the doctor you have chosen will accept you as a new patient and what types of options they offer for patients who are self-pay. Some doctors offer discounts or will set up payment plans for their patients who do not have insurance, but you will need to ask so you aren't surprised when you get to your appointment.  2) Contact Your Local Health Department Not all health departments have doctors that can see patients for sick visits, but many do, so it is worth a call to see if yours does. If you don't know where your local health department is, you can check in your phone book. The CDC also has a tool to help you locate your state's health department, and many state websites also have listings of all of their local health departments.  3) Find a Walk-in Clinic If your illness is not likely to be very severe or complicated, you may want to try a walk in clinic. These are popping up all over the country in pharmacies, drugstores, and shopping centers. They're usually staffed by nurse practitioners or physician assistants that have been trained to treat common illnesses and complaints. They're usually fairly quick and inexpensive. However, if you have serious medical issues or chronic medical problems, these are probably not your best option.  No Primary Care Doctor: - Call Health Connect at  [phone] - they can help you locate a primary care doctor that  accepts your insurance, provides certain services, etc. - Physician Referral Service- [phone]  Chronic Pain Problems: Organization         Address  Phone   Notes  [facility]  [phone] Patients need to be referred by their primary care doctor.   Medication Assistance: Organization         Address  Phone   Notes  [facility] Medication [facility] [address] [place], [place] [address] [phone] --Must be a resident of [facility] --  Must have NO insurance coverage whatsoever (no Medicaid/ Medicare, etc.) -- The pt. MUST have a primary care doctor that directs their care regularly and follows them in the community   [facility]ist  [phone]   [employer]  [phone]    Agencies that provide inexpensive medical care: Organization         Address  Phone   Notes  [name]  [phone]   [name]    [phone]   [facility] [address] [place], [place] [address] [phone]   [facility] [address] [place] [place] [phone]   Planned Parenthood    [phone]   [facility]    [phone]   [facility]  [address], [place] Phone:  [phone], Fax:  [phone] Hours of Operation:  9 am - 6 pm, M-F.  Also accepts Medicaid/Medicare and self-pay.  [facility]  [address], [place] Phone: [phone], Fax: [phone]. Hours of Operation:  8:30 am - 5:30 pm, M-F.  Also accepts Medicaid and self-pay.  [facility] [place] [address] [place] Phone: [phone]   [facility] [address] Natasha Bence [place], [place] [phone], Ext. [phone] Mondays & Thursdays: 7-9 AM.  First 15 patients are seen on a first come, first serve basis.    Medicaid-accepting [facility] Providers:  Organization         Address  Phone   Notes  [facility] [address], [place] [phone] Also accepts self-pay patients.  [facility] [address] Laurell Josephs [place], [place]  [phone]   [facility] [address], [place] [phone]   [facility] [address] [place] [phone]   Renaye Rakers [address], [place]   [phone] Only accepts [place] Access [place] patients  after they have their name applied to their card.   Self-Pay (no insurance) in [facility]:  Organization         Address  Phone   Notes  Sickle Cell Patients, [facility] [address] [place], [place] [phone]   [facility] [address] [place], [place] [phone]   [name]  [address], [place] [phone]   [facility]/Dr. Osei-Bonsu  [address], [place] or [address], [place] [phone] Phone number for both [place] and [place] locations is the same.  [facility] [address] [place] [phone]   [facility] [address] [place] or [address] [phone]   [facility] [address] [place] [phone], phone; [phone], fax Sees patients 1st and 3rd Saturday of every month.  Must not qualify for public or private insurance (i.e. Medicaid, Medicare, [place] Health Choice, Veterans' Benefits)  Household income should be no more than 200% of the poverty level The clinic cannot treat you if you are pregnant or think you are pregnant  Sexually transmitted diseases are not treated at the clinic.    Dental Care: Organization         Address  Phone  Notes  [facility] Department of [facility] [address] [place], [place] [phone] Accepts children up to age 24 who are enrolled in [place] or [place] Health Choice; pregnant women with a Medicaid card; and children who have applied for Medicaid or [place] Health Choice, but were declined, whose parents can pay a reduced fee at time of service.  [facility] Department of [facility]  [address], [place] [phone] Accepts children up to age 58 who are enrolled in [place] or [place] Health Choice; pregnant women with a Medicaid card; and children  who have applied for Medicaid or [place] Health Choice, but were declined, whose parents can pay a reduced fee at time of service.  [facility]  [address] [place], [place] [phone] Patients are seen by appointment only. Walk-ins are not accepted. [facility] will see patients 18 years  of age and older. Monday - Tuesday (8am-5pm) Most Wednesdays (8:30-5pm) $30 per visit, cash only  [facility] Adult Dental Access PROGRAM  [address], [facility] [phone] Patients are seen by appointment only. Walk-ins are not accepted. [facility] will see patients 17 years of age and older. One Wednesday Evening (Monthly: Volunteer Based).  $30 per visit, cash only  [employer]  [phone] for adults; Children under age 32, call Graduate Pediatric Dentistry at [phone]. Children aged 70-14, please call [phone] to request a pediatric application.  Dental services are provided in all areas of dental care including fillings, crowns and bridges, complete and partial dentures, implants, gum treatment, root canals, and extractions. Preventive care is also provided. Treatment is provided to both adults and children. Patients are selected via a lottery and there is often a waiting list.   [facility] [address], [place]  [phone] [email or web address]   [facility] [address] [place], [place] [phone], Ext. [phone] Second and Fourth Thursday of each month, opens at 6:30 AM; Clinic ends at 9 AM.  Patients are seen on a first-come first-served basis, and a limited number are seen during each clinic.   [facility]  [address] Ether Griffins [place], [place] [phone]   Eligibility Requirements You must have lived in [place], [place], or [place] counties for at least the last three months.   You cannot be eligible for state or federal sponsored [place], including [place], [place], or [employer].   You generally cannot be eligible for healthcare insurance through your employer.    How to apply: Eligibility screenings are held every Tuesday and Wednesday afternoon from 1:00 pm until 4:00 pm. You do not need an appointment for the interview!  [facility] [address] [place], [place] [phone]   [facility] Health Department  [phone]   [facility] Health Department  [phone]   [facility]  [phone]    Behavioral Health Resources in the Community: Intensive Outpatient Programs Organization         Address  Phone  Notes  [facility] [address], [place], [place] [phone]   [facility] [address] [place], [place] [phone]   [facility] [address] [place], [place]  [phone]   [facility] Mental Health [address],  [place], [place] [phone] or [phone]   Substance Abuse Resources Organization         Address  Phone  Notes  [facility]  [phone]   [facility]  [phone]   [facility] [place]  [phone]   Floydene Flock  [phone]   Residential & Outpatient Substance Abuse Program  [phone]   Psychological Services Organization         Address  Phone  Notes  [facility] Behavioral Health  [phone]   [facility]  [phone]   [facility] [address], [place] [phone] or [phone]    Mobile Crisis Teams Organization         Address  Phone  Notes  [facility], Mobile Crisis Care Unit  [phone]   [facility]  [address] [place], [place] [phone]   [facility] [address] [place] [phone]    Self-Help/Support Groups Organization  Address  Phone             Notes  [facility] of [place] -  variety of support groups  336- I7437963 Call for more information  Narcotics Anonymous (NA), [facility] [address], [employer] [place]  2 meetings at this location   Statistician         Address  Phone  Notes  [facility] [address],    [place]  [phone]   [facility]  [address] [place] [address], [place], [place] [phone]   [facility] [address] [place], [place] [phone] Admissions: 8am-3pm M-F  [facility] [address],    [place], [place] [phone]   [facility] [address] [place], [place], [place] [phone]   [facility] [address]  [place], [place] [phone]   [facility] - Intensive Outpatient [address] Laurell Josephs [address], [place], [place] [phone]   [facility] ([facility] [address]  [place], [place] [phone] or [phone]   [facility] ([facility]) [address], [place], [place] [phone] Accepts Medicaid  [facility] [place] [address]  [place] [phone] Substance Abuse/Addiction Treatment   [facility] Organization         Address  Phone  Notes  [facility]  [phone]   Angie Fava, PhD [address] Ervin Knack [place], [place]   [phone] or [phone]   [facility] Behavioral   [address] [place], [place] [phone]   [facility] [address] [place], [place] [phone] Insurance/Medicaid/sponsorship through [facility] and Families [address]                                    [place], [place] [phone] Therapy/tele-psych/case  [facility] [address], [place] [phone]    Dr. Lolly Mustache  [phone]   [facility]  [employer] [facility] Dept. 1) [address] [place] 2) [address] [place] 3)  [address], [place] [phone]  [phone]   [facility] Child Abuse Hotline (574) [phone] or [phone] (After Hours)

## 2014-06-01 NOTE — ED Provider Notes (Signed)
Medical screening examination/treatment/procedure(s) were performed by non-physician practitioner and as supervising physician I was immediately available for consultation/collaboration.   EKG Interpretation None        Greer Koeppen, MD 06/01/14 0710 

## 2014-10-14 ENCOUNTER — Encounter (HOSPITAL_COMMUNITY): Payer: Self-pay | Admitting: *Deleted

## 2014-10-14 ENCOUNTER — Emergency Department (HOSPITAL_COMMUNITY)
Admission: EM | Admit: 2014-10-14 | Discharge: 2014-10-14 | Disposition: A | Discharge status: Home / Self Care | Payer: Self-pay | Attending: Emergency Medicine | Admitting: Emergency Medicine

## 2014-10-14 DIAGNOSIS — O99351 Diseases of the nervous system complicating pregnancy, first trimester: Secondary | ICD-10-CM | POA: Insufficient documentation

## 2014-10-14 DIAGNOSIS — Z7952 Long term (current) use of systemic steroids: Secondary | ICD-10-CM | POA: Insufficient documentation

## 2014-10-14 DIAGNOSIS — K2970 Gastritis, unspecified, without bleeding: Secondary | ICD-10-CM | POA: Insufficient documentation

## 2014-10-14 DIAGNOSIS — T39395A Adverse effect of other nonsteroidal anti-inflammatory drugs [NSAID], initial encounter: Secondary | ICD-10-CM | POA: Insufficient documentation

## 2014-10-14 DIAGNOSIS — Z79899 Other long term (current) drug therapy: Secondary | ICD-10-CM | POA: Insufficient documentation

## 2014-10-14 DIAGNOSIS — J45909 Unspecified asthma, uncomplicated: Secondary | ICD-10-CM | POA: Insufficient documentation

## 2014-10-14 DIAGNOSIS — Z7951 Long term (current) use of inhaled steroids: Secondary | ICD-10-CM | POA: Insufficient documentation

## 2014-10-14 DIAGNOSIS — Z3490 Encounter for supervision of normal pregnancy, unspecified, unspecified trimester: Secondary | ICD-10-CM

## 2014-10-14 DIAGNOSIS — K2960 Other gastritis without bleeding: Secondary | ICD-10-CM

## 2014-10-14 DIAGNOSIS — Z8739 Personal history of other diseases of the musculoskeletal system and connective tissue: Secondary | ICD-10-CM | POA: Insufficient documentation

## 2014-10-14 DIAGNOSIS — K219 Gastro-esophageal reflux disease without esophagitis: Secondary | ICD-10-CM | POA: Insufficient documentation

## 2014-10-14 DIAGNOSIS — F419 Anxiety disorder, unspecified: Secondary | ICD-10-CM | POA: Insufficient documentation

## 2014-10-14 DIAGNOSIS — O99511 Diseases of the respiratory system complicating pregnancy, first trimester: Secondary | ICD-10-CM | POA: Insufficient documentation

## 2014-10-14 DIAGNOSIS — O99341 Other mental disorders complicating pregnancy, first trimester: Secondary | ICD-10-CM | POA: Insufficient documentation

## 2014-10-14 DIAGNOSIS — O99611 Diseases of the digestive system complicating pregnancy, first trimester: Secondary | ICD-10-CM | POA: Insufficient documentation

## 2014-10-14 DIAGNOSIS — G43909 Migraine, unspecified, not intractable, without status migrainosus: Secondary | ICD-10-CM | POA: Insufficient documentation

## 2014-10-14 DIAGNOSIS — Z3A09 9 weeks gestation of pregnancy: Secondary | ICD-10-CM | POA: Insufficient documentation

## 2014-10-14 LAB — COMPREHENSIVE METABOLIC PANEL
ALBUMIN: 3.6 g/dL (ref 3.5–5.2)
ALT: 23 U/L (ref 0–35)
AST: 21 U/L (ref 0–37)
Alkaline Phosphatase: 84 U/L (ref 39–117)
Anion gap: 6 (ref 5–15)
BILIRUBIN TOTAL: 0.2 mg/dL — AB (ref 0.3–1.2)
BUN: 5 mg/dL — ABNORMAL LOW (ref 6–23)
CALCIUM: 9 mg/dL (ref 8.4–10.5)
CHLORIDE: 108 mmol/L (ref 96–112)
CO2: 23 mmol/L (ref 19–32)
CREATININE: 0.66 mg/dL (ref 0.50–1.10)
GFR calc Af Amer: >90 mL/min (ref >90)
GFR calc non Af Amer: >90 mL/min (ref >90)
Glucose, Bld: 124 mg/dL — ABNORMAL HIGH (ref 70–99)
Potassium: 3.2 mmol/L — ABNORMAL LOW (ref 3.5–5.1)
SODIUM: 137 mmol/L (ref 135–145)
TOTAL PROTEIN: 7 g/dL (ref 6.0–8.3)

## 2014-10-14 LAB — CBC WITH DIFFERENTIAL/PLATELET
BASOS PCT: 0 % (ref 0–1)
Basophils Absolute: 0 10*3/uL (ref 0.0–0.1)
EOS ABS: 0.2 10*3/uL (ref 0.0–0.7)
Eosinophils Relative: 3 % (ref 0–5)
HEMATOCRIT: 32.7 % — AB (ref 36.0–46.0)
HEMOGLOBIN: 11.3 g/dL — AB (ref 12.0–15.0)
LYMPHS PCT: 33 % (ref 12–46)
Lymphs Abs: 2.6 10*3/uL (ref 0.7–4.0)
MCH: 28.9 pg (ref 26.0–34.0)
MCHC: 34.6 g/dL (ref 30.0–36.0)
MCV: 83.6 fL (ref 78.0–100.0)
Monocytes Absolute: 0.7 10*3/uL (ref 0.1–1.0)
Monocytes Relative: 8 % (ref 3–12)
NEUTROS ABS: 4.5 10*3/uL (ref 1.7–7.7)
Neutrophils Relative %: 56 % (ref 43–77)
Platelets: 296 10*3/uL (ref 150–400)
RBC: 3.91 MIL/uL (ref 3.87–5.11)
RDW: 13 % (ref 11.5–15.5)
WBC: 8 10*3/uL (ref 4.0–10.5)

## 2014-10-14 LAB — LIPASE, BLOOD: Lipase: 26 U/L (ref 11–59)

## 2014-10-14 LAB — URINALYSIS, ROUTINE W REFLEX MICROSCOPIC
BILIRUBIN URINE: NEGATIVE
Glucose, UA: NEGATIVE mg/dL
Hgb urine dipstick: NEGATIVE
KETONES UR: NEGATIVE mg/dL
LEUKOCYTES UA: NEGATIVE
NITRITE: NEGATIVE
PROTEIN: NEGATIVE mg/dL
Specific Gravity, Urine: 1.007 (ref 1.005–1.030)
Urobilinogen, UA: 0.2 mg/dL (ref 0.0–1.0)
pH: 6 (ref 5.0–8.0)

## 2014-10-14 LAB — PREGNANCY, URINE: PREG TEST UR: POSITIVE — AB

## 2014-10-14 MED ORDER — GI COCKTAIL ~~LOC~~
30.0000 mL | Freq: Once | ORAL | Status: AC
  Administered 2014-10-14: 30 mL via ORAL
  Filled 2014-10-14: qty 30

## 2014-10-14 MED ORDER — ONDANSETRON 4 MG PO TBDP: ORAL_TABLET | ORAL | Status: DC

## 2014-10-14 MED ORDER — LANSOPRAZOLE 30 MG PO CPDR: 30.0000 mg | DELAYED_RELEASE_CAPSULE | Freq: Every day | ORAL | Status: DC

## 2014-10-14 MED ORDER — ONDANSETRON 4 MG PO TBDP
4.0000 mg | ORAL_TABLET | Freq: Once | ORAL | Status: AC
  Administered 2014-10-14: 4 mg via ORAL
  Filled 2014-10-14: qty 1

## 2014-10-14 NOTE — ED Notes (Signed)
abd pain for one month with nv.  lmp   dec

## 2014-10-14 NOTE — ED Provider Notes (Addendum)
CSN: [id number]     Arrival date & time 10/14/14  0250 History  This chart was scribed for Patricia Racer, MD by Bronson Curb, ED Scribe. This patient was seen in room B16C/B16C and the patient's care was started at 3:13 AM.     Chief Complaint  Patient presents with  . Abdominal Pain     The history is provided by the patient. No language interpreter was used.     HPI Comments: [name] is a 34 y.o. female, with history of GERD, who presents to the Emergency Department complaining of intermittent LUQ abdominal pain for the past month. She reports this pain radiates to the left flank and to the LLE. She notes this episode of pain started 1 hour ago waking her from sleep. There is associated nausea and vomiting (6 episodes in the last day). Patient states her last BM was 2 days ago, but states this is baseline.She reports taking  of ibuprofen 7 hours ago which she does routinely for back pain.  Patient last ate approximately 10 hours ago. She also reports history of prior similar episode after eating spaghetti. She denies fever, chills, diarrhea or any urinary symptoms. She denies being on any PPIs. Denies urinary symptoms   Past Medical History  Diagnosis Date  . Neck pain   . Migraines   . Depression   . Anxiety     relative to circumstances to back problems/injury, not taking any medicine, pt. reports has been crying a lot  . Asthma   . GERD (gastroesophageal reflux disease)   . Arthritis     low back   Past Surgical History  Procedure Laterality Date  . Lumbar laminectomy/decompression microdiscectomy  06/05/2012    Procedure: LUMBAR LAMINECTOMY/DECOMPRESSION MICRODISCECTOMY;  Surgeon: Emilee Hero, MD;  Location: [facility] OR;  Service: Orthopedics;  Laterality: Left;  Left sided lumbar 4-5 microdisectomy  . Childbirth      x4 vaginal    No family history on file. History  Substance Use Topics  . Smoking status: Never Smoker   . Smokeless tobacco: Never Used   . Alcohol Use: No   OB History    No data available     Review of Systems  Constitutional: Negative for fever and chills.  Respiratory: Negative for shortness of breath.   Cardiovascular: Negative for chest pain.  Gastrointestinal: Positive for nausea, vomiting and abdominal pain. Negative for diarrhea and constipation.  Genitourinary: Positive for flank pain. Negative for dysuria, urgency, frequency and hematuria.  Musculoskeletal: Positive for back pain. Negative for neck pain and neck stiffness.  Skin: Negative for rash and wound.  Neurological: Negative for dizziness, weakness, light-headedness and headaches.  All other systems reviewed and are negative.     Allergies  Hydrocodone-acetaminophen  Home Medications   Prior to Admission medications   Medication Sig Start Date End Date Taking? Authorizing Provider  albuterol (PROVENTIL HFA;VENTOLIN HFA) 108 (90 BASE) MCG/ACT inhaler Inhale 2 puffs into the lungs every 4 (four) hours as needed for wheezing.    Historical Provider, MD  albuterol (PROVENTIL HFA;VENTOLIN HFA) 108 (90 BASE) MCG/ACT inhaler Inhale 1-2 puffs into the lungs every 6 (six) hours as needed for wheezing or shortness of breath. 05/24/14   Trixie Dredge, PA-C  beclomethasone (QVAR) 40 MCG/ACT inhaler Inhale 2 puffs into the lungs 2 (two) times daily. 05/24/14   Trixie Dredge, PA-C  calcium carbonate (TUMS EX) 750 MG chewable tablet Chew 1 tablet by mouth 2 (two) times daily as needed for  heartburn.    Historical Provider, MD  DULoxetine (CYMBALTA) 30 MG capsule Take 1 capsule (30 mg total) by mouth daily. For depression and anxiety and pain. 06/03/13   Verne SpurrNeil Mashburn, PA-C  gabapentin (NEURONTIN) 600 MG tablet Take 1 tablet (600 mg total) by mouth 3 (three) times daily. For anxiety, neuropathic pain and depression. 06/03/13   Verne SpurrNeil Mashburn, PA-C  predniSONE (STERAPRED UNI-PAK) 10 MG tablet Take by mouth daily. Day 1: take 6 tabs.  Day 2: 5 tabs  Day 3: 4 tabs  Day 4: 3 tabs   Day 5: 2 tabs  Day 6: 1 tab 05/24/14   Trixie DredgeEmily West, PA-C   Triage Vitals: BP 125/88 mmHg  Pulse 99  Temp(Src) 98.2 F (36.8 C)  Resp 17  SpO2 100%  LMP 08/13/2014  Physical Exam  Constitutional: She is oriented to person, place, and time. She appears well-developed and well-nourished. No distress.  HENT:  Head: Normocephalic and atraumatic.  Mouth/Throat: Oropharynx is clear and moist.  Eyes: Conjunctivae and EOM are normal. Pupils are equal, round, and reactive to light.  Neck: Normal range of motion. Neck supple. No tracheal deviation present.  Cardiovascular: Normal rate and regular rhythm.   Pulmonary/Chest: Effort normal and breath sounds normal. No respiratory distress. She has no wheezes. She has no rales. She exhibits no tenderness.  Abdominal: Soft. Bowel sounds are normal. She exhibits no distension and no mass. There is tenderness. There is no rebound and no guarding.  Patient with epigastric and left upper quadrant tenderness with palpation. No rebound or guarding.  Musculoskeletal: Normal range of motion. She exhibits tenderness. She exhibits no edema.  Mild left flank tenderness with palpation. No definite CVA tenderness.  Neurological: [name] and oriented to person, place, and time.  Skin: Skin is warm and dry. No rash noted. No erythema.  Psychiatric: She has a normal mood and affect. Her behavior is normal.  Nursing note and vitals reviewed.   ED Course  Procedures (including critical care time)  DIAGNOSTIC STUDIES: Oxygen Saturation is 100% on room air, normal by my interpretation.    COORDINATION OF CARE: At 0319 Discussed treatment plan with patient which includes antiemetic and GI cocktail. Patient agrees.   Labs Review Labs Reviewed - No data to display  Imaging Review No results found.   EKG Interpretation None      MDM   Final diagnoses:  None    I personally performed the services described in this documentation, which was scribed  in my presence. The recorded information has been reviewed and is accurate.  Patient's abdominal pain is improved. No tenderness with palpation over the epigastric and left upper quadrants. Patient has had no point had any lower abdominal tenderness. Urgency test came back positive. She states her last menstrual period was December 5. That puts her at 8 5/7 weeks. She continues to deny any vaginal bleeding or discharge. I have a low suspicion for ectopic pregnancy. I have advised the patient to discontinue all NSAIDs. Likely her pain is due to NSAID-induced gastritis. We'll start on PPI and give Zofran. Patient can take over-the-counter Mylanta or Maalox. Also given follow-up with OB/GYN. Return precautions have been given and the patient has voiced understanding.    Patricia Raceravid Jennessa Trigo, MD 10/14/14 [phone]   Bedside ultrasound performed demonstrating IUP.   Patricia Raceravid Tiaira Arambula, MD 10/14/14 [phone]

## 2014-10-14 NOTE — ED Notes (Signed)
[place], MD at bedside. 

## 2014-10-14 NOTE — ED Notes (Signed)
Pt dry heaving, not bringing up vomit. Pt given a cup of water to rinse her mouth out.

## 2014-10-14 NOTE — ED Notes (Signed)
Pt reports left lower abdominal pain x1 month radiating to left flank.  Pt reports last menstrual cycle 08/14/14.  Pt reports last bm 2 days ago and has had nausea and 6 episodes of vomiting in the last day.  Pt alert and oriented.

## 2014-10-14 NOTE — Discharge Instructions (Signed)
Stop taking all NSAIDs including ibuprofen.: Neck and appointment to follow-up with an OB/GYN. Return immediately for worsening pain, vaginal bleeding, persistent vomiting or for any concerns.   Gastritis - Adultos  (Gastritis, Adult)  La gastrittis es la irritacin (inflamacin) de la membrana interna del estmago. Puede ser Neomia Dear enfermedad de inicio sbito (aguda) o de largo plazo (crnica). Si la gastritis no se trata, puede causar sangrado y lceras. CAUSAS  La gastritis se produce cuando la membrana que tapiza interiormente al estmago se debilita o se daa. Los jugos digestivos del estmago inflaman el revestimiento del estmago debilitado. El revestimiento del estmago puede debilitarse o daarse por una infeccin viral o bacteriana. La infeccin bacteriana ms comn es la infeccin por Helicobacter pylori. Tambin puede ser el resultado del consumo excesivo de alcohol, por el uso de ciertos medicamentos o porque hay demasiado cido en el estmago.  SNTOMAS  En algunos casos no hay sntomas. Si se presentan sntomas, stos pueden ser:   Dolor o sensacin de ardor en la parte superior del abdomen.  Nuseas.  Vmitos.  Sensacin molesta de distensin despus de comer. DIAGNSTICO  El mdico puede diagnosticar gastritis segn los sntomas y el examen fsico. Para determinar la causa de la gastritis, el mdico podr:   Pedir anlisis de sangre o de materia fecal para diagnosticar la presencia de la bacteria H pylori.  Gastroscopa. Un tubo delgado y flexible (endoscopio) se pasa por Theatre stage manager al [employer]. El endoscopio tiene [place] luz y una cmara en el extremo. El mdico utilizar el endoscopio para observar el interior del [place].  Tomar una muestra de tejido (biopsia) del estmago para examinarlo en el microscopio. TRATAMIENTO  Segn la causa de la gastritis podrn recetarle: Antibiticos, si la causa es una infeccin bacteriana, como una infeccin por H. pylori.  Anticidos o bloqueadores H2, si hay demasiado cido en el estmago. El [employer] aconsejar que deje de tomar aspirina, ibuprofeno u otros antiinflamatorios no esteroides (AINE).  INSTRUCCIONES PARA EL CUIDADO EN EL HOGAR   Tome slo medicamentos de venta libre o recetados, segn las indicaciones del mdico.  Si le han recetado antibiticos, tmelos segn las indicaciones. Tmelos todos, aunque se sienta mejor.  Debe ingerir gran cantidad de lquido para mantener la orina de tono claro o color amarillo plido.  Evite las comidas y bebidas que [address] [place], [place]:  [place] con cafena o alcohlicas.  Chocolate.  Sabores a Advertising account planner.  Ajo y cebolla.  Comidas muy condimentadas.  Ctricos como naranjas, limones o limas.  Alimentos que contengan tomate, como salsas, [place] y pizza.  Alimentos fritos y Lexicographer.  Haga comidas pequeas durante Glass blower/designer de 3 comidas abundantes. SOLICITE ATENCIN MDICA DE INMEDIATO SI:   La materia fecal es negra o de color rojo oscuro.  Vomita sangre de color rojo brillante o material similar a granos de caf.  No puede retener los lquidos.  El dolor abdominal empeora.  Tiene fiebre.  No mejora luego de 1 semana.  Tiene preguntas o preocupaciones. ASEGRESE DE QUE:   Comprende estas instrucciones.  Controlar su enfermedad.  Solicitar ayuda de inmediato si no mejora o si empeora. Document Released: 06/06/2005 Document Revised: 05/21/2012 [facility] Patient Information 2015 [place], [place]. This information is not intended to replace advice given to you by your health care provider. Make sure you discuss any questions you have with your health care provider.  Primer trimestre de Psychiatrist (First Trimester of Pregnancy) El primer trimestre de Psychiatrist se  extiende desde la semana1 hasta el final de la semana12 (mes1 al mes3). Una semana despus de que un espermatozoide fecunda un vulo, este se implantar en la pared uterina.  Este embrin comenzar a Camera operatordesarrollarse hasta convertirse en un beb. Sus genes y los de su pareja forman el beb. Los genes del varn determinan si ser un nio o una nia. Entre la semana6 y [place]8, se forman los ojos y [place] rostro, y los latidos del corazn pueden verse en la ecografa. Al final de las 12semanas, todos los rganos del beb estn formados.  Ahora que est embarazada, querr hacer todo lo que est a su alcance para tener un beb sano. Dos de las cosas ms importantes son Winferd Humphreytener una buena atencin prenatal y seguir las indicaciones del mdico. La atencin prenatal incluye toda la asistencia mdica que usted recibe antes del nacimiento del beb. Esta ayudar a prevenir, Engineer, manufacturingdetectar y tratar cualquier problema durante el embarazo y Barryel parto. CAMBIOS EN EL ORGANISMO Su organismo atraviesa por muchos cambios durante el [place]barazo, y estos varan de Neomia Dearuna mujer a Educational psychologistotra.   Al principio, puede aumentar o bajar algunos kilos.  Puede tener Programme researcher, broadcasting/film/videomalestar estomacal (nuseas) y vomitar. Si no puede controlar los vmitos, llame al mdico.  Puede cansarse con facilidad.  Es posible que tenga dolores de cabeza que pueden aliviarse con los medicamentos que el mdico le permita tomar.  Puede orinar con mayor frecuencia. El dolor al orinar puede significar que usted tiene una infeccin de la vejiga.  Debido al Vanetta Muldersembarazo, puede tener acidez estomacal.  Puede estar estreida, ya que ciertas hormonas enlentecen los movimientos de los msculos que New York Life Insuranceempujan los desechos a travs de los intestinos.  Pueden aparecer hemorroides o abultarse e hincharse las venas (venas varicosas).  Las ConAgra Foodsmamas pueden empezar a Government social research officeragrandarse y Emergency planning/management officerestar sensibles. Los pezones pueden sobresalir ms, y el tejido que los rodea (areola) tornarse ms oscuro.  Las Veterinary surgeonencas pueden sangrar y estar sensibles al cepillado y al hilo dental.  Pueden aparecer zonas oscuras o manchas (cloasma, mscara del Psychiatristembarazo) en el rostro que probablemente se atenuarn  despus del nacimiento del beb.  Los perodos menstruales se interrumpirn.  Tal vez no tenga apetito.  Puede sentir un fuerte deseo de consumir ciertos alimentos.  Puede tener cambios a Theatre managernivel emocional da a da, por ejemplo, por momentos puede estar emocionada por el Psychiatristembarazo y por otros preocuparse porque algo pueda salir mal con el embarazo o el beb.  Tendr sueos ms vvidos y extraos.  Tal vez haya cambios en el cabello que pueden incluir su engrosamiento, crecimiento rpido y cambios en la textura. A algunas mujeres tambin se les cae el cabello durante o despus del Sundanceembarazo, o tienen el cabello seco o fino. Lo ms probable es que el cabello se le normalice despus del nacimiento del beb. QU DEBE ESPERAR EN LAS CONSULTAS PRENATALES Durante una visita prenatal de rutina:  La pesarn para asegurarse de que usted y el beb estn creciendo normalmente.  Le controlarn la presin arterial.  Le medirn el abdomen para controlar el desarrollo del beb.  Se escucharn los latidos cardacos a partir de la semana10 o la12 de embarazo, aproximadamente.  Se analizarn los resultados de los estudios solicitados en visitas anteriores. El mdico puede preguntarle:  Cmo se siente.  Si siente los movimientos del beb.  Si ha tenido sntomas anormales, como prdida de lquido, Fox Parksangrado, dolores de cabeza intensos o clicos abdominales.  Si tiene Colgate-Palmolivealguna pregunta. Otros estudios que pueden realizarse durante  el primer trimestre incluyen lo siguiente:  Anlisis de sangre para determinar el tipo de sangre y Engineer, manufacturing la presencia de infecciones previas. Adems, se los usar para controlar si los niveles de hierro son bajos (anemia) y Chief Strategy Officer los anticuerpos Rh. En una etapa ms avanzada del [place], se harn anlisis de sangre para saber si tiene diabetes, junto con otros estudios si surgen problemas.  Anlisis de orina para detectar infecciones, diabetes o protenas en la  orina.  Una ecografa para confirmar que el beb crece y se desarrolla correctamente.  Una amniocentesis para diagnosticar posibles problemas genticos.  Estudios del feto para descartar espina bfida y sndrome de [place].  Es posible que necesite otras pruebas adicionales. INSTRUCCIONES PARA EL CUIDADO EN EL HOGAR  Medicamentos:  Siga las indicaciones del mdico en relacin con el uso de medicamentos. Durante el embarazo, hay medicamentos que pueden tomarse y otros que no.  Tome las vitaminas prenatales como se le indic.  Si est estreida, tome un laxante suave, si el mdico lo [place]. Dieta  Consuma alimentos balanceados. Elija alimentos variados, como carne o protenas de origen vegetal, pescado, leche y productos lcteos descremados, verduras, frutas y panes y Radiation protection practitioner. El mdico la ayudar a Production assistant, radio cantidad de peso que puede [place].  No coma carne cruda ni quesos sin cocinar. Estos elementos contienen bacterias que pueden causar defectos congnitos en el beb.  La ingesta diaria de cuatro o cinco comidas pequeas en lugar de tres comidas abundantes puede ayudar a [employer] nuseas y los vmitos. Si empieza a tener nuseas, comer algunas [address] puede ser de [place]. Beber lquidos [place] comidas en lugar de tomarlos durante las comidas tambin puede ayudar a Optician, dispensing las nuseas y los vmitos.  Si est estreida, consuma alimentos con alto contenido de [place], como verduras y frutas frescas, y Radiation protection practitioner. Beba suficiente lquido para Photographer orina clara o de color amarillo plido. Actividad y Landscape architect ejercicio solamente como se lo haya indicado el mdico. El ejercicio la ayudar a:  Art gallery manager.  Mantenerse en forma.  Estar preparada para el trabajo de parto y [place].  Los dolores, los clicos en la parte baja del abdomen o los calambres en la cintura son un buen indicio de que debe dejar de Corporate treasurer.  Consulte al mdico antes de seguir haciendo ejercicios normales.  Intente no estar de pie FedEx. Mueva las piernas con frecuencia si debe estar de pie en un lugar durante mucho tiempo.  Evite levantar pesos [employer] Brands.  Use zapatos de tacones bajos y [place].  Puede seguir teniendo [employer] [place], excepto que el mdico le indique lo contrario. Alivio del dolor o las molestias  Use un sostn que le brinde buen soporte si siente dolor a la palpacin Mattel.  Dese baos de asiento con agua tibia para Engineer, materials o las molestias causadas por las hemorroides. Use crema antihemorroidal si el mdico se lo permite.  Descanse con las piernas elevadas si tiene calambres o dolor de cintura.  Si tiene venas varicosas en las piernas, use medias de descanso. Eleve los pies durante , 3 o 4veces por da. Limite la cantidad de sal en su dieta. Cuidados prenatales  Programe las visitas prenatales para la semana12 de [place]. Generalmente se programan cada mes al principio y se hacen ms frecuentes en los 2 ltimos meses antes del parto.  Escriba sus preguntas. Llvelas cuando concurra a las visitas prenatales.  Concurra a todas las visitas prenatales como se lo haya indicado el mdico. Seguridad  Colquese el cinturn de seguridad cuando conduzca.  Haga una lista de los nmeros de telfono de Associate Professor, que [employer] nmeros de telfono de familiares, [place], el hospital y los departamentos de polica y bomberos. Consejos generales  [place] al mdico que la derive a clases de educacin prenatal en su localidad. Debe comenzar a tomar las clases antes de Cytogeneticist en el mes6 de embarazo.  Pida ayuda si tiene necesidades nutricionales o de asesoramiento Academic librarian. El mdico puede aconsejarla o derivarla a especialistas para que la ayuden con diferentes necesidades.  No se d baos de inmersin en agua caliente, baos turcos ni  saunas.  No se haga duchas vaginales ni use tampones o toallas higinicas perfumadas.  No mantenga las piernas cruzadas durante [place].  Evite el contacto con las bandejas sanitarias de los gatos y la tierra que estos animales usan. Estos elementos contienen bacterias que pueden causar defectos congnitos al beb y la posible prdida del feto debido a un aborto espontneo o muerte fetal.  No fume, no consuma hierbas ni medicamentos que no hayan sido recetados por el mdico. Las sustancias qumicas que estos productos contienen afectan la formacin y el desarrollo del beb.  Programe una cita con el dentista. En su casa, lvese los dientes con un cepillo dental blando y psese el hilo dental con suavidad. SOLICITE ATENCIN MDICA SI:   Tiene mareos.  Siente clicos leves, presin en la pelvis o dolor persistente en el abdomen.  Tiene nuseas, vmitos o diarrea persistentes.  Tiene secrecin vaginal con mal olor.  Siente dolor al [employer]oco[employer]illips.  Tiene el rostro, las [place], las piernas o los tobillos ms hinchados. SOLICITE ATENCIN MDICA DE INMEDIATO SI:   Tiene fiebre.  Tiene una prdida de lquido por la vagina.  Tiene sangrado o pequeas prdidas vaginales.  Siente dolor intenso o clicos en el abdomen.  Sube o baja de peso rpidamente.  Vomita sangre de color rojo brillante o material que parezca granos de caf.  Ha estado expuesta a la rubola y no ha sufrido la enfermedad.  Ha estado expuesta a la quinta enfermedad o a la varicela.  Tiene un dolor de cabeza intenso.  Le falta el aire.  Sufre cualquier tipo de traumatismo, por ejemplo, debido a una cada o un accidente automovilstico. Document Released: 06/06/2005 Document Revised: 01/11/2014 [facility] Patient Information 2015 [place], [place]. This information is not intended to replace advice given to you by your health care provider. Make sure you discuss any questions you have with your health care provider.

## 2015-01-24 ENCOUNTER — Emergency Department (HOSPITAL_COMMUNITY)
Admission: EM | Admit: 2015-01-24 | Discharge: 2015-01-24 | Disposition: A | Discharge status: Home / Self Care | Payer: Self-pay | Attending: Emergency Medicine | Admitting: Emergency Medicine

## 2015-01-24 ENCOUNTER — Encounter (HOSPITAL_COMMUNITY): Payer: Self-pay | Admitting: Emergency Medicine

## 2015-01-24 ENCOUNTER — Emergency Department (HOSPITAL_COMMUNITY): Payer: Self-pay

## 2015-01-24 DIAGNOSIS — J209 Acute bronchitis, unspecified: Secondary | ICD-10-CM

## 2015-01-24 DIAGNOSIS — K219 Gastro-esophageal reflux disease without esophagitis: Secondary | ICD-10-CM | POA: Insufficient documentation

## 2015-01-24 DIAGNOSIS — Z79899 Other long term (current) drug therapy: Secondary | ICD-10-CM | POA: Insufficient documentation

## 2015-01-24 DIAGNOSIS — F419 Anxiety disorder, unspecified: Secondary | ICD-10-CM | POA: Insufficient documentation

## 2015-01-24 DIAGNOSIS — J45901 Unspecified asthma with (acute) exacerbation: Secondary | ICD-10-CM | POA: Insufficient documentation

## 2015-01-24 DIAGNOSIS — F329 Major depressive disorder, single episode, unspecified: Secondary | ICD-10-CM | POA: Insufficient documentation

## 2015-01-24 DIAGNOSIS — G43909 Migraine, unspecified, not intractable, without status migrainosus: Secondary | ICD-10-CM | POA: Insufficient documentation

## 2015-01-24 DIAGNOSIS — Z8739 Personal history of other diseases of the musculoskeletal system and connective tissue: Secondary | ICD-10-CM | POA: Insufficient documentation

## 2015-01-24 DIAGNOSIS — J069 Acute upper respiratory infection, unspecified: Secondary | ICD-10-CM | POA: Insufficient documentation

## 2015-01-24 LAB — CBC WITH DIFFERENTIAL/PLATELET
Basophils Absolute: 0 10*3/uL (ref 0.0–0.1)
Basophils Relative: 0 % (ref 0–1)
EOS ABS: 0.6 10*3/uL (ref 0.0–0.7)
Eosinophils Relative: 7 % — ABNORMAL HIGH (ref 0–5)
HEMATOCRIT: 33 % — AB (ref 36.0–46.0)
Hemoglobin: 11.2 g/dL — ABNORMAL LOW (ref 12.0–15.0)
LYMPHS ABS: 4.1 10*3/uL — AB (ref 0.7–4.0)
LYMPHS PCT: 43 % (ref 12–46)
MCH: 29.2 pg (ref 26.0–34.0)
MCHC: 33.9 g/dL (ref 30.0–36.0)
MCV: 86.2 fL (ref 78.0–100.0)
MONOS PCT: 7 % (ref 3–12)
Monocytes Absolute: 0.7 10*3/uL (ref 0.1–1.0)
Neutro Abs: 4.1 10*3/uL (ref 1.7–7.7)
Neutrophils Relative %: 43 % (ref 43–77)
Platelets: 294 10*3/uL (ref 150–400)
RBC: 3.83 MIL/uL — AB (ref 3.87–5.11)
RDW: 12.5 % (ref 11.5–15.5)
WBC: 9.5 10*3/uL (ref 4.0–10.5)

## 2015-01-24 LAB — I-STAT CHEM 8, ED
BUN: 13 mg/dL (ref 6–20)
CREATININE: 0.7 mg/dL (ref 0.44–1.00)
Calcium, Ion: 1.13 mmol/L (ref 1.12–1.23)
Chloride: 107 mmol/L (ref 101–111)
Glucose, Bld: 107 mg/dL — ABNORMAL HIGH (ref 65–99)
HEMATOCRIT: 34 % — AB (ref 36.0–46.0)
HEMOGLOBIN: 11.6 g/dL — AB (ref 12.0–15.0)
Potassium: 3.3 mmol/L — ABNORMAL LOW (ref 3.5–5.1)
Sodium: 140 mmol/L (ref 135–145)
TCO2: 17 mmol/L (ref 0–100)

## 2015-01-24 MED ORDER — AEROCHAMBER PLUS W/MASK MISC
1.0000 | Freq: Once | Status: AC
  Administered 2015-01-24: 1
  Filled 2015-01-24: qty 1

## 2015-01-24 MED ORDER — PREDNISONE 20 MG PO TABS: 40.0000 mg | ORAL_TABLET | Freq: Every day | ORAL | Status: DC

## 2015-01-24 MED ORDER — ALBUTEROL SULFATE (2.5 MG/3ML) 0.083% IN NEBU
5.0000 mg | INHALATION_SOLUTION | Freq: Once | RESPIRATORY_TRACT | Status: AC
  Administered 2015-01-24: 5 mg via RESPIRATORY_TRACT

## 2015-01-24 MED ORDER — BENZONATATE 100 MG PO CAPS: 100.0000 mg | ORAL_CAPSULE | Freq: Three times a day (TID) | ORAL | Status: DC

## 2015-01-24 MED ORDER — ALBUTEROL SULFATE (2.5 MG/3ML) 0.083% IN NEBU
INHALATION_SOLUTION | RESPIRATORY_TRACT | Status: AC
  Filled 2015-01-24: qty 6

## 2015-01-24 MED ORDER — ALBUTEROL SULFATE HFA 108 (90 BASE) MCG/ACT IN AERS
2.0000 | INHALATION_SPRAY | Freq: Once | RESPIRATORY_TRACT | Status: AC
  Administered 2015-01-24: 2 via RESPIRATORY_TRACT
  Filled 2015-01-24: qty 6.7

## 2015-01-24 MED ORDER — OXYMETAZOLINE HCL 0.05 % NA SOLN
1.0000 | Freq: Once | NASAL | Status: AC
  Administered 2015-01-24: 1 via NASAL
  Filled 2015-01-24: qty 15

## 2015-01-24 MED ORDER — AZITHROMYCIN 250 MG PO TABS: 250.0000 mg | ORAL_TABLET | Freq: Every day | ORAL | Status: DC

## 2015-01-24 MED ORDER — PREDNISONE 20 MG PO TABS
60.0000 mg | ORAL_TABLET | Freq: Once | ORAL | Status: AC
  Administered 2015-01-24: 60 mg via ORAL
  Filled 2015-01-24: qty 3

## 2015-01-24 NOTE — ED Provider Notes (Signed)
CSN: [id number]     Arrival date & time 01/24/15  0000 History   First MD Initiated Contact with Patient 01/24/15 0110     Chief Complaint  Patient presents with  . Shortness of Breath    (Consider location/radiation/quality/duration/timing/severity/associated sxs/prior Treatment) HPI Comments: Patient is a 34 year old female with a history of depression, anxiety, esophageal reflux, and asthma. She also reports a history of seasonal allergies. She presents to the emergency department this evening for further evaluation of shortness of breath. Patient states that she has had difficulty with this intermittently over the past month. Symptoms worsened yesterday and throughout the evening. Patient reports an audible wheeze prior to arrival. She has been taking Claritin or Zyrtec for symptoms without significant relief. Patient denies the use of albuterol inhaler or nebulizer treatment at home. She had great improvement in her wheezing after a treatment in the emergency department. Patient also reporting associated fatigue with nasal congestion and rhinorrhea. She has had a mild cough. No reported sick contacts. No fever or syncope. No recent surgeries, hospitalizations, or prolonged travel.  Patient is a 34 y.o. female presenting with shortness of breath. The history is provided by the patient. No language interpreter was used.  Shortness of Breath Associated symptoms: wheezing   Associated symptoms: no chest pain, no fever and no vomiting     Past Medical History  Diagnosis Date  . Neck pain   . Migraines   . Depression   . Anxiety     relative to circumstances to back problems/injury, not taking any medicine, pt. reports has been crying a lot  . Asthma   . GERD (gastroesophageal reflux disease)   . Arthritis     low back   Past Surgical History  Procedure Laterality Date  . Lumbar laminectomy/decompression microdiscectomy  06/05/2012    Procedure: LUMBAR LAMINECTOMY/DECOMPRESSION  MICRODISCECTOMY;  Surgeon: Emilee Hero, MD;  Location: [facility] OR;  Service: Orthopedics;  Laterality: Left;  Left sided lumbar 4-5 microdisectomy  . Childbirth      x4 vaginal    No family history on file. History  Substance Use Topics  . Smoking status: Never Smoker   . Smokeless tobacco: Never Used  . Alcohol Use: No   OB History    No data available      Review of Systems  Constitutional: Positive for fatigue. Negative for fever.  HENT: Positive for congestion and rhinorrhea.   Respiratory: Positive for chest tightness, shortness of breath and wheezing.   Cardiovascular: Negative for chest pain.  Gastrointestinal: Negative for vomiting and diarrhea.    Allergies  Hydrocodone-acetaminophen  Home Medications   Prior to Admission medications   Medication Sig Start Date End Date Taking? Authorizing Provider  albuterol (PROVENTIL HFA;VENTOLIN HFA) 108 (90 BASE) MCG/ACT inhaler Inhale 2 puffs into the lungs every 4 (four) hours as needed for wheezing.    Historical Provider, MD  albuterol (PROVENTIL HFA;VENTOLIN HFA) 108 (90 BASE) MCG/ACT inhaler Inhale 1-2 puffs into the lungs every 6 (six) hours as needed for wheezing or shortness of breath. Patient not taking: Reported on 10/14/2014 05/24/14   Trixie Dredge, PA-C  beclomethasone (QVAR) 40 MCG/ACT inhaler Inhale 2 puffs into the lungs 2 (two) times daily. Patient not taking: Reported on 10/14/2014 05/24/14   Trixie Dredge, PA-C  calcium carbonate (TUMS EX) 750 MG chewable tablet Chew 1 tablet by mouth 2 (two) times daily as needed for heartburn.    Historical Provider, MD  DULoxetine (CYMBALTA) 30 MG capsule Take 1  capsule (30 mg total) by mouth daily. For depression and anxiety and pain. Patient not taking: Reported on 10/14/2014 06/03/13   Tamala JulianNeil T Mashburn, PA-C  gabapentin (NEURONTIN) 600 MG tablet Take 1 tablet (600 mg total) by mouth 3 (three) times daily. For anxiety, neuropathic pain and depression. Patient not taking: Reported on  10/14/2014 06/03/13   Tamala JulianNeil T Mashburn, PA-C  lansoprazole (PREVACID) 30 MG capsule Take 1 capsule (30 mg total) by mouth daily at 12 noon. 10/14/14   Loren Raceravid Yelverton, MD  ondansetron (ZOFRAN ODT) 4 MG disintegrating tablet 4mg  ODT q4 hours prn nausea/vomit 10/14/14   Loren Raceravid Yelverton, MD  predniSONE (STERAPRED UNI-PAK) 10 MG tablet Take by mouth daily. Day 1: take 6 tabs.  Day 2: 5 tabs  Day 3: 4 tabs  Day 4: 3 tabs  Day 5: 2 tabs  Day 6: 1 tab Patient not taking: Reported on 10/14/2014 05/24/14   Trixie DredgeEmily West, PA-C   BP 112/80 mmHg  Pulse 82  Temp(Src) 97.9 F (36.6 C) (Oral)  Resp 16  Ht 5\' 4"  (1.626 m)  Wt 200 lb (90.719 kg)  BMI 34.31 kg/m2  SpO2 97%  LMP 01/17/2015   Physical Exam  Constitutional: She is oriented to person, place, and time. She appears well-developed and well-nourished. No distress.  Nontoxic/nonseptic appearing  HENT:  Head: Normocephalic and atraumatic.  Nose: Mucosal edema present. No rhinorrhea.  Mouth/Throat: Oropharynx is clear and moist.  Audible nasal congestion. Patient tolerating secretions without difficulty.  Eyes: Conjunctivae and EOM are normal. No scleral icterus.  Neck: Normal range of motion.  No JVD  Cardiovascular: Normal rate, regular rhythm and intact distal pulses.   Pulmonary/Chest: Effort normal. No respiratory distress. She has wheezes.  Faint expiratory wheeze in b/l bases. No tachypnea or dyspnea. Chest expansion symmetric. No rales or rhonchi.  Musculoskeletal: Normal range of motion.  Neurological: [name] and oriented to person, place, and time. She exhibits normal muscle tone. Coordination normal.  Skin: Skin is warm and dry. No rash noted. She is not diaphoretic. No erythema. No pallor.  Psychiatric: She has a normal mood and affect. Her behavior is normal.  Nursing note and vitals reviewed.   ED Course  Procedures (including critical care time) Labs Review Labs Reviewed  CBC WITH DIFFERENTIAL/PLATELET - Abnormal; Notable for the  following:    RBC 3.83 (*)    Hemoglobin 11.2 (*)    HCT 33.0 (*)    Lymphs Abs 4.1 (*)    Eosinophils Relative 7 (*)    All other components within normal limits  I-STAT CHEM 8, ED - Abnormal; Notable for the following:    Potassium 3.3 (*)    Glucose, Bld 107 (*)    Hemoglobin 11.6 (*)    HCT 34.0 (*)    All other components within normal limits    Imaging Review Dg Chest 2 View  01/24/2015   CLINICAL DATA:  Shortness of breath.  Cough for 2 months.  EXAM: CHEST  2 VIEW  COMPARISON:  06/05/2012  FINDINGS: The cardiomediastinal contours are normal. The lungs are clear. Pulmonary vasculature is normal. No consolidation, pleural effusion, or pneumothorax. No acute osseous abnormalities are seen.  IMPRESSION: No acute pulmonary process.   Electronically Signed   By: Rubye OaksMelanie  Ehinger M.D.   On: 01/24/2015 01:55     EKG Interpretation None      MDM   Final diagnoses:  Acute URI  Acute bronchitis with bronchospasm    34 year old female presents  to the emergency department for further evaluation of shortness of breath. Patient reports a history of asthma. She has had wheezing which resolved after being given an albuterol nebulizer treatment on arrival. Patient also with associated upper respiratory symptoms such as cough, fatigue, rhinorrhea, and nasal congestion. Chest x-ray negative for pneumonia. Patient has no leukocytosis or left shift. Chemistry panel is noncontributory. EKG is nonischemic. Doubt PE given lack of tachycardia, tachypnea, or hypoxia.  Shortness of breath and upper respiratory symptoms most consistent with acute bronchitis. Patient ambulatory in the ED without hypoxia. Lung sounds stable on reexamination; no evidence of rebound post albuterol treatment. Will cover for bacterial infection with a Z-Pak. Patient to be discharged with supportive treatment. Primary care follow up advised and return precautions discussed. Patient agreeable to plan with no unaddressed  concerns. Patient discharged in good condition; VSS.   Filed Vitals:   01/24/15 0215 01/24/15 0245 01/24/15 0300 01/24/15 0307  BP: 115/71 111/69 99/62   Pulse: 73 74 70   Temp:    98.3 F (36.8 C)  TempSrc:    Oral  Resp: 15 0 16   Height:      Weight:      SpO2: 98% 97% 99%      Antony MaduraKelly Seher Schlagel, PA-C 01/24/15 [phone]  Marisa Severinlga Otter, MD 01/24/15 [phone]

## 2015-01-24 NOTE — ED Notes (Signed)
Patient with shortness of breath, increased today during the day and evening.  Patient with audible wheezes.  Patient with asthma.

## 2015-01-24 NOTE — ED Notes (Signed)
Pt ambulated with pulse oximetry and maintained 100% and tolerated the task well.

## 2015-01-24 NOTE — Discharge Instructions (Signed)
Take prednisone as prescribed. Use an albuterol inhaler, 2 puffs every 4-6 hours, as needed for shortness of breath/chest tightness/wheezing. You may use Tessalon as prescribed for cough. Take azithromycin to cover for bacterial infection. Your chest x-ray today does not show a pneumonia. You may continue taking Claritin or Zyrtec as prescribed. You were given Afrin nasal spray in the ED. You may use 2 sprays in each nostril for an additional 2 days. Do not use after 48 hours. Follow-up with your primary doctor.  Bronquitis aguda (Acute Bronchitis) La bronquitis es una inflamacin de las vas respiratorias que se extienden desde la trquea Lubrizol Corporationhasta los pulmones (bronquios). La inflamacin produce la formacin de mucosidad. Esto produce tos, que es el sntoma ms frecuente de la bronquitis.  Cuando la bronquitis es [place], generalmente comienza de Strangmanera sbita y desaparece luego de un par de semanas. El hbito de fumar, las alergias y el asma pueden empeorar la bronquitis. Los episodios repetidos de bronquitis pueden causar ms problemas pulmonares.  CAUSAS La causa ms frecuente de bronquitis aguda es el mismo virus que produce el resfro. El virus puede propagarse de Neomia Dearuna persona a la otra (contagioso) a travs de la tos y los estornudos, y al tocar objetos contaminados. SIGNOS Y SNTOMAS   Tos.  Grant RutsFiebre.  Tos con mucosidad.  Dolores PepsiCoen el cuerpo.  Congestin en el pecho.  Escalofros.  Falta de aire.  Dolor de Advertising copywritergarganta. DIAGNSTICO  La bronquitis aguda en general se diagnostica con un examen fsico. El mdico tambin le har preguntas sobre su historia clnica. En algunos casos se indican otros estudios, como radiografas, para Risk managerdescartar otras enfermedades.  TRATAMIENTO  La bronquitis aguda generalmente desaparece en un par de semanas. Con frecuencia, no es Quarry managernecesario realizar un tratamiento. Los medicamentos se indican para aliviar la fiebre o la tos. Generalmente, no es necesario el uso de  antibiticos, pero pueden recetarse en ciertas ocasiones. En algunos casos, se recomienda el uso de un inhalador para mejorar la falta de aire y Scientist, physiologicalcontrolar la tos. Un vaporizador de aire fro podr ayudarlo a MeadWestvacodisolver las secreciones bronquiales y Statisticianfacilitar su eliminacin.  INSTRUCCIONES PARA EL CUIDADO EN EL HOGAR  Descanse lo suficiente.  Beba lquidos en abundancia para mantener la orina de color claro o amarillo plido (excepto que padezca una enfermedad que requiera la restriccin de lquidos). El aumento de lquidos puede ayudar a que las secreciones respiratorias (esputo) sean menos espesas y a reducir la congestin del pecho, y Transport plannerevitar la deshidratacin.  Tome los medicamentos solamente como se lo haya indicado el mdico.  Si le recetaron antibiticos, asegrese de terminarlos, incluso si comienza a sentirse mejor.  Evite fumar o aspirar el humo de otros fumadores. La exposicin al humo del cigarrillo o a irritantes qumicos har que la bronquitis empeore. Si fuma, considere el uso de goma de Theatre managermascar o la aplicacin de parches en la piel que contengan nicotina para Paramedicaliviar los sntomas de abstinencia. Si deja de fumar, sus pulmones se curarn ms rpido.  Reduzca la probabilidad de otro episodio de bronquitis aguda lavando sus manos con frecuencia, evitando a las personas que tengan sntomas y tratando de no tocarse las manos con la boca, la nariz o los ojos.  Concurra a todas las visitas de control como se lo haya indicado el mdico. SOLICITE ATENCIN MDICA SI: Los sntomas no mejoran despus de una semana de Hawk Pointtratamiento.  SOLICITE ATENCIN MDICA DE INMEDIATO SI:  Comienza a tener fiebre o escalofros cada vez ms intensos.  Siente  dolor en el pecho.  Le falta el aire de manera preocupante.  La flema tiene [place] Clement[place]re.  Se deshidrata.  Se desmaya o siente que va a desmayarse de forma repetida.  Tiene vmitos que se repiten.  Tiene un dolor de cabeza intenso. ASEGRESE DE QUE:     Comprende estas instrucciones.  Controlar su afeccin.  Recibir ayuda de inmediato si no mejora o si empeora. Document Released: 08/27/2005 Document Revised: 01/11/2014 [facility] Patient Information 2015 [place] [employer], [place]. This information is not intended to replace advice given to you by your health care provider. Make sure you discuss any questions you have with your health care provider.

## 2015-02-02 ENCOUNTER — Ambulatory Visit: Payer: Self-pay

## 2015-03-30 ENCOUNTER — Encounter (HOSPITAL_COMMUNITY): Payer: Self-pay | Admitting: Emergency Medicine

## 2015-03-30 ENCOUNTER — Emergency Department (HOSPITAL_COMMUNITY)
Admission: EM | Admit: 2015-03-30 | Discharge: 2015-03-30 | Disposition: A | Discharge status: Home / Self Care | Payer: Self-pay | Attending: Emergency Medicine | Admitting: Emergency Medicine

## 2015-03-30 ENCOUNTER — Emergency Department (HOSPITAL_COMMUNITY): Payer: Self-pay

## 2015-03-30 DIAGNOSIS — K219 Gastro-esophageal reflux disease without esophagitis: Secondary | ICD-10-CM | POA: Insufficient documentation

## 2015-03-30 DIAGNOSIS — M546 Pain in thoracic spine: Secondary | ICD-10-CM

## 2015-03-30 DIAGNOSIS — J45909 Unspecified asthma, uncomplicated: Secondary | ICD-10-CM | POA: Insufficient documentation

## 2015-03-30 DIAGNOSIS — N644 Mastodynia: Secondary | ICD-10-CM | POA: Insufficient documentation

## 2015-03-30 DIAGNOSIS — Z3202 Encounter for pregnancy test, result negative: Secondary | ICD-10-CM | POA: Insufficient documentation

## 2015-03-30 DIAGNOSIS — R1012 Left upper quadrant pain: Secondary | ICD-10-CM | POA: Insufficient documentation

## 2015-03-30 DIAGNOSIS — Z79899 Other long term (current) drug therapy: Secondary | ICD-10-CM | POA: Insufficient documentation

## 2015-03-30 DIAGNOSIS — Z7951 Long term (current) use of inhaled steroids: Secondary | ICD-10-CM | POA: Insufficient documentation

## 2015-03-30 DIAGNOSIS — M542 Cervicalgia: Secondary | ICD-10-CM | POA: Insufficient documentation

## 2015-03-30 DIAGNOSIS — R109 Unspecified abdominal pain: Secondary | ICD-10-CM

## 2015-03-30 DIAGNOSIS — M1990 Unspecified osteoarthritis, unspecified site: Secondary | ICD-10-CM | POA: Insufficient documentation

## 2015-03-30 DIAGNOSIS — G43909 Migraine, unspecified, not intractable, without status migrainosus: Secondary | ICD-10-CM | POA: Insufficient documentation

## 2015-03-30 DIAGNOSIS — F419 Anxiety disorder, unspecified: Secondary | ICD-10-CM | POA: Insufficient documentation

## 2015-03-30 DIAGNOSIS — M545 Low back pain: Secondary | ICD-10-CM | POA: Insufficient documentation

## 2015-03-30 DIAGNOSIS — M5410 Radiculopathy, site unspecified: Secondary | ICD-10-CM

## 2015-03-30 DIAGNOSIS — R1032 Left lower quadrant pain: Secondary | ICD-10-CM | POA: Insufficient documentation

## 2015-03-30 DIAGNOSIS — F329 Major depressive disorder, single episode, unspecified: Secondary | ICD-10-CM | POA: Insufficient documentation

## 2015-03-30 LAB — COMPREHENSIVE METABOLIC PANEL
ALT: 24 U/L (ref 14–54)
ANION GAP: 9 (ref 5–15)
AST: 21 U/L (ref 15–41)
Albumin: 3.6 g/dL (ref 3.5–5.0)
Alkaline Phosphatase: 81 U/L (ref 38–126)
BUN: 9 mg/dL (ref 6–20)
CO2: 23 mmol/L (ref 22–32)
Calcium: 8.6 mg/dL — ABNORMAL LOW (ref 8.9–10.3)
Chloride: 107 mmol/L (ref 101–111)
Creatinine, Ser: 0.69 mg/dL (ref 0.44–1.00)
GLUCOSE: 176 mg/dL — AB (ref 65–99)
POTASSIUM: 3.5 mmol/L (ref 3.5–5.1)
SODIUM: 139 mmol/L (ref 135–145)
TOTAL PROTEIN: 7 g/dL (ref 6.5–8.1)
Total Bilirubin: 0.5 mg/dL (ref 0.3–1.2)

## 2015-03-30 LAB — URINALYSIS, ROUTINE W REFLEX MICROSCOPIC
BILIRUBIN URINE: NEGATIVE
Glucose, UA: NEGATIVE mg/dL
Hgb urine dipstick: NEGATIVE
Ketones, ur: 15 mg/dL — AB
NITRITE: NEGATIVE
Protein, ur: NEGATIVE mg/dL
SPECIFIC GRAVITY, URINE: 1.029 (ref 1.005–1.030)
UROBILINOGEN UA: 0.2 mg/dL (ref 0.0–1.0)
pH: 6 (ref 5.0–8.0)

## 2015-03-30 LAB — CBC
HEMATOCRIT: 34.7 % — AB (ref 36.0–46.0)
HEMOGLOBIN: 11.8 g/dL — AB (ref 12.0–15.0)
MCH: 28.3 pg (ref 26.0–34.0)
MCHC: 34 g/dL (ref 30.0–36.0)
MCV: 83.2 fL (ref 78.0–100.0)
Platelets: 301 10*3/uL (ref 150–400)
RBC: 4.17 MIL/uL (ref 3.87–5.11)
RDW: 12.7 % (ref 11.5–15.5)
WBC: 7.9 10*3/uL (ref 4.0–10.5)

## 2015-03-30 LAB — URINE MICROSCOPIC-ADD ON

## 2015-03-30 LAB — LIPASE, BLOOD: Lipase: 18 U/L — ABNORMAL LOW (ref 22–51)

## 2015-03-30 LAB — I-STAT BETA HCG BLOOD, ED (MC, WL, AP ONLY)

## 2015-03-30 MED ORDER — IOHEXOL 300 MG/ML  SOLN
100.0000 mL | Freq: Once | INTRAMUSCULAR | Status: AC | PRN
Start: 2015-03-30 — End: 2015-03-30
  Administered 2015-03-30: 100 mL via INTRAVENOUS

## 2015-03-30 NOTE — ED Provider Notes (Signed)
CSN: [id number]   Arrival date & time 03/30/15 0011  History  This chart was scribed for  Mirian Mo, MD by Bethel Born, ED Scribe. This patient was seen in room B15C/B15C and the patient's care was started at 3:17 AM.  Chief Complaint  Patient presents with  . Abdominal Pain  . Leg Pain    HPI The history is provided by the patient. No language interpreter was used.   [name] is a 34 y.o. female who presents to the Emergency Department complaining of intermittent left and right sided abdominal pain with onset 2 day ago. The pain waxes and wanes in intensity and is exacerbated by anger.She had similar pain in the past with gastritis.  Pt also complains of constant breast pain ("like I'm going to have a baby"), chronic back pain, dysuria (1 episode 4 days ago), and numbness in the right hand. No constipation, diarrhea, cough, vaginal bleeding, or abnormal vaginal discharge.   Past Medical History  Diagnosis Date  . Neck pain   . Migraines   . Depression   . Anxiety     relative to circumstances to back problems/injury, not taking any medicine, pt. reports has been crying a lot  . Asthma   . GERD (gastroesophageal reflux disease)   . Arthritis     low back    Past Surgical History  Procedure Laterality Date  . Lumbar laminectomy/decompression microdiscectomy  06/05/2012    Procedure: LUMBAR LAMINECTOMY/DECOMPRESSION MICRODISCECTOMY;  Surgeon: Emilee Hero, MD;  Location: [facility] OR;  Service: Orthopedics;  Laterality: Left;  Left sided lumbar 4-5 microdisectomy  . Childbirth      x4 vaginal     No family history on file.  History  Substance Use Topics  . Smoking status: Never Smoker   . Smokeless tobacco: Never Used  . Alcohol Use: No     Review of Systems  Respiratory: Negative for cough.   Gastrointestinal: Positive for abdominal pain.  Genitourinary: Positive for dysuria.  Musculoskeletal: Positive for back pain.       Breast pain  Neurological:        Numbness in the right hand  All other systems reviewed and are negative.   Home Medications   Prior to Admission medications   Medication Sig Start Date End Date Taking? Authorizing Provider  albuterol (PROVENTIL HFA;VENTOLIN HFA) 108 (90 BASE) MCG/ACT inhaler Inhale 1-2 puffs into the lungs every 6 (six) hours as needed for wheezing or shortness of breath. Patient not taking: Reported on 10/14/2014 05/24/14   Trixie Dredge, PA-C  azithromycin (ZITHROMAX) 250 MG tablet Take 1 tablet (250 mg total) by mouth daily. Take first 2 tablets together, then 1 every day until finished. Patient not taking: Reported on 03/30/2015 01/24/15   Antony Madura, PA-C  beclomethasone (QVAR) 40 MCG/ACT inhaler Inhale 2 puffs into the lungs 2 (two) times daily. Patient not taking: Reported on 10/14/2014 05/24/14   Trixie Dredge, PA-C  benzonatate (TESSALON) 100 MG capsule Take 1 capsule (100 mg total) by mouth every 8 (eight) hours. Patient not taking: Reported on 03/30/2015 01/24/15   Antony Madura, PA-C  DULoxetine (CYMBALTA) 30 MG capsule Take 1 capsule (30 mg total) by mouth daily. For depression and anxiety and pain. Patient not taking: Reported on 10/14/2014 06/03/13   Tamala Julian, PA-C  gabapentin (NEURONTIN) 600 MG tablet Take 1 tablet (600 mg total) by mouth 3 (three) times daily. For anxiety, neuropathic pain and depression. Patient not taking: Reported on 10/14/2014 06/03/13  Tamala JulianNeil T Mashburn, PA-C  lansoprazole (PREVACID) 30 MG capsule Take 1 capsule (30 mg total) by mouth daily at 12 noon. Patient not taking: Reported on 01/24/2015 10/14/14   Loren Raceravid Yelverton, MD  ondansetron (ZOFRAN ODT) 4 MG disintegrating tablet 4mg  ODT q4 hours prn nausea/vomit Patient not taking: Reported on 01/24/2015 10/14/14   Loren Raceravid Yelverton, MD  predniSONE (DELTASONE) 20 MG tablet Take 2 tablets (40 mg total) by mouth daily. Patient not taking: Reported on 03/30/2015 01/24/15   Antony MaduraKelly Humes, PA-C    Allergies  Hydrocodone-acetaminophen  Triage  Vitals: BP 131/81 mmHg  Pulse 66  Temp(Src) 98.1 F (36.7 C) (Oral)  Resp 16  SpO2 100%  Physical Exam  Constitutional: She is oriented to person, place, and time. She appears well-developed and well-nourished.  HENT:  Head: Normocephalic and atraumatic.  Right Ear: External ear normal.  Left Ear: External ear normal.  Eyes: Conjunctivae and EOM are normal. Pupils are equal, round, and reactive to light.  Neck: Normal range of motion. Neck supple.  Cardiovascular: Normal rate, regular rhythm, normal heart sounds and intact distal pulses.   Pulmonary/Chest: Effort normal and breath sounds normal. She exhibits tenderness.  Abdominal: Soft. Bowel sounds are normal. There is tenderness in the left upper quadrant and left lower quadrant.  Musculoskeletal: Normal range of motion.       Cervical back: She exhibits tenderness.       Thoracic back: She exhibits tenderness.       Lumbar back: She exhibits tenderness.  Neurological: [name] and oriented to person, place, and time.  Skin: Skin is warm and dry.  Vitals reviewed.   ED Course  Procedures   DIAGNOSTIC STUDIES: Oxygen Saturation is 100% on RA, normal by my interpretation.    COORDINATION OF CARE: 3:27 AM Discussed treatment plan which includes lab work, CT A/P with contrast, and EKG with pt at bedside and pt agreed to plan.  Labs Review-  Labs Reviewed  LIPASE, BLOOD - Abnormal; Notable for the following:    Lipase 18 (*)    All other components within normal limits  COMPREHENSIVE METABOLIC PANEL - Abnormal; Notable for the following:    Glucose, Bld 176 (*)    Calcium 8.6 (*)    All other components within normal limits  CBC - Abnormal; Notable for the following:    Hemoglobin 11.8 (*)    HCT 34.7 (*)    All other components within normal limits  URINALYSIS, ROUTINE W REFLEX MICROSCOPIC (NOT [facility]) - Abnormal; Notable for the following:    Color, Urine AMBER (*)    APPearance CLOUDY (*)    Ketones, ur 15  (*)    Leukocytes, UA TRACE (*)    All other components within normal limits  URINE MICROSCOPIC-ADD ON - Abnormal; Notable for the following:    Squamous Epithelial / LPF MANY (*)    Bacteria, UA MANY (*)    All other components within normal limits  I-STAT BETA HCG BLOOD, ED ([facility], [facility], [facility] ONLY)    Imaging Review Ct Abdomen Pelvis W Contrast  03/30/2015   CLINICAL DATA:  Acute onset of bilateral abdominal pain and upper back pain. Bilateral leg pain. Nausea. Initial encounter.  EXAM: CT ABDOMEN AND PELVIS WITH CONTRAST  TECHNIQUE: Multidetector CT imaging of the abdomen and pelvis was performed using the standard protocol following bolus administration of intravenous contrast.  CONTRAST:  100mL OMNIPAQUE IOHEXOL 300 MG/ML  SOLN  COMPARISON:  MRI of the lumbar spine  performed 05/27/2013, and CT of the abdomen and pelvis from 01/24/2012  FINDINGS: The visualized lung bases are clear.  The liver and spleen are unremarkable in appearance. The gallbladder is within normal limits. The pancreas and adrenal glands are unremarkable.  Foci of increased attenuation at the upper poles of the renal calyces are thought to reflect trace contrast. No definite renal or ureteral stones are seen. No perinephric stranding is appreciated. There is no evidence of hydronephrosis.  No free fluid is identified. The small bowel is unremarkable in appearance. The stomach is within normal limits. No acute vascular abnormalities are seen.  The appendix is normal in caliber, without evidence of appendicitis. The colon is unremarkable in appearance.  The bladder is largely decompressed and not well assessed. The uterus is unremarkable in appearance. The ovaries are grossly symmetric. No suspicious adnexal masses are seen. No inguinal lymphadenopathy is seen.  No acute osseous abnormalities are identified. The patient is status post lumbar spinal fusion at L4-L5.  IMPRESSION: No acute abnormality seen within the abdomen or pelvis.    Electronically Signed   By: Roanna Raider M.D.   On: 03/30/2015 05:34    EKG Interpretation  Date/Time:  Wednesday March 30 2015 00:36:57 EDT Ventricular Rate:  81 PR Interval:  156 QRS Duration: 76 QT Interval:  338 QTC Calculation: 392 R Axis:   101 Text Interpretation:  Normal sinus rhythm Possible Right ventricular hypertrophy Abnormal ECG No significant change since last tracing Confirmed by Mirian Mo [phone]) on 03/30/2015 3:52:54 AM       MDM   Final diagnoses:  Abdominal pain, acute  Bilateral thoracic back pain  Radicular pain     34 y.o. female with pertinent PMH of chronic back pain, anxiety presents with chest, back, and abd pain as above.  Pt has reproduction of pain with palpation of chest wall, no rash.  No dysuria, fever.  Abd exam as above.    Wu as above unremarkable.  Symptoms improved spontaneously.  Doubt ACS, PE, Nephrolithiasis, pyelonephritis, or other emergent pathology given clinical features and exam.  Likely chronic pain related to radicular pain from back.  Standard return precautions given.  DC home in stable condition  I have reviewed all laboratory and imaging studies if ordered as above  1. Abdominal pain, acute   2. Bilateral thoracic back pain   3. Radicular pain          Mirian Mo, MD 03/30/15 [phone]

## 2015-03-30 NOTE — ED Notes (Signed)
MD at bedside. 

## 2015-03-30 NOTE — Discharge Instructions (Signed)
Dolor abdominal °(Abdominal Pain) °El dolor puede tener muchas causas. Normalmente la causa del dolor abdominal no es una enfermedad y mejorará sin tratamiento. Frecuentemente puede controlarse y tratarse en casa. Su médico le realizará un examen físico y posiblemente solicite análisis de sangre y radiografías para ayudar a determinar la gravedad de su dolor. Sin embargo, en muchos casos, debe transcurrir más tiempo antes de que se pueda encontrar una causa evidente del dolor. Antes de llegar a ese punto, es posible que su médico no sepa si necesita más pruebas o un tratamiento más profundo. °INSTRUCCIONES PARA EL CUIDADO EN EL HOGAR  °Esté atento al dolor para ver si hay cambios. Las siguientes indicaciones ayudarán a aliviar cualquier molestia que pueda sentir: °· Tome solo medicamentos de venta libre o recetados, según las indicaciones del médico. °· No tome laxantes a menos que se lo haya indicado su médico. °· Pruebe con una dieta líquida absoluta (caldo, té o agua) según se lo indique su médico. Introduzca gradualmente una dieta normal, según su tolerancia. °SOLICITE ATENCIÓN MÉDICA SI: °· Tiene dolor abdominal sin explicación. °· Tiene dolor abdominal relacionado con náuseas o diarrea. °· Tiene dolor cuando orina o defeca. °· Experimenta dolor abdominal que lo despierta de noche. °· Tiene dolor abdominal que empeora o mejora cuando come alimentos. °· Tiene dolor abdominal que empeora cuando come alimentos grasosos. °· Tiene fiebre. °SOLICITE ATENCIÓN MÉDICA DE INMEDIATO SI:  °· El dolor no desaparece en un plazo máximo de 2 horas. °· No deja de (vomitar). °· El dolor se siente solo en partes del abdomen, como el lado derecho o la parte inferior izquierda del abdomen. °· Evacúa materia fecal sanguinolenta o negra, de aspecto alquitranado. °ASEGÚRESE DE QUE: °· Comprende estas instrucciones. °· Controlará su afección. °· Recibirá ayuda de inmediato si no mejora o si empeora. °Document Released: 08/27/2005  Document Revised: 09/01/2013 °ExitCare® Patient Information ©2015 [employer]. This information is not intended to replace advice given to you by your health care provider. Make sure you discuss any questions you have with your health care provider. ° °

## 2015-03-30 NOTE — ED Notes (Signed)
Pt. reports left/right lateral  abdominal pain , upper back pain and bilateral leg pain onset yesterday with nausea , denies emesis or diarrhea . No fever or chills.

## 2016-04-23 ENCOUNTER — Ambulatory Visit (INDEPENDENT_AMBULATORY_CARE_PROVIDER_SITE_OTHER): Payer: Self-pay | Admitting: Licensed Clinical Social Worker

## 2016-04-23 DIAGNOSIS — Z609 Problem related to social environment, unspecified: Secondary | ICD-10-CM

## 2016-04-23 DIAGNOSIS — F439 Reaction to severe stress, unspecified: Secondary | ICD-10-CM

## 2016-04-23 DIAGNOSIS — Z658 Other specified problems related to psychosocial circumstances: Secondary | ICD-10-CM

## 2016-04-23 DIAGNOSIS — Z659 Problem related to unspecified psychosocial circumstances: Secondary | ICD-10-CM
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
## 2016-05-07 ENCOUNTER — Ambulatory Visit (INDEPENDENT_AMBULATORY_CARE_PROVIDER_SITE_OTHER): Payer: Self-pay | Admitting: Licensed Clinical Social Worker

## 2016-05-07 DIAGNOSIS — Z658 Other specified problems related to psychosocial circumstances: Secondary | ICD-10-CM

## 2016-05-07 DIAGNOSIS — F439 Reaction to severe stress, unspecified: Secondary | ICD-10-CM

## 2016-05-08 NOTE — Progress Notes (Signed)
   THERAPY PROGRESS NOTE  Session Time: 60min  Participation Level: Active  Behavioral Response: CasualAlertDepressed  Type of Therapy: Individual Therapy  Treatment Goals addressed: Coping  Interventions: Supportive  Summary: Shea StakesOsbelia Casas is a 35 y.o. female who presents with a depressed mood and appropriate affect. She shared that the mediation meeting with her landlord went okay but that he did not accept her request of a year's rent for her to move out. She reported that they will now have to go through a court process to resolve the matter. Arneisha expressed confusion and ambivalence regarding her partner's desire to have a child. She shared that she already feels overwhelmed having four children, and she knows that her partner will not do much of the childrearing for a new baby. She expressed fears that they will have a baby together and then he will leave her. She struggled to identify reasons that she would like to have another child, instead only listing reasons that she did not want to. She reported that she will talk with her partner honestly about the issue.    Suicidal/Homicidal: Nowithout intent/plan  Therapist Response: LCSW utilized supportive counseling techniques throughout the session in order to validate emotions and encourage open expression of emotion. LCSW and Obselia processed about her feelings regarding having another child. LCSW asked her to list the reasons for and against; LCSW reflected on the lack of reasons for having a child. LCSW encouraged her to talk honestly with her partner.  Plan: Return again in 2 weeks.  Diagnosis: Axis I: See current hospital problem list    Axis II: No diagnosis    Nilda Simmeratosha Jaythan Hinely, LCSW 05/08/2016

## 2016-05-22 ENCOUNTER — Other Ambulatory Visit: Payer: Self-pay | Admitting: Licensed Clinical Social Worker

## 2016-06-01 ENCOUNTER — Other Ambulatory Visit: Payer: Self-pay | Admitting: Licensed Clinical Social Worker

## 2016-06-15 ENCOUNTER — Other Ambulatory Visit: Payer: Self-pay | Admitting: Licensed Clinical Social Worker

## 2016-08-13 ENCOUNTER — Other Ambulatory Visit: Payer: Self-pay | Admitting: Licensed Clinical Social Worker

## 2016-08-14 ENCOUNTER — Other Ambulatory Visit: Payer: Self-pay | Admitting: Licensed Clinical Social Worker

## 2016-09-29 ENCOUNTER — Emergency Department (HOSPITAL_COMMUNITY)
Admission: EM | Admit: 2016-09-29 | Discharge: 2016-09-29 | Disposition: A | Discharge status: Home / Self Care | Payer: Self-pay | Attending: Emergency Medicine | Admitting: Emergency Medicine

## 2016-09-29 ENCOUNTER — Emergency Department (HOSPITAL_COMMUNITY): Payer: Self-pay

## 2016-09-29 ENCOUNTER — Encounter (HOSPITAL_COMMUNITY): Payer: Self-pay | Admitting: Emergency Medicine

## 2016-09-29 DIAGNOSIS — J111 Influenza due to unidentified influenza virus with other respiratory manifestations: Secondary | ICD-10-CM | POA: Insufficient documentation

## 2016-09-29 DIAGNOSIS — J45909 Unspecified asthma, uncomplicated: Secondary | ICD-10-CM | POA: Insufficient documentation

## 2016-09-29 DIAGNOSIS — R69 Illness, unspecified: Secondary | ICD-10-CM

## 2016-09-29 LAB — CBC WITH DIFFERENTIAL/PLATELET
BASOS ABS: 0 10*3/uL (ref 0.0–0.1)
Basophils Relative: 0 %
EOS PCT: 3 %
Eosinophils Absolute: 0.2 10*3/uL (ref 0.0–0.7)
HEMATOCRIT: 35.8 % — AB (ref 36.0–46.0)
Hemoglobin: 11.9 g/dL — ABNORMAL LOW (ref 12.0–15.0)
Lymphocytes Relative: 34 %
Lymphs Abs: 2.3 10*3/uL (ref 0.7–4.0)
MCH: 29.1 pg (ref 26.0–34.0)
MCHC: 33.2 g/dL (ref 30.0–36.0)
MCV: 87.5 fL (ref 78.0–100.0)
MONOS PCT: 7 %
Monocytes Absolute: 0.5 10*3/uL (ref 0.1–1.0)
NEUTROS ABS: 3.8 10*3/uL (ref 1.7–7.7)
Neutrophils Relative %: 56 %
PLATELETS: 269 10*3/uL (ref 150–400)
RBC: 4.09 MIL/uL (ref 3.87–5.11)
RDW: 12.5 % (ref 11.5–15.5)
WBC: 6.7 10*3/uL (ref 4.0–10.5)

## 2016-09-29 LAB — BASIC METABOLIC PANEL
Anion gap: 6 (ref 5–15)
BUN: 7 mg/dL (ref 6–20)
CO2: 25 mmol/L (ref 22–32)
Calcium: 8.4 mg/dL — ABNORMAL LOW (ref 8.9–10.3)
Chloride: 107 mmol/L (ref 101–111)
Creatinine, Ser: 0.69 mg/dL (ref 0.44–1.00)
GFR calc Af Amer: >60 mL/min (ref >60)
GFR calc non Af Amer: >60 mL/min (ref >60)
Glucose, Bld: 99 mg/dL (ref 65–99)
Potassium: 4.1 mmol/L (ref 3.5–5.1)
Sodium: 138 mmol/L (ref 135–145)

## 2016-09-29 MED ORDER — IBUPROFEN 200 MG PO TABS
600.0000 mg | ORAL_TABLET | Freq: Once | ORAL | Status: AC
  Administered 2016-09-29: 600 mg via ORAL
  Filled 2016-09-29: qty 1

## 2016-09-29 MED ORDER — NAPROXEN 500 MG PO TABS: 500.0000 mg | ORAL_TABLET | Freq: Two times a day (BID) | ORAL | 0 refills | Status: DC

## 2016-09-29 MED ORDER — BENZONATATE 100 MG PO CAPS: 100.0000 mg | ORAL_CAPSULE | Freq: Three times a day (TID) | ORAL | 0 refills | Status: DC

## 2016-09-29 MED ORDER — ALBUTEROL SULFATE HFA 108 (90 BASE) MCG/ACT IN AERS
2.0000 | INHALATION_SPRAY | Freq: Once | RESPIRATORY_TRACT | Status: AC
  Administered 2016-09-29: 2 via RESPIRATORY_TRACT
  Filled 2016-09-29: qty 6.7

## 2016-09-29 MED ORDER — PSEUDOEPHEDRINE HCL ER 120 MG PO TB12: 120.0000 mg | ORAL_TABLET | Freq: Two times a day (BID) | ORAL | 0 refills | Status: DC

## 2016-09-29 NOTE — ED Notes (Signed)
Patient transported to X-ray 

## 2016-09-29 NOTE — ED Provider Notes (Signed)
[facility]-EMERGENCY DEPT Provider Note   CSN: [id number] Arrival date & time: 09/29/16  1223     History   Chief Complaint Chief Complaint  Patient presents with  . Nasal Congestion  . Headache  . Fever    HPI [name] is a 36 y.o. female.  HPI Patient presents to the emergency room with complaints of cough, congestion and URI symptoms. Symptoms started about a week or 2 ago. Has had persistent cough and congestion. She has had a headache. She has felt feverish as well. The last time she thinks she had a fever was on Thursday. She's had diffuse body aches. She denies any vomiting but has had some loose stools. She denies any dysuria. No neck stiffness no rashes. Past Medical History:  Diagnosis Date  . Anxiety    relative to circumstances to back problems/injury, not taking any medicine, pt. reports has been crying a lot  . Arthritis    low back  . Asthma   . Depression   . GERD (gastroesophageal reflux disease)   . Migraines   . Neck pain     Patient Active Problem List   Diagnosis Date Noted  . PTSD (post-traumatic stress disorder) 05/29/2013  . Depression with suicidal ideation 05/29/2013  . Suicide attempt by multiple drug overdose (HCC) 05/29/2013  . Chronic pain following surgery or procedure 05/29/2013  . HNP (herniated nucleus pulposus), lumbar 02/04/2013    Past Surgical History:  Procedure Laterality Date  . childbirth     x4 vaginal   . LUMBAR LAMINECTOMY/DECOMPRESSION MICRODISCECTOMY  06/05/2012   Procedure: LUMBAR LAMINECTOMY/DECOMPRESSION MICRODISCECTOMY;  Surgeon: Emilee Hero, MD;  Location: [facility] OR;  Service: Orthopedics;  Laterality: Left;  Left sided lumbar 4-5 microdisectomy    OB History    No data available       Home Medications    Prior to Admission medications   Medication Sig Start Date End Date Taking? Authorizing Provider  albuterol (PROVENTIL HFA;VENTOLIN HFA) 108 (90 BASE) MCG/ACT inhaler Inhale 1-2 puffs into the lungs  every 6 (six) hours as needed for wheezing or shortness of breath. Patient not taking: Reported on 10/14/2014 05/24/14   Trixie Dredge, PA-C  azithromycin (ZITHROMAX) 250 MG tablet Take 1 tablet (250 mg total) by mouth daily. Take first 2 tablets together, then 1 every day until finished. Patient not taking: Reported on 03/30/2015 01/24/15   Antony Madura, PA-C  beclomethasone (QVAR) 40 MCG/ACT inhaler Inhale 2 puffs into the lungs 2 (two) times daily. Patient not taking: Reported on 10/14/2014 05/24/14   Trixie Dredge, PA-C  benzonatate (TESSALON) 100 MG capsule Take 1 capsule (100 mg total) by mouth every 8 (eight) hours. 09/29/16   Linwood Dibbles, MD  DULoxetine (CYMBALTA) 30 MG capsule Take 1 capsule (30 mg total) by mouth daily. For depression and anxiety and pain. Patient not taking: Reported on 10/14/2014 06/03/13   Tamala Julian, PA-C  gabapentin (NEURONTIN) 600 MG tablet Take 1 tablet (600 mg total) by mouth 3 (three) times daily. For anxiety, neuropathic pain and depression. Patient not taking: Reported on 10/14/2014 06/03/13   Tamala Julian, PA-C  lansoprazole (PREVACID) 30 MG capsule Take 1 capsule (30 mg total) by mouth daily at 12 noon. Patient not taking: Reported on 01/24/2015 10/14/14   Loren Racer, MD  naproxen (NAPROSYN) 500 MG tablet Take 1 tablet (500 mg total) by mouth 2 (two) times daily. 09/29/16   Linwood Dibbles, MD  ondansetron (ZOFRAN ODT) 4 MG disintegrating tablet 4mg  ODT  q4 hours prn nausea/vomit Patient not taking: Reported on 01/24/2015 10/14/14   Loren Raceravid Yelverton, MD  predniSONE (DELTASONE) 20 MG tablet Take 2 tablets (40 mg total) by mouth daily. Patient not taking: Reported on 03/30/2015 01/24/15   Antony MaduraKelly Humes, PA-C  pseudoephedrine (SUDAFED 12 HOUR) 120 MG 12 hr tablet Take 1 tablet (120 mg total) by mouth every 12 (twelve) hours. 09/29/16   Linwood DibblesJon Daisee Centner, MD    Family History No family history on file.  Social History Social History  Substance Use Topics  . Smoking status: Never Smoker  .  Smokeless tobacco: Never Used  . Alcohol use No     Allergies   Hydrocodone-acetaminophen   Review of Systems Review of Systems  All other systems reviewed and are negative.    Physical Exam Updated Vital Signs BP 111/81   Pulse 73   Temp 98.2 F (36.8 C) (Oral)   Resp 18   Ht 5\' 4"  (1.626 m)   Wt 84.8 kg   LMP 09/29/2016   SpO2 99%   BMI 32.10 kg/m   Physical Exam  Constitutional: She appears well-developed and well-nourished. No distress.  HENT:  Head: Normocephalic and atraumatic.  Right Ear: External ear normal.  Left Ear: External ear normal.  Sinus congestion  Eyes: Conjunctivae are normal. Right eye exhibits no discharge. Left eye exhibits no discharge. No scleral icterus.  Neck: Neck supple. No tracheal deviation present.  Cardiovascular: Normal rate, regular rhythm and intact distal pulses.   Pulmonary/Chest: Effort normal and breath sounds normal. No stridor. No respiratory distress. She has no wheezes. She has no rales.  Frequent Coughing  Abdominal: Soft. Bowel sounds are normal. She exhibits no distension. There is no tenderness. There is no rebound and no guarding.  Musculoskeletal: She exhibits no edema or tenderness.  Neurological: [name]. She has normal strength. No cranial nerve deficit (no facial droop, extraocular movements intact, no slurred speech) or sensory deficit. She exhibits normal muscle tone. She displays no seizure activity. Coordination normal.  Skin: Skin is warm and dry. No rash noted.  Psychiatric: She has a normal mood and affect.  Nursing note and vitals reviewed.    ED Treatments / Results  Labs (all labs ordered are listed, but only abnormal results are displayed) Labs Reviewed  CBC WITH DIFFERENTIAL/PLATELET - Abnormal; Notable for the following:       Result Value   Hemoglobin 11.9 (*)    HCT 35.8 (*)    All other components within normal limits  BASIC METABOLIC PANEL - Abnormal; Notable for the following:     Calcium 8.4 (*)    All other components within normal limits     Radiology Dg Chest 2 View  Result Date: 09/29/2016 CLINICAL DATA:  Cough and fever for 2 weeks.  Asthma. EXAM: CHEST  2 VIEW COMPARISON:  01/24/2015 FINDINGS: The heart size and mediastinal contours are within normal limits. Both lungs are clear. The visualized skeletal structures are unremarkable. IMPRESSION: No active cardiopulmonary disease. Electronically Signed   By: Myles RosenthalJohn  Stahl M.D.   On: 09/29/2016 17:49    Procedures Procedures (including critical care time)  Medications Ordered in ED Medications  ibuprofen (ADVIL,MOTRIN) tablet 600 mg (not administered)  albuterol (PROVENTIL HFA;VENTOLIN HFA) 108 (90 Base) MCG/ACT inhaler 2 puff (2 puffs Inhalation Given 09/29/16 1724)     Initial Impression / Assessment and Plan / ED Course  I have reviewed the triage vital signs and the nursing notes.  Pertinent labs & imaging  results that were available during my care of the patient were reviewed by me and considered in my medical decision making (see chart for details).    Laboratory tests are reassuring. Chest x-ray does not show pneumonia. Symptoms are consistent with an influenza-like illness. Plan on discharge home with supportive medications. Follow-up with her doctor next week.  Final Clinical Impressions(s) / ED Diagnoses   Final diagnoses:  Influenza-like illness    New Prescriptions New Prescriptions   BENZONATATE (TESSALON) 100 MG CAPSULE    Take 1 capsule (100 mg total) by mouth every 8 (eight) hours.   NAPROXEN (NAPROSYN) 500 MG TABLET    Take 1 tablet (500 mg total) by mouth 2 (two) times daily.   PSEUDOEPHEDRINE (SUDAFED 12 HOUR) 120 MG 12 HR TABLET    Take 1 tablet (120 mg total) by mouth every 12 (twelve) hours.     Linwood Dibbles, MD 09/29/16 [phone]

## 2016-09-29 NOTE — ED Triage Notes (Signed)
Pt. Ststed, I've had a cold for about 2 weeks with a headache and Im no better, I feel worse.

## 2016-09-29 NOTE — Discharge Instructions (Signed)
Take the medications as prescribed, follow up with your medical doctor next week if the symptoms have not resolved, return as needed for worsening symptoms

## 2017-02-26 ENCOUNTER — Ambulatory Visit (HOSPITAL_COMMUNITY)
Admission: EM | Admit: 2017-02-26 | Discharge: 2017-02-26 | Disposition: A | Discharge status: Home / Self Care | Payer: Self-pay | Attending: Internal Medicine | Admitting: Internal Medicine

## 2017-02-26 ENCOUNTER — Encounter (HOSPITAL_COMMUNITY): Payer: Self-pay | Admitting: Family Medicine

## 2017-02-26 DIAGNOSIS — S61011A Laceration without foreign body of right thumb without damage to nail, initial encounter: Secondary | ICD-10-CM

## 2017-02-26 DIAGNOSIS — W260XXA Contact with knife, initial encounter: Secondary | ICD-10-CM

## 2017-02-26 DIAGNOSIS — Z23 Encounter for immunization: Secondary | ICD-10-CM

## 2017-02-26 MED ORDER — TETANUS-DIPHTH-ACELL PERTUSSIS 5-2.5-18.5 LF-MCG/0.5 IM SUSP
INTRAMUSCULAR | Status: AC
  Filled 2017-02-26: qty 0.5

## 2017-02-26 MED ORDER — DOXYCYCLINE HYCLATE 100 MG PO CAPS: 100.0000 mg | ORAL_CAPSULE | Freq: Two times a day (BID) | ORAL | 0 refills | Status: DC

## 2017-02-26 MED ORDER — TETANUS-DIPHTH-ACELL PERTUSSIS 5-2.5-18.5 LF-MCG/0.5 IM SUSP
0.5000 mL | Freq: Once | INTRAMUSCULAR | Status: AC
  Administered 2017-02-26: 0.5 mL via INTRAMUSCULAR

## 2017-02-26 NOTE — ED Triage Notes (Signed)
Pt here for laceration to the right thumb from a knife. Unknown last tetanus.

## 2017-02-26 NOTE — Discharge Instructions (Signed)
Keep your wound is clean, dry, and covered. 6 sutures have been placed in your thumb, monitor for signs or symptoms of infection, return to clinic in one week for removal

## 2017-02-26 NOTE — ED Provider Notes (Signed)
CSN: [id number]     Arrival date & time 02/26/17  1848 History   First MD Initiated Contact with Patient 02/26/17 1909     Chief Complaint  Patient presents with  . Laceration   (Consider location/radiation/quality/duration/timing/severity/associated sxs/prior Treatment) [name] is a 36 y.o. female who presents to the [facility] urgent care with a chief complaint of laceration to the right thumb that occurred earlier today with a razor knife   The history is provided by the patient.  Laceration  Location:  Finger Finger laceration location:  R thumb Length:  3 cm Depth:  Through dermis Quality: straight   Bleeding: uncontrolled   Time since incident:  3 hours Laceration mechanism:  Knife and razor Pain details:    Quality:  Aching, dull and sharp   Severity:  Moderate   Timing:  Constant   Progression:  Unchanged Foreign body present:  No foreign bodies Relieved by:  None tried Worsened by:  Nothing Ineffective treatments:  None tried Tetanus status:  Out of date Associated symptoms: no fever, no focal weakness, no numbness, no rash, no redness, no swelling and no streaking     Past Medical History:  Diagnosis Date  . Anxiety    relative to circumstances to back problems/injury, not taking any medicine, pt. reports has been crying a lot  . Arthritis    low back  . Asthma   . Depression   . GERD (gastroesophageal reflux disease)   . Migraines   . Neck pain    Past Surgical History:  Procedure Laterality Date  . childbirth     x4 vaginal   . LUMBAR LAMINECTOMY/DECOMPRESSION MICRODISCECTOMY  06/05/2012   Procedure: LUMBAR LAMINECTOMY/DECOMPRESSION MICRODISCECTOMY;  Surgeon: Emilee Hero, MD;  Location: [facility] OR;  Service: Orthopedics;  Laterality: Left;  Left sided lumbar 4-5 microdisectomy   History reviewed. No pertinent family history. Social History  Substance Use Topics  . Smoking status: Never Smoker  . Smokeless tobacco: Never Used  . Alcohol  use No   OB History    No data available     Review of Systems  Constitutional: Negative for chills and fever.  HENT: Negative.   Respiratory: Negative.   Cardiovascular: Negative.   Gastrointestinal: Negative.   Musculoskeletal: Negative.   Skin: Positive for wound. Negative for rash.  Neurological: Negative.  Negative for focal weakness.    Allergies  Hydrocodone-acetaminophen  Home Medications   Prior to Admission medications   Medication Sig Start Date End Date Taking? Authorizing Provider  albuterol (PROVENTIL HFA;VENTOLIN HFA) 108 (90 BASE) MCG/ACT inhaler Inhale 1-2 puffs into the lungs every 6 (six) hours as needed for wheezing or shortness of breath. Patient not taking: Reported on 10/14/2014 05/24/14   Trixie Dredge, PA-C  azithromycin (ZITHROMAX) 250 MG tablet Take 1 tablet (250 mg total) by mouth daily. Take first 2 tablets together, then 1 every day until finished. Patient not taking: Reported on 03/30/2015 01/24/15   Antony Madura, PA-C  beclomethasone (QVAR) 40 MCG/ACT inhaler Inhale 2 puffs into the lungs 2 (two) times daily. Patient not taking: Reported on 10/14/2014 05/24/14   Trixie Dredge, PA-C  benzonatate (TESSALON) 100 MG capsule Take 1 capsule (100 mg total) by mouth every 8 (eight) hours. 09/29/16   Linwood Dibbles, MD  doxycycline (VIBRAMYCIN) 100 MG capsule Take 1 capsule (100 mg total) by mouth 2 (two) times daily. 02/26/17   Dorena Bodo, NP  DULoxetine (CYMBALTA) 30 MG capsule Take 1 capsule (30 mg total)  by mouth daily. For depression and anxiety and pain. Patient not taking: Reported on 10/14/2014 06/03/13   Verne Spurr T, PA-C  gabapentin (NEURONTIN) 600 MG tablet Take 1 tablet (600 mg total) by mouth 3 (three) times daily. For anxiety, neuropathic pain and depression. Patient not taking: Reported on 10/14/2014 06/03/13   Verne Spurr T, PA-C  lansoprazole (PREVACID) 30 MG capsule Take 1 capsule (30 mg total) by mouth daily at 12 noon. Patient not taking:  Reported on 01/24/2015 10/14/14   Loren Racer, MD  naproxen (NAPROSYN) 500 MG tablet Take 1 tablet (500 mg total) by mouth 2 (two) times daily. 09/29/16   Linwood Dibbles, MD  ondansetron (ZOFRAN ODT) 4 MG disintegrating tablet 4mg  ODT q4 hours prn nausea/vomit Patient not taking: Reported on 01/24/2015 10/14/14   Loren Racer, MD  predniSONE (DELTASONE) 20 MG tablet Take 2 tablets (40 mg total) by mouth daily. Patient not taking: Reported on 03/30/2015 01/24/15   Antony Madura, PA-C  pseudoephedrine (SUDAFED 12 HOUR) 120 MG 12 hr tablet Take 1 tablet (120 mg total) by mouth every 12 (twelve) hours. 09/29/16   Linwood Dibbles, MD   Meds Ordered and Administered this Visit   Medications  Tdap (BOOSTRIX) injection 0.5 mL (0.5 mLs Intramuscular Given 02/26/17 1954)    BP 122/84   Pulse 80   Resp 18   SpO2 96%  No data found.   Physical Exam  Constitutional: She is oriented to person, place, and time. She appears well-developed and well-nourished. No distress.  HENT:  Head: Normocephalic and atraumatic.  Right Ear: External ear normal.  Left Ear: External ear normal.  Eyes: Conjunctivae are normal.  Neck: Normal range of motion.  Musculoskeletal:       Hands: Laceration to the right thumb pulse, motor, sensory function remain intact distally, capillary refill less than 2 seconds, no difficulty or abnormality with range of motion  Neurological: [name] and oriented to person, place, and time.  Skin: Skin is warm and dry. Capillary refill takes less than 2 seconds. Laceration noted. No rash noted. She is not diaphoretic. No erythema.  Psychiatric: She has a normal mood and affect. Her behavior is normal.  Nursing note and vitals reviewed.   Urgent Care Course     .Marland KitchenLaceration Repair Date/Time: 02/26/2017 8:37 PM Performed by: Dorena Bodo Authorized by: Eustace Moore   Consent:    Consent obtained:  Verbal   Consent given by:  Patient   Risks discussed:  Infection, pain, poor  cosmetic result, retained foreign body and poor wound healing Anesthesia (see MAR for exact dosages):    Anesthesia method:  Nerve block   Block needle gauge:  27 G   Block anesthetic:  Lidocaine 1% w/o epi   Block technique:  Digital block   Block injection procedure:  Anatomic landmarks identified, introduced needle, negative aspiration for blood and incremental injection   Block outcome:  Anesthesia achieved Laceration details:    Location:  Finger   Finger location:  R thumb   Length (cm):  3   Depth (mm):  3 Repair type:    Repair type:  Simple Pre-procedure details:    Preparation:  Patient was prepped and draped in usual sterile fashion Exploration:    Hemostasis achieved with:  Direct pressure   Wound exploration: wound explored through full range of motion and entire depth of wound probed and visualized     Wound extent: no areolar tissue violation noted, no fascia violation noted, no  foreign bodies/material noted, no nerve damage noted, no tendon damage noted and no vascular damage noted   Treatment:    Area cleansed with:  Betadine and saline   Amount of cleaning:  Standard   Irrigation method:  Syringe   Visualized foreign bodies/material removed: no   Skin repair:    Repair method:  Sutures   Suture size:  4-0   Suture material:  Prolene   Suture technique:  Simple interrupted   Number of sutures:  6 Approximation:    Approximation:  Close Post-procedure details:    Dressing:  Antibiotic ointment and non-adherent dressing   Patient tolerance of procedure:  Tolerated well, no immediate complications   (including critical care time)  Labs Review Labs Reviewed - No data to display  Imaging Review No results found.     MDM   1. Laceration of right thumb without damage to nail, foreign body presence unspecified, initial encounter     Laceration to the right thumb, wound was cleaned, explored through the full range of motion, no foreign bodies visualized,  digital block performed, 6 simple sutures were placed, provided wound care guidelines and follow-up guidelines, tetanus vaccine updated, return to clinic in 7-10 days for removal    Dorena BodoKennard, Huldah Marin, NP 02/26/17 2041

## 2017-05-26 ENCOUNTER — Encounter (HOSPITAL_COMMUNITY): Payer: Self-pay | Admitting: Emergency Medicine

## 2017-05-26 ENCOUNTER — Emergency Department (HOSPITAL_COMMUNITY)
Admission: EM | Admit: 2017-05-26 | Discharge: 2017-05-26 | Disposition: A | Discharge status: Home / Self Care | Payer: Self-pay | Attending: Emergency Medicine | Admitting: Emergency Medicine

## 2017-05-26 DIAGNOSIS — J45909 Unspecified asthma, uncomplicated: Secondary | ICD-10-CM | POA: Insufficient documentation

## 2017-05-26 DIAGNOSIS — Z79899 Other long term (current) drug therapy: Secondary | ICD-10-CM | POA: Insufficient documentation

## 2017-05-26 DIAGNOSIS — L239 Allergic contact dermatitis, unspecified cause: Secondary | ICD-10-CM | POA: Insufficient documentation

## 2017-05-26 MED ORDER — DIPHENHYDRAMINE HCL 25 MG PO CAPS
25.0000 mg | ORAL_CAPSULE | Freq: Once | ORAL | Status: AC
  Administered 2017-05-26: 25 mg via ORAL
  Filled 2017-05-26: qty 1

## 2017-05-26 MED ORDER — PREDNISONE 20 MG PO TABS
60.0000 mg | ORAL_TABLET | Freq: Once | ORAL | Status: AC
Start: 2017-05-26 — End: 2017-05-26
  Administered 2017-05-26: 60 mg via ORAL
  Filled 2017-05-26: qty 3

## 2017-05-26 MED ORDER — DIPHENHYDRAMINE HCL 25 MG PO CAPS: ORAL_CAPSULE | ORAL | 0 refills | Status: DC

## 2017-05-26 MED ORDER — PREDNISONE 20 MG PO TABS: 60.0000 mg | ORAL_TABLET | Freq: Every day | ORAL | 0 refills | Status: DC

## 2017-05-26 NOTE — ED Provider Notes (Signed)
[facility]-EMERGENCY DEPT Provider Note   CSN: [id number] Arrival date & time: 05/26/17  0049     History   Chief Complaint Chief Complaint  Patient presents with  . Allergic Reaction    HPI [name] is a 36 y.o. female.  Patient presents with pruritic rash that started earlier today on right UE, progressing to full facial area by this evening. No known allergen contacts. 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 No SOB, difficulty swallowing or tongue swelling. She has not taken anything to help with symptoms.    The history is provided by the patient. No language interpreter was used.  Allergic Reaction  Presenting symptoms: rash   Presenting symptoms: no difficulty swallowing     Past Medical History:  Diagnosis Date  . Anxiety    relative to circumstances to back problems/injury, not taking any medicine, pt. reports has been crying a lot  . Arthritis    low back  . Asthma   . Depression   . GERD (gastroesophageal reflux disease)   . Migraines   . Neck pain     Patient Active Problem List   Diagnosis Date Noted  . PTSD (post-traumatic stress disorder) 05/29/2013  . Depression with suicidal ideation 05/29/2013  . Suicide attempt by multiple drug overdose (HCC) 05/29/2013  . Chronic pain following surgery or procedure 05/29/2013  . HNP (herniated nucleus pulposus), lumbar 02/04/2013    Past Surgical History:  Procedure Laterality Date  . childbirth     x4 vaginal   . LUMBAR LAMINECTOMY/DECOMPRESSION MICRODISCECTOMY  06/05/2012   Procedure: LUMBAR LAMINECTOMY/DECOMPRESSION MICRODISCECTOMY;  Surgeon: Emilee Hero, MD;  Location: [facility] OR;  Service: Orthopedics;  Laterality: Left;  Left sided lumbar 4-5 microdisectomy    OB History    No data available       Home Medications    Prior to Admission medications   Medication Sig Start Date End Date Taking? Authorizing Provider  albuterol (PROVENTIL HFA;VENTOLIN HFA) 108 (90 BASE)  MCG/ACT inhaler Inhale 1-2 puffs into the lungs every 6 (six) hours as needed for wheezing or shortness of breath. Patient not taking: Reported on 10/14/2014 05/24/14   Trixie Dredge, PA-C  azithromycin (ZITHROMAX) 250 MG tablet Take 1 tablet (250 mg total) by mouth daily. Take first 2 tablets together, then 1 every day until finished. Patient not taking: Reported on 03/30/2015 01/24/15   Antony Madura, PA-C  beclomethasone (QVAR) 40 MCG/ACT inhaler Inhale 2 puffs into the lungs 2 (two) times daily. Patient not taking: Reported on 10/14/2014 05/24/14   Trixie Dredge, PA-C  benzonatate (TESSALON) 100 MG capsule Take 1 capsule (100 mg total) by mouth every 8 (eight) hours. 09/29/16   Linwood Dibbles, MD  doxycycline (VIBRAMYCIN) 100 MG capsule Take 1 capsule (100 mg total) by mouth 2 (two) times daily. 02/26/17   Dorena Bodo, NP  DULoxetine (CYMBALTA) 30 MG capsule Take 1 capsule (30 mg total) by mouth daily. For depression and anxiety and pain. Patient not taking: Reported on 10/14/2014 06/03/13   Verne Spurr T, PA-C  gabapentin (NEURONTIN) 600 MG tablet Take 1 tablet (600 mg total) by mouth 3 (three) times daily. For anxiety, neuropathic pain and depression. Patient not taking: Reported on 10/14/2014 06/03/13   Verne Spurr T, PA-C  lansoprazole (PREVACID) 30 MG capsule Take 1 capsule (30 mg total) by mouth daily at 12 noon. Patient not taking: Reported on 01/24/2015 10/14/14   Loren Racer, MD  naproxen (NAPROSYN) 500 MG tablet Take 1  tablet (500 mg total) by mouth 2 (two) times daily. 09/29/16   Linwood Dibbles, MD  ondansetron (ZOFRAN ODT) 4 MG disintegrating tablet  ODT q4 hours prn nausea/vomit Patient not taking: Reported on 01/24/2015 10/14/14   Loren Racer, MD  predniSONE (DELTASONE) 20 MG tablet Take 2 tablets (40 mg total) by mouth daily. Patient not taking: Reported on 03/30/2015 01/24/15   Antony Madura, PA-C  pseudoephedrine (SUDAFED 12 HOUR) 120 MG 12 hr tablet Take 1 tablet (120 mg total) by mouth  every 12 (twelve) hours. 09/29/16   Linwood Dibbles, MD    Family History No family history on file.  Social History Social History  Substance Use Topics  . Smoking status: Never Smoker  . Smokeless tobacco: Never Used  . Alcohol use No     Allergies   Hydrocodone-acetaminophen   Review of Systems Review of Systems  Constitutional: Negative for diaphoresis and fever.  HENT: Negative.  Negative for facial swelling and trouble swallowing.   Respiratory: Negative.  Negative for shortness of breath.   Gastrointestinal: Negative.  Negative for nausea.  Musculoskeletal: Negative.   Skin: Positive for rash.  Neurological: Negative.      Physical Exam Updated Vital Signs BP 129/90 (BP Location: Right Arm)   Pulse 79   Temp 97.9 F (36.6 C) (Oral)   Resp 17   SpO2 99%   Physical Exam  Constitutional: She is oriented to person, place, and time. She appears well-developed and well-nourished.  HENT:  No facial swelling.  Neck: Normal range of motion.  Pulmonary/Chest: Effort normal.  Neurological: [name] and oriented to person, place, and time.  Skin: Skin is warm and dry. Rash noted.  Right UE: there is a raised, blistering rash in a linear pattern, with several other areas of singular blistering. Facial rash is maculopapular without vesicles or blisters.      ED Treatments / Results  Labs (all labs ordered are listed, but only abnormal results are displayed) Labs Reviewed - No data to display  EKG  EKG Interpretation None       Radiology No results found.  Procedures Procedures (including critical care time)  Medications Ordered in ED Medications - No data to display   Initial Impression / Assessment and Plan / ED Course  I have reviewed the triage vital signs and the nursing notes.  Pertinent labs & imaging results that were available during my care of the patient were reviewed by me and considered in my medical decision making (see chart for  details).     Patient presents with rash that is c/w contact dermatitis. Will provide Rx for prednisone and Benadryl. Return precautions discussed.   Final Clinical Impressions(s) / ED Diagnoses   Final diagnoses:  None   1. Contact dermatitis  New Prescriptions New Prescriptions   No medications on file     Danne Harbor 05/26/17 0404    Dione Booze, MD 05/26/17 [phone]

## 2017-05-26 NOTE — ED Triage Notes (Signed)
Pt c/o "itching all over", pt has bumps to her right arm and face. Denies shortness of breath, noticed the itching and bumps yesterday. 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

## 2017-11-10 ENCOUNTER — Emergency Department (HOSPITAL_COMMUNITY)
Admission: EM | Admit: 2017-11-10 | Discharge: 2017-11-11 | Disposition: A | Discharge status: Home / Self Care | Payer: Self-pay | Attending: Emergency Medicine | Admitting: Emergency Medicine

## 2017-11-10 ENCOUNTER — Emergency Department (HOSPITAL_COMMUNITY): Payer: Self-pay

## 2017-11-10 ENCOUNTER — Encounter (HOSPITAL_COMMUNITY): Payer: Self-pay | Admitting: Emergency Medicine

## 2017-11-10 ENCOUNTER — Other Ambulatory Visit: Payer: Self-pay

## 2017-11-10 DIAGNOSIS — Z79899 Other long term (current) drug therapy: Secondary | ICD-10-CM | POA: Insufficient documentation

## 2017-11-10 DIAGNOSIS — O200 Threatened abortion: Secondary | ICD-10-CM | POA: Insufficient documentation

## 2017-11-10 DIAGNOSIS — O4690 Antepartum hemorrhage, unspecified, unspecified trimester: Secondary | ICD-10-CM

## 2017-11-10 DIAGNOSIS — O9952 Diseases of the respiratory system complicating childbirth: Secondary | ICD-10-CM | POA: Insufficient documentation

## 2017-11-10 DIAGNOSIS — Z3A01 Less than 8 weeks gestation of pregnancy: Secondary | ICD-10-CM | POA: Insufficient documentation

## 2017-11-10 LAB — HCG, QUANTITATIVE, PREGNANCY: hCG, Beta Chain, Quant, S: 16718 m[IU]/mL — ABNORMAL HIGH (ref <5)

## 2017-11-10 LAB — BASIC METABOLIC PANEL
Anion gap: 12 (ref 5–15)
BUN: 8 mg/dL (ref 6–20)
CHLORIDE: 106 mmol/L (ref 101–111)
CO2: 20 mmol/L — ABNORMAL LOW (ref 22–32)
Calcium: 8.8 mg/dL — ABNORMAL LOW (ref 8.9–10.3)
Creatinine, Ser: 0.58 mg/dL (ref 0.44–1.00)
GFR calc Af Amer: >60 mL/min (ref >60)
GFR calc non Af Amer: >60 mL/min (ref >60)
Glucose, Bld: 112 mg/dL — ABNORMAL HIGH (ref 65–99)
POTASSIUM: 4 mmol/L (ref 3.5–5.1)
SODIUM: 138 mmol/L (ref 135–145)

## 2017-11-10 LAB — CBC WITH DIFFERENTIAL/PLATELET
Basophils Absolute: 0 10*3/uL (ref 0.0–0.1)
Basophils Relative: 0 %
EOS ABS: 0.3 10*3/uL (ref 0.0–0.7)
Eosinophils Relative: 3 %
HCT: 34.4 % — ABNORMAL LOW (ref 36.0–46.0)
Hemoglobin: 11.4 g/dL — ABNORMAL LOW (ref 12.0–15.0)
LYMPHS PCT: 38 %
Lymphs Abs: 2.9 10*3/uL (ref 0.7–4.0)
MCH: 29.7 pg (ref 26.0–34.0)
MCHC: 33.1 g/dL (ref 30.0–36.0)
MCV: 89.6 fL (ref 78.0–100.0)
MONOS PCT: 7 %
Monocytes Absolute: 0.6 10*3/uL (ref 0.1–1.0)
NEUTROS ABS: 4 10*3/uL (ref 1.7–7.7)
Neutrophils Relative %: 52 %
Platelets: 330 10*3/uL (ref 150–400)
RBC: 3.84 MIL/uL — AB (ref 3.87–5.11)
RDW: 13 % (ref 11.5–15.5)
WBC: 7.8 10*3/uL (ref 4.0–10.5)

## 2017-11-10 LAB — I-STAT BETA HCG BLOOD, ED (MC, WL, AP ONLY)

## 2017-11-10 MED ORDER — SODIUM CHLORIDE 0.9 % IV SOLN
INTRAVENOUS | Status: DC
  Administered 2017-11-10: 23:00:00 via INTRAVENOUS

## 2017-11-10 NOTE — ED Notes (Signed)
Patient transported to Ultrasound 

## 2017-11-10 NOTE — ED Provider Notes (Signed)
[facility] Provider Note   CSN: [id number] Arrival date & time: 11/10/17  2113     History   Chief Complaint Chief Complaint  Patient presents with  . Vaginal Bleeding    HPI Shea StakesOsbelia Casas is a 37 y.o. G7 P4 with LMP sometime in January and positive pregnancy test presents to the ED with vaginal bleeding and low abdominal cramping that started today. Patient not using birth control.   HPI  Past Medical History:  Diagnosis Date  . Anxiety    relative to circumstances to back problems/injury, not taking any medicine, pt. reports has been crying a lot  . Arthritis    low back  . Asthma   . Depression   . GERD (gastroesophageal reflux disease)   . Migraines   . Neck pain     Patient Active Problem List   Diagnosis Date Noted  . PTSD (post-traumatic stress disorder) 05/29/2013  . Depression with suicidal ideation 05/29/2013  . Suicide attempt by multiple drug overdose (HCC) 05/29/2013  . Chronic pain following surgery or procedure 05/29/2013  . HNP (herniated nucleus pulposus), lumbar 02/04/2013    Past Surgical History:  Procedure Laterality Date  . childbirth     x4 vaginal   . LUMBAR LAMINECTOMY/DECOMPRESSION MICRODISCECTOMY  06/05/2012   Procedure: LUMBAR LAMINECTOMY/DECOMPRESSION MICRODISCECTOMY;  Surgeon: Emilee HeroMark Leonard Dumonski, MD;  [facility];  Service: Orthopedics;  Laterality: Left;  Left sided lumbar 4-5 microdisectomy    OB History    No data available       Home Medications    Prior to Admission medications   Medication Sig Start Date End Date Taking? Authorizing Provider  albuterol (PROVENTIL HFA;VENTOLIN HFA) 108 (90 BASE) MCG/ACT inhaler Inhale 1-2 puffs into the lungs every 6 (six) hours as needed for wheezing or shortness of breath. Patient not taking: Reported on 10/14/2014 05/24/14   Trixie DredgeWest, Emily, PA-C  azithromycin (ZITHROMAX) 250 MG tablet Take 1 tablet (250 mg total) by mouth daily. Take first 2  tablets together, then 1 every day until finished. Patient not taking: Reported on 03/30/2015 01/24/15   Antony MaduraHumes, Kelly, PA-C  beclomethasone (QVAR) 40 MCG/ACT inhaler Inhale 2 puffs into the lungs 2 (two) times daily. Patient not taking: Reported on 10/14/2014 05/24/14   Trixie DredgeWest, Emily, PA-C  benzonatate (TESSALON) 100 MG capsule Take 1 capsule (100 mg total) by mouth every 8 (eight) hours. 09/29/16   Linwood DibblesKnapp, Jon, MD  diphenhydrAMINE (BENADRYL) 25 mg capsule Take one every 6 hours for 3 days, then as needed for recurrent rash or for itching. 05/26/17   Elpidio AnisUpstill, Shari, PA-C  doxycycline (VIBRAMYCIN) 100 MG capsule Take 1 capsule (100 mg total) by mouth 2 (two) times daily. 02/26/17   Dorena BodoKennard, Lawrence, NP  DULoxetine (CYMBALTA) 30 MG capsule Take 1 capsule (30 mg total) by mouth daily. For depression and anxiety and pain. Patient not taking: Reported on 10/14/2014 06/03/13   Verne SpurrMashburn, Neil T, PA-C  gabapentin (NEURONTIN) 600 MG tablet Take 1 tablet (600 mg total) by mouth 3 (three) times daily. For anxiety, neuropathic pain and depression. Patient not taking: Reported on 10/14/2014 06/03/13   Verne SpurrMashburn, Neil T, PA-C  lansoprazole (PREVACID) 30 MG capsule Take 1 capsule (30 mg total) by mouth daily at 12 noon. Patient not taking: Reported on 01/24/2015 10/14/14   Loren RacerYelverton, David, MD  naproxen (NAPROSYN) 500 MG tablet Take 1 tablet (500 mg total) by mouth 2 (two) times daily. 09/29/16   Linwood DibblesKnapp, Jon, MD  ondansetron [facility](ZOFRAN  ODT) 4 MG disintegrating tablet 4mg  ODT q4 hours prn nausea/vomit Patient not taking: Reported on 01/24/2015 10/14/14   Loren Racer, MD  predniSONE (DELTASONE) 20 MG tablet Take 3 tablets (60 mg total) by mouth daily. 05/26/17   Elpidio Anis, PA-C  pseudoephedrine (SUDAFED 12 HOUR) 120 MG 12 hr tablet Take 1 tablet (120 mg total) by mouth every 12 (twelve) hours. 09/29/16   Linwood Dibbles, MD    Family History No family history on file.  Social History Social History   Tobacco Use  . Smoking status:  Never Smoker  . Smokeless tobacco: Never Used  Substance Use Topics  . Alcohol use: No  . Drug use: No     Allergies   Hydrocodone-acetaminophen   Review of Systems Review of Systems  Constitutional: Negative for chills and fever.  HENT: Negative.   Gastrointestinal: Positive for abdominal pain. Negative for nausea and vomiting.  Genitourinary: Positive for frequency, pelvic pain and vaginal bleeding. Negative for dysuria.  Musculoskeletal: Positive for back pain.  Neurological: Negative for headaches.  Psychiatric/Behavioral: The patient is not nervous/anxious.      Physical Exam Updated Vital Signs BP 123/87 (BP Location: Right Arm)   Pulse 71   Temp 98.1 F (36.7 C) (Oral)   Resp 18   Ht 5\' 4"  (1.626 m)   Wt 84.8 kg (187 lb)   SpO2 100%   BMI 32.10 kg/m   Physical Exam  Constitutional: She appears well-developed and well-nourished. No distress.  HENT:  Head: Normocephalic.  Eyes: EOM are normal.  Neck: Neck supple.  Cardiovascular: Normal rate.  Pulmonary/Chest: Effort normal.  Abdominal: Soft. There is tenderness.  Tender with palpation lower abdomen, no guarding or rebound.  Genitourinary:  Genitourinary Comments: External genitalia without lesions, moderate blood vaginal vault. Cervix finger tip, mild bilateral adnexal tenderness. Uterus without palpable enlargement.   Musculoskeletal: Normal range of motion.  Neurological: [name].  Skin: Skin is warm and dry.  Psychiatric: She has a normal mood and affect.  Nursing note and vitals reviewed.    ED Treatments / Results  Labs (all labs ordered are listed, but only abnormal results are displayed) Labs Reviewed  WET PREP, GENITAL - Abnormal; Notable for the following components:      Result Value   WBC, Wet Prep HPF POC FEW (*)    All other components within normal limits  HCG, QUANTITATIVE, PREGNANCY - Abnormal; Notable for the following components:   hCG, Beta Chain, Quant, S 16,718 (*)     All other components within normal limits  CBC WITH DIFFERENTIAL/PLATELET - Abnormal; Notable for the following components:   RBC 3.84 (*)    Hemoglobin 11.4 (*)    HCT 34.4 (*)    All other components within normal limits  BASIC METABOLIC PANEL - Abnormal; Notable for the following components:   CO2 20 (*)    Glucose, Bld 112 (*)    Calcium 8.8 (*)    All other components within normal limits  I-STAT BETA HCG BLOOD, ED ([facility], [facility], [facility] ONLY) - Abnormal; Notable for the following components:   I-stat hCG, quantitative >2,000.0 (*)    All other components within normal limits  ABO/RH  GC/CHLAMYDIA PROBE AMP () NOT AT [facility]   Radiology US Ob Comp Less 14 Wks  Result Date: 11/10/2017 CLINICAL DATA:  Vaginal bleeding in pregnancy EXAM: OBSTETRIC <14 WK [place] AND TRANSVAGINAL OB US TECHNIQUE: Both transabdominal and transvaginal ultrasound examinations were performed for complete evaluation of  the gestation as well as the maternal uterus, adnexal regions, and pelvic cul-de-sac. Transvaginal technique was performed to assess early pregnancy. COMPARISON:  None. FINDINGS: Intrauterine gestational sac: Single intrauterine gestational sac Yolk sac:  Possible yolk sac Embryo:  Not seen MSD: 19.7 mm   6 w   6 d Subchorionic hemorrhage:  None visualized. Maternal uterus/adnexae: Ovaries are within normal limits. Right ovary measures 3.3 x 2.3 x 2.6 cm. Left ovary measures 2.8 x 1.8 x 2.5 cm. No significant free fluid. IMPRESSION: Single intrauterine gestation with mean sac diameter of 19.7 mm but no embryo. Findings are suspicious but not yet definitive for failed pregnancy. Recommend follow-up US in 10-14 days for definitive diagnosis. This recommendation follows SRU consensus guidelines: Diagnostic Criteria for Nonviable Pregnancy Early in the First Trimester. Malva Limes Med 2013; 161:0960-45. Electronically Signed   By: Jasmine Pang M.D.   On: 11/10/2017 23:58   US Ob Transvaginal  Result Date:  11/10/2017 CLINICAL DATA:  Vaginal bleeding in pregnancy EXAM: OBSTETRIC <14 WK [place] AND TRANSVAGINAL OB US TECHNIQUE: Both transabdominal and transvaginal ultrasound examinations were performed for complete evaluation of the gestation as well as the maternal uterus, adnexal regions, and pelvic cul-de-sac. Transvaginal technique was performed to assess early pregnancy. COMPARISON:  None. FINDINGS: Intrauterine gestational sac: Single intrauterine gestational sac Yolk sac:  Possible yolk sac Embryo:  Not seen MSD: 19.7 mm   6 w   6 d Subchorionic hemorrhage:  None visualized. Maternal uterus/adnexae: Ovaries are within normal limits. Right ovary measures 3.3 x 2.3 x 2.6 cm. Left ovary measures 2.8 x 1.8 x 2.5 cm. No significant free fluid. IMPRESSION: Single intrauterine gestation with mean sac diameter of 19.7 mm but no embryo. Findings are suspicious but not yet definitive for failed pregnancy. Recommend follow-up US in 10-14 days for definitive diagnosis. This recommendation follows SRU consensus guidelines: Diagnostic Criteria for Nonviable Pregnancy Early in the First Trimester. Malva Limes Med 2013; 409:8119-14. Electronically Signed   By: Jasmine Pang M.D.   On: 11/10/2017 23:58    Procedures Procedures (including critical care time)  Medications Ordered in ED Medications  0.9 %  sodium chloride infusion ( Intravenous Stopped 11/11/17 0040)     Initial Impression / Assessment and Plan / ED Course  I have reviewed the triage vital signs and the nursing notes. 37 y.o. female with vaginal bleeding in early pregnancy and ultrasound concerning for failed pregnancy. Patient to f/u at [facility] hospital out patient clinic in 2 days for repeat Bhcg. She will go to [facility] sooner if symptoms worsen. Discussed with the patient plan and she agrees.   Final Clinical Impressions(s) / ED Diagnoses   Final diagnoses:  Threatened miscarriage in early pregnancy    ED Discharge Orders    None       Kerrie [place],  NP 11/11/17 Geronimo Boot    Cathren Laine, MD 11/12/17 1321

## 2017-11-10 NOTE — ED Triage Notes (Signed)
Pt states she found out that she is pregnant last week with a positive pregnancy test, today she started having vaginal bleed. Pt is unable to say how long she is been pregnant states last menstrual period some day during January don't remember the day.

## 2017-11-10 NOTE — ED Notes (Signed)
ED Provider at bedside. 

## 2017-11-10 NOTE — ED Notes (Signed)
Please note: Wet Prep and GC Chlamydia collected by 22:54.

## 2017-11-11 LAB — WET PREP, GENITAL
CLUE CELLS WET PREP: NONE SEEN
SPERM: NONE SEEN
TRICH WET PREP: NONE SEEN
YEAST WET PREP: NONE SEEN

## 2017-11-11 LAB — GC/CHLAMYDIA PROBE AMP (~~LOC~~) NOT AT ARMC
Chlamydia: NEGATIVE
Neisseria Gonorrhea: NEGATIVE

## 2017-11-11 LAB — ABO/RH: ABO/RH(D): O POS

## 2017-11-11 NOTE — Discharge Instructions (Signed)
[facility] Tuesday at 9 am for follow up. If your symptoms worsen before then such as heavy bleeding, fever, severe pain or other problems [facility] for further evaluation.

## 2017-11-12 ENCOUNTER — Telehealth: Payer: Self-pay | Admitting: General Practice

## 2017-11-12 ENCOUNTER — Ambulatory Visit: Payer: Self-pay

## 2017-11-12 NOTE — Telephone Encounter (Signed)
Patient no showed for stat bhcg appt today. Called patient with pacific interpreter [phone], no answer- left message stating we are calling regarding a missed appt in our office today. Please call us back to reschedule. Will send certified letter

## 2017-11-15 ENCOUNTER — Emergency Department (HOSPITAL_COMMUNITY): Payer: Self-pay

## 2017-11-15 ENCOUNTER — Emergency Department (HOSPITAL_COMMUNITY)
Admission: EM | Admit: 2017-11-15 | Discharge: 2017-11-16 | Disposition: A | Discharge status: Home / Self Care | Payer: Self-pay | Attending: Emergency Medicine | Admitting: Emergency Medicine

## 2017-11-15 ENCOUNTER — Encounter (HOSPITAL_COMMUNITY): Payer: Self-pay | Admitting: Emergency Medicine

## 2017-11-15 DIAGNOSIS — J45909 Unspecified asthma, uncomplicated: Secondary | ICD-10-CM | POA: Insufficient documentation

## 2017-11-15 DIAGNOSIS — O99341 Other mental disorders complicating pregnancy, first trimester: Secondary | ICD-10-CM | POA: Insufficient documentation

## 2017-11-15 DIAGNOSIS — O9952 Diseases of the respiratory system complicating childbirth: Secondary | ICD-10-CM | POA: Insufficient documentation

## 2017-11-15 DIAGNOSIS — F329 Major depressive disorder, single episode, unspecified: Secondary | ICD-10-CM | POA: Insufficient documentation

## 2017-11-15 DIAGNOSIS — F419 Anxiety disorder, unspecified: Secondary | ICD-10-CM | POA: Insufficient documentation

## 2017-11-15 DIAGNOSIS — O200 Threatened abortion: Secondary | ICD-10-CM

## 2017-11-15 DIAGNOSIS — O209 Hemorrhage in early pregnancy, unspecified: Secondary | ICD-10-CM | POA: Insufficient documentation

## 2017-11-15 DIAGNOSIS — O4690 Antepartum hemorrhage, unspecified, unspecified trimester: Secondary | ICD-10-CM

## 2017-11-15 DIAGNOSIS — Z3A01 Less than 8 weeks gestation of pregnancy: Secondary | ICD-10-CM | POA: Insufficient documentation

## 2017-11-15 LAB — CBC WITH DIFFERENTIAL/PLATELET
BASOS PCT: 0 %
Basophils Absolute: 0 10*3/uL (ref 0.0–0.1)
EOS ABS: 0.2 10*3/uL (ref 0.0–0.7)
EOS PCT: 2 %
HCT: 34.9 % — ABNORMAL LOW (ref 36.0–46.0)
Hemoglobin: 11.7 g/dL — ABNORMAL LOW (ref 12.0–15.0)
Lymphocytes Relative: 32 %
Lymphs Abs: 2.7 10*3/uL (ref 0.7–4.0)
MCH: 29.8 pg (ref 26.0–34.0)
MCHC: 33.5 g/dL (ref 30.0–36.0)
MCV: 88.8 fL (ref 78.0–100.0)
MONO ABS: 0.7 10*3/uL (ref 0.1–1.0)
MONOS PCT: 8 %
Neutro Abs: 4.9 10*3/uL (ref 1.7–7.7)
Neutrophils Relative %: 58 %
PLATELETS: 332 10*3/uL (ref 150–400)
RBC: 3.93 MIL/uL (ref 3.87–5.11)
RDW: 12.6 % (ref 11.5–15.5)
WBC: 8.5 10*3/uL (ref 4.0–10.5)

## 2017-11-15 LAB — HCG, QUANTITATIVE, PREGNANCY: hCG, Beta Chain, Quant, S: 10799 m[IU]/mL — ABNORMAL HIGH (ref <5)

## 2017-11-15 NOTE — ED Triage Notes (Signed)
Reports being [redacted] weeks pregnant.  Was seen a week ago for vaginal bleeding.  Reports that is has continued and blood is dark in color.  Also c/o abdominal cramping.  Has not been able to get an appointment for follow up.

## 2017-11-15 NOTE — ED Notes (Signed)
Patient transported to Ultrasound 

## 2017-11-15 NOTE — ED Provider Notes (Signed)
[facility] Provider Note   CSN: [id number] Arrival date & time: 11/15/17  1826     History   Chief Complaint Chief Complaint  Patient presents with  . Vaginal Bleeding    HPI [name] is a 37 y.o. female.  37 year old female presents with complaint of vaginal bleeding. She is G7P4 and was seen one week ago for same complaint. She reports that her bleeding has increased over the last several days. No abdominal pain. No fevers. No other vaginal discharge.   HCG from 5 days prior was 16,718. Type is O Pos.   Pelvic US is suggestive of failed pregnancy. Gestational age is approximately 6 weeks, 6 days.    The history is provided by the patient.  Vaginal Bleeding  Primary symptoms include vaginal bleeding. There has been no fever. The fever has been present for 5 days or more. This is a new problem. The current episode started more than 2 days ago. The problem occurs constantly. The problem has not changed since onset.She is pregnant. Her LMP was weeks ago. She has tried nothing for the symptoms.    Past Medical History:  Diagnosis Date  . Anxiety    relative to circumstances to back problems/injury, not taking any medicine, pt. reports has been crying a lot  . Arthritis    low back  . Asthma   . Depression   . GERD (gastroesophageal reflux disease)   . Migraines   . Neck pain     Patient Active Problem List   Diagnosis Date Noted  . PTSD (post-traumatic stress disorder) 05/29/2013  . Depression with suicidal ideation 05/29/2013  . Suicide attempt by multiple drug overdose (HCC) 05/29/2013  . Chronic pain following surgery or procedure 05/29/2013  . HNP (herniated nucleus pulposus), lumbar 02/04/2013    Past Surgical History:  Procedure Laterality Date  . childbirth     x4 vaginal   . LUMBAR LAMINECTOMY/DECOMPRESSION MICRODISCECTOMY  06/05/2012   Procedure: LUMBAR LAMINECTOMY/DECOMPRESSION MICRODISCECTOMY;  Surgeon: Emilee HeroMark  Leonard Dumonski, MD;  [facility];  Service: Orthopedics;  Laterality: Left;  Left sided lumbar 4-5 microdisectomy    OB History    Gravida Para Term Preterm AB Living   1             SAB TAB Ectopic Multiple Live Births                   Home Medications    Prior to Admission medications   Medication Sig Start Date End Date Taking? Authorizing Provider  ibuprofen (ADVIL,MOTRIN) 200 MG tablet Take 200 mg by mouth once.   Yes [provider]  albuterol (PROVENTIL HFA;VENTOLIN HFA) 108 (90 BASE) MCG/ACT inhaler Inhale 1-2 puffs into the lungs every 6 (six) hours as needed for wheezing or shortness of breath. Patient not taking: Reported on 10/14/2014 05/24/14   Trixie DredgeWest, Emily, PA-C  beclomethasone (QVAR) 40 MCG/ACT inhaler Inhale 2 puffs into the lungs 2 (two) times daily. Patient not taking: Reported on 10/14/2014 05/24/14   Trixie DredgeWest, Emily, PA-C  benzonatate (TESSALON) 100 MG capsule Take 1 capsule (100 mg total) by mouth every 8 (eight) hours. Patient not taking: Reported on 11/15/2017 09/29/16   Linwood DibblesKnapp, Jon, MD  diphenhydrAMINE (BENADRYL) 25 mg capsule Take one every 6 hours for 3 days, then as needed for recurrent rash or for itching. Patient not taking: Reported on 11/15/2017 05/26/17   Elpidio AnisUpstill, Shari, PA-C  DULoxetine (CYMBALTA) 30 MG capsule Take  1 capsule (30 mg total) by mouth daily. For depression and anxiety and pain. Patient not taking: Reported on 10/14/2014 06/03/13   Verne Spurr T, PA-C  gabapentin (NEURONTIN) 600 MG tablet Take 1 tablet (600 mg total) by mouth 3 (three) times daily. For anxiety, neuropathic pain and depression. Patient not taking: Reported on 10/14/2014 06/03/13   Verne Spurr T, PA-C  lansoprazole (PREVACID) 30 MG capsule Take 1 capsule (30 mg total) by mouth daily at 12 noon. Patient not taking: Reported on 01/24/2015 10/14/14   Loren Racer, MD  naproxen (NAPROSYN) 500 MG tablet Take 1 tablet (500 mg total) by mouth 2 (two) times daily. Patient not taking:  Reported on 11/15/2017 09/29/16   Linwood Dibbles, MD  ondansetron (ZOFRAN ODT) 4 MG disintegrating tablet 4mg  ODT q4 hours prn nausea/vomit Patient not taking: Reported on 01/24/2015 10/14/14   Loren Racer, MD  pseudoephedrine (SUDAFED 12 HOUR) 120 MG 12 hr tablet Take 1 tablet (120 mg total) by mouth every 12 (twelve) hours. Patient not taking: Reported on 11/15/2017 09/29/16   Linwood Dibbles, MD    Family History No family history on file.  Social History Social History   Tobacco Use  . Smoking status: Never Smoker  . Smokeless tobacco: Never Used  Substance Use Topics  . Alcohol use: No  . Drug use: No     Allergies   Hydrocodone-acetaminophen   Review of Systems Review of Systems  Genitourinary: Positive for vaginal bleeding.  All other systems reviewed and are negative.    Physical Exam Updated Vital Signs BP 100/68   Pulse 81   Temp 99.2 F (37.3 C) (Oral)   Resp 16   Ht 5\' 4"  (1.626 m)   Wt 81.6 kg (180 lb)   SpO2 100%   BMI 30.90 kg/m   Physical Exam  Constitutional: She is oriented to person, place, and time. She appears well-developed and well-nourished. No distress.  HENT:  Head: Normocephalic and atraumatic.  Mouth/Throat: Oropharynx is clear and moist.  Eyes: Conjunctivae and EOM are normal. Pupils are equal, round, and reactive to light.  Neck: Normal range of motion. Neck supple.  Cardiovascular: Normal rate, regular rhythm and normal heart sounds.  Pulmonary/Chest: Effort normal and breath sounds normal. No respiratory distress.  Abdominal: Soft. She exhibits no distension. There is no tenderness.  Genitourinary: Uterus normal. Cervix exhibits no motion tenderness and no discharge. There is bleeding in the vagina. No erythema in the vagina. No foreign body in the vagina. No signs of injury around the vagina. No vaginal discharge found.  Musculoskeletal: Normal range of motion. She exhibits no edema or deformity.  Neurological: [name] and oriented to  person, place, and time.  Skin: Skin is warm and dry.  Psychiatric: She has a normal mood and affect.  Nursing note and vitals reviewed.    ED Treatments / Results  Labs (all labs ordered are listed, but only abnormal results are displayed) Labs Reviewed  HCG, QUANTITATIVE, PREGNANCY - Abnormal; Notable for the following components:      Result Value   hCG, Beta Chain, Quant, S 10,799 (*)    All other components within normal limits  CBC WITH DIFFERENTIAL/PLATELET - Abnormal; Notable for the following components:   Hemoglobin 11.7 (*)    HCT 34.9 (*)    All other components within normal limits    EKG  EKG Interpretation None       Radiology No results found.  Procedures Procedures (including critical care time)  Medications Ordered in ED Medications - No data to display   Initial Impression / Assessment and Plan / ED Course  I have reviewed the triage vital signs and the nursing notes.  Pertinent labs & imaging results that were available during my care of the patient were reviewed by me and considered in my medical decision making (see chart for details).     MDM  Screen Complete  Presentation is consistent with evolving miscarriage. HCG has dropped from 16,000 to 10,000. [place] suggests non-viable pregnancy. No other significant abnormalities found during ED workup. Patient understands the need for close FU. Strict return precautions given and understood.   Final Clinical Impressions(s) / ED Diagnoses   Final diagnoses:  Vaginal bleeding in pregnancy    ED Discharge Orders    None       Wynetta Fines, MD 11/16/17 0025

## 2017-11-16 NOTE — Discharge Instructions (Signed)
Please followup with OBGYN as instructed. Return for any problem.

## 2018-09-10 NOTE — L&D Delivery Note (Signed)
Delivery Note Pt finally became C/C/0 station w/strong urge to push. After about a 30 minute 2nd stage, at 1:34 PM a viable female was delivered via Vaginal, Spontaneous (Presentation: LOA/acynclitic  ).  APGAR: 9, 9; weight pending. After 1 minute, the cord was clamped and cut. 40 units of pitocin diluted in 1000cc LR was infused rapidly IV.  The placenta separated spontaneously and delivered via CCT and maternal pushing effort.  It was inspected and appears to be intact with a 3 VC.   Marland Kitchen    Anesthesia:  epidural Episiotomy: None Lacerations: None Suture Repair:  Est. Blood Loss (mL): 100  Mom to postpartum.  Baby to Couplet care / Skin to Skin.  [name] 12/27/2018, 1:52 PM

## 2018-09-30 ENCOUNTER — Other Ambulatory Visit (HOSPITAL_COMMUNITY): Payer: Self-pay | Admitting: Nurse Practitioner

## 2018-09-30 DIAGNOSIS — Z3689 Encounter for other specified antenatal screening: Secondary | ICD-10-CM

## 2018-09-30 LAB — OB RESULTS CONSOLE HEPATITIS B SURFACE ANTIGEN: Hepatitis B Surface Ag: NEGATIVE

## 2018-09-30 LAB — OB RESULTS CONSOLE RPR: RPR: NONREACTIVE

## 2018-09-30 LAB — OB RESULTS CONSOLE PLATELET COUNT: Platelets: 329

## 2018-09-30 LAB — OB RESULTS CONSOLE ABO/RH: RH Type: POSITIVE

## 2018-09-30 LAB — OB RESULTS CONSOLE GC/CHLAMYDIA
CHLAMYDIA, DNA PROBE: NEGATIVE
Gonorrhea: NEGATIVE

## 2018-09-30 LAB — CYSTIC FIBROSIS DIAGNOSTIC STUDY: Interpretation-CFDNA:: NEGATIVE

## 2018-09-30 LAB — OB RESULTS CONSOLE HGB/HCT, BLOOD
HCT: 32 (ref 29–41)
Hemoglobin: 10.4

## 2018-09-30 LAB — GLUCOSE, 1 HOUR: Glucose 1 Hour: 132

## 2018-09-30 LAB — OB RESULTS CONSOLE VARICELLA ZOSTER ANTIBODY, IGG: Varicella: NON-IMMUNE/NOT IMMUNE

## 2018-09-30 LAB — OB RESULTS CONSOLE RUBELLA ANTIBODY, IGM: RUBELLA: IMMUNE

## 2018-09-30 LAB — SICKLE CELL SCREEN: Sickle Cell Screen: NORMAL

## 2018-09-30 LAB — OB RESULTS CONSOLE HIV ANTIBODY (ROUTINE TESTING): HIV: NONREACTIVE

## 2018-09-30 LAB — CULTURE, OB URINE

## 2018-09-30 LAB — CYTOLOGY - PAP: PAP SMEAR: ABNORMAL — AB

## 2018-09-30 LAB — OB RESULTS CONSOLE ANTIBODY SCREEN: Antibody Screen: NEGATIVE

## 2018-10-01 ENCOUNTER — Encounter (HOSPITAL_COMMUNITY): Payer: Self-pay | Admitting: *Deleted

## 2018-10-02 ENCOUNTER — Inpatient Hospital Stay (HOSPITAL_COMMUNITY)
Admission: AD | Admit: 2018-10-02 | Discharge: 2018-10-03 | Disposition: A | Discharge status: Home / Self Care | Payer: Self-pay | Attending: Obstetrics & Gynecology | Admitting: Obstetrics & Gynecology

## 2018-10-02 ENCOUNTER — Encounter (HOSPITAL_COMMUNITY): Payer: Self-pay | Admitting: *Deleted

## 2018-10-02 DIAGNOSIS — J101 Influenza due to other identified influenza virus with other respiratory manifestations: Secondary | ICD-10-CM | POA: Insufficient documentation

## 2018-10-02 DIAGNOSIS — R509 Fever, unspecified: Secondary | ICD-10-CM | POA: Insufficient documentation

## 2018-10-02 DIAGNOSIS — Z3A30 30 weeks gestation of pregnancy: Secondary | ICD-10-CM | POA: Insufficient documentation

## 2018-10-02 DIAGNOSIS — O99513 Diseases of the respiratory system complicating pregnancy, third trimester: Secondary | ICD-10-CM | POA: Insufficient documentation

## 2018-10-02 LAB — URINALYSIS, ROUTINE W REFLEX MICROSCOPIC
Bilirubin Urine: NEGATIVE
GLUCOSE, UA: NEGATIVE mg/dL
Hgb urine dipstick: NEGATIVE
Ketones, ur: NEGATIVE mg/dL
Leukocytes, UA: NEGATIVE
Nitrite: NEGATIVE
Protein, ur: NEGATIVE mg/dL
SPECIFIC GRAVITY, URINE: 1.006 (ref 1.005–1.030)
pH: 7 (ref 5.0–8.0)

## 2018-10-02 NOTE — MAU Note (Addendum)
Pt here with complaints of fever, body aches, runny nose and eyes and HA that started today. States her fever was 102. Reports that she had the flu shot on Tuesday. Has some abdominal pain that feels like someone is punching her in the stomach.Took 500mg  of Tylenol around 3-4pm. Reports her kids have been sick with the flu.

## 2018-10-03 ENCOUNTER — Ambulatory Visit (HOSPITAL_COMMUNITY): Payer: Self-pay

## 2018-10-03 ENCOUNTER — Ambulatory Visit (HOSPITAL_COMMUNITY)
Admission: RE | Admit: 2018-10-03 | Discharge: 2018-10-03 | Disposition: A | Discharge status: Home / Self Care | Payer: Self-pay | Source: Ambulatory Visit | Attending: Nurse Practitioner | Admitting: Nurse Practitioner

## 2018-10-03 DIAGNOSIS — O26893 Other specified pregnancy related conditions, third trimester: Secondary | ICD-10-CM

## 2018-10-03 DIAGNOSIS — J101 Influenza due to other identified influenza virus with other respiratory manifestations: Secondary | ICD-10-CM

## 2018-10-03 DIAGNOSIS — Z3689 Encounter for other specified antenatal screening: Secondary | ICD-10-CM | POA: Insufficient documentation

## 2018-10-03 DIAGNOSIS — Z3A30 30 weeks gestation of pregnancy: Secondary | ICD-10-CM

## 2018-10-03 LAB — CBC WITH DIFFERENTIAL/PLATELET
BASOS ABS: 0 10*3/uL (ref 0.0–0.1)
Basophils Relative: 0 %
EOS ABS: 0.1 10*3/uL (ref 0.0–0.5)
Eosinophils Relative: 2 %
HCT: 30.5 % — ABNORMAL LOW (ref 36.0–46.0)
HEMOGLOBIN: 10.2 g/dL — AB (ref 12.0–15.0)
LYMPHS ABS: 1.1 10*3/uL (ref 0.7–4.0)
Lymphocytes Relative: 18 %
MCH: 29.9 pg (ref 26.0–34.0)
MCHC: 33.4 g/dL (ref 30.0–36.0)
MCV: 89.4 fL (ref 80.0–100.0)
Monocytes Absolute: 0.5 10*3/uL (ref 0.1–1.0)
Monocytes Relative: 8 %
NEUTROS PCT: 72 %
NRBC: 0 % (ref 0.0–0.2)
Neutro Abs: 4.2 10*3/uL (ref 1.7–7.7)
Platelets: 288 10*3/uL (ref 150–400)
RBC: 3.41 MIL/uL — ABNORMAL LOW (ref 3.87–5.11)
RDW: 13.2 % (ref 11.5–15.5)
WBC: 5.9 10*3/uL (ref 4.0–10.5)

## 2018-10-03 LAB — COMPREHENSIVE METABOLIC PANEL
ALK PHOS: 90 U/L (ref 38–126)
ALT: 23 U/L (ref 0–44)
AST: 26 U/L (ref 15–41)
Albumin: 3 g/dL — ABNORMAL LOW (ref 3.5–5.0)
Anion gap: 7 (ref 5–15)
BUN: 5 mg/dL — AB (ref 6–20)
CALCIUM: 8 mg/dL — AB (ref 8.9–10.3)
CO2: 18 mmol/L — AB (ref 22–32)
CREATININE: 0.5 mg/dL (ref 0.44–1.00)
Chloride: 108 mmol/L (ref 98–111)
GFR calc non Af Amer: >60 mL/min (ref >60)
Glucose, Bld: 114 mg/dL — ABNORMAL HIGH (ref 70–99)
Potassium: 3.5 mmol/L (ref 3.5–5.1)
SODIUM: 133 mmol/L — AB (ref 135–145)
Total Bilirubin: 0.3 mg/dL (ref 0.3–1.2)
Total Protein: 6.8 g/dL (ref 6.5–8.1)

## 2018-10-03 LAB — INFLUENZA PANEL BY PCR (TYPE A & B)
Influenza A By PCR: NEGATIVE
Influenza B By PCR: POSITIVE — AB

## 2018-10-03 LAB — LIPASE, BLOOD: Lipase: 23 U/L (ref 11–51)

## 2018-10-03 LAB — AMYLASE: AMYLASE: 49 U/L (ref 28–100)

## 2018-10-03 MED ORDER — OSELTAMIVIR PHOSPHATE 75 MG PO CAPS: 75.0000 mg | ORAL_CAPSULE | Freq: Two times a day (BID) | ORAL | 0 refills | Status: AC

## 2018-10-03 MED ORDER — ACETAMINOPHEN 325 MG PO TABS
650.0000 mg | ORAL_TABLET | Freq: Once | ORAL | Status: AC
Start: 2018-10-03 — End: 2018-10-03
  Administered 2018-10-03: 650 mg via ORAL
  Filled 2018-10-03: qty 2

## 2018-10-03 MED ORDER — ONDANSETRON 4 MG PO TBDP
4.0000 mg | ORAL_TABLET | Freq: Once | ORAL | Status: AC
  Administered 2018-10-03: 4 mg via ORAL
  Filled 2018-10-03: qty 1

## 2018-10-03 NOTE — MAU Provider Note (Signed)
Chief Complaint:  Fever   First Provider Initiated Contact with Patient 10/02/18 2353     HPI: [name] is a 38 y.o. [id number] at [id number] presents to maternity admissions reporting flu-like symptoms  Mainly c/o aches and fever. Abdominal pain is just generalized achy pain all over abdomen.  Took tylenol in the afternoon.  Her children have been sick also but has not taken them to doctor yet. . She reports good fetal movement, denies LOF, vaginal bleeding, vaginal itching/burning, urinary symptoms, dizziness, n/v, diarrhea, constipation..  Fever   This is a new problem. The current episode started today. The problem occurs constantly. The problem has been unchanged. The maximum temperature noted was 102 to 102.9 F. The temperature was taken using an oral thermometer. Associated symptoms include abdominal pain, coughing, headaches and muscle aches. Pertinent negatives include no chest pain, congestion, diarrhea, nausea, sore throat, urinary pain, vomiting or wheezing. She has tried acetaminophen for the symptoms. The treatment provided mild relief.  Risk factors: sick contacts     RN note: Pt here with complaints of fever, body aches, runny nose and eyes and HA that started today. States her fever was 102. Reports that she had the flu shot on Tuesday. Has some abdominal pain that feels like someone is punching her in the stomach.Took 500mg  of Tylenol around 3-4pm. Reports her kids have been sick with the flu.  Past Medical History: Past Medical History:  Diagnosis Date  . Anxiety    relative to circumstances to back problems/injury, not taking any medicine, pt. reports has been crying a lot  . Arthritis    low back  . Asthma   . Depression   . GERD (gastroesophageal reflux disease)   . Migraines   . Neck pain     Past obstetric history: OB History  Gravida Para Term Preterm AB Living  8 4 4   3 4   SAB TAB Ectopic Multiple Live Births  1 2          # Outcome Date GA Lbr Len/2nd  Weight Sex Delivery Anes PTL Lv  8 Current           7 TAB           6 TAB           5 SAB           4 Term      Vag-Spont     3 Term      Vag-Spont     2 Term      Vag-Spont     1 Term      Vag-Spont       Past Surgical History: Past Surgical History:  Procedure Laterality Date  . childbirth     x4 vaginal   . LUMBAR LAMINECTOMY/DECOMPRESSION MICRODISCECTOMY  06/05/2012   Procedure: LUMBAR LAMINECTOMY/DECOMPRESSION MICRODISCECTOMY;  Surgeon: Emilee HeroMark Leonard Dumonski, MD;  [facility];  Service: Orthopedics;  Laterality: Left;  Left sided lumbar 4-5 microdisectomy    Family History: History reviewed. No pertinent family history.  Social History: Social History   Tobacco Use  . Smoking status: Never Smoker  . Smokeless tobacco: Never Used  Substance Use Topics  . Alcohol use: No  . Drug use: No    Allergies:  Allergies  Allergen Reactions  . Hydrocodone-Acetaminophen Itching    Has some itching on face & nose but not bad enough to stop taking it/ MD aware and instructed patient to take anti-allergy medication prn.  Meds:  Medications Prior to Admission  Medication Sig Dispense Refill Last Dose  . albuterol (PROVENTIL HFA;VENTOLIN HFA) 108 (90 BASE) MCG/ACT inhaler Inhale 1-2 puffs into the lungs every 6 (six) hours as needed for wheezing or shortness of breath. 1 Inhaler 0 Past Month at Unknown time  . loratadine (CLARITIN) 10 MG tablet Take 10 mg by mouth daily.   10/02/2018 at Unknown time  . Prenatal Vit-Fe Fumarate-FA (MULTIVITAMIN-PRENATAL) 27-0.8 MG TABS tablet Take 1 tablet by mouth daily at 12 noon.   10/01/2018 at Unknown time  . beclomethasone (QVAR) 40 MCG/ACT inhaler Inhale 2 puffs into the lungs 2 (two) times daily. (Patient not taking: Reported on 10/14/2014) 1 Inhaler 0 Not Taking at Unknown time  . benzonatate (TESSALON) 100 MG capsule Take 1 capsule (100 mg total) by mouth every 8 (eight) hours. (Patient not taking: Reported on 11/15/2017) 21 capsule 0 Not  Taking at Unknown time  . diphenhydrAMINE (BENADRYL) 25 mg capsule Take one every 6 hours for 3 days, then as needed for recurrent rash or for itching. (Patient not taking: Reported on 11/15/2017) 30 capsule 0 Not Taking at Unknown time  . DULoxetine (CYMBALTA) 30 MG capsule Take 1 capsule (30 mg total) by mouth daily. For depression and anxiety and pain. (Patient not taking: Reported on 10/14/2014) 30 capsule 0 Not Taking at Unknown time  . gabapentin (NEURONTIN) 600 MG tablet Take 1 tablet (600 mg total) by mouth 3 (three) times daily. For anxiety, neuropathic pain and depression. (Patient not taking: Reported on 10/14/2014) 90 tablet 0 Not Taking at Unknown time  . ibuprofen (ADVIL,MOTRIN) 200 MG tablet Take 200 mg by mouth once.   11/12/2017  . lansoprazole (PREVACID) 30 MG capsule Take 1 capsule (30 mg total) by mouth daily at 12 noon. (Patient not taking: Reported on 01/24/2015) 30 capsule 0 Not Taking at Unknown time  . naproxen (NAPROSYN) 500 MG tablet Take 1 tablet (500 mg total) by mouth 2 (two) times daily. (Patient not taking: Reported on 11/15/2017) 30 tablet 0 Not Taking at Unknown time  . ondansetron (ZOFRAN ODT) 4 MG disintegrating tablet 4mg  ODT q4 hours prn nausea/vomit (Patient not taking: Reported on 01/24/2015) 10 tablet 0 Not Taking at Unknown time  . pseudoephedrine (SUDAFED 12 HOUR) 120 MG 12 hr tablet Take 1 tablet (120 mg total) by mouth every 12 (twelve) hours. (Patient not taking: Reported on 11/15/2017) 14 tablet 0 Not Taking at Unknown time    I have reviewed patient's Past Medical Hx, Surgical Hx, Family Hx, Social Hx, medications and allergies.   ROS:  Review of Systems  Constitutional: Positive for fever.  HENT: Negative for congestion and sore throat.   Respiratory: Positive for cough. Negative for wheezing.   Cardiovascular: Negative for chest pain.  Gastrointestinal: Positive for abdominal pain. Negative for diarrhea, nausea and vomiting.  Genitourinary: Negative for  dysuria.  Neurological: Positive for headaches.   Other systems negative  Physical Exam   Patient Vitals for the past 24 hrs:  BP Temp Pulse Resp SpO2 Height Weight  10/02/18 2325 127/86 99.3 F (37.4 C) (!) 115 18 100 % 5\' 1"  (1.549 m) 98.4 kg   Constitutional: Well-developed, well-nourished female in no acute distress.  Cardiovascular: normal rate and rhythm Respiratory: normal effort, clear to auscultation bilaterally No wheezes or crackles  O2 saturations 99-100% GI: Abd soft, non-tender, gravid appropriate for gestational age.   No rebound or guarding. MS: Extremities nontender, no edema, normal ROM Neurologic: Alert and oriented x  4.  GU: Neg CVAT.  PELVIC EXAM: deferred   FHT:  Baseline 150 , moderate variability, accelerations present, no decelerations Contractions: Uterine irritability    Labs: --/--/O POS (03/03 2212) Results for orders placed or performed during the hospital encounter of 10/02/18 (from the past 24 hour(s))  Urinalysis, Routine w reflex microscopic     Status: Abnormal   Collection Time: 10/02/18 11:12 PM  Result Value Ref Range   Color, Urine YELLOW YELLOW   APPearance HAZY (A) CLEAR   Specific Gravity, Urine 1.006 1.005 - 1.030   pH 7.0 5.0 - 8.0   Glucose, UA NEGATIVE NEGATIVE mg/dL   Hgb urine dipstick NEGATIVE NEGATIVE   Bilirubin Urine NEGATIVE NEGATIVE   Ketones, ur NEGATIVE NEGATIVE mg/dL   Protein, ur NEGATIVE NEGATIVE mg/dL   Nitrite NEGATIVE NEGATIVE   Leukocytes, UA NEGATIVE NEGATIVE  Influenza panel by PCR (type A & B)     Status: Abnormal   Collection Time: 10/02/18 11:58 PM  Result Value Ref Range   Influenza A By PCR NEGATIVE NEGATIVE   Influenza B By PCR POSITIVE (A) NEGATIVE  CBC with Differential/Platelet     Status: Abnormal   Collection Time: 10/03/18 12:20 AM  Result Value Ref Range   WBC 5.9 4.0 - 10.5 K/uL   RBC 3.41 (L) 3.87 - 5.11 MIL/uL   Hemoglobin 10.2 (L) 12.0 - 15.0 g/dL   HCT 29.530.5 (L) 62.136.0 - 30.846.0 %   MCV  89.4 80.0 - 100.0 fL   MCH 29.9 26.0 - 34.0 pg   MCHC 33.4 30.0 - 36.0 g/dL   RDW 65.713.2 84.611.5 - 96.215.5 %   Platelets 288 150 - 400 K/uL   nRBC 0.0 0.0 - 0.2 %   Neutrophils Relative % 72 %   Neutro Abs 4.2 1.7 - 7.7 K/uL   Lymphocytes Relative 18 %   Lymphs Abs 1.1 0.7 - 4.0 K/uL   Monocytes Relative 8 %   Monocytes Absolute 0.5 0.1 - 1.0 K/uL   Eosinophils Relative 2 %   Eosinophils Absolute 0.1 0.0 - 0.5 K/uL   Basophils Relative 0 %   Basophils Absolute 0.0 0.0 - 0.1 K/uL  Comprehensive metabolic panel     Status: Abnormal   Collection Time: 10/03/18 12:20 AM  Result Value Ref Range   Sodium 133 (L) 135 - 145 mmol/L   Potassium 3.5 3.5 - 5.1 mmol/L   Chloride 108 98 - 111 mmol/L   CO2 18 (L) 22 - 32 mmol/L   Glucose, Bld 114 (H) 70 - 99 mg/dL   BUN 5 (L) 6 - 20 mg/dL   Creatinine, Ser 9.520.50 0.44 - 1.00 mg/dL   Calcium 8.0 (L) 8.9 - 10.3 mg/dL   Total Protein 6.8 6.5 - 8.1 g/dL   Albumin 3.0 (L) 3.5 - 5.0 g/dL   AST 26 15 - 41 U/L   ALT 23 0 - 44 U/L   Alkaline Phosphatase 90 38 - 126 U/L   Total Bilirubin 0.3 0.3 - 1.2 mg/dL   GFR calc non Af Amer >60 >60 mL/min   GFR calc Af Amer >60 >60 mL/min   Anion gap 7 5 - 15  Lipase, blood     Status: None   Collection Time: 10/03/18 12:20 AM  Result Value Ref Range   Lipase 23 11 - 51 U/L  Amylase     Status: None   Collection Time: 10/03/18 12:20 AM  Result Value Ref Range   Amylase 49 28 -  100 U/L    Imaging:  No results found.  MAU Course/MDM: I have ordered labs and reviewed results. Influenza B is positive.  Urine showed good hydration status. Other labs normal , no leukocytosis NST reviewed and is reassuring Oxygen saturations excellent, no respiratory distress.   Discussed supportive care for flu symptoms Recommend Tamiflu, sent Rx .    Assessment: Single intrauterine pregnancy at [redacted]w[redacted]d Influenza B  Plan: Discharge home Rx Tamiflu 75mg  bid x 5 days PO hydration and supportive care Tylenol for  fever Preterm Labor precautions and fetal kick counts Follow up in Office for prenatal visits and recheck of status Warning signs reviewed Encouraged to return here or to other Urgent Care/ED if she develops worsening of symptoms, increase in pain, fever, or other concerning symptoms.   Pt stable at time of discharge.  Wynelle Bourgeois CNM, MSN Certified Nurse-Midwife 10/03/2018 12:29 AM

## 2018-10-03 NOTE — Discharge Instructions (Signed)
Gripe en los adultos Influenza, Adult La gripe, tambin llamada "influenza", es una infeccin viral que afecta, principalmente, las vas respiratorias. Las vas respiratorias incluyen rganos que ayudan a respirar, como los pulmones, la nariz y la garganta. La gripe provoca muchos sntomas similares a los del resfro comn, junto con fiebre alta y dolor corporal. Se transmite fcilmente de persona a persona (es contagiosa). La mejor manera de prevenir la gripe es aplicndose la vacuna contra la gripe todos los aos. Cules son las causas? La causa de esta afeccin es el virus de la influenza. Puede contraer el virus de las siguientes maneras:  Al inhalar las gotitas que estn en el aire liberadas por la tos o el estornudo de una persona infectada.  Al tocar algo que estuvo expuesto al virus (fue contaminado) y luego tocarse la boca, nariz u ojos. Qu incrementa el riesgo? Los siguientes factores pueden hacer que usted sea propenso a contraer la gripe:  No lavarse o desinfectarse las manos con frecuencia.  Tener contacto cercano con muchas personas durante la temporada de resfro y gripe.  Tocarse la boca, los ojos o la nariz sin antes lavarse ni desinfectarse las manos.  No colocarse la vacuna anual contra la gripe. Puede correr un mayor riesgo de tener gripe, incluso problemas graves como una infeccin pulmonar (neumona), si:  Es mayor de 65 aos de edad.  Est embarazada.  Tiene debilitado el sistema que combate las enfermedades (sistema inmunitario). Puede tener un sistema inmunitario debilitado si: ? Tienen VIH o sndrome de inmunodeficiencia adquirida (SIDA). ? Est recibiendo quimioterapia. ? Usa medicamentos que reducen (suprimen) la actividad de su sistema inmunitario.  Tiene una enfermedad a largo plazo (crnica), como una enfermedad cardaca, enfermedad renal, diabetes o enfermedad pulmonar.  Tiene un trastorno heptico.  Tiene mucho sobrepeso (obesidad  mrbida).  Tiene anemia. Esta es una afeccin que afecta a los glbulos rojos.  Tiene asma. Cules son los signos o los sntomas? Los sntomas de esta afeccin por lo general comienzan de repente y duran entre 4 y 14das. Pueden incluir los siguientes:  Fiebre y escalofros.  Dolores de cabeza, dolores en el cuerpo o dolores musculares.  Dolor de garganta.  Tos.  Secrecin o congestin nasal.  Malestar en el pecho.  Falta de apetito.  Debilidad o fatiga.  Mareos.  Nuseas o vmitos. Cmo se diagnostica? Esta afeccin se puede diagnosticar en funcin de lo siguiente:  Los sntomas y los antecedentes mdicos.  Un examen fsico.  Un hisopado de nariz o garganta y el anlisis del lquido extrado para detectar el virus de la gripe. Cmo se trata? Si la gripe se diagnostica de forma temprana, puede tratarse con medicamentos que pueden ayudar a reducir la gravedad de la enfermedad y reducir su duracin (medicamentos antivirales). Estos pueden administrarse por boca (va oral) o por va intravenosa. Cuidarse en su hogar tambin puede ayudar a aliviar los sntomas. El mdico puede recomendarle lo siguiente:  Tomar medicamentos de venta libre.  Beber mucho lquido. En muchos casos, la gripe desaparece sola. Si tiene sntomas graves o complicaciones, puede tratarse en un hospital. Siga estas indicaciones en su casa: Actividad  Descanse segn sea necesario y duerma bien.  Qudese en su casa y no concurra al trabajo o a la escuela como se lo haya indicado su mdico. A menos que visite al mdico, evite salir de su casa hasta que la fiebre haya desaparecido por 24 horas sin tomar medicamentos. Comida y bebida  Tome una solucin de rehidratacin oral (  oral rehydration solution, ORS). Esta es una bebida que se vende en farmacias y tiendas minoristas.  Beba suficiente lquido como para mantener la orina de color amarillo plido.  En la medida en que pueda, beba lquidos  claros en pequeas cantidades. Beba lquidos claros, como agua, cubitos de hielo, jugos de fruta diluidos y bebidas deportivas bajas en caloras.  En la medida en que pueda, consuma alimentos blandos y fciles de digerir en pequeas cantidades. Estos alimentos incluyen bananas, compota de manzana, arroz, carnes magras, tostadas y galletas saladas.  Evite consumir lquidos que contengan mucha azcar o cafena, como bebidas energticas, bebidas deportivas comunes y refrescos.  Evite tomar alcohol.  Evite los alimentos condimentados o con alto contenido de grasa. Indicaciones generales      Tome los medicamentos de venta libre y los recetados solamente como se lo haya indicado el mdico.  Use un humidificador de aire fro para agregar humedad al aire de su casa. Esto puede facilitar la respiracin.  Al toser o estornudar, cbrase la boca y la nariz.  Lvese las manos con agua y jabn frecuentemente, en especial despus de toser o estornudar. Use desinfectante para manos con alcohol si no dispone de agua y jabn.  Concurra a todas las visitas de control como se lo haya indicado el mdico. Esto es importante. Cmo se evita?   Colquese la vacuna anual contra la gripe. Puede colocarse la vacuna contra la gripe a fines de verano, en otoo o en invierno. Pregntele al mdico cundo debe colocarse la vacuna contra la gripe.  Evite el contacto con personas que estn enfermas durante la temporada de resfro y gripe. Generalmente es durante el otoo y el invierno. Comunquese con un mdico si:  Tiene nuevos sntomas.  Tiene los siguientes sntomas: ? Dolor en el pecho. ? Diarrea. ? Fiebre.  La tos empeora.  Produce ms mucosidad.  Siente nuseas o vomita. Solicite ayuda inmediatamente si:  Le falta el aire o tiene dificultad para respirar.  La piel o las uas se tornan de un color azulado.  Presenta dolor intenso o rigidez de cuello.  Le duele la cabeza, la cara o el odo de  forma repentina.  No puede comer ni beber sin vomitar. Resumen  La gripe es una infeccin viral que afecta principalmente las vas respiratorias.  Los sntomas de la gripe normalmente comienzan de repente y duran entre 4 y 14 das.  Colocarse la vacuna anual contra la gripe es la mejor manera de prevenir el contagio de la gripe.  Qudese en su casa y no concurra al trabajo o a la escuela como se lo haya indicado su mdico. A menos que visite al mdico, evite salir de su casa hasta que la fiebre haya desaparecido por 24 horas sin tomar medicamentos.  Concurra a todas las visitas de control como se lo haya indicado el mdico. Esto es importante. Esta informacin no tiene como fin reemplazar el consejo del mdico. Asegrese de hacerle al mdico cualquier pregunta que tenga. Document Released: 06/06/2005 Document Revised: 04/09/2018 Document Reviewed: 04/09/2018 Elsevier Interactive Patient Education  2019 [employer]  

## 2018-10-06 ENCOUNTER — Ambulatory Visit (HOSPITAL_COMMUNITY)
Admission: RE | Admit: 2018-10-06 | Discharge: 2018-10-06 | Disposition: A | Discharge status: Home / Self Care | Payer: Self-pay | Source: Ambulatory Visit | Attending: Nurse Practitioner | Admitting: Nurse Practitioner

## 2018-10-06 ENCOUNTER — Other Ambulatory Visit (HOSPITAL_COMMUNITY): Payer: Self-pay | Admitting: *Deleted

## 2018-10-06 ENCOUNTER — Ambulatory Visit (HOSPITAL_COMMUNITY): Payer: Self-pay | Admitting: Nurse Practitioner

## 2018-10-06 ENCOUNTER — Encounter (HOSPITAL_COMMUNITY): Payer: Self-pay

## 2018-10-06 DIAGNOSIS — Z363 Encounter for antenatal screening for malformations: Secondary | ICD-10-CM

## 2018-10-06 DIAGNOSIS — O09523 Supervision of elderly multigravida, third trimester: Secondary | ICD-10-CM

## 2018-10-06 DIAGNOSIS — Z3687 Encounter for antenatal screening for uncertain dates: Secondary | ICD-10-CM

## 2018-10-06 DIAGNOSIS — O09522 Supervision of elderly multigravida, second trimester: Secondary | ICD-10-CM

## 2018-10-06 DIAGNOSIS — O99213 Obesity complicating pregnancy, third trimester: Secondary | ICD-10-CM

## 2018-10-06 DIAGNOSIS — O409XX0 Polyhydramnios, unspecified trimester, not applicable or unspecified: Secondary | ICD-10-CM

## 2018-10-06 DIAGNOSIS — Z3689 Encounter for other specified antenatal screening: Secondary | ICD-10-CM | POA: Insufficient documentation

## 2018-10-06 DIAGNOSIS — Z3A28 28 weeks gestation of pregnancy: Secondary | ICD-10-CM

## 2018-10-11 ENCOUNTER — Other Ambulatory Visit (HOSPITAL_COMMUNITY): Payer: Self-pay | Admitting: Nurse Practitioner

## 2018-10-20 ENCOUNTER — Ambulatory Visit (INDEPENDENT_AMBULATORY_CARE_PROVIDER_SITE_OTHER): Payer: Self-pay | Admitting: Family Medicine

## 2018-10-20 ENCOUNTER — Encounter: Payer: Self-pay | Admitting: Family Medicine

## 2018-10-20 VITALS — BP 103/61 | HR 83 | Wt 216.1 lb

## 2018-10-20 DIAGNOSIS — O09523 Supervision of elderly multigravida, third trimester: Secondary | ICD-10-CM | POA: Insufficient documentation

## 2018-10-20 DIAGNOSIS — O403XX0 Polyhydramnios, third trimester, not applicable or unspecified: Secondary | ICD-10-CM | POA: Insufficient documentation

## 2018-10-20 DIAGNOSIS — R87610 Atypical squamous cells of undetermined significance on cytologic smear of cervix (ASC-US): Secondary | ICD-10-CM | POA: Insufficient documentation

## 2018-10-20 DIAGNOSIS — Z3A30 30 weeks gestation of pregnancy: Secondary | ICD-10-CM

## 2018-10-20 DIAGNOSIS — O0990 Supervision of high risk pregnancy, unspecified, unspecified trimester: Secondary | ICD-10-CM | POA: Insufficient documentation

## 2018-10-20 DIAGNOSIS — O0993 Supervision of high risk pregnancy, unspecified, third trimester: Secondary | ICD-10-CM

## 2018-10-20 HISTORY — DX: Atypical squamous cells of undetermined significance on cytologic smear of cervix (ASC-US): R87.610

## 2018-10-20 MED ORDER — ASPIRIN EC 81 MG PO TBEC: 81.0000 mg | DELAYED_RELEASE_TABLET | Freq: Every day | ORAL | 2 refills | Status: DC

## 2018-10-20 NOTE — Progress Notes (Signed)
Subjective:  [name] is a [id number] [redacted]w[redacted]d being seen today for transfer from [facility].  Her obstetrical history is significant for advanced maternal age and Polyhydramnios and ASCUS with neg HPV. Patient does intend to breast feed. Pregnancy history fully reviewed.  Patient reports no complaints.  BP 103/61   Pulse 83   Wt 216 lb 1.6 oz (98 kg)   LMP 03/03/2018   BMI 40.83 kg/m   HISTORY: OB History  Gravida Para Term Preterm AB Living  8 4 4   3 4   SAB TAB Ectopic Multiple Live Births  1 2     4     # Outcome Date GA Lbr Len/2nd Weight Sex Delivery Anes PTL Lv  8 Current           7 SAB 11/2017          6 TAB 2016          5 Term 01/08/09 [redacted]w[redacted]d   F Vag-Spont   LIV  4 Term 04/23/07 [redacted]w[redacted]d   M Vag-Spont   LIV  3 Term 04/10/02 [redacted]w[redacted]d   F Vag-Spont   LIV  2 Term 03/30/98 [redacted]w[redacted]d   M Vag-Spont   LIV  1 TAB             Past Medical History:  Diagnosis Date  . Anxiety    relative to circumstances to back problems/injury, not taking any medicine, pt. reports has been crying a lot  . Arthritis    low back  . Asthma   . Depression   . GERD (gastroesophageal reflux disease)   . Migraines   . Neck pain     Past Surgical History:  Procedure Laterality Date  . childbirth     x4 vaginal   . LUMBAR LAMINECTOMY/DECOMPRESSION MICRODISCECTOMY  06/05/2012   Procedure: LUMBAR LAMINECTOMY/DECOMPRESSION MICRODISCECTOMY;  Surgeon: Emilee Hero, MD;  Location: [facility] OR;  Service: Orthopedics;  Laterality: Left;  Left sided lumbar 4-5 microdisectomy    Family History  Problem Relation Age of Onset  . Asthma Brother      Exam  BP 103/61   Pulse 83   Wt 216 lb 1.6 oz (98 kg)   LMP 03/03/2018   BMI 40.83 kg/m   CONSTITUTIONAL: Well-developed, well-nourished female in no acute distress.  HENT:  Normocephalic, atraumatic, External right and left ear normal. Oropharynx is clear and moist EYES: Conjunctivae and EOM are normal. Pupils are equal, round, and reactive to light. No  scleral icterus.  NECK: Normal range of motion, supple, no masses.  Normal thyroid.  CARDIOVASCULAR: Normal heart rate noted, regular rhythm RESPIRATORY: Clear to auscultation bilaterally. Effort and breath sounds normal, no problems with respiration noted. ABDOMEN: Soft, normal bowel sounds, no distention noted.  No tenderness, rebound or guarding.  MUSCULOSKELETAL: Normal range of motion. No tenderness.  No cyanosis, clubbing, or edema.  2+ distal pulses. SKIN: Skin is warm and dry. No rash noted. Not diaphoretic. No erythema. No pallor. NEUROLOGIC: Alert and oriented to person, place, and time. Normal reflexes, muscle tone coordination. No cranial nerve deficit noted. PSYCHIATRIC: Normal mood and affect. Normal behavior. Normal judgment and thought content.    Assessment:    Pregnancy: [id number] Patient Active Problem List   Diagnosis Date Noted  . Supervision of high risk pregnancy, antepartum 10/20/2018  . PTSD (post-traumatic stress disorder) 05/29/2013  . Depression with suicidal ideation 05/29/2013  . Suicide attempt by multiple drug overdose 05/29/2013  . Chronic pain following surgery or procedure 05/29/2013  .  HNP (herniated nucleus pulposus), lumbar 02/04/2013      Plan:   1. Supervision of high risk pregnancy, antepartum FHT and FH normal  2. Multigravida of advanced maternal age in third trimester ASA 81mg   3. ASCUS of cervix with negative high risk HPV Rpt pap in 3 years  4. Polyhydramnios in third trimester complication, single or unspecified fetus F/u US on 2/26.     Problem list reviewed and updated. 75% of 20 min visit spent on counseling and coordination of care.     Levie Heritage 10/20/2018

## 2018-10-21 ENCOUNTER — Encounter: Payer: Self-pay | Admitting: Emergency Medicine

## 2018-11-05 ENCOUNTER — Other Ambulatory Visit (HOSPITAL_COMMUNITY): Payer: Self-pay | Admitting: *Deleted

## 2018-11-05 ENCOUNTER — Ambulatory Visit (HOSPITAL_COMMUNITY): Payer: Self-pay | Admitting: *Deleted

## 2018-11-05 ENCOUNTER — Encounter (HOSPITAL_COMMUNITY): Payer: Self-pay

## 2018-11-05 ENCOUNTER — Ambulatory Visit (HOSPITAL_COMMUNITY)
Admission: RE | Admit: 2018-11-05 | Discharge: 2018-11-05 | Disposition: A | Discharge status: Home / Self Care | Payer: Self-pay | Source: Ambulatory Visit | Attending: Nurse Practitioner | Admitting: Nurse Practitioner

## 2018-11-05 DIAGNOSIS — O09529 Supervision of elderly multigravida, unspecified trimester: Secondary | ICD-10-CM

## 2018-11-05 DIAGNOSIS — O0990 Supervision of high risk pregnancy, unspecified, unspecified trimester: Secondary | ICD-10-CM | POA: Insufficient documentation

## 2018-11-05 DIAGNOSIS — O09523 Supervision of elderly multigravida, third trimester: Secondary | ICD-10-CM

## 2018-11-05 DIAGNOSIS — O99213 Obesity complicating pregnancy, third trimester: Secondary | ICD-10-CM

## 2018-11-05 DIAGNOSIS — Z362 Encounter for other antenatal screening follow-up: Secondary | ICD-10-CM

## 2018-11-05 DIAGNOSIS — O403XX0 Polyhydramnios, third trimester, not applicable or unspecified: Secondary | ICD-10-CM

## 2018-11-05 DIAGNOSIS — O09522 Supervision of elderly multigravida, second trimester: Secondary | ICD-10-CM | POA: Insufficient documentation

## 2018-11-05 DIAGNOSIS — O409XX0 Polyhydramnios, unspecified trimester, not applicable or unspecified: Secondary | ICD-10-CM | POA: Insufficient documentation

## 2018-11-05 DIAGNOSIS — Z3A32 32 weeks gestation of pregnancy: Secondary | ICD-10-CM

## 2018-11-07 ENCOUNTER — Ambulatory Visit (INDEPENDENT_AMBULATORY_CARE_PROVIDER_SITE_OTHER): Payer: Self-pay | Admitting: Obstetrics & Gynecology

## 2018-11-07 VITALS — BP 113/72 | HR 87 | Wt 218.6 lb

## 2018-11-07 DIAGNOSIS — O09523 Supervision of elderly multigravida, third trimester: Secondary | ICD-10-CM

## 2018-11-07 DIAGNOSIS — O99213 Obesity complicating pregnancy, third trimester: Secondary | ICD-10-CM

## 2018-11-07 DIAGNOSIS — O0990 Supervision of high risk pregnancy, unspecified, unspecified trimester: Secondary | ICD-10-CM

## 2018-11-07 DIAGNOSIS — O403XX3 Polyhydramnios, third trimester, fetus 3: Secondary | ICD-10-CM

## 2018-11-07 DIAGNOSIS — O0993 Supervision of high risk pregnancy, unspecified, third trimester: Secondary | ICD-10-CM

## 2018-11-07 DIAGNOSIS — O403XX0 Polyhydramnios, third trimester, not applicable or unspecified: Secondary | ICD-10-CM

## 2018-11-07 DIAGNOSIS — F419 Anxiety disorder, unspecified: Secondary | ICD-10-CM

## 2018-11-07 DIAGNOSIS — O99210 Obesity complicating pregnancy, unspecified trimester: Secondary | ICD-10-CM

## 2018-11-07 DIAGNOSIS — Z3A33 33 weeks gestation of pregnancy: Secondary | ICD-10-CM

## 2018-11-07 NOTE — Progress Notes (Signed)
   PRENATAL VISIT NOTE  Subjective:  [name] is a 38 y.o. [id number] at [redacted]w[redacted]d being seen today for ongoing prenatal care.  She is currently monitored for the following issues for this high-risk pregnancy and has HNP (herniated nucleus pulposus), lumbar; PTSD (post-traumatic stress disorder); Depression with suicidal ideation; Suicide attempt by multiple drug overdose; Chronic pain following surgery or procedure; Supervision of high risk pregnancy, antepartum; Multigravida of advanced maternal age in third trimester; ASCUS of cervix with negative high risk HPV; Polyhydramnios in third trimester; and Obesity in pregnancy on their problem list.  Patient reports no complaints except for feeling anxious   .  .   . Denies leaking of fluid.   The following portions of the patient's history were reviewed and updated as appropriate: allergies, current medications, past family history, past medical history, past social history, past surgical history and problem list. Problem list updated.  Objective:  There were no vitals filed for this visit.  Fetal Status:           General:  Alert, oriented and cooperative. Patient is in no acute distress.  Skin: Skin is warm and dry. No rash noted.   Cardiovascular: Normal heart rate noted  Respiratory: Normal respiratory effort, no problems with respiration noted  Abdomen: Soft, gravid, appropriate for gestational age.        Pelvic:  deferred  Extremities: Normal range of motion.     Mental Status: Normal mood and affect. Normal behavior. Normal judgment and thought content.   Assessment and Plan:  Pregnancy: [id number] at [redacted]w[redacted]d  1. Polyhydramnios in third trimester complication, single or unspecified fetus - resolved - a repeat MFM u/s is scheduled 11-12-18  2. Multigravida of advanced maternal age in third trimester  3. Supervision of high risk pregnancy, antepartum   4. Obesity in pregnancy  5. Anxiety- she will see Asher Muir next week  Preterm labor  symptoms and general obstetric precautions including but not limited to vaginal bleeding, contractions, leaking of fluid and fetal movement were reviewed in detail with the patient. Please refer to After Visit Summary for other counseling recommendations.  No follow-ups on file.  Future Appointments  Date Time Provider Department Center  11/12/2018  8:30 AM [facility] NURSE [facility]  11/12/2018  8:45 AM [facility] [place] 2 [facility]-US  12/03/2018  8:35 AM [facility] NURSE [facility]  12/03/2018  8:45 AM [facility]-M[facility] [place] 5 [facility]-M[facility] M[facility]-US    Allie Bossier, MD

## 2018-11-12 ENCOUNTER — Ambulatory Visit (HOSPITAL_COMMUNITY)
Admission: RE | Admit: 2018-11-12 | Discharge: 2018-11-12 | Disposition: A | Discharge status: Home / Self Care | Payer: Self-pay | Source: Ambulatory Visit | Attending: Obstetrics and Gynecology | Admitting: Obstetrics and Gynecology

## 2018-11-12 ENCOUNTER — Encounter (HOSPITAL_COMMUNITY): Payer: Self-pay

## 2018-11-12 ENCOUNTER — Ambulatory Visit (HOSPITAL_COMMUNITY): Payer: Self-pay | Admitting: *Deleted

## 2018-11-12 VITALS — BP 98/65 | HR 89 | Wt 216.6 lb

## 2018-11-12 DIAGNOSIS — O0990 Supervision of high risk pregnancy, unspecified, unspecified trimester: Secondary | ICD-10-CM | POA: Insufficient documentation

## 2018-11-12 DIAGNOSIS — Z3A33 33 weeks gestation of pregnancy: Secondary | ICD-10-CM

## 2018-11-12 DIAGNOSIS — O403XX0 Polyhydramnios, third trimester, not applicable or unspecified: Secondary | ICD-10-CM

## 2018-11-12 DIAGNOSIS — O99213 Obesity complicating pregnancy, third trimester: Secondary | ICD-10-CM

## 2018-11-12 DIAGNOSIS — O09529 Supervision of elderly multigravida, unspecified trimester: Secondary | ICD-10-CM | POA: Insufficient documentation

## 2018-11-12 DIAGNOSIS — O09523 Supervision of elderly multigravida, third trimester: Secondary | ICD-10-CM

## 2018-11-12 NOTE — Progress Notes (Signed)
Pt reports leaking small amount of clear fluid since last week.

## 2018-11-14 ENCOUNTER — Institutional Professional Consult (permissible substitution): Payer: Self-pay

## 2018-11-21 ENCOUNTER — Encounter: Payer: Self-pay | Admitting: Obstetrics and Gynecology

## 2018-11-21 ENCOUNTER — Ambulatory Visit (INDEPENDENT_AMBULATORY_CARE_PROVIDER_SITE_OTHER): Payer: Self-pay | Admitting: Obstetrics and Gynecology

## 2018-11-21 ENCOUNTER — Other Ambulatory Visit: Payer: Self-pay

## 2018-11-21 ENCOUNTER — Encounter: Payer: Self-pay | Admitting: Family Medicine

## 2018-11-21 VITALS — BP 100/72 | HR 101 | Wt 217.6 lb

## 2018-11-21 DIAGNOSIS — Z603 Acculturation difficulty: Secondary | ICD-10-CM

## 2018-11-21 DIAGNOSIS — O0990 Supervision of high risk pregnancy, unspecified, unspecified trimester: Secondary | ICD-10-CM

## 2018-11-21 DIAGNOSIS — Z3A35 35 weeks gestation of pregnancy: Secondary | ICD-10-CM

## 2018-11-21 DIAGNOSIS — Z789 Other specified health status: Secondary | ICD-10-CM | POA: Insufficient documentation

## 2018-11-21 DIAGNOSIS — O99213 Obesity complicating pregnancy, third trimester: Secondary | ICD-10-CM

## 2018-11-21 DIAGNOSIS — O09523 Supervision of elderly multigravida, third trimester: Secondary | ICD-10-CM

## 2018-11-21 DIAGNOSIS — O99210 Obesity complicating pregnancy, unspecified trimester: Secondary | ICD-10-CM

## 2018-11-21 NOTE — Progress Notes (Signed)
   PRENATAL VISIT NOTE  Subjective:  [name] is a 38 y.o. [id number] at [redacted]w[redacted]d being seen today for ongoing prenatal care.  She is currently monitored for the following issues for this low-risk pregnancy and has HNP (herniated nucleus pulposus), lumbar; PTSD (post-traumatic stress disorder); Depression with suicidal ideation; Suicide attempt by multiple drug overdose; Chronic pain following surgery or procedure; Supervision of high risk pregnancy, antepartum; Multigravida of advanced maternal age in third trimester; ASCUS of cervix with negative high risk HPV; Obesity in pregnancy; and Language barrier on their problem list.  Patient reports no complaints.  Contractions: Irregular. Vag. Bleeding: None.  Movement: Present. Denies leaking of fluid.   The following portions of the patient's history were reviewed and updated as appropriate: allergies, current medications, past family history, past medical history, past social history, past surgical history and problem list.   Objective:   Vitals:   11/21/18 1044  BP: 100/72  Pulse: (!) 101  Weight: 217 lb 9.6 oz (98.7 kg)    Fetal Status: Fetal Heart Rate (bpm): 136   Movement: Present     General:  Alert, oriented and cooperative. Patient is in no acute distress.  Skin: Skin is warm and dry. No rash noted.   Cardiovascular: Normal heart rate noted  Respiratory: Normal respiratory effort, no problems with respiration noted  Abdomen: Soft, gravid, appropriate for gestational age.  Pain/Pressure: Present     Pelvic: Cervical exam deferred        Extremities: Normal range of motion.  Edema: None  Mental Status: Normal mood and affect. Normal behavior. Normal judgment and thought content.   Assessment and Plan:  Pregnancy: [id number] at [redacted]w[redacted]d 1. Supervision of high risk pregnancy, antepartum Varicella vaccine pp  2. Multigravida of advanced maternal age in third trimester  3. Obesity in pregnancy Cont baby ASA  4. Polyhydramnios Now  resolved Repeat growth 3/25  4. Language barrier Engineer, structural used   Preterm labor symptoms and general obstetric precautions including but not limited to vaginal bleeding, contractions, leaking of fluid and fetal movement were reviewed in detail with the patient. Please refer to After Visit Summary for other counseling recommendations.   Return in about 1 week (around 11/28/2018) for OB visit.  Future Appointments  Date Time Provider Department Center  12/03/2018  8:35 AM [facility] NURSE [facility]  12/03/2018  8:45 AM [facility] [place] 5 [facility]-M[facility] MFC-US    Conan Bowens, MD

## 2018-11-27 ENCOUNTER — Encounter: Payer: Self-pay | Admitting: Obstetrics and Gynecology

## 2018-11-27 ENCOUNTER — Ambulatory Visit (INDEPENDENT_AMBULATORY_CARE_PROVIDER_SITE_OTHER): Payer: Self-pay | Admitting: Obstetrics and Gynecology

## 2018-11-27 ENCOUNTER — Other Ambulatory Visit: Payer: Self-pay

## 2018-11-27 VITALS — BP 113/67 | HR 93 | Temp 98.3°F | Wt 218.0 lb

## 2018-11-27 DIAGNOSIS — O09523 Supervision of elderly multigravida, third trimester: Secondary | ICD-10-CM

## 2018-11-27 DIAGNOSIS — O99213 Obesity complicating pregnancy, third trimester: Secondary | ICD-10-CM

## 2018-11-27 DIAGNOSIS — Z3A35 35 weeks gestation of pregnancy: Secondary | ICD-10-CM

## 2018-11-27 DIAGNOSIS — O99210 Obesity complicating pregnancy, unspecified trimester: Secondary | ICD-10-CM

## 2018-11-27 DIAGNOSIS — O0990 Supervision of high risk pregnancy, unspecified, unspecified trimester: Secondary | ICD-10-CM

## 2018-11-27 DIAGNOSIS — Z8759 Personal history of other complications of pregnancy, childbirth and the puerperium: Secondary | ICD-10-CM | POA: Insufficient documentation

## 2018-11-27 DIAGNOSIS — Z789 Other specified health status: Secondary | ICD-10-CM

## 2018-11-27 DIAGNOSIS — Z603 Acculturation difficulty: Secondary | ICD-10-CM

## 2018-11-27 DIAGNOSIS — Z6841 Body Mass Index (BMI) 40.0 and over, adult: Secondary | ICD-10-CM

## 2018-11-27 NOTE — Progress Notes (Signed)
Prenatal Visit Note Date: 11/27/2018 Clinic: [facility]  Subjective:  [name] is a 38 y.o. [id number] at [redacted]w[redacted]d being seen today for ongoing prenatal care.  She is currently monitored for the following issues for this high-risk pregnancy and has HNP (herniated nucleus pulposus), lumbar; PTSD (post-traumatic stress disorder); Depression with suicidal ideation; Suicide attempt by multiple drug overdose; Chronic pain following surgery or procedure; Supervision of high risk pregnancy, antepartum; Multigravida of advanced maternal age in third trimester; ASCUS of cervix with negative high risk HPV; Obesity in pregnancy; Language barrier; and History of polyhydramnios on their problem list.  Patient reports no complaints.   Contractions: Irregular. Vag. Bleeding: None.  Movement: Present. Denies leaking of fluid.   The following portions of the patient's history were reviewed and updated as appropriate: allergies, current medications, past family history, past medical history, past social history, past surgical history and problem list. Problem list updated.  Objective:   Vitals:   11/27/18 1544  BP: 113/67  Pulse: 93  Temp: 98.3 F (36.8 C)  Weight: 218 lb (98.9 kg)    Fetal Status: Fetal Heart Rate (bpm): 150 Fundal Height: 36 cm Movement: Present  Presentation: Vertex  General:  Alert, oriented and cooperative. Patient is in no acute distress.  Skin: Skin is warm and dry. No rash noted.   Cardiovascular: Normal heart rate noted  Respiratory: Normal respiratory effort, no problems with respiration noted  Abdomen: Soft, gravid, appropriate for gestational age. Pain/Pressure: Present     Pelvic:  Cervical exam deferred        Extremities: Normal range of motion.  Edema: None  Mental Status: Normal mood and affect. Normal behavior. Normal judgment and thought content.   Urinalysis:      Assessment and Plan:  Pregnancy: [id number] at [redacted]w[redacted]d  1. Supervision of high  risk pregnancy, antepartum Routine care. Depo. gbs nv   2. History of polyhydramnios resolved  3. Language barrier Interpreter used  4. Obesity in pregnancy Declines rpt u/s  5. BMI 40.0-44.9, adult (HCC)  6. Multigravida of advanced maternal age in third trimester  Preterm labor symptoms and general obstetric precautions including but not limited to vaginal bleeding, contractions, leaking of fluid and fetal movement were reviewed in detail with the patient. Please refer to After Visit Summary for other counseling recommendations.  Return in about 1 week (around 12/04/2018) for 7-10d rob, gbs swab.   [place] Bing, MD

## 2018-12-02 ENCOUNTER — Telehealth: Payer: Self-pay | Admitting: Student

## 2018-12-02 NOTE — Telephone Encounter (Signed)
Called the patient to explain our restrictions to the COV19 and procedure with checking in. No questions at this time.

## 2018-12-03 ENCOUNTER — Ambulatory Visit (INDEPENDENT_AMBULATORY_CARE_PROVIDER_SITE_OTHER): Payer: Self-pay | Admitting: Obstetrics and Gynecology

## 2018-12-03 ENCOUNTER — Other Ambulatory Visit: Payer: Self-pay

## 2018-12-03 ENCOUNTER — Encounter: Payer: Self-pay | Admitting: Obstetrics and Gynecology

## 2018-12-03 ENCOUNTER — Ambulatory Visit (HOSPITAL_COMMUNITY): Admission: RE | Admit: 2018-12-03 | Payer: Self-pay | Source: Ambulatory Visit

## 2018-12-03 ENCOUNTER — Ambulatory Visit (HOSPITAL_COMMUNITY): Payer: Self-pay

## 2018-12-03 VITALS — BP 109/75 | HR 98 | Temp 97.9°F | Wt 222.3 lb

## 2018-12-03 DIAGNOSIS — B373 Candidiasis of vulva and vagina: Secondary | ICD-10-CM

## 2018-12-03 DIAGNOSIS — N898 Other specified noninflammatory disorders of vagina: Secondary | ICD-10-CM

## 2018-12-03 DIAGNOSIS — O09523 Supervision of elderly multigravida, third trimester: Secondary | ICD-10-CM

## 2018-12-03 DIAGNOSIS — O0990 Supervision of high risk pregnancy, unspecified, unspecified trimester: Secondary | ICD-10-CM

## 2018-12-03 DIAGNOSIS — Z789 Other specified health status: Secondary | ICD-10-CM

## 2018-12-03 DIAGNOSIS — Z3A36 36 weeks gestation of pregnancy: Secondary | ICD-10-CM

## 2018-12-03 DIAGNOSIS — Z113 Encounter for screening for infections with a predominantly sexual mode of transmission: Secondary | ICD-10-CM

## 2018-12-03 NOTE — Progress Notes (Signed)
   PRENATAL VISIT NOTE  Subjective:  [name] is a 38 y.o. [id number] at [redacted]w[redacted]d being seen today for ongoing prenatal care.  She is currently monitored for the following issues for this high-risk pregnancy and has HNP (herniated nucleus pulposus), lumbar; PTSD (post-traumatic stress disorder); Depression with suicidal ideation; Suicide attempt by multiple drug overdose; Chronic pain following surgery or procedure; Supervision of high risk pregnancy, antepartum; Multigravida of advanced maternal age in third trimester; ASCUS of cervix with negative high risk HPV; Obesity in pregnancy; Language barrier; and History of polyhydramnios on their problem list.  Patient reports vaginal pruritis.  Contractions: Irregular. Vag. Bleeding: None.  Movement: Present. Denies leaking of fluid.   The following portions of the patient's history were reviewed and updated as appropriate: allergies, current medications, past family history, past medical history, past social history, past surgical history and problem list.   Objective:   Vitals:   12/03/18 1047  BP: 109/75  Pulse: 98  Temp: 97.9 F (36.6 C)  Weight: 222 lb 4.8 oz (100.8 kg)    Fetal Status: Fetal Heart Rate (bpm): 140 Fundal Height: 38 cm Movement: Present  Presentation: Vertex  General:  Alert, oriented and cooperative. Patient is in no acute distress.  Skin: Skin is warm and dry. No rash noted.   Cardiovascular: Normal heart rate noted  Respiratory: Normal respiratory effort, no problems with respiration noted  Abdomen: Soft, gravid, appropriate for gestational age.  Pain/Pressure: Present     Pelvic: Cervical exam performed Dilation: Fingertip Effacement (%): Thick Station: Ballotable  Extremities: Normal range of motion.  Edema: None  Mental Status: Normal mood and affect. Normal behavior. Normal judgment and thought content.   Assessment and Plan:  Pregnancy: [id number] at [redacted]w[redacted]d 1. Supervision of high risk pregnancy, antepartum  Patient is doing well Cultures today - Culture, beta strep (group b only) - Cervicovaginal ancillary only( [facility])  2. Multigravida of advanced maternal age in third trimester - Culture, beta strep (group b only)  3. Language barrier Spanish interpreter present  Preterm labor symptoms and general obstetric precautions including but not limited to vaginal bleeding, contractions, leaking of fluid and fetal movement were reviewed in detail with the patient. Please refer to After Visit Summary for other counseling recommendations.   No follow-ups on file.  No future appointments.  Catalina Antigua, MD

## 2018-12-04 ENCOUNTER — Telehealth: Payer: Self-pay | Admitting: *Deleted

## 2018-12-04 ENCOUNTER — Other Ambulatory Visit: Payer: Self-pay | Admitting: Obstetrics and Gynecology

## 2018-12-04 LAB — CERVICOVAGINAL ANCILLARY ONLY
Bacterial vaginitis: NEGATIVE
CANDIDA VAGINITIS: POSITIVE — AB
Chlamydia: NEGATIVE
NEISSERIA GONORRHEA: NEGATIVE
Trichomonas: NEGATIVE

## 2018-12-04 MED ORDER — FLUCONAZOLE 150 MG PO TABS: 150.0000 mg | ORAL_TABLET | Freq: Once | ORAL | 0 refills | Status: AC

## 2018-12-04 NOTE — Telephone Encounter (Signed)
Interpreter [phone] used to call pt and inform her that she has a yeast infection and that  Diflucan was sent to the [facility] on [address]  Pt did not pick up. Voicemail left informing the pt that she was contacted regarding results and requesting that she contact the clinic to discuss.

## 2018-12-04 NOTE — Telephone Encounter (Signed)
-----   Message from Catalina Antigua, MD sent at 12/04/2018  3:04 PM EDT ----- Please inform patient of yeast infection. Rx has been e-prescribed

## 2018-12-05 NOTE — Telephone Encounter (Signed)
Interpreter [phone] used to call pt and advise patient that a prescription was scribed to her pharmacy and she needs to pick it up. Pt verbalized understanding.

## 2018-12-07 LAB — CULTURE, BETA STREP (GROUP B ONLY): Strep Gp B Culture: NEGATIVE

## 2018-12-09 ENCOUNTER — Telehealth: Payer: Self-pay | Admitting: Obstetrics & Gynecology

## 2018-12-09 NOTE — Telephone Encounter (Signed)
Called patient with interpreter to see if she has access to a cuff. She stated she did not, so she was moved to another provider per Dr Erin Fulling.

## 2018-12-10 ENCOUNTER — Inpatient Hospital Stay (HOSPITAL_COMMUNITY)
Admission: AD | Admit: 2018-12-10 | Discharge: 2018-12-10 | Disposition: A | Discharge status: Home / Self Care | Payer: Self-pay | Attending: Obstetrics & Gynecology | Admitting: Obstetrics & Gynecology

## 2018-12-10 ENCOUNTER — Encounter (HOSPITAL_COMMUNITY): Payer: Self-pay

## 2018-12-10 ENCOUNTER — Other Ambulatory Visit: Payer: Self-pay

## 2018-12-10 DIAGNOSIS — O0990 Supervision of high risk pregnancy, unspecified, unspecified trimester: Secondary | ICD-10-CM

## 2018-12-10 DIAGNOSIS — O479 False labor, unspecified: Secondary | ICD-10-CM

## 2018-12-10 DIAGNOSIS — Z789 Other specified health status: Secondary | ICD-10-CM

## 2018-12-10 DIAGNOSIS — O99210 Obesity complicating pregnancy, unspecified trimester: Secondary | ICD-10-CM

## 2018-12-10 DIAGNOSIS — O471 False labor at or after 37 completed weeks of gestation: Secondary | ICD-10-CM | POA: Insufficient documentation

## 2018-12-10 NOTE — MAU Note (Signed)
Contractions started at 1700, was every 15, early this morning q74min, now every 20. No bleeding or leaking.  Report hi risk preg "too much fluid".  +FM

## 2018-12-10 NOTE — Discharge Instructions (Signed)
Contracciones de Braxton Hicks °Braxton Hicks Contractions °Las contracciones del útero pueden presentarse durante todo el embarazo, pero no siempre indican que la mujer está de parto. Es posible que usted haya tenido contracciones de práctica llamadas "contracciones de Braxton Hicks". A veces, se las confunde con el parto real. °¿Qué son las contracciones de Braxton Hicks? °Las contracciones de Braxton Hicks son espasmos que se producen en los músculos del útero antes del parto. A diferencia de las contracciones del parto verdadero, estas no producen el agrandamiento (la dilatación) ni el afinamiento del cuello uterino. Hacia el final del embarazo (entre las semanas 32 y 34), las contracciones de Braxton Hicks pueden presentarse más seguido y tornarse más intensas. A veces, resulta difícil distinguirlas del parto verdadero porque pueden ser muy molestas. No debe sentirse avergonzada si concurre al hospital con falso parto. °En ocasiones, la única forma de saber si el trabajo de parto es verdadero es que el médico determine si hay cambios en el cuello del útero. El médico le hará un examen físico y quizás le controle las contracciones. Si usted no está de parto verdadero, el examen debe indicar que el cuello uterino no está dilatado y que usted no ha roto bolsa. °Si no hay otros problemas de salud asociados con su embarazo, no habrá inconvenientes si la envían a su casa con un falso parto. Es posible que las contracciones de Braxton Hicks continúen hasta que se desencadene el parto verdadero. °Cómo diferenciar el trabajo de parto falso del verdadero °Trabajo de parto verdadero °· Las contracciones duran de 30 a 70 segundos. °· Las contracciones pueden tornarse muy regulares. °· La molestia generalmente se siente en la parte superior del útero y se extiende hacia la zona baja del abdomen y hacia la cintura. °· Las contracciones no desaparecen cuando usted camina. °· Las contracciones generalmente se hacen más  intensas y aumentan en frecuencia. °· El cuello uterino se dilata y se afina. °Parto falso °· En general, las contracciones son más cortas y no tan intensas como las del parto verdadero. °· En general, las contracciones son irregulares. °· A menudo, las contracciones se sienten en la parte delantera de la parte baja del abdomen y en la ingle. °· Las contracciones pueden desaparecer cuando usted camina o cambia de posición mientras está acostada. °· Las contracciones se vuelven más débiles y su duración es menor a medida que transcurre el tiempo. °· En general, el cuello uterino no se dilata ni se afina. °Siga estas indicaciones en su casa: ° °· Tome los medicamentos de venta libre y los recetados solamente como se lo haya indicado el médico. °· Continúe haciendo los ejercicios habituales y siga las demás indicaciones que el médico le dé. °· Coma y beba con moderación si cree que está de parto. °· Si las contracciones de Braxton Hicks le provocan incomodidad: °? Cambie de posición: si está acostada o descansando, camine; si está caminando, descanse. °? Siéntese y descanse en una bañera con agua tibia. °? Beba suficiente líquido como para mantener la orina de color amarillo pálido. La deshidratación puede provocar contracciones. °? Respire lenta y profundamente varias veces por hora. °· Vaya a todas las visitas de control prenatales y de control como se lo haya indicado el médico. Esto es importante. °Comuníquese con un médico si: °· Tiene fiebre. °· Siente dolor constante en el abdomen. °Solicite ayuda de inmediato si: °· Las contracciones se intensifican, se hacen más regulares y cercanas entre sí. °· Tiene una pérdida de líquido por la vagina. °· Elimina   una mucosidad sanguinolenta (prdida del tapn mucoso).  Tiene una hemorragia vaginal.  Tiene un dolor en la zona lumbar que nunca tuvo antes.  Siente que la cabeza del beb empuja hacia abajo y ejerce presin en la zona plvica.  El beb no se mueve tanto  como antes. Resumen  Las [employer] se presentan antes del parto se conocen como contracciones de [place] parto o contracciones de Multimedia programmer.  En general, las contracciones de [address] son ms cortas, ms dbiles, con ms tiempo entre una y [place], y menos regulares que las contracciones del parto verdadero. Las contracciones del parto verdadero se intensifican progresivamente y se tornan regulares y ms frecuentes.  Para controlar la [employer] producen las contracciones de [place], puede cambiar de posicin, darse un bao templado y Lawyer, beber mucha agua o practicar la respiracin profunda. Esta informacin no tiene Theme park manager el consejo del mdico. Asegrese de hacerle al mdico cualquier pregunta que tenga. Document Released: 04/08/2017 Document Revised: 08/19/2017 Document Reviewed: 04/08/2017 Elsevier Interactive Patient Education  2019 Elsevier Inc.  Fetal Movement Counts Patient Name: ________________________________________________ Patient Due Date: ____________________ What is a fetal movement count?  A fetal movement count is the number of times that you feel your baby move during a certain amount of time. This may also be called a fetal kick count. A fetal movement count is recommended for every pregnant woman. You may be asked to start counting fetal movements as early as week 28 of your pregnancy. Pay attention to when your baby is most active. You may notice your baby's sleep and wake cycles. You may also notice things that make your baby move more. You should do a fetal movement count:  When your baby is normally most active.  At the same time each day. A good time to count movements is while you are resting, after having something to eat and drink. How do I count fetal movements? 1. Find a quiet, comfortable area. Sit, or lie down on your side. 2. Write down the date, the start time and stop time, and the number of movements that  you felt between those two times. Take this information with you to your health care visits. 3. For 2 hours, count kicks, flutters, swishes, rolls, and jabs. You should feel at least 10 movements during 2 hours. 4. You may stop counting after you have felt 10 movements. 5. If you do not feel 10 movements in 2 hours, have something to eat and drink. Then, keep resting and counting for 1 hour. If you feel at least 4 movements during that hour, you may stop counting. Contact a health care provider if:  You feel fewer than 4 movements in 2 hours.  Your baby is not moving like he or she usually does. Date: ____________ Start time: ____________ Stop time: ____________ Movements: ____________ Date: ____________ Start time: ____________ Stop time: ____________ Movements: ____________ Date: ____________ Start time: ____________ Stop time: ____________ Movements: ____________ Date: ____________ Start time: ____________ Stop time: ____________ Movements: ____________ Date: ____________ Start time: ____________ Stop time: ____________ Movements: ____________ Date: ____________ Start time: ____________ Stop time: ____________ Movements: ____________ Date: ____________ Start time: ____________ Stop time: ____________ Movements: ____________ Date: ____________ Start time: ____________ Stop time: ____________ Movements: ____________ Date: ____________ Start time: ____________ Stop time: ____________ Movements: ____________ This information is not intended to replace advice given to you by your health care provider. Make sure you discuss any questions you have with your health care provider.  Document Released: 09/26/2006 Document Revised: 04/25/2016 Document Reviewed: 10/06/2015 Elsevier Interactive Patient Education  2019 [employer].

## 2018-12-10 NOTE — MAU Note (Signed)
I have communicated with Lanice Shirts, CNM and Dr. Aneta Mins and reviewed vital signs:  Vitals:   12/10/18 1119 12/10/18 1207  BP: 109/76 124/80  Pulse: 92   Resp: 18   Temp: 98.5 F (36.9 C)   SpO2: 99%     Vaginal exam:  Dilation: 1 Effacement (%): Thick Presentation: Vertex Exam by:: Jolaine Artist, RN,   Also reviewed contraction pattern and that non-stress test is reactive.  It has been documented that patient is contracting every 20-30 minutes verbalized by patient, no contractions were traced on monitor with minimal cervical change over 5-6 days not indicating active labor.  Patient denies any other complaints.  Based on this report provider has given order for discharge.  A discharge order and diagnosis entered by a provider.   Labor discharge instructions reviewed with patient.

## 2018-12-15 ENCOUNTER — Other Ambulatory Visit: Payer: Self-pay

## 2018-12-15 ENCOUNTER — Encounter: Payer: Self-pay | Admitting: Obstetrics & Gynecology

## 2018-12-15 ENCOUNTER — Ambulatory Visit (INDEPENDENT_AMBULATORY_CARE_PROVIDER_SITE_OTHER): Payer: Self-pay | Admitting: Obstetrics & Gynecology

## 2018-12-15 VITALS — BP 113/80 | HR 89 | Temp 97.9°F | Wt 223.5 lb

## 2018-12-15 DIAGNOSIS — O09523 Supervision of elderly multigravida, third trimester: Secondary | ICD-10-CM

## 2018-12-15 DIAGNOSIS — Z789 Other specified health status: Secondary | ICD-10-CM

## 2018-12-15 DIAGNOSIS — O0990 Supervision of high risk pregnancy, unspecified, unspecified trimester: Secondary | ICD-10-CM

## 2018-12-15 DIAGNOSIS — O99210 Obesity complicating pregnancy, unspecified trimester: Secondary | ICD-10-CM

## 2018-12-15 DIAGNOSIS — Z3A38 38 weeks gestation of pregnancy: Secondary | ICD-10-CM

## 2018-12-15 DIAGNOSIS — O99213 Obesity complicating pregnancy, third trimester: Secondary | ICD-10-CM

## 2018-12-15 NOTE — Progress Notes (Signed)
   PRENATAL VISIT NOTE  Subjective:  [name] is a 38 y.o. [id number] at [redacted]w[redacted]d being seen today for ongoing prenatal care.  She is currently monitored for the following issues for this high-risk pregnancy and has HNP (herniated nucleus pulposus), lumbar; PTSD (post-traumatic stress disorder); Depression with suicidal ideation; Suicide attempt by multiple drug overdose; Chronic pain following surgery or procedure; Supervision of high risk pregnancy, antepartum; Multigravida of advanced maternal age in third trimester; ASCUS of cervix with negative high risk HPV; Obesity in pregnancy; Language barrier; and History of polyhydramnios on their problem list.  Patient reports no complaints.  Contractions: Irritability. Vag. Bleeding: None.  Movement: Present. Denies leaking of fluid.   The following portions of the patient's history were reviewed and updated as appropriate: allergies, current medications, past family history, past medical history, past social history, past surgical history and problem list.   Objective:   Vitals:   12/15/18 1030  BP: 113/80  Pulse: 89  Temp: 97.9 F (36.6 C)  Weight: 223 lb 8 oz (101.4 kg)    Fetal Status: Fetal Heart Rate (bpm): 145   Movement: Present     General:  Alert, oriented and cooperative. Patient is in no acute distress.  Skin: Skin is warm and dry. No rash noted.   Cardiovascular: Normal heart rate noted  Respiratory: Normal respiratory effort, no problems with respiration noted  Abdomen: Soft, gravid, appropriate for gestational age.  Pain/Pressure: Present     Pelvic: Cervical exam deferred        Extremities: Normal range of motion.     Mental Status: Normal mood and affect. Normal behavior. Normal judgment and thought content.   Assessment and Plan:  Pregnancy: [id number] at [redacted]w[redacted]d 1. Language barrier - live interpretor present  2. Obesity in pregnancy  - Hemoglobin A1c - I ordered an u/s for growth, however, they will not have any  openings for the u/s until when she will already be in the hospital for IOL at 40 weeks  3. Multigravida of advanced maternal age in third trimester   4. Supervision of high risk pregnancy, antepartum    Preterm labor symptoms and general obstetric precautions including but not limited to vaginal bleeding, contractions, leaking of fluid and fetal movement were reviewed in detail with the patient. Please refer to After Visit Summary for other counseling recommendations.   No follow-ups on file.  No future appointments.  Allie Bossier, MD

## 2018-12-16 ENCOUNTER — Telehealth (HOSPITAL_COMMUNITY): Payer: Self-pay | Admitting: *Deleted

## 2018-12-16 ENCOUNTER — Encounter: Payer: Self-pay | Admitting: *Deleted

## 2018-12-16 LAB — HEMOGLOBIN A1C
Est. average glucose Bld gHb Est-mCnc: 123 mg/dL
Hgb A1c MFr Bld: 5.9 % — ABNORMAL HIGH (ref 4.8–5.6)

## 2018-12-16 NOTE — Telephone Encounter (Signed)
[id number] interpreter number  Preadmission screen

## 2018-12-19 ENCOUNTER — Other Ambulatory Visit: Payer: Self-pay | Admitting: Advanced Practice Midwife

## 2018-12-25 ENCOUNTER — Ambulatory Visit (INDEPENDENT_AMBULATORY_CARE_PROVIDER_SITE_OTHER): Payer: Self-pay | Admitting: Obstetrics & Gynecology

## 2018-12-25 ENCOUNTER — Other Ambulatory Visit: Payer: Self-pay

## 2018-12-25 ENCOUNTER — Other Ambulatory Visit (HOSPITAL_COMMUNITY): Payer: Self-pay | Admitting: *Deleted

## 2018-12-25 DIAGNOSIS — O99213 Obesity complicating pregnancy, third trimester: Secondary | ICD-10-CM

## 2018-12-25 DIAGNOSIS — Z3A39 39 weeks gestation of pregnancy: Secondary | ICD-10-CM

## 2018-12-25 DIAGNOSIS — Z789 Other specified health status: Secondary | ICD-10-CM

## 2018-12-25 DIAGNOSIS — O99210 Obesity complicating pregnancy, unspecified trimester: Secondary | ICD-10-CM

## 2018-12-25 DIAGNOSIS — O0990 Supervision of high risk pregnancy, unspecified, unspecified trimester: Secondary | ICD-10-CM

## 2018-12-25 DIAGNOSIS — O0993 Supervision of high risk pregnancy, unspecified, third trimester: Secondary | ICD-10-CM

## 2018-12-25 NOTE — Progress Notes (Signed)
   TELEHEALTH VIRTUAL OBSTETRICS VISIT ENCOUNTER NOTE  I connected with [name] on 12/25/18 at 11:15 AM EDT by telephone at home and verified that I am speaking with the correct person using two identifiers.   I discussed the limitations, risks, security and privacy concerns of performing an evaluation and management service by telephone and the availability of in person appointments. I also discussed with the patient that there may be a patient responsible charge related to this service. The patient expressed understanding and agreed to proceed.  Subjective:  [name] is a 38 y.o. [id number] at [redacted]w[redacted]d being followed for ongoing prenatal care.  She is currently monitored for the following issues for this low-risk pregnancy and has HNP (herniated nucleus pulposus), lumbar; PTSD (post-traumatic stress disorder); Depression with suicidal ideation; Suicide attempt by multiple drug overdose; Chronic pain following surgery or procedure; Supervision of high risk pregnancy, antepartum; Multigravida of advanced maternal age in third trimester; ASCUS of cervix with negative high risk HPV; Obesity in pregnancy; Language barrier; and History of polyhydramnios on their problem list.  Patient reports no complaints. Reports fetal movement. Denies any contractions, bleeding or leaking of fluid.   The following portions of the patient's history were reviewed and updated as appropriate: allergies, current medications, past family history, past medical history, past social history, past surgical history and problem list.   Objective:   General:  Alert, oriented and cooperative.   Mental Status: Normal mood and affect perceived. Normal judgment and thought content.  Rest of physical exam deferred due to type of encounter  Assessment and Plan:  Pregnancy: [id number] at [redacted]w[redacted]d 1. Language barrier - Live interpretor used for the visit  2. Supervision of high risk pregnancy, antepartum - IOL tomorrow at 0600 -  She tells me that she does not have a BP cuff  3. Obesity in pregnancy   Term labor symptoms and general obstetric precautions including but not limited to vaginal bleeding, contractions, leaking of fluid and fetal movement were reviewed in detail with the patient.  I discussed the assessment and treatment plan with the patient. The patient was provided an opportunity to ask questions and all were answered. The patient agreed with the plan and demonstrated an understanding of the instructions. The patient was advised to call back or seek an in-person office evaluation/go to MAU at [facility] for any urgent or concerning symptoms. Please refer to After Visit Summary for other counseling recommendations.   I provided 8 minutes of non-face-to-face time during this encounter.  No follow-ups on file.  Future Appointments  Date Time Provider Department Center  12/25/2018 11:15 AM Allie Bossier, MD [facility]  12/26/2018  6:30 AM [facility] SCHED ROOM [facility] None    Allie Bossier, MD Center for [employer], [facility]

## 2018-12-26 ENCOUNTER — Inpatient Hospital Stay (HOSPITAL_COMMUNITY)
Admission: AD | Admit: 2018-12-26 | Discharge: 2018-12-28 | DRG: 807 | Disposition: A | Discharge status: Home / Self Care | Payer: Medicaid Other | Attending: Obstetrics and Gynecology | Admitting: Obstetrics and Gynecology

## 2018-12-26 ENCOUNTER — Inpatient Hospital Stay (HOSPITAL_COMMUNITY): Payer: Medicaid Other | Admitting: Anesthesiology

## 2018-12-26 ENCOUNTER — Other Ambulatory Visit: Payer: Self-pay

## 2018-12-26 ENCOUNTER — Inpatient Hospital Stay (HOSPITAL_COMMUNITY): Payer: Medicaid Other

## 2018-12-26 DIAGNOSIS — Z789 Other specified health status: Secondary | ICD-10-CM | POA: Diagnosis present

## 2018-12-26 DIAGNOSIS — O99214 Obesity complicating childbirth: Principal | ICD-10-CM | POA: Diagnosis present

## 2018-12-26 DIAGNOSIS — F329 Major depressive disorder, single episode, unspecified: Secondary | ICD-10-CM

## 2018-12-26 DIAGNOSIS — E669 Obesity, unspecified: Secondary | ICD-10-CM | POA: Diagnosis present

## 2018-12-26 DIAGNOSIS — F4310 Post-traumatic stress disorder, unspecified: Secondary | ICD-10-CM | POA: Diagnosis present

## 2018-12-26 DIAGNOSIS — O9902 Anemia complicating childbirth: Secondary | ICD-10-CM | POA: Diagnosis present

## 2018-12-26 DIAGNOSIS — Z3A40 40 weeks gestation of pregnancy: Secondary | ICD-10-CM | POA: Diagnosis not present

## 2018-12-26 DIAGNOSIS — T50912A Poisoning by multiple unspecified drugs, medicaments and biological substances, intentional self-harm, initial encounter: Secondary | ICD-10-CM | POA: Diagnosis present

## 2018-12-26 DIAGNOSIS — D649 Anemia, unspecified: Secondary | ICD-10-CM | POA: Diagnosis present

## 2018-12-26 DIAGNOSIS — O99210 Obesity complicating pregnancy, unspecified trimester: Secondary | ICD-10-CM | POA: Diagnosis present

## 2018-12-26 DIAGNOSIS — F32A Depression, unspecified: Secondary | ICD-10-CM

## 2018-12-26 DIAGNOSIS — O0990 Supervision of high risk pregnancy, unspecified, unspecified trimester: Secondary | ICD-10-CM

## 2018-12-26 DIAGNOSIS — R45851 Suicidal ideations: Secondary | ICD-10-CM

## 2018-12-26 DIAGNOSIS — Z758 Other problems related to medical facilities and other health care: Secondary | ICD-10-CM | POA: Diagnosis present

## 2018-12-26 DIAGNOSIS — G8928 Other chronic postprocedural pain: Secondary | ICD-10-CM

## 2018-12-26 DIAGNOSIS — O09529 Supervision of elderly multigravida, unspecified trimester: Secondary | ICD-10-CM

## 2018-12-26 DIAGNOSIS — O09522 Supervision of elderly multigravida, second trimester: Secondary | ICD-10-CM

## 2018-12-26 DIAGNOSIS — O09523 Supervision of elderly multigravida, third trimester: Secondary | ICD-10-CM | POA: Diagnosis present

## 2018-12-26 HISTORY — DX: Supervision of elderly multigravida, unspecified trimester: O09.529

## 2018-12-26 LAB — CBC
HCT: 29.2 % — ABNORMAL LOW (ref 36.0–46.0)
Hemoglobin: 9.4 g/dL — ABNORMAL LOW (ref 12.0–15.0)
MCH: 28.5 pg (ref 26.0–34.0)
MCHC: 32.2 g/dL (ref 30.0–36.0)
MCV: 88.5 fL (ref 80.0–100.0)
Platelets: 255 10*3/uL (ref 150–400)
RBC: 3.3 MIL/uL — ABNORMAL LOW (ref 3.87–5.11)
RDW: 13.9 % (ref 11.5–15.5)
WBC: 7.1 10*3/uL (ref 4.0–10.5)
nRBC: 0 % (ref 0.0–0.2)

## 2018-12-26 LAB — TYPE AND SCREEN
ABO/RH(D): O POS
Antibody Screen: NEGATIVE

## 2018-12-26 MED ORDER — OXYTOCIN 40 UNITS IN NORMAL SALINE INFUSION - SIMPLE MED
1.0000 m[IU]/min | INTRAVENOUS | Status: DC
  Administered 2018-12-26: 2 m[IU]/min via INTRAVENOUS

## 2018-12-26 MED ORDER — PHENYLEPHRINE 40 MCG/ML (10ML) SYRINGE FOR IV PUSH (FOR BLOOD PRESSURE SUPPORT): 80.0000 ug | PREFILLED_SYRINGE | INTRAVENOUS | Status: DC | PRN

## 2018-12-26 MED ORDER — DIPHENHYDRAMINE HCL 50 MG/ML IJ SOLN: 12.5000 mg | INTRAMUSCULAR | Status: DC | PRN

## 2018-12-26 MED ORDER — LACTATED RINGERS IV SOLN
500.0000 mL | Freq: Once | INTRAVENOUS | Status: AC
  Administered 2018-12-26: 500 mL via INTRAVENOUS

## 2018-12-26 MED ORDER — LIDOCAINE HCL (PF) 1 % IJ SOLN
INTRAMUSCULAR | Status: DC | PRN
  Administered 2018-12-26: 5 mL via EPIDURAL
  Administered 2018-12-26: 5 mL

## 2018-12-26 MED ORDER — EPHEDRINE 5 MG/ML INJ: 10.0000 mg | INTRAVENOUS | Status: DC | PRN

## 2018-12-26 MED ORDER — MISOPROSTOL 25 MCG QUARTER TABLET
ORAL_TABLET | ORAL | Status: AC
  Filled 2018-12-26: qty 1

## 2018-12-26 MED ORDER — LACTATED RINGERS IV SOLN
500.0000 mL | INTRAVENOUS | Status: DC | PRN
  Administered 2018-12-27: 500 mL via INTRAVENOUS

## 2018-12-26 MED ORDER — TERBUTALINE SULFATE 1 MG/ML IJ SOLN: 0.2500 mg | Freq: Once | INTRAMUSCULAR | Status: DC | PRN

## 2018-12-26 MED ORDER — LACTATED RINGERS IV SOLN
INTRAVENOUS | Status: DC
  Administered 2018-12-26 (×2): via INTRAVENOUS
  Administered 2018-12-26: 125 mL/h via INTRAVENOUS
  Administered 2018-12-27: 12:00:00 via INTRAVENOUS

## 2018-12-26 MED ORDER — FENTANYL-BUPIVACAINE-NACL 0.5-0.125-0.9 MG/250ML-% EP SOLN
EPIDURAL | Status: AC
  Administered 2018-12-27: 12 mL/h via EPIDURAL
  Filled 2018-12-26: qty 250

## 2018-12-26 MED ORDER — FENTANYL CITRATE (PF) 100 MCG/2ML IJ SOLN
100.0000 ug | INTRAMUSCULAR | Status: DC | PRN
  Administered 2018-12-26: 100 ug via INTRAVENOUS
  Filled 2018-12-26: qty 2

## 2018-12-26 MED ORDER — MISOPROSTOL 50MCG HALF TABLET
50.0000 ug | ORAL_TABLET | ORAL | Status: DC
  Administered 2018-12-26: 50 ug via BUCCAL

## 2018-12-26 MED ORDER — PHENYLEPHRINE 40 MCG/ML (10ML) SYRINGE FOR IV PUSH (FOR BLOOD PRESSURE SUPPORT)
PREFILLED_SYRINGE | INTRAVENOUS | Status: AC
  Filled 2018-12-26: qty 10

## 2018-12-26 MED ORDER — FENTANYL-BUPIVACAINE-NACL 0.5-0.125-0.9 MG/250ML-% EP SOLN
12.0000 mL/h | EPIDURAL | Status: DC | PRN
  Administered 2018-12-27: 12 mL/h via EPIDURAL
  Filled 2018-12-26: qty 250

## 2018-12-26 MED ORDER — SOD CITRATE-CITRIC ACID 500-334 MG/5ML PO SOLN: 30.0000 mL | ORAL | Status: DC | PRN

## 2018-12-26 MED ORDER — MISOPROSTOL 25 MCG QUARTER TABLET
25.0000 ug | ORAL_TABLET | ORAL | Status: DC
  Administered 2018-12-26: 25 ug via VAGINAL

## 2018-12-26 MED ORDER — ONDANSETRON HCL 4 MG/2ML IJ SOLN
4.0000 mg | Freq: Four times a day (QID) | INTRAMUSCULAR | Status: DC | PRN
  Administered 2018-12-26: 4 mg via INTRAVENOUS
  Filled 2018-12-26: qty 2

## 2018-12-26 MED ORDER — MISOPROSTOL 50MCG HALF TABLET
ORAL_TABLET | ORAL | Status: AC
  Filled 2018-12-26: qty 1

## 2018-12-26 MED ORDER — SODIUM CHLORIDE (PF) 0.9 % IJ SOLN
INTRAMUSCULAR | Status: DC | PRN
  Administered 2018-12-26: 12 mL/h via EPIDURAL

## 2018-12-26 MED ORDER — LIDOCAINE HCL (PF) 1 % IJ SOLN: 30.0000 mL | INTRAMUSCULAR | Status: DC | PRN

## 2018-12-26 MED ORDER — OXYTOCIN BOLUS FROM INFUSION
500.0000 mL | Freq: Once | INTRAVENOUS | Status: AC
  Administered 2018-12-27: 500 mL via INTRAVENOUS

## 2018-12-26 MED ORDER — OXYTOCIN 40 UNITS IN NORMAL SALINE INFUSION - SIMPLE MED
2.5000 [IU]/h | INTRAVENOUS | Status: DC
  Filled 2018-12-26: qty 1000

## 2018-12-26 NOTE — Anesthesia Preprocedure Evaluation (Addendum)
Anesthesia Evaluation  Patient identified by MRN, date of birth, ID band Patient awake    Reviewed: Allergy & Precautions, NPO status , Patient's Chart, lab work & pertinent test results  History of Anesthesia Complications Negative for: history of anesthetic complications  Airway Mallampati: II  TM Distance: >3 FB Neck ROM: Full    Dental   Pulmonary asthma ,    breath sounds clear to auscultation       Cardiovascular negative cardio ROS   Rhythm:Regular Rate:Normal     Neuro/Psych  Headaches, Anxiety Depression S/p lumbar laminectomy    GI/Hepatic Neg liver ROS, GERD  Poorly Controlled,  Endo/Other  obesity  Renal/GU negative Renal ROS     Musculoskeletal   Abdominal (+) + obese,   Peds  Hematology  (+) anemia , Hb 9.4, plt 255k   Anesthesia Other Findings   Reproductive/Obstetrics (+) Pregnancy                            Anesthesia Physical Anesthesia Plan  ASA: II  Anesthesia Plan: Epidural   Post-op Pain Management:    Induction:   PONV Risk Score and Plan: 2 and Treatment may vary due to age or medical condition  Airway Management Planned: Natural Airway  Additional Equipment:   Intra-op Plan:   Post-operative Plan:   Informed Consent: I have reviewed the patients History and Physical, chart, labs and discussed the procedure including the risks, benefits and alternatives for the proposed anesthesia with the patient or authorized representative who has indicated his/her understanding and acceptance.       Plan Discussed with:   Anesthesia Plan Comments: (Patient identified. Risks/Benefits/Options discussed with patient, through translator, including but not limited to bleeding, infection, nerve damage, paralysis, failed block, incomplete pain control, headache, blood pressure changes, nausea, vomiting, reactions to medication both or allergic, itching and postpartum  back pain. Confirmed with bedside nurse the patient's most recent platelet count. Confirmed with patient that they are not currently taking any anticoagulation, have any bleeding history or any family history of bleeding disorders. Patient expressed understanding and wished to proceed. All questions were answered. )       Anesthesia Quick Evaluation

## 2018-12-26 NOTE — Anesthesia Procedure Notes (Signed)
Epidural Patient location during procedure: OB Start time: 12/26/2018 9:30 PM End time: 12/26/2018 9:58 PM  Staffing Anesthesiologist: Jairo Ben, MD Performed: anesthesiologist   Preanesthetic Checklist Completed: patient identified, surgical consent, pre-op evaluation, timeout performed, IV checked, risks and benefits discussed and monitors and equipment checked  Epidural Patient position: sitting Prep: site prepped and draped and DuraPrep Patient monitoring: blood pressure, continuous pulse ox and heart rate Approach: midline Location: L3-L4 Injection technique: LOR air  Needle:  Needle type: Tuohy  Needle gauge: 17 G Needle length: 9 cm Needle insertion depth: 6 cm Catheter type: closed end flexible Catheter size: 19 Gauge Catheter at skin depth: 11 cm Test dose: negative (1% lidocaine)  Assessment Events: blood not aspirated, injection not painful, no injection resistance, negative IV test and no paresthesia  Additional Notes Pt identified in Labor room.  Monitors applied. Working IV access confirmed. Sterile prep, drape lumbar spine.  1% lido local L 3,4.  #17ga Touhy LOR air at 6 cm L 3,4, cath in easily to 11 cm skin. Test dose OK, cath dosed and infusion begun.  Patient asymptomatic, VSS, no heme aspirated, tolerated well.  Sandford Craze, MDReason for block:procedure for pain

## 2018-12-26 NOTE — Progress Notes (Signed)
[name] is a 38 y.o. [id number] at [redacted]w[redacted]d admitted for induction of labor due to Spontaneous rupture of Class III obesity and AMA.  Subjective: Reporting painful abdominal contractions which she rates as 9/10. Declines pain medicine at this time.  Objective: BP 125/86   Pulse 78   Resp 18   Ht 5' (1.524 m)   Wt 102.1 kg   LMP 03/03/2018   BMI 43.94 kg/m  No intake/output data recorded. No intake/output data recorded.  FHT:  FHR: 140 bpm, variability: moderate,  accelerations:  Present,  decelerations:  Absent UC:   irregular, every 2-6 minutes SVE:   Dilation: 4 Effacement (%): 60 Station: Ballotable Exam by:: [employer]  Labs: Lab Results  Component Value Date   WBC 7.1 12/26/2018   HGB 9.4 (L) 12/26/2018   HCT 29.2 (L) 12/26/2018   MCV 88.5 12/26/2018   PLT 255 12/26/2018    Assessment / Plan: 38 y.o. [id number] at [redacted]w[redacted]d  Category I tracing S/p foley bulb, Cytotec at 1543 Irregular strong contractions Cervix remains long and posterior, fetus not well engaged Evaluate for change without further augmentation given current contraction pattern and intensity May have epidural upon request Anticipated MOD:  NSVD  Calvert Cantor, CNM 12/26/2018, 5:54 PM

## 2018-12-26 NOTE — Progress Notes (Signed)
Spanish interpreter present during admission.

## 2018-12-26 NOTE — Progress Notes (Signed)
LABOR PROGRESS NOTE  [name] is a 38 y.o. [id number] at [redacted]w[redacted]d admitted for IOL for AMA  Subjective: Epidural place Feeling comfortable  Objective: BP 98/61   Pulse 77   Temp 98.2 F (36.8 C) (Oral)   Resp 16   Ht 5' (1.524 m)   Wt 102.1 kg   LMP 03/03/2018   SpO2 100%   BMI 43.94 kg/m  or  Vitals:   12/26/18 2225 12/26/18 2230 12/26/18 2235 12/26/18 2245  BP: 92/61 (!) 106/91 108/69 98/61  Pulse: (!) 102  71 77  Resp: 16 18 18 16   Temp:    98.2 F (36.8 C)  TempSrc:    Oral  SpO2: 99% 99% 100%   Weight:      Height:        Dilation: 4 Effacement (%): 60 Station: Ballotable Presentation: Vertex Exam by:: Thalia Bloodgood FHT: baseline rate 120s, moderate varibility, + acel, no decel Toco: difficult to trace  Labs: Lab Results  Component Value Date   WBC 7.1 12/26/2018   HGB 9.4 (L) 12/26/2018   HCT 29.2 (L) 12/26/2018   MCV 88.5 12/26/2018   PLT 255 12/26/2018    Patient Active Problem List   Diagnosis Date Noted  . AMA (advanced maternal age) multigravida 35+ 12/26/2018  . History of polyhydramnios 11/27/2018  . Language barrier 11/21/2018  . Obesity in pregnancy 11/07/2018  . Supervision of high risk pregnancy, antepartum 10/20/2018  . Multigravida of advanced maternal age in third trimester 10/20/2018  . ASCUS of cervix with negative high risk HPV 10/20/2018  . PTSD (post-traumatic stress disorder) 05/29/2013  . Depression with suicidal ideation 05/29/2013  . Suicide attempt by multiple drug overdose 05/29/2013  . Chronic pain following surgery or procedure 05/29/2013  . HNP (herniated nucleus pulposus), lumbar 02/04/2013    Assessment / Plan: 38 y.o. [id number] at 38 y.o here for IOL for AMA  Labor: start pitocin. Consider AROM with next check. Fetal Wellbeing:  Cat 1 Pain Control:  Epidural in place Anticipated MOD:  SVD  Runette Scifres,MD OB Fellow  12/26/2018, 11:44 PM

## 2018-12-26 NOTE — H&P (Signed)
[name] is a 38 y.o. female presenting for induction of labor for Class II obesity. She received prenatal care at [facility] followed by [facility]. Her dating is based on LMP c/w early ultrasound.    OB History    Gravida  8   Para  4   Term  4   Preterm      AB  3   Living  4     SAB  1   TAB  2   Ectopic      Multiple      Live Births  4          Past Medical History:  Diagnosis Date  . Anxiety    relative to circumstances to back problems/injury, not taking any medicine, pt. reports has been crying a lot  . Arthritis    low back  . Asthma   . Depression   . GERD (gastroesophageal reflux disease)   . Migraines   . Neck pain    Past Surgical History:  Procedure Laterality Date  . childbirth     x4 vaginal   . LUMBAR LAMINECTOMY/DECOMPRESSION MICRODISCECTOMY  06/05/2012   Procedure: LUMBAR LAMINECTOMY/DECOMPRESSION MICRODISCECTOMY;  Surgeon: Emilee Hero, MD;  Location: [facility] OR;  Service: Orthopedics;  Laterality: Left;  Left sided lumbar 4-5 microdisectomy   Family History: family history includes Asthma in her brother. Social History:  reports that she has never smoked. She has never used smokeless tobacco. She reports that she does not drink alcohol or use drugs.     Maternal Diabetes: No Genetic Screening: Normal Maternal Ultrasounds/Referrals: Normal Fetal Ultrasounds or other Referrals:  None Maternal Substance Abuse:  Hx suicide attempt with overdose, no current use Significant Maternal Medications:  None Significant Maternal Lab Results:  None Other Comments:  GBS NEGATIVE  Review of Systems  Constitutional: Negative for chills and fever.  Respiratory: Negative for shortness of breath.   Cardiovascular: Negative for chest pain.  Gastrointestinal: Positive for abdominal pain. Negative for vomiting.       Patient reports occasional lower abdominal contractions 7/10  Psychiatric/Behavioral:  Negative for depression, substance abuse and suicidal ideas. The patient is nervous/anxious.   All other systems reviewed and are negative.  Maternal Medical History:  Reason for admission: Induction of labor  Fetal activity: Perceived fetal activity is normal.   Last perceived fetal movement was within the past hour.    Prenatal complications: no prenatal complications Prenatal Complications - Diabetes: none.      Last menstrual period 03/03/2018. Maternal Exam:  Uterine Assessment: Contraction frequency is irregular.   Abdomen: Patient reports no abdominal tenderness. Fetal presentation: vertex  Introitus: Normal vulva. Normal vagina.  Vagina is negative for discharge.  Ferning test: not done.  Nitrazine test: not done. Amniotic fluid character: not assessed.  Cervix: Cervix evaluated by digital exam.     Physical Exam  Nursing note and vitals reviewed. Constitutional: She is oriented to person, place, and time. She appears well-developed and well-nourished.  Cardiovascular: Normal rate, normal heart sounds and intact distal pulses.  Respiratory: Effort normal and breath sounds normal. No respiratory distress.  GI: Soft. She exhibits no distension. There is no abdominal tenderness. There is no rebound and no guarding.  Genitourinary:    Vulva, vagina and uterus normal.     No vaginal discharge.   Neurological: [name] and oriented to person, place, and time.  Skin: Skin is warm and dry.  Psychiatric: She has  a normal mood and affect. Her behavior is normal. Judgment and thought content normal.    Prenatal labs: ABO, Rh: O/Positive/-- (01/21 0000) Antibody: Negative (01/21 0000) Rubella: Immune (01/21 0000) RPR: Nonreactive (01/21 0000)  HBsAg: Negative (01/21 0000)  HIV: Non-reactive (01/21 0000)  GBS:   NEGATIVE  Fetal Well-Being: --Reactive tracing: baseline 135, moderate variability, positive accelerations, no decelerations --Toco: irregular ctx q 2-5  min, palpate mild, not consistently felt by patient --EFW 72% at 32w 5d --Vertex confirmed by bedside ultrasound  Assessment/Plan: --38 y.o. [id number] at 3158w0d  --Category I tracing --Place 50 mcg Cytotec buccal now, reevaluate for foley bulb placement in 4 hours --Discussed FB + Cytotec and bridge to Pitocin as needed  --boy (does not desire circ)/breast/Depo --Desires epidural for pain management in active labor --Language barrier: Contract Spanish interpreter present for all patient interaction   Plan for postpartum social work consult  Calvert CantorSamantha C Weinhold, CNM 12/26/2018, 12:01 PM

## 2018-12-26 NOTE — Progress Notes (Signed)
[name] is a 38 y.o. [id number] at [redacted]w[redacted]d admitted for IOL for Class III obesity and AMA  Subjective: Reports irregular mild contractions. Coping well using distraction  Objective: BP 109/63   Pulse (!) 105   Resp 16   Ht 5' (1.524 m)   Wt 102.1 kg   LMP 03/03/2018   BMI 43.94 kg/m  No intake/output data recorded. No intake/output data recorded.  FHT:  FHR: 145 bpm, variability: moderate,  accelerations:  Present,  decelerations:  Absent UC:   Rare, UI SVE:   Dilation: 1.5 Effacement (%): Thick Station: Ballotable Exam by:: [employer]  Labs: Lab Results  Component Value Date   WBC 7.1 12/26/2018   HGB 9.4 (L) 12/26/2018   HCT 29.2 (L) 12/26/2018   MCV 88.5 12/26/2018   PLT 255 12/26/2018    Assessment / Plan: --Category I tracing --S/p 50 mcg buccal Cytotec at 1142 --Foley bulb + 25 mcg vaginal Cytotec placed now. Tolerated very well by patient --Consider Pitocin titration when FB dislodged --Interpreter Viria at bedside --Anticipate NSVD   Calvert Cantor, CNM 12/26/2018, 3:56 PM

## 2018-12-27 ENCOUNTER — Other Ambulatory Visit: Payer: Self-pay

## 2018-12-27 ENCOUNTER — Encounter (HOSPITAL_COMMUNITY): Payer: Self-pay

## 2018-12-27 DIAGNOSIS — O99214 Obesity complicating childbirth: Secondary | ICD-10-CM

## 2018-12-27 DIAGNOSIS — Z3A40 40 weeks gestation of pregnancy: Secondary | ICD-10-CM

## 2018-12-27 LAB — RPR: RPR Ser Ql: NONREACTIVE

## 2018-12-27 MED ORDER — DIPHENHYDRAMINE HCL 25 MG PO CAPS: 25.0000 mg | ORAL_CAPSULE | Freq: Four times a day (QID) | ORAL | Status: DC | PRN

## 2018-12-27 MED ORDER — ACETAMINOPHEN 325 MG PO TABS
650.0000 mg | ORAL_TABLET | ORAL | Status: DC | PRN
  Administered 2018-12-27 – 2018-12-28 (×3): 650 mg via ORAL
  Filled 2018-12-27 (×3): qty 2

## 2018-12-27 MED ORDER — IBUPROFEN 600 MG PO TABS
600.0000 mg | ORAL_TABLET | Freq: Four times a day (QID) | ORAL | Status: DC
  Administered 2018-12-27 – 2018-12-28 (×4): 600 mg via ORAL
  Filled 2018-12-27 (×4): qty 1

## 2018-12-27 MED ORDER — DIBUCAINE (PERIANAL) 1 % EX OINT: 1.0000 "application " | TOPICAL_OINTMENT | CUTANEOUS | Status: DC | PRN

## 2018-12-27 MED ORDER — WITCH HAZEL-GLYCERIN EX PADS: 1.0000 "application " | MEDICATED_PAD | CUTANEOUS | Status: DC | PRN

## 2018-12-27 MED ORDER — ONDANSETRON HCL 4 MG/2ML IJ SOLN: 4.0000 mg | INTRAMUSCULAR | Status: DC | PRN

## 2018-12-27 MED ORDER — MISOPROSTOL 200 MCG PO TABS
600.0000 ug | ORAL_TABLET | Freq: Once | ORAL | Status: AC
  Administered 2018-12-27: 600 ug via RECTAL

## 2018-12-27 MED ORDER — MISOPROSTOL 200 MCG PO TABS
ORAL_TABLET | ORAL | Status: AC
  Filled 2018-12-27: qty 3

## 2018-12-27 MED ORDER — PRENATAL MULTIVITAMIN CH
1.0000 | ORAL_TABLET | Freq: Every day | ORAL | Status: DC
  Administered 2018-12-28: 1 via ORAL
  Filled 2018-12-27: qty 1

## 2018-12-27 MED ORDER — SENNOSIDES-DOCUSATE SODIUM 8.6-50 MG PO TABS
2.0000 | ORAL_TABLET | ORAL | Status: DC
  Filled 2018-12-27: qty 2

## 2018-12-27 MED ORDER — COCONUT OIL OIL: 1.0000 "application " | TOPICAL_OIL | Status: DC | PRN

## 2018-12-27 MED ORDER — TETANUS-DIPHTH-ACELL PERTUSSIS 5-2.5-18.5 LF-MCG/0.5 IM SUSP: 0.5000 mL | Freq: Once | INTRAMUSCULAR | Status: DC

## 2018-12-27 MED ORDER — ZOLPIDEM TARTRATE 5 MG PO TABS: 5.0000 mg | ORAL_TABLET | Freq: Every evening | ORAL | Status: DC | PRN

## 2018-12-27 MED ORDER — ONDANSETRON HCL 4 MG PO TABS: 4.0000 mg | ORAL_TABLET | ORAL | Status: DC | PRN

## 2018-12-27 MED ORDER — SIMETHICONE 80 MG PO CHEW: 80.0000 mg | CHEWABLE_TABLET | ORAL | Status: DC | PRN

## 2018-12-27 MED ORDER — BENZOCAINE-MENTHOL 20-0.5 % EX AERO: 1.0000 "application " | INHALATION_SPRAY | CUTANEOUS | Status: DC | PRN

## 2018-12-27 NOTE — Lactation Note (Signed)
This note was copied from a baby's chart. Lactation Consultation Note  Patient Name: [name] [id number]'M Date: 12/27/2018 Reason for consult: Initial assessment;Term P5, 5 hour female infant. Per mom, infant latch well in L & D. Infant already BF 3 times since birth and BF less than 30 minutes prior to [facility] In house  Interpreter : Raquel entered the room LC did not observe a latch at this time. Mom feels breastfeed is going well she has no concerns at this time. Mom is experienced at breastfeeding she breastfeed her other 4 children for one year each. She doesn't know how to hand express so LC help mom to hand express and mom taught back and easily expressed 4 ml of colostrum  that was given to infant by spoon. Mom has everted nipples with not trauma noted. Mom is active on the [facility] program in [place]. Mom doesn't have a hand pump, LC gave a hand pump and explained how to use, clean, assemble, re-assemble and store pump. Mom knows to breastfeed according to hunger cues, 8 or more times within 24 hours. LC discussed I & O. Mom knows to call Nurse or LC if she has any questions, concerns or need assistance with latching infant to breast.  Mom made aware of O/P services, breastfeeding support groups, community resources, and our phone # for post-discharge questions.  Maternal Data Formula Feeding for Exclusion: No Has patient been taught Hand Expression?: Yes Does the patient have breastfeeding experience prior to this delivery?: Yes  Feeding Feeding Type: Breast Fed  LATCH Score                   Interventions Interventions: Breast feeding basics reviewed;Skin to skin;Hand express;Position options;Hand pump  Lactation Tools Discussed/Used WIC Program: Yes Pump Review: Setup, frequency, and cleaning;Milk Storage Initiated by:: Danelle Earthly, IBCLC   Consult Status Consult Status: Follow-up Date: 12/28/18 Follow-up type: In-patient    Danelle Earthly 12/27/2018, 9:53 PM

## 2018-12-27 NOTE — Progress Notes (Signed)
Vitals:   12/27/18 0931 12/27/18 1001  BP: 112/68 (!) 112/93  Pulse: 87   Resp: 20 (!) 22  Temp:    SpO2:     cx 9/100/-2.  IUPC placed.  FHR Cat 1-2 (some mild variables).

## 2018-12-27 NOTE — Discharge Summary (Signed)
Postpartum Discharge Summary  Patient Name: Patricia StakesOsbelia Brandt DOB: 1980/09/17 MRN: [id number]  Date of admission: 12/26/2018 Delivering Provider: Jacklyn ShellRESENZO-DISHMON, FRANCES   Date of discharge: 12/28/2018  Admitting diagnosis: pregnancy Intrauterine pregnancy: 385w1d     Secondary diagnosis:  Active Problems:   PTSD (post-traumatic stress disorder)   Depression with suicidal ideation   Suicide attempt by multiple drug overdose   Multigravida of advanced maternal age in third trimester   Obesity in pregnancy   Language barrier   AMA (advanced maternal age) multigravida 35+   SVD (spontaneous vaginal delivery)  Discharge diagnosis: Term Pregnancy Delivered          Postpartum procedures:Depo to be given prior to discharge Augmentation: AROM, Pitocin, Cytotec and Foley Balloon Complications: None  Hospital course:  Induction of Labor With Vaginal Delivery   38 y.o. yo [id number] at 8585w1d was admitted to the hospital 12/26/2018 for induction of labor.  Indication for induction: AMA.  Patient had an uncomplicated labor course as follows: cytotec/foley/pitocin Membrane Rupture Time/Date: 6:43 AM ,12/27/2018   Intrapartum Procedures: Episiotomy: None [1]                                         Lacerations:  None [1]  Patient had delivery of a Viable infant. Pushed about 30 minutes, baby was asynclitic, suspect reason for prolonged transitional stage/pushing for multip.   Information for the patient's newborn:  Virl CageyCasas, Boy Niajah [[id number]][[id number]]  Delivery Method: Vaginal, Spontaneous(Filed from Delivery Summary)   12/27/2018  Details of delivery can be found in separate delivery note.  Patient had a routine postpartum course. She had a social work consult given history of depression with suicide attempt. Patient is discharged home 12/28/18.  Magnesium Sulfate recieved: No BMZ received: No  Physical exam  Vitals:   12/27/18 1636 12/27/18 2133 12/28/18 0145 12/28/18 0508  BP: 109/65 117/73 112/70  116/75  Pulse: 77 74 71 71  Resp: 19 20    Temp: 98.6 F (37 C) 98.3 F (36.8 C) 97.8 F (36.6 C) 97.8 F (36.6 C)  TempSrc:  Oral  Oral  SpO2: 97%  98% 97%  Weight:      Height:       General: alert, well-appearing, NAD Lochia: appropriate Uterine Fundus: firm Incision: N/A DVT Evaluation: No significant calf/ankle edema  Labs: Lab Results  Component Value Date   WBC 13.1 (H) 12/28/2018   HGB 8.9 (L) 12/28/2018   HCT 27.9 (L) 12/28/2018   MCV 89.4 12/28/2018   PLT 219 12/28/2018   CMP Latest Ref Rng & Units 10/03/2018  Glucose 70 - 99 mg/dL 130(Q114(H)  BUN 6 - 20 mg/dL 5(L)  Creatinine 6.570.44 - 1.00 mg/dL 8.460.50  Sodium 962135 - 952145 mmol/L 133(L)  Potassium 3.5 - 5.1 mmol/L 3.5  Chloride 98 - 111 mmol/L 108  CO2 22 - 32 mmol/L 18(L)  Calcium 8.9 - 10.3 mg/dL 8.0(L)  Total Protein 6.5 - 8.1 g/dL 6.8  Total Bilirubin 0.3 - 1.2 mg/dL 0.3  Alkaline Phos 38 - 126 U/L 90  AST 15 - 41 U/L 26  ALT 0 - 44 U/L 23    Discharge instruction: per After Visit Summary and "Baby and Me Booklet".  After visit meds:  Allergies as of 12/28/2018      Reactions   Hydrocodone-acetaminophen Itching   Has some itching on face & nose but not bad enough to stop  taking it/ MD aware and instructed patient to take anti-allergy medication prn.      Medication List    STOP taking these medications   aspirin EC 81 MG tablet     TAKE these medications   acetaminophen 500 MG tablet Commonly known as:  TYLENOL Take 500 mg by mouth every 6 (six) hours as needed.   calcium carbonate 500 MG chewable tablet Commonly known as:  TUMS - dosed in mg elemental calcium Chew 2-3 tablets by mouth as needed for indigestion or heartburn.   ibuprofen 800 MG tablet Commonly known as:  ADVIL Take 1 tablet (800 mg total) by mouth 3 (three) times daily.   loratadine 10 MG tablet Commonly known as:  CLARITIN Take 10 mg by mouth daily.   multivitamin-prenatal 27-0.8 MG Tabs tablet Take 1 tablet by mouth  daily at 12 noon.      Diet: routine diet Activity: Advance as tolerated. Pelvic rest for 6 weeks.   Outpatient follow up:4 weeks Follow up Appt:No future appointments. Follow up Visit: Follow-up Information    [facility] for [facility]. Schedule an appointment as soon as possible for a visit.   Specialty:  Obstetrics and Gynecology Why:  You should receive a call to schedule a postpartum follow-up visit. If you do not, please call clinic to make an appointment in 4-6 weeks.  Contact information: [address], [address] [id number] mc [place] [address] [phone]        Please schedule this patient for Postpartum visit in: 4 weeks with the following provider: Any provider For C/S patients schedule nurse incision check in weeks 2 weeks:  High risk pregnancy complicated by: AMA Delivery mode:  SVD Anticipated Birth Control:  Depo PP Procedures needed:   Schedule Integrated BH visit: yes (hx suicide attempt)  Newborn Data: Live born female  Birth Weight:  3980g APGAR: 9, 9  Newborn Delivery   Birth date/time:  12/27/2018 13:34:00 Delivery type:  Vaginal, Spontaneous    Baby Feeding: Both Disposition:home with mother  12/28/2018 Tamera Stands, DO

## 2018-12-27 NOTE — Progress Notes (Signed)
LABOR PROGRESS NOTE  Loreatha Bacco is a 38 y.o. [id number] at [redacted]w[redacted]d  admitted for IOL for AMA  Subjective: Feeling more uncomfortable with contractions Pressure in bottom  Objective: BP 97/64   Pulse 90   Temp 98.4 F (36.9 C) (Oral)   Resp 16   Ht 5' (1.524 m)   Wt 102.1 kg   LMP 03/03/2018   SpO2 100%   BMI 43.94 kg/m  or  Vitals:   12/27/18 0600 12/27/18 0700 12/27/18 0705 12/27/18 0731  BP: 112/76  114/73 97/64  Pulse: 81  83 90  Resp: 18  16 16   Temp:  98.4 F (36.9 C)    TempSrc:  Oral    SpO2:      Weight:      Height:        Dilation: 8 Effacement (%): 90 Cervical Position: Middle Station: -2 Presentation: Vertex Exam by:: Adelina Mings, RN FHT: baseline rate 140, moderate varibility, + acel, early decel Toco: q2-20m  Labs: Lab Results  Component Value Date   WBC 7.1 12/26/2018   HGB 9.4 (L) 12/26/2018   HCT 29.2 (L) 12/26/2018   MCV 88.5 12/26/2018   PLT 255 12/26/2018    Patient Active Problem List   Diagnosis Date Noted  . AMA (advanced maternal age) multigravida 35+ 12/26/2018  . History of polyhydramnios 11/27/2018  . Language barrier 11/21/2018  . Obesity in pregnancy 11/07/2018  . Supervision of high risk pregnancy, antepartum 10/20/2018  . Multigravida of advanced maternal age in third trimester 10/20/2018  . ASCUS of cervix with negative high risk HPV 10/20/2018  . PTSD (post-traumatic stress disorder) 05/29/2013  . Depression with suicidal ideation 05/29/2013  . Suicide attempt by multiple drug overdose 05/29/2013  . Chronic pain following surgery or procedure 05/29/2013  . HNP (herniated nucleus pulposus), lumbar 02/04/2013    Assessment / Plan: 38 y.o. [id number] at [redacted]w[redacted]d here for IOL for AMA  Labor: improved fetal position. AROM at [phone]. Continue pitocin.  Fetal Wellbeing:  Cat 2 (reassuring) Pain Control:  Epidural in place Anticipated MOD:  SVD  Itati Brocksmith,MD OB Fellow  12/27/2018, 8:10 AM

## 2018-12-27 NOTE — Anesthesia Postprocedure Evaluation (Signed)
Anesthesia Post Note  Patient: [name]  Procedure(s) Performed: AN AD HOC LABOR EPIDURAL     Patient location during evaluation: Mother Baby Anesthesia Type: Epidural Level of consciousness: awake and alert, oriented and patient cooperative Pain management: pain level controlled Vital Signs Assessment: post-procedure vital signs reviewed and stable Respiratory status: spontaneous breathing Cardiovascular status: stable Postop Assessment: no headache, epidural receding, patient able to bend at knees and no signs of nausea or vomiting Anesthetic complications: no Comments: Pt interviewed via phone interviewed d/t COVID-19.  Pt states she is walking.  States pain score is a 7 but is manageable.      Last Vitals:  Vitals:   12/27/18 1530 12/27/18 1636  BP: 118/69 109/65  Pulse: 65 77  Resp: 18 19  Temp: 37.4 C 37 C  SpO2:  97%    Last Pain:  Vitals:   12/27/18 1648  TempSrc:   PainSc: 0-No pain   Pain Goal:                   [facility]

## 2018-12-28 LAB — CBC
HCT: 27.9 % — ABNORMAL LOW (ref 36.0–46.0)
Hemoglobin: 8.9 g/dL — ABNORMAL LOW (ref 12.0–15.0)
MCH: 28.5 pg (ref 26.0–34.0)
MCHC: 31.9 g/dL (ref 30.0–36.0)
MCV: 89.4 fL (ref 80.0–100.0)
Platelets: 219 10*3/uL (ref 150–400)
RBC: 3.12 MIL/uL — ABNORMAL LOW (ref 3.87–5.11)
RDW: 14.2 % (ref 11.5–15.5)
WBC: 13.1 10*3/uL — ABNORMAL HIGH (ref 4.0–10.5)
nRBC: 0 % (ref 0.0–0.2)

## 2018-12-28 MED ORDER — MEDROXYPROGESTERONE ACETATE 150 MG/ML IM SUSP
150.0000 mg | Freq: Once | INTRAMUSCULAR | Status: AC
  Administered 2018-12-28: 150 mg via INTRAMUSCULAR
  Filled 2018-12-28: qty 1

## 2018-12-28 MED ORDER — IBUPROFEN 800 MG PO TABS: 800.0000 mg | ORAL_TABLET | Freq: Three times a day (TID) | ORAL | 0 refills | Status: DC

## 2018-12-28 NOTE — Progress Notes (Signed)
CSW acknowledged consult due to MH/ SI and drug overdose.     CSW completed a chart review; CSW is screening out referral since diagnosis of MH disorder and drug overdose and was not within last 3 years. It appears that MOB's SI and drug overdose were a result of her chronic pain.   CSW is also aware that MOB scored a 7 on the Edinburgh Postnatal Depression Scale.    Please contact CSW if other needs arise or if MOB requests to meet with CSW.   Blaine Hamper, MSW, LCSW Clinical Social Work [phone]

## 2019-01-05 ENCOUNTER — Telehealth: Payer: Self-pay | Admitting: Advanced Practice Midwife

## 2019-01-05 NOTE — Telephone Encounter (Signed)
Called the patient with the interrupter to inform of upcoming visits. Interrupter id [id number]. Left a detailed voicemail message concerning the virtual visit.

## 2019-01-07 NOTE — BH Specialist Note (Signed)
Attempt to call and set up patient for [facility] video appointment, though interpreter services, Lafonda Mosses, [place] [id number]. Left HIPPA-compliant message to call back Asher Muir from Center for [facility] at [phone] to reschedule.   Integrated Behavioral Health Initial Visit  MRN: [id number] Name: [name] [facility], LCSW

## 2019-01-08 ENCOUNTER — Ambulatory Visit: Payer: Self-pay

## 2019-01-08 ENCOUNTER — Ambulatory Visit: Payer: Self-pay | Admitting: Clinical

## 2019-01-08 ENCOUNTER — Other Ambulatory Visit: Payer: Self-pay

## 2019-01-12 ENCOUNTER — Ambulatory Visit: Payer: Self-pay

## 2019-01-19 ENCOUNTER — Telehealth: Payer: Self-pay | Admitting: *Deleted

## 2019-01-19 DIAGNOSIS — F329 Major depressive disorder, single episode, unspecified: Secondary | ICD-10-CM

## 2019-01-19 DIAGNOSIS — F32A Depression, unspecified: Secondary | ICD-10-CM

## 2019-01-19 NOTE — Telephone Encounter (Signed)
Received a call from Marton Redwood from [facility] regarding this pt.  Raynelle Fanning reports that she had a conversation with the pt today and the pt states she is having right sided pain at the base of her rib cage.  Pt reports that is is a stabbing pain, it is painful to take a deep breath, gets minimal relief from ibuprofen, and the pain is a 7/10.  Pt denies lower abdominal pain or pain underneath her ribs.  Pt states she also has pain in her right arm, right shoulder, and right neck.  Raynelle Fanning states that she recommended that the pt go to [facility] but the pt states she is unsure if she could make it today or tomorrow. Raynelle Fanning also states that the pt had thoughts 2 weeks ago that she looked at her infant and thought about throwing her away like a toy and then a week later thought about giving the baby away.    Interpreter [phone] used to call pt regarding the above report from [facility]. Pt states she has had the right sided pain since she was discharged from the hospital and she has been having regular bowel movements, and is passing gas frequently.  Recommended pt go to [facility]U.  Pt states she is not able to go tonight but can go early tomorrow once she gets someone to watch her baby.  Asked pt about thoughts of throwing baby away or giving baby. Pt denies any current thoughts of harming her baby. Advised pt that if she has any more thoughts like this that she should ensure that she is not alone with the baby and call the office to speak with someone.  Reassured the pt that these thoughts do not make her a bad mother and do not mean that she doesn't love her baby but can happen and can be overwhelming when they do happen and it's ok to ask for help.  Advised pt that she would receive a call to schedule an appointment with integrated behavioral health.  Pt verbalized understanding.

## 2019-01-20 ENCOUNTER — Encounter (HOSPITAL_COMMUNITY): Payer: Self-pay

## 2019-01-20 ENCOUNTER — Inpatient Hospital Stay (HOSPITAL_COMMUNITY)
Admission: AD | Admit: 2019-01-20 | Discharge: 2019-01-20 | Disposition: A | Discharge status: Home / Self Care | Payer: Self-pay | Attending: Obstetrics and Gynecology | Admitting: Obstetrics and Gynecology

## 2019-01-20 ENCOUNTER — Other Ambulatory Visit: Payer: Self-pay

## 2019-01-20 DIAGNOSIS — M7918 Myalgia, other site: Secondary | ICD-10-CM | POA: Insufficient documentation

## 2019-01-20 DIAGNOSIS — O9089 Other complications of the puerperium, not elsewhere classified: Secondary | ICD-10-CM | POA: Insufficient documentation

## 2019-01-20 DIAGNOSIS — M549 Dorsalgia, unspecified: Secondary | ICD-10-CM

## 2019-01-20 LAB — URINALYSIS, ROUTINE W REFLEX MICROSCOPIC
Bacteria, UA: NONE SEEN
Bilirubin Urine: NEGATIVE
Glucose, UA: NEGATIVE mg/dL
Ketones, ur: NEGATIVE mg/dL
Leukocytes,Ua: NEGATIVE
Nitrite: NEGATIVE
Protein, ur: NEGATIVE mg/dL
Specific Gravity, Urine: 1.026 (ref 1.005–1.030)
pH: 5 (ref 5.0–8.0)

## 2019-01-20 MED ORDER — CYCLOBENZAPRINE HCL 10 MG PO TABS
10.0000 mg | ORAL_TABLET | Freq: Once | ORAL | Status: AC
  Administered 2019-01-20: 11:00:00 10 mg via ORAL
  Filled 2019-01-20: qty 1

## 2019-01-20 NOTE — MAU Provider Note (Signed)
History     CSN: [id number]  Arrival date and time: 01/20/19 0901   First Provider Initiated Contact with Patient 01/20/19 1027      Chief Complaint  Patient presents with   Back Pain   HPI [name] is a 38 y.o. [id number] postpartum patient who presents to MAU with chief complaint of pain in her mid back. She endorses "sore" pain "like someone punched me" on the right side of her mid-back. This is a recurring problem which began on her day of discharge after SVD 04/19. Patient states she is managing her pain with 800mg  Ibuprofen but "the pain comes back as soon as the medicine wears off". Patient last took Ibuprofen at 0500. She denies vaginal bleeding, abnormal vaginal discharge, urinary symptoms, abdominal tenderness,  fever, falls, or recent illness.    OB History    Gravida  8   Para  5   Term  5   Preterm      AB  3   Living  5     SAB  1   TAB  2   Ectopic      Multiple  0   Live Births  5           Past Medical History:  Diagnosis Date   Anxiety    relative to circumstances to back problems/injury, not taking any medicine, pt. reports has been crying a lot   Arthritis    low back   Asthma    Depression    GERD (gastroesophageal reflux disease)    Migraines    Neck pain     Past Surgical History:  Procedure Laterality Date   childbirth     x4 vaginal    LUMBAR LAMINECTOMY/DECOMPRESSION MICRODISCECTOMY  06/05/2012   Procedure: LUMBAR LAMINECTOMY/DECOMPRESSION MICRODISCECTOMY;  Surgeon: Emilee HeroMark Leonard Dumonski, MD;  Location: [facility] OR;  Service: Orthopedics;  Laterality: Left;  Left sided lumbar 4-5 microdisectomy    Family History  Problem Relation Age of Onset   Asthma Brother     Social History   Tobacco Use   Smoking status: Never Smoker   Smokeless tobacco: Never Used  Substance Use Topics   Alcohol use: No   Drug use: No    Allergies:  Allergies  Allergen Reactions   Hydrocodone-Acetaminophen Itching    Has  some itching on face & nose but not bad enough to stop taking it/ MD aware and instructed patient to take anti-allergy medication prn.    Medications Prior to Admission  Medication Sig Dispense Refill Last Dose   acetaminophen (TYLENOL) 500 MG tablet Take 500 mg by mouth every 6 (six) hours as needed.   12/24/2018   calcium carbonate (TUMS - DOSED IN MG ELEMENTAL CALCIUM) 500 MG chewable tablet Chew 2-3 tablets by mouth as needed for indigestion or heartburn.   12/25/2018 at Unknown time   ibuprofen (ADVIL) 800 MG tablet Take 1 tablet (800 mg total) by mouth 3 (three) times daily. 30 tablet 0    loratadine (CLARITIN) 10 MG tablet Take 10 mg by mouth daily.   12/26/2018 at Unknown time   Prenatal Vit-Fe Fumarate-FA (MULTIVITAMIN-PRENATAL) 27-0.8 MG TABS tablet Take 1 tablet by mouth daily at 12 noon.   12/25/2018 at Unknown time    Review of Systems  Constitutional: Negative for chills, fatigue and fever.  Respiratory: Negative for shortness of breath.   Gastrointestinal: Negative for abdominal pain.  Genitourinary: Negative for difficulty urinating, dyspareunia, dysuria, flank pain, vaginal bleeding, vaginal discharge  and vaginal pain.  Musculoskeletal: Positive for back pain.  Neurological: Negative for headaches.  All other systems reviewed and are negative.  Physical Exam   Blood pressure 112/84, pulse 73, temperature 98.4 F (36.9 C), temperature source Oral, resp. rate 18, weight 93.5 kg, SpO2 99 %, currently breastfeeding.  Physical Exam  Nursing note and vitals reviewed. Constitutional: She is oriented to person, place, and time. She appears well-developed and well-nourished.  Cardiovascular: Normal rate.  Respiratory: Effort normal and breath sounds normal. No respiratory distress. She has no wheezes. She has no rales.     She exhibits no tenderness.  GI: Soft. She exhibits no distension. There is no abdominal tenderness. There is no rebound, no guarding and no CVA  tenderness.  Musculoskeletal: Normal range of motion.  Neurological: [name] and oriented to person, place, and time.  Skin: Skin is warm and dry.  Psychiatric: She has a normal mood and affect. Her behavior is normal. Judgment and thought content normal.    MAU Course  Procedures  --Patient is breast and bottle feeding --Pain reduced from 8 to 4 with Flexeril --Pertinent negatives: CVA tenderness, dysuria, abdominal tenderness, fever  Patient Vitals for the past 24 hrs:  BP Temp Temp src Pulse Resp SpO2 Weight  01/20/19 1209 114/80 98 F (36.7 C) Oral 67 18 100 % --  01/20/19 0942 112/84 98.4 F (36.9 C) Oral 73 18 99 % 93.5 kg    Results for orders placed or performed during the hospital encounter of 01/20/19 (from the past 24 hour(s))  Urinalysis, Routine w reflex microscopic     Status: Abnormal   Collection Time: 01/20/19 10:30 AM  Result Value Ref Range   Color, Urine YELLOW YELLOW   APPearance HAZY (A) CLEAR   Specific Gravity, Urine 1.026 1.005 - 1.030   pH 5.0 5.0 - 8.0   Glucose, UA NEGATIVE NEGATIVE mg/dL   Hgb urine dipstick SMALL (A) NEGATIVE   Bilirubin Urine NEGATIVE NEGATIVE   Ketones, ur NEGATIVE NEGATIVE mg/dL   Protein, ur NEGATIVE NEGATIVE mg/dL   Nitrite NEGATIVE NEGATIVE   Leukocytes,Ua NEGATIVE NEGATIVE   RBC / HPF 11-20 0 - 5 RBC/hpf   WBC, UA 0-5 0 - 5 WBC/hpf   Bacteria, UA NONE SEEN NONE SEEN   Squamous Epithelial / LPF 0-5 0 - 5   Mucus PRESENT     Meds ordered this encounter  Medications   cyclobenzaprine (FLEXERIL) tablet 10 mg    Assessment and Plan  --38 y.o. [id number] postpartum patient (s/p SVD 12/27/18) --Musculoskeletal pain --Continue Ibuprofen and Tylenol for pain management --Consider PT/OT consult PRN --Discharge home in stable condition, return for OB/GYN concerns PRN  F/U: Patient has postpartum appt 01/28/19  Calvert Cantor, CNM 01/20/2019, 1:54 PM

## 2019-01-20 NOTE — Discharge Instructions (Signed)
Home Care Instructions for Mom  Activity  · Gradually return to your regular activities.  · Let yourself rest. Nap while your baby sleeps.  · Avoid lifting anything that is heavier than 10 lb (4.5 kg) until your health care provider says it is okay.  · Avoid activities that take a lot of effort and energy (are strenuous) until approved by your health care provider. Walking at a slow-to-moderate pace is usually safe.  · If you had a cesarean delivery:  ? Do not vacuum, climb stairs, or drive a car for 4-6 weeks.  ? Have someone help you at home until you feel like you can do your usual activities yourself.  ? Do exercises as told by your health care provider, if this applies.  Vaginal bleeding  You may continue to bleed for 4-6 weeks after delivery. Over time, the amount of blood usually decreases and the color of the blood usually gets lighter. However, the flow of bright red blood may increase if you have been too active. If you need to use more than one pad in an hour because your pad gets soaked, or if you pass a large clot:  · Lie down.  · Raise your feet.  · Place a cold compress on your lower abdomen.  · Rest.  · Call your health care provider.  If you are breastfeeding, your period should return anytime between 8 weeks after delivery and the time that you stop breastfeeding. If you are not breastfeeding, your period should return 6-8 weeks after delivery.  Perineal care  The perineal area, or perineum, is the part of your body between your thighs. After delivery, this area needs special care. Follow these instructions as told by your health care provider.  · Take warm tub baths for 15-20 minutes.  · Use medicated pads and pain-relieving sprays and creams as told.  · Do not use tampons or douches until vaginal bleeding has stopped.  · Each time you go to the bathroom:  ? Use a peri bottle.  ? Change your pad.  ? Use towelettes in place of toilet paper until your stitches have healed.  · Do Kegel exercises  every day. Kegel exercises help to maintain the muscles that support the vagina, bladder, and bowels. You can do these exercises while you are standing, sitting, or lying down. To do Kegel exercises:  ? Tighten the muscles of your abdomen and the muscles that surround your birth canal.  ? Hold for a few seconds.  ? Relax.  ? Repeat until you have done this 5 times in a row.  · To prevent hemorrhoids from developing or getting worse:  ? Drink enough fluid to keep your urine clear or pale yellow.  ? Avoid straining when having a bowel movement.  ? Take over-the-counter medicines and stool softeners as told by your health care provider.  Breast care  · Wear a tight-fitting bra.  · Avoid taking over-the-counter pain medicine for breast discomfort.  · Apply ice to the breasts to help with discomfort as needed:  ? Put ice in a plastic bag.  ? Place a towel between your skin and the bag.  ? Leave the ice on for 20 minutes or as told by your health care provider.  Nutrition  · Eat a well-balanced diet.  · Do not try to lose weight quickly by cutting back on calories.  · Take your prenatal vitamins until your postpartum checkup or until your health care provider tells   you to stop.  Postpartum depression  You may find yourself crying for no apparent reason and unable to cope with all of the changes that come with having a newborn. This mood is called postpartum depression. Postpartum depression happens because your hormone levels change after delivery. If you have postpartum depression, get support from your partner, friends, and family. If the depression does not go away on its own after several weeks, contact your health care provider.  Breast self-exam    Do a breast self-exam each month, at the same time of the month. If you are breastfeeding, check your breasts just after a feeding, when your breasts are less full. If you are breastfeeding and your period has started, check your breasts on day 5, 6, or 7 of your  period.  Report any lumps, bumps, or discharge to your health care provider. Know that breasts are normally lumpy if you are breastfeeding. This is temporary, and it is not a health risk.  Intimacy and sexuality  Avoid sexual activity for at least 3-4 weeks after delivery or until the brownish-red vaginal flow is completely gone. If you want to avoid pregnancy, use some form of birth control. You can get pregnant after delivery, even if you have not had your period.  Contact a health care provider if:  · You feel unable to cope with the changes that a child brings to your life, and these feelings do not go away after several weeks.  · You notice a lump, a bump, or discharge on your breast.  Get help right away if:  · Blood soaks your pad in 1 hour or less.  · You have:  ? Severe pain or cramping in your lower abdomen.  ? A bad-smelling vaginal discharge.  ? A fever that is not controlled by medicine.  ? A fever, and an area of your breast is red and sore.  ? Pain or redness in your calf.  ? Sudden, severe chest pain.  ? Shortness of breath.  ? Painful or bloody urination.  ? Problems with your vision.  ? You vomit for 12 hours or longer.  ? You develop a severe headache.  ? You have serious thoughts about hurting yourself, your child, or anyone else.  This information is not intended to replace advice given to you by your health care provider. Make sure you discuss any questions you have with your health care provider.  Document Released: 08/24/2000 Document Revised: 10/23/2017 Document Reviewed: 02/28/2015  Elsevier Interactive Patient Education © 2019 Elsevier Inc.

## 2019-01-20 NOTE — MAU Note (Signed)
Pain in back on lower rt side, sometimes goes up to shoulder and neck.  Has been going on since delivery.  Thought it would go away. Has a hard time with some adl (cleaning self after using restroom)because of the pain. Told nurse when she called yesterday, told her to come in to the hosp.  Vag del 4/18.

## 2019-01-22 ENCOUNTER — Ambulatory Visit (INDEPENDENT_AMBULATORY_CARE_PROVIDER_SITE_OTHER): Payer: Self-pay | Admitting: Clinical

## 2019-01-22 ENCOUNTER — Other Ambulatory Visit: Payer: Self-pay

## 2019-01-22 DIAGNOSIS — Z658 Other specified problems related to psychosocial circumstances: Secondary | ICD-10-CM

## 2019-01-22 DIAGNOSIS — F4322 Adjustment disorder with anxiety: Secondary | ICD-10-CM

## 2019-01-22 NOTE — BH Specialist Note (Signed)
Integrated Behavioral Health Initial Visit  MRN: [id number] Name: [name]  Number of Integrated Behavioral Health Clinician visits:: 1/6 Session Start time: 8:01am Session End time: 9:00am Total time: 1 hour  Type of Service: Integrated Behavioral Health- Individual/Family Interpretor:Yes.   Interpretor Name and Language: Myrlene Broker [id number]   Warm Hand Off Completed.       SUBJECTIVE: [name] is a 38 y.o. female accompanied by Newborn Patient was referred by Nicholaus Bloom, MD for depression. Patient reports the following symptoms/concerns: Pt states her primary concern today is food insecurity, and primary symptoms are fatigue, irritability, anxiety, and feeling "slow". Pt does not answer why she has a black eye today, but says she is safe at home.  Duration of problem: Postpartum; Severity of problem: mild  OBJECTIVE: Mood: Normal and Affect: Appropriate Risk of harm to self or others: No plan to harm self or others  LIFE CONTEXT: Family and Social: Supportive friends and family local School/Work: - Self-Care: - Life Changes: Recent childbirth  GOALS ADDRESSED: Patient will: 1. Reduce symptoms of: stress 2. Increase knowledge and/or ability of: healthy habits and stress reduction  3. Demonstrate ability to: Increase healthy adjustment to current life circumstances and Increase adequate support systems for patient/family  INTERVENTIONS: Interventions utilized: Psychoeducation and/or Health Education and Link to [employer]  Standardized Assessments completed: GAD-7 and PHQ 9  ASSESSMENT: Patient currently experiencing Adjustment disorder with anxiety and Psychosocial stress.   Patient may benefit from psychoeducation and brief therapeutic interventions regarding coping with symptoms of anxiety and life stress .  PLAN: 1. Follow up with behavioral health clinician on : As needed 2. Behavioral recommendations:  -Continue using WIC and upcoming EBT;  consider calling [employer] for additional food resource availability from their food pantry -When [employer] paperwork arrives, fill out and send in to address provided on the form -Consider www.postpartum.net online new mom support (Spanish) -Resume taking OTC prenatal vitamin with iron as recommended by medical provider; ask pharmacist for help finding, as needed 3. Referral(s): Integrated Art gallery manager (In Clinic) and [employer] Resources:  Food and New mom support 4. "From scale of 1-10, how likely are you to follow plan?": -  Valetta Close Annissa Andreoni, LCSW  Type of Visit: Webex video Patient location: Home [facility] Provider location: [facility] All persons participating in visit: Patient Stasia Marschke and [facility] Adeli Frost  Confirmed patient's address: Yes  Confirmed patient's phone number: Yes  Any changes to demographics: No   Confirmed patient's insurance: Yes  Any changes to patient's insurance: No  Pt is uninsured  Discussed confidentiality: Yes    The following statements were read to the patient and/or legal guardian that are established with the [employer].  "The purpose of this phone visit is to provide behavioral health care while limiting exposure to the coronavirus (COVID19).  There is a possibility of technology failure and discussed alternative modes of communication if that failure occurs."  "By engaging in this telephone visit, you consent to the provision of healthcare.  Additionally, you authorize for your insurance to be billed for the services provided during this telephone visit."   Patient and/or legal guardian consented to telephone visit: Yes   STRENGTHS (Protective Factors/Coping Skills): Supportive friends and family, receiving WIC; resilient  Abdo Denault C Lempi Edwin

## 2019-01-28 ENCOUNTER — Other Ambulatory Visit: Payer: Self-pay

## 2019-01-28 ENCOUNTER — Ambulatory Visit (INDEPENDENT_AMBULATORY_CARE_PROVIDER_SITE_OTHER): Payer: Self-pay | Admitting: Obstetrics & Gynecology

## 2019-01-28 ENCOUNTER — Encounter: Payer: Self-pay | Admitting: Obstetrics & Gynecology

## 2019-01-28 NOTE — Progress Notes (Signed)
I connected with  [name] on 01/28/19 at  3:15 PM EDT by telephone and verified that I am speaking with the correct person using two identifiers.   I discussed the limitations, risks, security and privacy concerns of performing an evaluation and management service by telephone and the availability of in person appointments. I also discussed with the patient that there may be a patient responsible charge related to this service. The patient expressed understanding and agreed to proceed.  Marylynn Pearson, RN 01/28/2019  3:39 PM   Subjective:     [name] is a 38 y.o. P4 female who presents for a postpartum visit. She is 4 weeks postpartum following a spontaneous vaginal delivery. I have fully reviewed the prenatal and intrapartum course. The delivery was at 40 gestational weeks. Outcome: spontaneous vaginal delivery. Anesthesia: epidural. Postpartum course has been unremarkable. Baby's course has been unremarkable. Baby is feeding by breast. Bleeding staining only. Bowel function is abnormal: patient reports constipation. Bladder function is normal. Patient is not sexually active. Contraception method is Depo-Provera injections. Postpartum depression screening: negative.  The following portions of the patient's history were reviewed and updated as appropriate: allergies, current medications, past family history, past medical history, past social history, past surgical history and problem list.  Review of Systems Pertinent items are noted in HPI.   Pap 1/20 ASCUS with negative HR HPV  Objective:    Breastfeeding Yes    Assessment:     postpartum exam.  Plan:    1. Contraception- was given depo provera in the hospital - She will call the health dept for appt in 8 weeks for repeat depo provera 2. H/o ASCUS- will need repeat pap at health dept 1/21 3. Rec weight loss 4. Rec that if her back pain continues, then she should see her orthopedist ( h/o lumbar fusion)

## 2019-01-29 ENCOUNTER — Other Ambulatory Visit: Payer: Self-pay

## 2019-01-29 ENCOUNTER — Inpatient Hospital Stay (HOSPITAL_COMMUNITY)
Admission: AD | Admit: 2019-01-29 | Discharge: 2019-01-30 | Disposition: A | Discharge status: Home / Self Care | Payer: Self-pay | Attending: Emergency Medicine | Admitting: Emergency Medicine

## 2019-01-29 ENCOUNTER — Encounter (HOSPITAL_COMMUNITY): Payer: Self-pay | Admitting: Emergency Medicine

## 2019-01-29 DIAGNOSIS — K8020 Calculus of gallbladder without cholecystitis without obstruction: Secondary | ICD-10-CM | POA: Insufficient documentation

## 2019-01-29 DIAGNOSIS — R1011 Right upper quadrant pain: Secondary | ICD-10-CM

## 2019-01-29 DIAGNOSIS — Z79899 Other long term (current) drug therapy: Secondary | ICD-10-CM | POA: Insufficient documentation

## 2019-01-29 DIAGNOSIS — F419 Anxiety disorder, unspecified: Secondary | ICD-10-CM | POA: Insufficient documentation

## 2019-01-29 DIAGNOSIS — F329 Major depressive disorder, single episode, unspecified: Secondary | ICD-10-CM | POA: Insufficient documentation

## 2019-01-29 DIAGNOSIS — J45909 Unspecified asthma, uncomplicated: Secondary | ICD-10-CM | POA: Insufficient documentation

## 2019-01-29 LAB — CBC
HCT: 36.8 % (ref 36.0–46.0)
Hemoglobin: 12.2 g/dL (ref 12.0–15.0)
MCH: 28.7 pg (ref 26.0–34.0)
MCHC: 33.2 g/dL (ref 30.0–36.0)
MCV: 86.6 fL (ref 80.0–100.0)
Platelets: 309 10*3/uL (ref 150–400)
RBC: 4.25 MIL/uL (ref 3.87–5.11)
RDW: 13 % (ref 11.5–15.5)
WBC: 13.7 10*3/uL — ABNORMAL HIGH (ref 4.0–10.5)
nRBC: 0 % (ref 0.0–0.2)

## 2019-01-29 MED ORDER — SODIUM CHLORIDE 0.9% FLUSH
3.0000 mL | Freq: Once | INTRAVENOUS | Status: AC
  Administered 2019-01-30: 3 mL via INTRAVENOUS

## 2019-01-29 NOTE — MAU Note (Signed)
Artelia Laroche CNM in Triage to see pt and discuss plan of care.

## 2019-01-29 NOTE — MAU Provider Note (Signed)
Chief Complaint:  Abdominal Pain and Back Pain   First Provider Initiated Contact with Patient 01/29/19 2241     HPI: Patricia StakesOsbelia Brandt is a 38 y.o. [id number] who presents to maternity admissions reporting upper abdominal pain which radiates in to thoracic area of her back, mostly right side.  Also has nausea. All this started at 1700 hrs.  Has never had this before.  Is one month post partum and has no bleeding or lower abdominal pain.  . She reports no vaginal itching/burning, urinary symptoms, h/a, dizziness, or fever/chills.     Past Medical History: Past Medical History:  Diagnosis Date  . Anxiety    relative to circumstances to back problems/injury, not taking any medicine, pt. reports has been crying a lot  . Arthritis    low back  . Asthma   . Depression   . GERD (gastroesophageal reflux disease)   . Migraines   . Neck pain     Past obstetric history: OB History  Gravida Para Term Preterm AB Living  8 5 5   3 5   SAB TAB Ectopic Multiple Live Births  1 2   0 5    # Outcome Date GA Lbr Len/2nd Weight Sex Delivery Anes PTL Lv  8 Term 12/27/18 10862w1d 08:45 / 00:19 3980 g M Vag-Spont EPI  LIV  7 SAB 11/2017          6 TAB 2016          5 Term 01/08/09 2152w0d   F Vag-Spont   LIV  4 Term 04/23/07 5252w0d   M Vag-Spont   LIV  3 Term 04/10/02 6252w0d   F Vag-Spont   LIV  2 Term 03/30/98 8052w0d   M Vag-Spont   LIV  1 TAB             Past Surgical History: Past Surgical History:  Procedure Laterality Date  . childbirth     x4 vaginal   . LUMBAR LAMINECTOMY/DECOMPRESSION MICRODISCECTOMY  06/05/2012   Procedure: LUMBAR LAMINECTOMY/DECOMPRESSION MICRODISCECTOMY;  Surgeon: Emilee HeroMark Leonard Dumonski, MD;  [facility];  Service: Orthopedics;  Laterality: Left;  Left sided lumbar 4-5 microdisectomy    Family History: Family History  Problem Relation Age of Onset  . Asthma Brother     Social History: Social History   Tobacco Use  . Smoking status: Never Smoker  . Smokeless tobacco:  Never Used  Substance Use Topics  . Alcohol use: No  . Drug use: No    Allergies:  Allergies  Allergen Reactions  . Hydrocodone-Acetaminophen Itching    Has some itching on face & nose but not bad enough to stop taking it/ MD aware and instructed patient to take anti-allergy medication prn.    Meds:  Medications Prior to Admission  Medication Sig Dispense Refill Last Dose  . ibuprofen (ADVIL) 800 MG tablet Take 1 tablet (800 mg total) by mouth 3 (three) times daily. (Patient not taking: Reported on 01/28/2019) 30 tablet 0 Not Taking  . loratadine (CLARITIN) 10 MG tablet Take 10 mg by mouth daily.   Taking    I have reviewed patient's Past Medical Hx, Surgical Hx, Family Hx, Social Hx, medications and allergies.  ROS:  Review of Systems  Constitutional: Negative for chills and fever.  Respiratory: Negative for shortness of breath.   Gastrointestinal: Positive for abdominal pain and nausea. Negative for constipation, diarrhea and vomiting.  Genitourinary: Positive for flank pain (some on right, but describes pain as wrapping all around).  Negative for dysuria, pelvic pain, vaginal bleeding and vaginal discharge.  Musculoskeletal: Positive for back pain.   Other systems negative   Physical Exam   Patient Vitals for the past 24 hrs:  BP Temp Pulse Resp SpO2 Height Weight  01/29/19 2235 - - - - - - 91.6 kg  01/29/19 2231 (!) 122/91 98.2 F (36.8 C) 84 20 100 % 5' (1.524 m) -   Constitutional: Well-developed, well-nourished female in no acute distress.  Cardiovascular: normal rate and rhythm MS: Extremities nontender, no edema, normal ROM Neurologic: Alert and oriented x 4.   Grossly nonfocal. Skin:  Warm and Dry Psych:  Affect appropriate.  PELVIC EXAM: deferred/not indicated   Labs: No results found for this or any previous visit (from the past 24 hour(s)). --/--/O POS (04/17 1136)  Imaging:  No results found.  MAU Course/MDM: I have recommended she be seen in main  ED.  There are no apparent Gynecological or postpartum indications for care.  Assessment: Upper abdominal pain, radiating to back Nausea  Plan: Transfer to ED Per Triage nurse, have patient wait in waiting room   Wynelle Bourgeois CNM, MSN Certified Nurse-Midwife 01/29/2019 10:42 PM

## 2019-01-29 NOTE — MAU Note (Signed)
Report called to [facility] ED Charge Nurse , [facility] RN. Pt transported by w/c to [facility] ED sign in desk.

## 2019-01-29 NOTE — ED Triage Notes (Addendum)
C/o severe upper abd, epigastric pain, and chest pain that radiates to back x 1 hour with SOB, nausea, and vomiting.

## 2019-01-29 NOTE — MAU Note (Addendum)
Upper abd pain and mid back pain for an hour. Vomiting started same time due to pain. ATe 1800 meat, vegetables and rice. No vag bleeeding. Breastfeeding. No diarrhea

## 2019-01-30 ENCOUNTER — Inpatient Hospital Stay (HOSPITAL_COMMUNITY): Payer: Self-pay

## 2019-01-30 LAB — URINALYSIS, ROUTINE W REFLEX MICROSCOPIC
Bilirubin Urine: NEGATIVE
Glucose, UA: NEGATIVE mg/dL
Hgb urine dipstick: NEGATIVE
Ketones, ur: NEGATIVE mg/dL
Leukocytes,Ua: NEGATIVE
Nitrite: NEGATIVE
Protein, ur: NEGATIVE mg/dL
Specific Gravity, Urine: 1.028 (ref 1.005–1.030)
pH: 5 (ref 5.0–8.0)

## 2019-01-30 LAB — COMPREHENSIVE METABOLIC PANEL
ALT: 65 U/L — ABNORMAL HIGH (ref 0–44)
AST: 49 U/L — ABNORMAL HIGH (ref 15–41)
Albumin: 3.6 g/dL (ref 3.5–5.0)
Alkaline Phosphatase: 112 U/L (ref 38–126)
Anion gap: 12 (ref 5–15)
BUN: 14 mg/dL (ref 6–20)
CO2: 22 mmol/L (ref 22–32)
Calcium: 8.9 mg/dL (ref 8.9–10.3)
Chloride: 104 mmol/L (ref 98–111)
Creatinine, Ser: 0.83 mg/dL (ref 0.44–1.00)
GFR calc Af Amer: >60 mL/min (ref >60)
GFR calc non Af Amer: >60 mL/min (ref >60)
Glucose, Bld: 144 mg/dL — ABNORMAL HIGH (ref 70–99)
Potassium: 3.5 mmol/L (ref 3.5–5.1)
Sodium: 138 mmol/L (ref 135–145)
Total Bilirubin: 0.4 mg/dL (ref 0.3–1.2)
Total Protein: 7.2 g/dL (ref 6.5–8.1)

## 2019-01-30 LAB — LIPASE, BLOOD: Lipase: 29 U/L (ref 11–51)

## 2019-01-30 LAB — I-STAT BETA HCG BLOOD, ED (MC, WL, AP ONLY): I-stat hCG, quantitative: <5 m[IU]/mL (ref <5)

## 2019-01-30 LAB — TROPONIN I: Troponin I: <0.03 ng/mL (ref <0.03)

## 2019-01-30 MED ORDER — ONDANSETRON 4 MG PO TBDP: 4.0000 mg | ORAL_TABLET | Freq: Four times a day (QID) | ORAL | 0 refills | Status: DC | PRN

## 2019-01-30 MED ORDER — IBUPROFEN 800 MG PO TABS: 800.0000 mg | ORAL_TABLET | Freq: Three times a day (TID) | ORAL | 0 refills | Status: DC | PRN

## 2019-01-30 MED ORDER — KETOROLAC TROMETHAMINE 30 MG/ML IJ SOLN
30.0000 mg | Freq: Once | INTRAMUSCULAR | Status: AC
  Administered 2019-01-30: 30 mg via INTRAVENOUS
  Filled 2019-01-30: qty 1

## 2019-01-30 MED ORDER — DICYCLOMINE HCL 20 MG PO TABS: 20.0000 mg | ORAL_TABLET | Freq: Three times a day (TID) | ORAL | 0 refills | Status: DC | PRN

## 2019-01-30 MED ORDER — MORPHINE SULFATE (PF) 4 MG/ML IV SOLN
4.0000 mg | Freq: Once | INTRAVENOUS | Status: DC
  Filled 2019-01-30: qty 1

## 2019-01-30 MED ORDER — ONDANSETRON HCL 4 MG/2ML IJ SOLN
4.0000 mg | Freq: Once | INTRAMUSCULAR | Status: AC
  Administered 2019-01-30: 4 mg via INTRAVENOUS
  Filled 2019-01-30: qty 2

## 2019-01-30 NOTE — ED Notes (Signed)
Patient transported to Ultrasound 

## 2019-01-30 NOTE — ED Notes (Signed)
Patient transported to X-ray 

## 2019-01-30 NOTE — Discharge Instructions (Signed)
You may alternate Tylenol 1000 mg every 6 hours as needed for pain and Ibuprofen 800 mg every 8 hours as needed for pain.  Please take Ibuprofen with food. ° °

## 2019-01-30 NOTE — ED Provider Notes (Signed)
TIME SEEN: 12:15 AM  CHIEF COMPLAINT: Abdominal pain, chest pain, back pain  HPI: Patient is a 38 year old female with history of asthma who presents to the emergency department with upper abdominal pain that is described as a squeezing, pressure pain that radiates up into her chest and into her upper back that started tonight after eating dinner and drinking a cup of coffee.  States she ate meat, rice and vegetables for dinner.  No known history of gallstones.  Took 800 mg of ibuprofen at home but then began vomiting.  Denies fevers, chills, diarrhea, dysuria, hematuria, vaginal bleeding or discharge.  No previous abdominal surgeries.  States that she feels she cannot get a good deep breath because it worsens her abdominal pain.  States "I feel that there is something in my lungs".  Back pain is not reproducible with movement or palpation.  Denies cough.  No history of PE, DVT, exogenous estrogen use, recent fractures, surgery, trauma, hospitalization or prolonged travel. No lower extremity swelling or pain. No calf tenderness.  Offered Spanish interpreter which patient declines.  ROS: See HPI Constitutional: no fever  Eyes: no drainage  ENT: no runny nose   Cardiovascular:   chest pain  Resp: SOB  GI:  vomiting GU: no dysuria Integumentary: no rash  Allergy: no hives  Musculoskeletal: no leg swelling  Neurological: no slurred speech ROS otherwise negative  PAST MEDICAL HISTORY/PAST SURGICAL HISTORY:  Past Medical History:  Diagnosis Date  . Anxiety    relative to circumstances to back problems/injury, not taking any medicine, pt. reports has been crying a lot  . Arthritis    low back  . Asthma   . Depression   . GERD (gastroesophageal reflux disease)   . Migraines   . Neck pain     MEDICATIONS:  Prior to Admission medications   Medication Sig Start Date End Date Taking? Authorizing Provider  ibuprofen (ADVIL) 800 MG tablet Take 1 tablet (800 mg total) by mouth 3 (three) times  daily. Patient not taking: Reported on 01/28/2019 12/28/18   Tamera Stands, DO  loratadine (CLARITIN) 10 MG tablet Take 10 mg by mouth daily.    [provider]    ALLERGIES:  Allergies  Allergen Reactions  . Hydrocodone-Acetaminophen Itching    Has some itching on face & nose but not bad enough to stop taking it/ MD aware and instructed patient to take anti-allergy medication prn.    SOCIAL HISTORY:  Social History   Tobacco Use  . Smoking status: Never Smoker  . Smokeless tobacco: Never Used  Substance Use Topics  . Alcohol use: No    FAMILY HISTORY: Family History  Problem Relation Age of Onset  . Asthma Brother     EXAM: BP 135/87 (BP Location: Right Arm)   Pulse 78   Temp 97.8 F (36.6 C) (Oral)   Resp 19   Ht 5' (1.524 m)   Wt 91.6 kg   SpO2 100%   Breastfeeding Yes   BMI 39.45 kg/m  CONSTITUTIONAL: Alert and oriented and responds appropriately to questions. Well-appearing; well-nourished HEAD: Normocephalic EYES: Conjunctivae clear, pupils appear equal, EOMI ENT: normal nose; moist mucous membranes NECK: Supple, no meningismus, no nuchal rigidity, no LAD  CARD: RRR; S1 and S2 appreciated; no murmurs, no clicks, no rubs, no gallops RESP: Normal chest excursion without splinting or tachypnea; breath sounds clear and equal bilaterally; no wheezes, no rhonchi, no rales, no hypoxia or respiratory distress, speaking full sentences ABD/GI: Normal bowel sounds;  non-distended; soft, tender to palpation in the epigastric region and right upper quadrant with negative Murphy sign, no tenderness at McBurney's point, no rebound, no guarding, no peritoneal signs, no hepatosplenomegaly BACK:  The back appears normal and is non-tender to palpation, there is no CVA tenderness EXT: Normal ROM in all joints; non-tender to palpation; no edema; normal capillary refill; no cyanosis, no calf tenderness or swelling    SKIN: Normal color for age and race; warm; no  rash NEURO: Moves all extremities equally PSYCH: The patient's mood and manner are appropriate. Grooming and personal hygiene are appropriate.  MEDICAL DECISION MAKING: With complaints of upper abdominal pain in the chest and back.  I have low suspicion for ACS, PE or dissection.  Patient is PERC negative.  EKG shows no ischemic abnormality and troponin is negative.  She states she feels that she cannot take a deep breath but feels like this is because it worsens her abdominal pain.  Differential includes GERD, gastritis, cholelithiasis.  Less likely pancreatitis given normal lipase.  Lower suspicion for cholecystitis given no fever, leukocytosis, normal LFTs and negative Murphy sign today.  Will obtain right upper quadrant ultrasound as well as a chest x-ray.  Will give IV pain and nausea medicine.  She states her boyfriend can drive her home.  ED PROGRESS: Patient's ultrasound shows cholelithiasis and sludge with no wall thickening, pericholecystic fluid.  She did have a Murphy sign with the ultrasonographer but did not have a positive Murphy sign in the ED.  I have low suspicion that this is cholecystitis.  Will p.o. challenge patient and reassess pain.  2:43 AM  Pt reports feeling much better after Toradol.  She declines any further pain medication.  She is drinking without difficulty and no recurrent vomiting.  Discussed findings with patient and recommended follow-up with general surgery as an outpatient if symptoms continue.  We did discuss return precautions and signs of symptoms of acute cholecystitis.  Will discharge with prescriptions of Bentyl, ibuprofen, Zofran.  She is currently breast-feeding and would like to avoid narcotics.   At this time, I do not feel there is any life-threatening condition present. I have reviewed and discussed all results (EKG, imaging, lab, urine as appropriate) and exam findings with patient/family. I have reviewed nursing notes and appropriate previous records.  I  feel the patient is safe to be discharged home without further emergent workup and can continue workup as an outpatient as needed. Discussed usual and customary return precautions. Patient/family verbalize understanding and are comfortable with this plan.  Outpatient follow-up has been provided as needed. All questions have been answered.    EKG Interpretation  Date/Time:  Thursday Jan 29 2019 23:22:09 EDT Ventricular Rate:  76 PR Interval:  152 QRS Duration: 80 QT Interval:  394 QTC Calculation: 443 R Axis:   99 Text Interpretation:  Normal sinus rhythm Rightward axis Septal infarct , age undetermined Abnormal ECG No significant change since last tracing Confirmed by Jahlen Bollman, Baxter HireKristen [phone]([id number]) on 01/30/2019 12:15:16 AM         Arnold Kester, Layla MawKristen N, DO 01/30/19 [id number]

## 2019-01-30 NOTE — ED Notes (Signed)
Pt to imaging

## 2019-01-30 NOTE — ED Notes (Signed)
Pt given ginger ale.

## 2020-01-03 ENCOUNTER — Emergency Department (HOSPITAL_COMMUNITY): Payer: Self-pay

## 2020-01-03 ENCOUNTER — Other Ambulatory Visit: Payer: Self-pay

## 2020-01-03 ENCOUNTER — Emergency Department (HOSPITAL_COMMUNITY)
Admission: EM | Admit: 2020-01-03 | Discharge: 2020-01-03 | Disposition: A | Discharge status: Home / Self Care | Payer: Self-pay | Attending: Emergency Medicine | Admitting: Emergency Medicine

## 2020-01-03 DIAGNOSIS — J4521 Mild intermittent asthma with (acute) exacerbation: Secondary | ICD-10-CM | POA: Insufficient documentation

## 2020-01-03 DIAGNOSIS — Z8616 Personal history of COVID-19: Secondary | ICD-10-CM | POA: Insufficient documentation

## 2020-01-03 LAB — BASIC METABOLIC PANEL
Anion gap: 10 (ref 5–15)
BUN: 10 mg/dL (ref 6–20)
CO2: 20 mmol/L — ABNORMAL LOW (ref 22–32)
Calcium: 8.8 mg/dL — ABNORMAL LOW (ref 8.9–10.3)
Chloride: 106 mmol/L (ref 98–111)
Creatinine, Ser: 0.76 mg/dL (ref 0.44–1.00)
GFR calc Af Amer: >60 mL/min (ref >60)
GFR calc non Af Amer: >60 mL/min (ref >60)
Glucose, Bld: 106 mg/dL — ABNORMAL HIGH (ref 70–99)
Potassium: 3.6 mmol/L (ref 3.5–5.1)
Sodium: 136 mmol/L (ref 135–145)

## 2020-01-03 LAB — CBC WITH DIFFERENTIAL/PLATELET
Abs Immature Granulocytes: 0.02 10*3/uL (ref 0.00–0.07)
Basophils Absolute: 0.1 10*3/uL (ref 0.0–0.1)
Basophils Relative: 1 %
Eosinophils Absolute: 0.5 10*3/uL (ref 0.0–0.5)
Eosinophils Relative: 6 %
HCT: 38.6 % (ref 36.0–46.0)
Hemoglobin: 12.2 g/dL (ref 12.0–15.0)
Immature Granulocytes: 0 %
Lymphocytes Relative: 42 %
Lymphs Abs: 3.3 10*3/uL (ref 0.7–4.0)
MCH: 28.5 pg (ref 26.0–34.0)
MCHC: 31.6 g/dL (ref 30.0–36.0)
MCV: 90.2 fL (ref 80.0–100.0)
Monocytes Absolute: 0.5 10*3/uL (ref 0.1–1.0)
Monocytes Relative: 7 %
Neutro Abs: 3.6 10*3/uL (ref 1.7–7.7)
Neutrophils Relative %: 44 %
Platelets: 355 10*3/uL (ref 150–400)
RBC: 4.28 MIL/uL (ref 3.87–5.11)
RDW: 12.5 % (ref 11.5–15.5)
WBC: 8 10*3/uL (ref 4.0–10.5)
nRBC: 0 % (ref 0.0–0.2)

## 2020-01-03 LAB — BRAIN NATRIURETIC PEPTIDE: B Natriuretic Peptide: 37.9 pg/mL (ref 0.0–100.0)

## 2020-01-03 LAB — I-STAT BETA HCG BLOOD, ED (MC, WL, AP ONLY): I-stat hCG, quantitative: <5 m[IU]/mL (ref <5)

## 2020-01-03 MED ORDER — MAGNESIUM SULFATE 2 GM/50ML IV SOLN
2.0000 g | Freq: Once | INTRAVENOUS | Status: AC
  Administered 2020-01-03: 2 g via INTRAVENOUS
  Filled 2020-01-03: qty 50

## 2020-01-03 MED ORDER — PREDNISONE 20 MG PO TABS
ORAL_TABLET | ORAL | 0 refills | Status: DC
Start: 2020-01-03 — End: 2020-08-26

## 2020-01-03 MED ORDER — ONDANSETRON HCL 4 MG/2ML IJ SOLN
4.0000 mg | Freq: Once | INTRAMUSCULAR | Status: AC
  Administered 2020-01-03: 4 mg via INTRAVENOUS
  Filled 2020-01-03: qty 2

## 2020-01-03 MED ORDER — ALBUTEROL SULFATE HFA 108 (90 BASE) MCG/ACT IN AERS
2.0000 | INHALATION_SPRAY | Freq: Once | RESPIRATORY_TRACT | Status: AC
  Administered 2020-01-03: 2 via RESPIRATORY_TRACT
  Filled 2020-01-03: qty 6.7

## 2020-01-03 MED ORDER — METHYLPREDNISOLONE SODIUM SUCC 125 MG IJ SOLR
125.0000 mg | Freq: Once | INTRAMUSCULAR | Status: AC
  Administered 2020-01-03: 125 mg via INTRAVENOUS
  Filled 2020-01-03: qty 2

## 2020-01-03 MED ORDER — IPRATROPIUM-ALBUTEROL 0.5-2.5 (3) MG/3ML IN SOLN
3.0000 mL | Freq: Once | RESPIRATORY_TRACT | Status: AC
  Administered 2020-01-03: 19:00:00 3 mL via RESPIRATORY_TRACT
  Filled 2020-01-03: qty 3

## 2020-01-03 NOTE — Discharge Instructions (Addendum)
Use albuterol inhaler 2 puffs every 4 hours as needed for your shortness of breath.  Take steroid as prescribed for the next several days.  Return to the ER if your condition worsen or if you have any other concern.

## 2020-01-03 NOTE — ED Provider Notes (Signed)
[facility] EMERGENCY DEPARTMENT Provider Note   CSN: [id number] Arrival date & time: 01/03/20  1900     History Chief Complaint  Patient presents with  . Respiratory Distress    [name] is a 39 y.o. female.  The history is provided by the patient and medical records. No language interpreter was used.     39 year old female with history of asthma, anxiety, migraine, depression, presenting for evaluation of respiratory discomfort.  History is limited as patient is having difficulty breathing.  Patient states she is allergic to pollen, and today she developed acute onset of shortness of breath and wheezing and cannot catch her breath.  She did not report of any fever or chills no body aches no cold symptoms.  She does not have inhaler available.  Past Medical History:  Diagnosis Date  . Anxiety    relative to circumstances to back problems/injury, not taking any medicine, pt. reports has been crying a lot  . Arthritis    low back  . Asthma   . Depression   . GERD (gastroesophageal reflux disease)   . Migraines   . Neck pain     Patient Active Problem List   Diagnosis Date Noted  . SVD (spontaneous vaginal delivery) 12/27/2018  . AMA (advanced maternal age) multigravida 35+ 12/26/2018  . Language barrier 11/21/2018  . Obesity in pregnancy 11/07/2018  . Supervision of high risk pregnancy, antepartum 10/20/2018  . Multigravida of advanced maternal age in third trimester 10/20/2018  . ASCUS of cervix with negative high risk HPV 10/20/2018  . PTSD (post-traumatic stress disorder) 05/29/2013  . Depression with suicidal ideation 05/29/2013  . Suicide attempt by multiple drug overdose (HCC) 05/29/2013  . Chronic pain following surgery or procedure 05/29/2013  . HNP (herniated nucleus pulposus), lumbar 02/04/2013    Past Surgical History:  Procedure Laterality Date  . childbirth     x4 vaginal   . LUMBAR LAMINECTOMY/DECOMPRESSION MICRODISCECTOMY   06/05/2012   Procedure: LUMBAR LAMINECTOMY/DECOMPRESSION MICRODISCECTOMY;  Surgeon: Emilee Hero, MD;  Location: [facility] OR;  Service: Orthopedics;  Laterality: Left;  Left sided lumbar 4-5 microdisectomy     OB History    Gravida  8   Para  5   Term  5   Preterm      AB  3   Living  5     SAB  1   TAB  2   Ectopic      Multiple  0   Live Births  5           Family History  Problem Relation Age of Onset  . Asthma Brother     Social History   Tobacco Use  . Smoking status: Never Smoker  . Smokeless tobacco: Never Used  Substance Use Topics  . Alcohol use: No  . Drug use: No    Home Medications Prior to Admission medications   Medication Sig Start Date End Date Taking? Authorizing Provider  dicyclomine (BENTYL) 20 MG tablet Take 1 tablet (20 mg total) by mouth every 8 (eight) hours as needed for spasms (Abdominal cramping). 01/30/19   Ward, Layla Maw, DO  ibuprofen (ADVIL) 800 MG tablet Take 1 tablet (800 mg total) by mouth every 8 (eight) hours as needed for mild pain. 01/30/19   Ward, Layla Maw, DO  loratadine (CLARITIN) 10 MG tablet Take 10 mg by mouth daily.    [provider]  ondansetron (ZOFRAN ODT) 4 MG disintegrating tablet Take 1  tablet (4 mg total) by mouth every 6 (six) hours as needed. 01/30/19   Ward, Layla Maw, DO    Allergies    Hydrocodone-acetaminophen  Review of Systems   Review of Systems  Unable to perform ROS: Severe respiratory distress    Physical Exam Updated Vital Signs BP 140/79   Pulse 92   Resp 18   Ht 5' (1.524 m)   Wt 92 kg   SpO2 99%   BMI 39.61 kg/m   Physical Exam Vitals and nursing note reviewed.  Constitutional:      General: She is not in acute distress.    Appearance: She is well-developed.     Comments: Patient is leaning forward, actively wheezing and having difficulty breathing it appears uncomfortable.  HENT:     Head: Atraumatic.  Eyes:     Conjunctiva/sclera: Conjunctivae normal.    Cardiovascular:     Rate and Rhythm: Tachycardia present.  Pulmonary:     Breath sounds: Wheezing present.     Comments: Patient is tachypneic, with expiratory wheezes and decreased breath sounds throughout. Abdominal:     Palpations: Abdomen is soft.  Musculoskeletal:     Cervical back: Neck supple.  Skin:    Findings: No rash.     Comments: Bruising noted to right anterior knee.  Neurological:     Mental Status: [name].  Psychiatric:        Mood and Affect: Mood normal.     ED Results / Procedures / Treatments   Labs (all labs ordered are listed, but only abnormal results are displayed) Labs Reviewed  BASIC METABOLIC PANEL - Abnormal; Notable for the following components:      Result Value   CO2 20 (*)    Glucose, Bld 106 (*)    Calcium 8.8 (*)    All other components within normal limits  CBC WITH DIFFERENTIAL/PLATELET  BRAIN NATRIURETIC PEPTIDE  I-STAT BETA HCG BLOOD, ED ([facility], [facility], [facility] ONLY)    EKG None   Date: 01/03/2020  Rate: 83  Rhythm: normal sinus rhythm  QRS Axis: RAD  Intervals: normal  ST/T Wave abnormalities: normal  Conduction Disutrbances: none  Narrative Interpretation:   Old EKG Reviewed: No significant changes noted EKG reviewed and interpreted by me.     Radiology DG Chest Port 1 View  Result Date: 01/03/2020 CLINICAL DATA:  Short of breath, respiratory distress EXAM: PORTABLE CHEST 1 VIEW COMPARISON:  01/30/2019 FINDINGS: The heart size and mediastinal contours are within normal limits. Both lungs are clear. The visualized skeletal structures are unremarkable. IMPRESSION: No active disease. Electronically Signed   By: Sharlet Salina M.D.   On: 01/03/2020 19:35    Procedures Procedures (including critical care time)  Medications Ordered in ED Medications  ipratropium-albuterol (DUONEB) 0.5-2.5 (3) MG/3ML nebulizer solution 3 mL (has no administration in time range)  magnesium sulfate IVPB 2 g 50 mL (has no administration in time  range)  methylPREDNISolone sodium succinate (SOLU-MEDROL) 125 mg/2 mL injection 125 mg (has no administration in time range)    ED Course  I have reviewed the triage vital signs and the nursing notes.  Pertinent labs & imaging results that were available during my care of the patient were reviewed by me and considered in my medical decision making (see chart for details).    MDM Rules/Calculators/A&P                      BP 140/79   Pulse 92  Resp 18   Ht 5' (1.524 m)   Wt 92 kg   SpO2 99%   BMI 39.61 kg/m   Final Clinical Impression(s) / ED Diagnoses Final diagnoses:  Mild intermittent asthma with exacerbation    Rx / DC Orders ED Discharge Orders         Ordered    predniSONE (DELTASONE) 20 MG tablet     01/03/20 2222         7:13 PM Patient here with an apparent asthma exacerbation as she is actively wheezing, having difficulty breathing or talking and appears uncomfortable.  It seems that her asthma exacerbation is brought on by pollens as she is allergic to it.  Will provide a treatment which include duo nebs, steroids, and magnesium sulfate.  9:44 PM Patient felt better after treatment.  At this time she is resting comfortably.  She ambulates without hypoxia.  Patient discharged home with albuterol inhaler as well as a course of steroid.  Return precaution discussed.  Patient reports she was sick with COVID-19 several months ago and she is not concerned of Covid infection at this time.   Domenic Moras, PA-C 01/03/20 [place], [place], DO 01/03/20 2234

## 2020-01-03 NOTE — ED Triage Notes (Signed)
Pt arrives from home for CC of shortness of breath for approx 30 mins. Pt was in car, cough 3x and had sudden onset of SOB being unable to breathe. Pt has hx of asthma. Pt reports running out of rescue inhaler. Wheezing and increased WOB.

## 2020-01-03 NOTE — ED Notes (Signed)
Pt ambulated oxygen went from 98 to 94

## 2020-01-03 NOTE — ED Notes (Signed)
Pt ambulatory to and from restroom with no assistance needed. Steady gait.  

## 2020-01-03 NOTE — ED Notes (Signed)
Patient verbalizes understanding of discharge instructions. Opportunity for questioning and answers were provided. Armband removed by staff, pt discharged from ED ambulatory.   

## 2020-04-07 ENCOUNTER — Inpatient Hospital Stay (HOSPITAL_COMMUNITY)
Admission: EM | Admit: 2020-04-07 | Discharge: 2020-04-08 | Disposition: A | Discharge status: Home / Self Care | Payer: Self-pay | Attending: Emergency Medicine | Admitting: Emergency Medicine

## 2020-04-07 ENCOUNTER — Other Ambulatory Visit: Payer: Self-pay

## 2020-04-07 DIAGNOSIS — Z885 Allergy status to narcotic agent status: Secondary | ICD-10-CM | POA: Insufficient documentation

## 2020-04-07 DIAGNOSIS — R141 Gas pain: Secondary | ICD-10-CM

## 2020-04-07 DIAGNOSIS — O99891 Other specified diseases and conditions complicating pregnancy: Secondary | ICD-10-CM | POA: Insufficient documentation

## 2020-04-07 DIAGNOSIS — O219 Vomiting of pregnancy, unspecified: Secondary | ICD-10-CM

## 2020-04-07 DIAGNOSIS — O09521 Supervision of elderly multigravida, first trimester: Secondary | ICD-10-CM | POA: Insufficient documentation

## 2020-04-07 DIAGNOSIS — O26899 Other specified pregnancy related conditions, unspecified trimester: Secondary | ICD-10-CM

## 2020-04-07 DIAGNOSIS — O26619 Liver and biliary tract disorders in pregnancy, unspecified trimester: Secondary | ICD-10-CM

## 2020-04-07 DIAGNOSIS — Z3A01 Less than 8 weeks gestation of pregnancy: Secondary | ICD-10-CM | POA: Insufficient documentation

## 2020-04-07 DIAGNOSIS — O99611 Diseases of the digestive system complicating pregnancy, first trimester: Secondary | ICD-10-CM | POA: Insufficient documentation

## 2020-04-07 DIAGNOSIS — J45909 Unspecified asthma, uncomplicated: Secondary | ICD-10-CM | POA: Insufficient documentation

## 2020-04-07 DIAGNOSIS — Z7952 Long term (current) use of systemic steroids: Secondary | ICD-10-CM | POA: Insufficient documentation

## 2020-04-07 DIAGNOSIS — R109 Unspecified abdominal pain: Secondary | ICD-10-CM | POA: Insufficient documentation

## 2020-04-07 DIAGNOSIS — R102 Pelvic and perineal pain: Secondary | ICD-10-CM

## 2020-04-07 DIAGNOSIS — K219 Gastro-esophageal reflux disease without esophagitis: Secondary | ICD-10-CM | POA: Insufficient documentation

## 2020-04-07 DIAGNOSIS — Z791 Long term (current) use of non-steroidal anti-inflammatories (NSAID): Secondary | ICD-10-CM | POA: Insufficient documentation

## 2020-04-07 DIAGNOSIS — O3680X0 Pregnancy with inconclusive fetal viability, not applicable or unspecified: Secondary | ICD-10-CM

## 2020-04-07 DIAGNOSIS — M1990 Unspecified osteoarthritis, unspecified site: Secondary | ICD-10-CM | POA: Insufficient documentation

## 2020-04-07 DIAGNOSIS — O99511 Diseases of the respiratory system complicating pregnancy, first trimester: Secondary | ICD-10-CM | POA: Insufficient documentation

## 2020-04-07 DIAGNOSIS — K760 Fatty (change of) liver, not elsewhere classified: Secondary | ICD-10-CM | POA: Insufficient documentation

## 2020-04-07 DIAGNOSIS — Z79899 Other long term (current) drug therapy: Secondary | ICD-10-CM | POA: Insufficient documentation

## 2020-04-07 DIAGNOSIS — R1011 Right upper quadrant pain: Secondary | ICD-10-CM

## 2020-04-07 DIAGNOSIS — K8020 Calculus of gallbladder without cholecystitis without obstruction: Secondary | ICD-10-CM

## 2020-04-07 DIAGNOSIS — O26891 Other specified pregnancy related conditions, first trimester: Secondary | ICD-10-CM | POA: Insufficient documentation

## 2020-04-07 DIAGNOSIS — O26611 Liver and biliary tract disorders in pregnancy, first trimester: Secondary | ICD-10-CM | POA: Insufficient documentation

## 2020-04-08 ENCOUNTER — Other Ambulatory Visit: Payer: Self-pay

## 2020-04-08 ENCOUNTER — Encounter (HOSPITAL_COMMUNITY): Payer: Self-pay | Admitting: Emergency Medicine

## 2020-04-08 ENCOUNTER — Inpatient Hospital Stay (HOSPITAL_COMMUNITY): Payer: Self-pay

## 2020-04-08 DIAGNOSIS — R109 Unspecified abdominal pain: Secondary | ICD-10-CM

## 2020-04-08 DIAGNOSIS — K8020 Calculus of gallbladder without cholecystitis without obstruction: Secondary | ICD-10-CM

## 2020-04-08 DIAGNOSIS — O26891 Other specified pregnancy related conditions, first trimester: Secondary | ICD-10-CM

## 2020-04-08 DIAGNOSIS — O26611 Liver and biliary tract disorders in pregnancy, first trimester: Secondary | ICD-10-CM

## 2020-04-08 DIAGNOSIS — R141 Gas pain: Secondary | ICD-10-CM

## 2020-04-08 DIAGNOSIS — Z3A01 Less than 8 weeks gestation of pregnancy: Secondary | ICD-10-CM

## 2020-04-08 HISTORY — DX: Calculus of gallbladder without cholecystitis without obstruction: K80.20

## 2020-04-08 LAB — COMPREHENSIVE METABOLIC PANEL
ALT: 19 U/L (ref 0–44)
AST: 20 U/L (ref 15–41)
Albumin: 3.6 g/dL (ref 3.5–5.0)
Alkaline Phosphatase: 99 U/L (ref 38–126)
Anion gap: 8 (ref 5–15)
BUN: 9 mg/dL (ref 6–20)
CO2: 21 mmol/L — ABNORMAL LOW (ref 22–32)
Calcium: 8.8 mg/dL — ABNORMAL LOW (ref 8.9–10.3)
Chloride: 106 mmol/L (ref 98–111)
Creatinine, Ser: 0.69 mg/dL (ref 0.44–1.00)
GFR calc Af Amer: >60 mL/min (ref >60)
GFR calc non Af Amer: >60 mL/min (ref >60)
Glucose, Bld: 122 mg/dL — ABNORMAL HIGH (ref 70–99)
Potassium: 3.8 mmol/L (ref 3.5–5.1)
Sodium: 135 mmol/L (ref 135–145)
Total Bilirubin: 0.5 mg/dL (ref 0.3–1.2)
Total Protein: 7.1 g/dL (ref 6.5–8.1)

## 2020-04-08 LAB — GC/CHLAMYDIA PROBE AMP (~~LOC~~) NOT AT ARMC
Chlamydia: NEGATIVE
Comment: NEGATIVE
Comment: NORMAL
Neisseria Gonorrhea: NEGATIVE

## 2020-04-08 LAB — WET PREP, GENITAL
Clue Cells Wet Prep HPF POC: NONE SEEN
Sperm: NONE SEEN
Trich, Wet Prep: NONE SEEN
Yeast Wet Prep HPF POC: NONE SEEN

## 2020-04-08 LAB — CBC
HCT: 34.9 % — ABNORMAL LOW (ref 36.0–46.0)
Hemoglobin: 11.5 g/dL — ABNORMAL LOW (ref 12.0–15.0)
MCH: 29.3 pg (ref 26.0–34.0)
MCHC: 33 g/dL (ref 30.0–36.0)
MCV: 88.8 fL (ref 80.0–100.0)
Platelets: 362 10*3/uL (ref 150–400)
RBC: 3.93 MIL/uL (ref 3.87–5.11)
RDW: 12.4 % (ref 11.5–15.5)
WBC: 8.9 10*3/uL (ref 4.0–10.5)
nRBC: 0 % (ref 0.0–0.2)

## 2020-04-08 LAB — I-STAT BETA HCG BLOOD, ED (MC, WL, AP ONLY): I-stat hCG, quantitative: >2000 m[IU]/mL — ABNORMAL HIGH (ref <5)

## 2020-04-08 LAB — HCG, QUANTITATIVE, PREGNANCY: hCG, Beta Chain, Quant, S: 82479 m[IU]/mL — ABNORMAL HIGH (ref <5)

## 2020-04-08 LAB — URINALYSIS, ROUTINE W REFLEX MICROSCOPIC
Bilirubin Urine: NEGATIVE
Glucose, UA: NEGATIVE mg/dL
Ketones, ur: NEGATIVE mg/dL
Leukocytes,Ua: NEGATIVE
Nitrite: NEGATIVE
Protein, ur: NEGATIVE mg/dL
Specific Gravity, Urine: 1.014 (ref 1.005–1.030)
pH: 6 (ref 5.0–8.0)

## 2020-04-08 LAB — LIPASE, BLOOD: Lipase: 22 U/L (ref 11–51)

## 2020-04-08 MED ORDER — SIMETHICONE 80 MG PO CHEW
80.0000 mg | CHEWABLE_TABLET | Freq: Once | ORAL | Status: AC
  Administered 2020-04-08: 80 mg via ORAL
  Filled 2020-04-08: qty 1

## 2020-04-08 MED ORDER — LACTATED RINGERS IV SOLN: INTRAVENOUS | Status: DC

## 2020-04-08 MED ORDER — HYOSCYAMINE SULFATE 0.125 MG SL SUBL
0.1250 mg | SUBLINGUAL_TABLET | Freq: Once | SUBLINGUAL | Status: AC
  Administered 2020-04-08: 0.125 mg via SUBLINGUAL
  Filled 2020-04-08: qty 1

## 2020-04-08 MED ORDER — SIMETHICONE 80 MG PO CHEW
80.0000 mg | CHEWABLE_TABLET | Freq: Four times a day (QID) | ORAL | 0 refills | Status: DC | PRN
Start: 2020-04-08 — End: 2020-11-03

## 2020-04-08 MED ORDER — SODIUM CHLORIDE 0.9% FLUSH: 3.0000 mL | Freq: Once | INTRAVENOUS | Status: DC

## 2020-04-08 NOTE — ED Triage Notes (Signed)
Patient reports pain across her abdomen onset Sunday and emesis today , denies diarrhea or fever .

## 2020-04-08 NOTE — ED Provider Notes (Signed)
MSE was initiated and I personally evaluated the patient and placed orders (if any) at  1:54 AM on April 08, 2020.  The patient appears stable so that the remainder of the MSE may be completed by another provider.  [name] is a 39 y.o. female G8P5 presents with abd pain, nausea and vomiting. Pt reports generalized and right sided abd pain onset 4 days ago with N/V today.  Pt reports >6 episodes of vomiting today. Denies hematemesis.  LMP June 2021.    Preg test positive today. (now G9P5)  BP (!) 120/90 (BP Location: Left Arm)   Pulse 75   Temp 98.1 F (36.7 C) (Oral)   Resp 22   Ht 5\' 6"  (1.676 m)   Wt 90 kg   SpO2 100%   BMI 32.02 kg/m   Face to face Exam:   General: Awake, vomiting.   HEENT: Atraumatic  Resp: Normal effort  Abd: tender to palpation - generalized   Labs below reviewed.  No elevation in AST/ALT or lipase to suggest cholecystitis.  UA without evidence of infection.  Pt will be transferred to [facility] for further evaluation and work-up.  2:02 AM Discussed with [facility]U APP - - who will accept in transfer.    Abdominal pain during pregnancy, antepartum  Nausea and vomiting in pregnancy      Results for orders placed or performed during the hospital encounter of 04/07/20  Lipase, blood  Result Value Ref Range   Lipase 22 11 - 51 U/L  Comprehensive metabolic panel  Result Value Ref Range   Sodium 135 135 - 145 mmol/L   Potassium 3.8 3.5 - 5.1 mmol/L   Chloride 106 98 - 111 mmol/L   CO2 21 (L) 22 - 32 mmol/L   Glucose, Bld 122 (H) 70 - 99 mg/dL   BUN 9 6 - 20 mg/dL   Creatinine, Ser 04/09/20 0.44 - 1.00 mg/dL   Calcium 8.8 (L) 8.9 - 10.3 mg/dL   Total Protein 7.1 6.5 - 8.1 g/dL   Albumin 3.6 3.5 - 5.0 g/dL   AST 20 15 - 41 U/L   ALT 19 0 - 44 U/L   Alkaline Phosphatase 99 38 - 126 U/L   Total Bilirubin 0.5 0.3 - 1.2 mg/dL   GFR calc non Af Amer >60 >60 mL/min   GFR calc Af Amer >60 >60 mL/min   Anion gap 8 5 - 15  CBC  Result Value Ref Range   WBC  8.9 4.0 - 10.5 K/uL   RBC 3.93 3.87 - 5.11 MIL/uL   Hemoglobin 11.5 (L) 12.0 - 15.0 g/dL   HCT 5.09 (L) 36 - 46 %   MCV 88.8 80.0 - 100.0 fL   MCH 29.3 26.0 - 34.0 pg   MCHC 33.0 30.0 - 36.0 g/dL   RDW 32.6 71.2 - 45.8 %   Platelets 362 150 - 400 K/uL   nRBC 0.0 0.0 - 0.2 %  Urinalysis, Routine w reflex microscopic  Result Value Ref Range   Color, Urine YELLOW YELLOW   APPearance HAZY (A) CLEAR   Specific Gravity, Urine 1.014 1.005 - 1.030   pH 6.0 5.0 - 8.0   Glucose, UA NEGATIVE NEGATIVE mg/dL   Hgb urine dipstick SMALL (A) NEGATIVE   Bilirubin Urine NEGATIVE NEGATIVE   Ketones, ur NEGATIVE NEGATIVE mg/dL   Protein, ur NEGATIVE NEGATIVE mg/dL   Nitrite NEGATIVE NEGATIVE   Leukocytes,Ua NEGATIVE NEGATIVE   RBC / HPF 0-5 0 - 5 RBC/hpf  WBC, UA 0-5 0 - 5 WBC/hpf   Bacteria, UA RARE (A) NONE SEEN   Squamous Epithelial / LPF 11-20 0 - 5   Mucus PRESENT   I-Stat beta hCG blood, ED  Result Value Ref Range   I-stat hCG, quantitative >2,000.0 (H) <5 mIU/mL   Comment 3           No results found.     Arielys Wandersee, Boyd Kerbs 04/08/20 0205    Gilda Crease, MD 04/08/20 [phone]

## 2020-04-08 NOTE — MAU Provider Note (Signed)
Chief Complaint: Abdominal Pain   First Provider Initiated Contact with Patient 04/08/20 0215        SUBJECTIVE HPI: [name] is a 39 y.o. [id number] at Unknown by LMP who presents to maternity admissions reporting generalized abdominal pain, with most of it being periumbilical, nausea, and vomiting.  Was diagnosed with Gallstones in 2020 but never had a surgery consult . She denies vaginal bleeding, vaginal itching/burning, urinary symptoms, h/a, dizziness, or fever/chills.    Abdominal Pain This is a new problem. The current episode started in the past 7 days. The problem has been unchanged. The pain is located in the generalized abdominal region and periumbilical region. The quality of the pain is cramping and colicky. The abdominal pain does not radiate. Associated symptoms include nausea and vomiting. Pertinent negatives include no diarrhea, dysuria, fever, frequency or myalgias. Nothing aggravates the pain. The pain is relieved by nothing. She has tried acetaminophen (ibuprofen) for the symptoms. The treatment provided no relief. Her past medical history is significant for gallstones.   RN Note: [name] is a 39 y.o. female G8P5 presents with abd pain, nausea and vomiting. Pt reports generalized and right sided abd pain onset 4 days ago with N/V today.  Pt reports >6 episodes of vomiting today. Denies hematemesis.  LMP June 2021.    Past Medical History:  Diagnosis Date  . Anxiety    relative to circumstances to back problems/injury, not taking any medicine, pt. reports has been crying a lot  . Arthritis    low back  . Asthma   . Depression   . GERD (gastroesophageal reflux disease)   . Migraines   . Neck pain    Past Surgical History:  Procedure Laterality Date  . childbirth     x4 vaginal   . LUMBAR LAMINECTOMY/DECOMPRESSION MICRODISCECTOMY  06/05/2012   Procedure: LUMBAR LAMINECTOMY/DECOMPRESSION MICRODISCECTOMY;  Surgeon: Emilee Hero, MD;  Location: [facility] OR;   Service: Orthopedics;  Laterality: Left;  Left sided lumbar 4-5 microdisectomy   Social History   Socioeconomic History  . Marital status: Single    Spouse name: Not on file  . Number of children: Not on file  . Years of education: Not on file  . Highest education level: Not on file  Occupational History  . Not on file  Tobacco Use  . Smoking status: Never Smoker  . Smokeless tobacco: Never Used  Vaping Use  . Vaping Use: Never used  Substance and Sexual Activity  . Alcohol use: No  . Drug use: No  . Sexual activity: Not Currently    Birth control/protection: None, Injection  Other Topics Concern  . Not on file  Social History Narrative  . Not on file   Social Determinants of Health   Financial Resource Strain:   . Difficulty of Paying Living Expenses:   Food Insecurity:   . Worried About Programme researcher, broadcasting/film/video in the Last Year:   . Barista in the Last Year:   Transportation Needs:   . Freight forwarder (Medical):   Marland Kitchen Lack of Transportation (Non-Medical):   Physical Activity:   . Days of Exercise per Week:   . Minutes of Exercise per Session:   Stress:   . Feeling of Stress :   Social Connections:   . Frequency of Communication with Friends and Family:   . Frequency of Social Gatherings with Friends and Family:   . Attends Religious Services:   . Active Member of Clubs or  Organizations:   . Attends Banker Meetings:   Marland Kitchen Marital Status:   Intimate Partner Violence:   . Fear of Current or Ex-Partner:   . Emotionally Abused:   Marland Kitchen Physically Abused:   . Sexually Abused:    No current facility-administered medications on file prior to encounter.   Current Outpatient Medications on File Prior to Encounter  Medication Sig Dispense Refill  . dicyclomine (BENTYL) 20 MG tablet Take 1 tablet (20 mg total) by mouth every 8 (eight) hours as needed for spasms (Abdominal cramping). 15 tablet 0  . ibuprofen (ADVIL) 800 MG tablet Take 1 tablet (800 mg  total) by mouth every 8 (eight) hours as needed for mild pain. 30 tablet 0  . loratadine (CLARITIN) 10 MG tablet Take 10 mg by mouth daily.    . ondansetron (ZOFRAN ODT) 4 MG disintegrating tablet Take 1 tablet (4 mg total) by mouth every 6 (six) hours as needed. 20 tablet 0  . predniSONE (DELTASONE) 20 MG tablet 3 tabs po day one, then 2 tabs daily x 4 days 11 tablet 0   Allergies  Allergen Reactions  . Hydrocodone-Acetaminophen Itching    Has some itching on face & nose but not bad enough to stop taking it/ MD aware and instructed patient to take anti-allergy medication prn.    I have reviewed patient's Past Medical Hx, Surgical Hx, Family Hx, Social Hx, medications and allergies.   ROS:  Review of Systems  Constitutional: Negative for fever.  Gastrointestinal: Positive for abdominal pain, nausea and vomiting. Negative for diarrhea.  Genitourinary: Negative for dysuria and frequency.  Musculoskeletal: Negative for myalgias.   Review of Systems  Other systems negative   Physical Exam  Physical Exam Patient Vitals for the past 24 hrs:  BP Temp Temp src Pulse Resp SpO2 Height Weight  04/08/20 0006 -- -- -- -- -- -- 5\' 6"  (1.676 m) 90 kg  04/08/20 0002 (!) 120/90 98.1 F (36.7 C) Oral 75 22 100 % -- --   Constitutional: Well-developed, well-nourished female in no acute distress.  Cardiovascular: normal rate Respiratory: normal effort GI: Abd soft, with generalized tenderness  + RUQ tenderness.  Pos BS x 4 MS: Extremities nontender, no edema, normal ROM Neurologic: Alert and oriented x 4.  GU: Neg CVAT.  PELVIC EXAM: Bimanual exam: Cervix 0/long/high, firm, anterior, neg CMT, uterus nontender, adnexa without tenderness, enlargement, or mass  LAB RESULTS Results for orders placed or performed during the hospital encounter of 04/07/20 (from the past 24 hour(s))  Urinalysis, Routine w reflex microscopic     Status: Abnormal   Collection Time: 04/08/20 12:10 AM  Result Value  Ref Range   Color, Urine YELLOW YELLOW   APPearance HAZY (A) CLEAR   Specific Gravity, Urine 1.014 1.005 - 1.030   pH 6.0 5.0 - 8.0   Glucose, UA NEGATIVE NEGATIVE mg/dL   Hgb urine dipstick SMALL (A) NEGATIVE   Bilirubin Urine NEGATIVE NEGATIVE   Ketones, ur NEGATIVE NEGATIVE mg/dL   Protein, ur NEGATIVE NEGATIVE mg/dL   Nitrite NEGATIVE NEGATIVE   Leukocytes,Ua NEGATIVE NEGATIVE   RBC / HPF 0-5 0 - 5 RBC/hpf   WBC, UA 0-5 0 - 5 WBC/hpf   Bacteria, UA RARE (A) NONE SEEN   Squamous Epithelial / LPF 11-20 0 - 5   Mucus PRESENT   I-Stat beta hCG blood, ED     Status: Abnormal   Collection Time: 04/08/20 12:16 AM  Result Value Ref Range   I-stat  hCG, quantitative >2,000.0 (H) <5 mIU/mL   Comment 3          Lipase, blood     Status: None   Collection Time: 04/08/20 12:20 AM  Result Value Ref Range   Lipase 22 11 - 51 U/L  Comprehensive metabolic panel     Status: Abnormal   Collection Time: 04/08/20 12:20 AM  Result Value Ref Range   Sodium 135 135 - 145 mmol/L   Potassium 3.8 3.5 - 5.1 mmol/L   Chloride 106 98 - 111 mmol/L   CO2 21 (L) 22 - 32 mmol/L   Glucose, Bld 122 (H) 70 - 99 mg/dL   BUN 9 6 - 20 mg/dL   Creatinine, Ser 1.69 0.44 - 1.00 mg/dL   Calcium 8.8 (L) 8.9 - 10.3 mg/dL   Total Protein 7.1 6.5 - 8.1 g/dL   Albumin 3.6 3.5 - 5.0 g/dL   AST 20 15 - 41 U/L   ALT 19 0 - 44 U/L   Alkaline Phosphatase 99 38 - 126 U/L   Total Bilirubin 0.5 0.3 - 1.2 mg/dL   GFR calc non Af Amer >60 >60 mL/min   GFR calc Af Amer >60 >60 mL/min   Anion gap 8 5 - 15  CBC     Status: Abnormal   Collection Time: 04/08/20 12:20 AM  Result Value Ref Range   WBC 8.9 4.0 - 10.5 K/uL   RBC 3.93 3.87 - 5.11 MIL/uL   Hemoglobin 11.5 (L) 12.0 - 15.0 g/dL   HCT 67.8 (L) 36 - 46 %   MCV 88.8 80.0 - 100.0 fL   MCH 29.3 26.0 - 34.0 pg   MCHC 33.0 30.0 - 36.0 g/dL   RDW 93.8 10.1 - 75.1 %   Platelets 362 150 - 400 K/uL   nRBC 0.0 0.0 - 0.2 %       IMAGING US OB Comp Less 14  Wks  Result Date: 04/08/2020 CLINICAL DATA:  Right lower back pain EXAM: OBSTETRIC <14 WK ULTRASOUND TECHNIQUE: Transabdominal ultrasound was performed for evaluation of the gestation as well as the maternal uterus and adnexal regions. COMPARISON:  None. FINDINGS: Intrauterine gestational sac: Single Yolk sac:  Visualized. Embryo:  Visualized. Cardiac Activity: Visualized Heart Rate: 132 bpm CRL:   8.5 mm   6 w 5 d                  [place] EDC: 11/27/2020 Subchorionic hemorrhage:  None visualized. Maternal uterus/adnexae: The right ovary is not well seen. The left ovary is normal in appearance. IMPRESSION: Single live intrauterine pregnancy measuring 6 weeks 5 days Electronically Signed   By: Jonna Clark M.D.   On: 04/08/2020 03:37   US ABDOMEN LIMITED RUQ  Result Date: 04/08/2020 CLINICAL DATA:  Abdominal pain EXAM: ULTRASOUND ABDOMEN LIMITED RIGHT UPPER QUADRANT COMPARISON:  None. FINDINGS: Gallbladder: Cervical layering calcified gallstones are present the largest measuring 1.2 cm. No gallbladder wall thickening. No sonographic Murphy sign noted by sonographer. Common bile duct: Normal in appearance Liver: Increased echotexture seen throughout. No focal abnormality or biliary ductal dilatation. Portal vein is patent on color Doppler imaging with normal direction of blood flow towards the liver. Other: None. IMPRESSION: Cholelithiasis without evidence of acute cholecystitis. Hepatic steatosis Electronically Signed   By: Jonna Clark M.D.   On: 04/08/2020 03:38     MAU Management/MDM: Ordered usual first trimester r/o ectopic labs.   Pelvic exam and cultures done Will check baseline Ultrasound to rule  out ectopic. Also ordered a RUQ US to evaluate for choKorealecystitis, given known presence of stones  This bleeding/pain can represent a normal pregnancy with bleeding, spontaneous abortion or even an ectopic which can be life-threatening.  The process as listed above helps to determine which of these is  present.  Reviwed results Pelvic US showed viable single IUP, live No ectopic or adnexal mass Cholelithiasis seen as before but no wall thickening or obstruction Suspect this may be GI colic, so gave Levsin and simethicone with significant improvement in pain   ASSESSMENT 1. Abdominal pain during pregnancy, antepartum   2. Nausea and vomiting in pregnancy   3.      Cholelithiasis 4.      Live Single IUP at [id number]   PLAN Discharge home Encouraged to start prenatal care May need surgery consult if has recurrent episodes Low fat diet Rx simethicone for colic  Pt stable at time of discharge. Encouraged to return here or to other Urgent Care/ED if she develops worsening of symptoms, increase in pain, fever, or other concerning symptoms.    Wynelle BourgeoisMarie Ericia Moxley CNM, MSN Certified Nurse-Midwife 04/08/2020  2:15 AM

## 2020-04-08 NOTE — Discharge Instructions (Signed)
Cholelithiasis  Cholelithiasis is a form of gallbladder disease in which gallstones form in the gallbladder. The gallbladder is an organ that stores bile. Bile is made in the liver, and it helps to digest fats. Gallstones begin as small crystals and slowly grow into stones. They may cause no symptoms until the gallbladder tightens (contracts) and a gallstone is blocking the duct (gallbladder attack), which can cause pain. Cholelithiasis is also referred to as gallstones. There are two main types of gallstones:  Cholesterol stones. These are made of hardened cholesterol and are usually yellow-green in color. They are the most common type of gallstone. Cholesterol is a white, waxy, fat-like substance that is made in the liver.  Pigment stones. These are dark in color and are made of a red-yellow substance that forms when hemoglobin from red blood cells breaks down (bilirubin). What are the causes? This condition may be caused by an imbalance in the substances that bile is made of. This can happen if the bile:  Has too much bilirubin.  Has too much cholesterol.  Does not have enough bile salts. These salts help the body absorb and digest fats. In some cases, this condition can also be caused by the gallbladder not emptying completely or often enough. What increases the risk? The following factors may make you more likely to develop this condition:  Being female.  Having multiple pregnancies. Health care providers sometimes advise removing diseased gallbladders before future pregnancies.  Eating a diet that is heavy in fried foods, fat, and refined carbohydrates, like white bread and white rice.  Being obese.  Being older than age 40.  Prolonged use of medicines that contain female hormones (estrogen).  Having diabetes mellitus.  Rapidly losing weight.  Having a family history of gallstones.  Being of American Indian or [place] descent.  Having an intestinal disease such as Crohn  disease.  Having metabolic syndrome.  Having cirrhosis.  Having severe types of anemia such as sickle cell anemia. What are the signs or symptoms? In most cases, there are no symptoms. These are known as silent gallstones. If a gallstone blocks the bile ducts, it can cause a gallbladder attack. The main symptom of a gallbladder attack is sudden pain in the upper right abdomen. The pain usually comes at night or after eating a large meal. The pain can last for one or several hours and can spread to the right shoulder or chest. If the bile duct is blocked for more than a few hours, it can cause infection or inflammation of the gallbladder, liver, or pancreas, which may cause:  Nausea.  Vomiting.  Abdominal pain that lasts for 5 hours or more.  Fever or chills.  Yellowing of the skin or the whites of the eyes (jaundice).  Dark urine.  Light-colored stools. How is this diagnosed? This condition may be diagnosed based on:  A physical exam.  Your medical history.  An ultrasound of your gallbladder.  CT scan.  MRI.  Blood tests to check for signs of infection or inflammation.  A scan of your gallbladder and bile ducts (biliary system) using nonharmful radioactive material and special cameras that can see the radioactive material (cholescintigram). This test checks to see how your gallbladder contracts and whether bile ducts are blocked.  Inserting a small tube with a camera on the end (endoscope) through your mouth to inspect bile ducts and check for blockages (endoscopic retrograde cholangiopancreatogram). How is this treated? Treatment for gallstones depends on the severity of the condition.   Silent gallstones do not need treatment. If the gallstones cause a gallbladder attack or other symptoms, treatment may be required. Options for treatment include:  Surgery to remove the gallbladder (cholecystectomy). This is the most common treatment.  Medicines to dissolve gallstones.  These are most effective at treating small gallstones. You may need to take medicines for up to 6-12 months.  Shock wave treatment (extracorporeal biliary lithotripsy). In this treatment, an ultrasound machine sends shock waves to the gallbladder to break gallstones into smaller pieces. These pieces can then be passed into the intestines or be dissolved by medicine. This is rarely used.  Removing gallstones through endoscopic retrograde cholangiopancreatogram. A small basket can be attached to the endoscope and used to capture and remove gallstones. Follow these instructions at home:  Take over-the-counter and prescription medicines only as told by your health care provider.  Maintain a healthy weight and follow a healthy diet. This includes: ? Reducing fatty foods, such as fried food. ? Reducing refined carbohydrates, like white bread and white rice. ? Increasing fiber. Aim for foods like almonds, fruit, and beans.  Keep all follow-up visits as told by your health care provider. This is important. Contact a health care provider if:  You think you have had a gallbladder attack.  You have been diagnosed with silent gallstones and you develop abdominal pain or indigestion. Get help right away if:  You have pain from a gallbladder attack that lasts for more than 2 hours.  You have abdominal pain that lasts for more than 5 hours.  You have a fever or chills.  You have persistent nausea and vomiting.  You develop jaundice.  You have dark urine or light-colored stools. Summary  Cholelithiasis (also called gallstones) is a form of gallbladder disease in which gallstones form in the gallbladder.  This condition is caused by an imbalance in the substances that make up bile. This can happen if the bile has too much cholesterol, too much bilirubin, or not enough bile salts.  You are more likely to develop this condition if you are female, pregnant, using medicines with estrogen, obese,  older than age 36, or have a family history of gallstones. You may also develop gallstones if you have diabetes, an intestinal disease, cirrhosis, or metabolic syndrome.  Treatment for gallstones depends on the severity of the condition. Silent gallstones do not need treatment.  If gallstones cause a gallbladder attack or other symptoms, treatment may be needed. The most common treatment is surgery to remove the gallbladder. This information is not intended to replace advice given to you by your health care provider. Make sure you discuss any questions you have with your health care provider. Document Revised: 08/09/2017 Document Reviewed: 05/13/2016 Elsevier Patient Education  2020 [employer] Abdominal Bloating When you have abdominal bloating, your abdomen may feel full, tight, or painful. It may also look bigger than normal or swollen (distended). Common causes of abdominal bloating include:  Swallowing air.  Constipation.  Problems digesting food.  Eating too much.  Irritable bowel syndrome. This is a condition that affects the large intestine.  Lactose intolerance. This is an inability to digest lactose, a natural sugar in dairy products.  Celiac disease. This is a condition that affects the ability to digest gluten, a protein found in some grains.  Gastroparesis. This is a condition that slows down the movement of food in the stomach and small intestine. It is more common in people with diabetes mellitus.  Gastroesophageal reflux disease (GERD). This  is a digestive condition that makes stomach acid flow back into the esophagus.  Urinary retention. This means that the body is holding onto urine, and the bladder cannot be emptied all the way. Follow these instructions at home: Eating and drinking  Avoid eating too much.  Try not to swallow air while talking or eating.  Avoid eating while lying down.  Avoid these foods and drinks: ? Foods that cause gas, such as  broccoli, cabbage, cauliflower, and baked beans. ? Carbonated drinks. ? Hard candy. ? Chewing gum. Medicines  Take over-the-counter and prescription medicines only as told by your health care provider.  Take probiotic medicines. These medicines contain live bacteria or yeasts that can help digestion.  Take coated peppermint oil capsules. Activity  Try to exercise regularly. Exercise may help to relieve bloating that is caused by gas and relieve constipation. General instructions  Keep all follow-up visits as told by your health care provider. This is important. Contact a health care provider if:  You have nausea and vomiting.  You have diarrhea.  You have abdominal pain.  You have unusual weight loss or weight gain.  You have severe pain, and medicines do not help. Get help right away if:  You have severe chest pain.  You have trouble breathing.  You have shortness of breath.  You have trouble urinating.  You have darker urine than normal.  You have blood in your stools or have dark, tarry stools. Summary  Abdominal bloating means that the abdomen is swollen.  Common causes of abdominal bloating are swallowing air, constipation, and problems digesting food.  Avoid eating too much and avoid swallowing air.  Avoid foods that cause gas, carbonated drinks, hard candy, and chewing gum. This information is not intended to replace advice given to you by your health care provider. Make sure you discuss any questions you have with your health care provider. Document Revised: 12/15/2018 Document Reviewed: 09/28/2016 Elsevier Patient Education  2020 [employer].

## 2020-04-08 NOTE — MAU Note (Signed)
Pt reports to MAU c/o pain on her right side of her abdomen that radiates to her back. Pt reports the pain in her abdomen is a 10/10. Pt reports spotting on Sunday none since. Pt reports vomiting x 10 times today.

## 2020-07-13 ENCOUNTER — Other Ambulatory Visit: Payer: Self-pay | Admitting: Nurse Practitioner

## 2020-07-13 DIAGNOSIS — Z363 Encounter for antenatal screening for malformations: Secondary | ICD-10-CM

## 2020-07-13 DIAGNOSIS — O09522 Supervision of elderly multigravida, second trimester: Secondary | ICD-10-CM

## 2020-07-13 DIAGNOSIS — Z3A22 22 weeks gestation of pregnancy: Secondary | ICD-10-CM

## 2020-07-13 LAB — OB RESULTS CONSOLE GC/CHLAMYDIA
Chlamydia: NEGATIVE
Gonorrhea: NEGATIVE

## 2020-07-13 LAB — OB RESULTS CONSOLE ABO/RH: RH Type: POSITIVE

## 2020-07-13 LAB — HEPATITIS C ANTIBODY: HCV Ab: NEGATIVE

## 2020-07-13 LAB — OB RESULTS CONSOLE RUBELLA ANTIBODY, IGM: Rubella: IMMUNE

## 2020-07-13 LAB — OB RESULTS CONSOLE HGB/HCT, BLOOD
HCT: 30 (ref 29–41)
Hemoglobin: 10.2

## 2020-07-13 LAB — OB RESULTS CONSOLE HEPATITIS B SURFACE ANTIGEN: Hepatitis B Surface Ag: NEGATIVE

## 2020-07-13 LAB — OB RESULTS CONSOLE HIV ANTIBODY (ROUTINE TESTING): HIV: NONREACTIVE

## 2020-07-13 LAB — OB RESULTS CONSOLE RPR: RPR: NONREACTIVE

## 2020-07-13 LAB — OB RESULTS CONSOLE PLATELET COUNT: Platelets: 339

## 2020-07-13 LAB — GLUCOSE, 1 HOUR GESTATIONAL: Glucose 1 Hour: 128

## 2020-07-13 LAB — OB RESULTS CONSOLE VARICELLA ZOSTER ANTIBODY, IGG: Varicella: NON-IMMUNE/NOT IMMUNE

## 2020-07-15 LAB — CYTOLOGY - PAP: Pap: NEGATIVE

## 2020-07-22 ENCOUNTER — Other Ambulatory Visit: Payer: Self-pay

## 2020-07-25 ENCOUNTER — Ambulatory Visit: Payer: Self-pay

## 2020-07-25 ENCOUNTER — Ambulatory Visit: Payer: Self-pay | Attending: Nurse Practitioner

## 2020-07-25 ENCOUNTER — Other Ambulatory Visit: Payer: Self-pay | Admitting: *Deleted

## 2020-07-25 ENCOUNTER — Ambulatory Visit: Payer: Self-pay | Admitting: *Deleted

## 2020-07-25 ENCOUNTER — Encounter: Payer: Self-pay | Admitting: *Deleted

## 2020-07-25 ENCOUNTER — Other Ambulatory Visit: Payer: Self-pay

## 2020-07-25 ENCOUNTER — Ambulatory Visit (HOSPITAL_BASED_OUTPATIENT_CLINIC_OR_DEPARTMENT_OTHER): Payer: Self-pay | Admitting: Genetic Counselor

## 2020-07-25 VITALS — BP 126/73 | HR 83 | Ht 60.5 in

## 2020-07-25 DIAGNOSIS — O09522 Supervision of elderly multigravida, second trimester: Secondary | ICD-10-CM

## 2020-07-25 DIAGNOSIS — O0930 Supervision of pregnancy with insufficient antenatal care, unspecified trimester: Secondary | ICD-10-CM

## 2020-07-25 DIAGNOSIS — Z31430 Encounter of female for testing for genetic disease carrier status for procreative management: Secondary | ICD-10-CM

## 2020-07-25 DIAGNOSIS — Z3A22 22 weeks gestation of pregnancy: Secondary | ICD-10-CM | POA: Insufficient documentation

## 2020-07-25 DIAGNOSIS — Z363 Encounter for antenatal screening for malformations: Secondary | ICD-10-CM | POA: Insufficient documentation

## 2020-07-25 DIAGNOSIS — Z315 Encounter for genetic counseling: Secondary | ICD-10-CM

## 2020-07-25 DIAGNOSIS — Z36 Encounter for antenatal screening for chromosomal anomalies: Secondary | ICD-10-CM

## 2020-07-25 NOTE — Progress Notes (Signed)
07/25/2020  Yariana Hoaglund [birth year] MRN: [id number] DOV: 07/25/2020  [name] presented to the [facility] for [facility]aternal [facility] for a genetics consultation regarding advanced maternal age. [name] was presented to her appointment alone. This session was facilitated by a [facility] Spanish interpreter.   Indication for genetic counseling - Advanced maternal age   Prenatal history  [name] is a [id number], 39 y.o. female. Her current pregnancy has completed [redacted]w[redacted]d (Estimated Date of Delivery: 11/28/20). [name] has two sons and two daughters with prior partners. She and her current partner have a son together. She has also had an ectopic pregnancy with her current partner. The current pregnancy is the third for this couple. Per records, [name] has had two elective terminations. However, she did not disclose these pregnancies to [place] today.  [name] denied exposure to environmental toxins or chemical agents. She denied the use of alcohol, tobacco or street drugs. She reported taking prenatal vitamins. She denied significant viral illnesses, fevers, and bleeding during the course of her pregnancy. Her medical and surgical histories were noncontributory.  Family History  A three generation pedigree was drafted and reviewed. Both family histories were reviewed and found to be noncontributory for birth defects, intellectual disability, recurrent pregnancy loss, and known genetic conditions. [name] had limited information about her partner's family history; thus, risk assessment was limited.   The patient's ancestry is [place]. The father of the pregnancy's ancestry is [place]. Ashkenazi Jewish ancestry and consanguinity were denied. Pedigree will be scanned under Media.  Discussion  Advanced maternal age:  [name] was referred to genetic counseling for advanced maternal age, as she will be 39 years old at the time of delivery. We discussed that as a woman ages, the risk for  certain chromosomal conditions, such as trisomy 75 (Down syndrome), trisomy 81, and trisomy 18 increases. These conditions often are not inherited, but instead occur due to an error in chromosomal division during the formation of sperm and egg cells in a process called nondisjunction. At her age and during the second trimester, [name] has approximately a 1 in 56 (1.8%) chance of having a child with a chromosomal abnormality. Her age-related risk to have a child with Down syndrome specifically is 1 in 99 (1.0%) in the second trimester. We briefly reviewed features associated with Down syndrome, trisomy 41, and trisomy 23.    We reviewed noninvasive prenatal screening (NIPS) as an available screening option for chromosomal aneuploidies. Specifically, we discussed that NIPS analyzes cell free DNA originating from the placenta that is found in the maternal blood circulation during pregnancy. This test is not diagnostic for chromosome conditions, but can provide information regarding the presence or absence of extra fetal DNA for chromosomes 13, 18 21, and the sex chromosomes. Thus, it would not identify or rule out all fetal aneuploidy. The reported detection rate is 91-99% for trisomies 21, 18, 13, and sex chromosome aneuploidies. The false positive rate is reported to be less than 0.1% for any of these conditions. [name] indicated that she is interested in pursuing NIPS.    Ultrasound:  A complete ultrasound was performed today prior to our visit. The ultrasound report will be sent under separate cover. There were no visualized fetal anomalies or markers suggestive of aneuploidy. [name] was counseled that ~50% of fetuses with Down syndrome and 90-95% of fetuses with trisomy 15 or trisomy 9 demonstrate a sign of the respective conditions on anatomy ultrasound.  Carrier screening:  Per ACOG recommendation, carrier screening for hemoglobinopathies, cystic fibrosis (CF) and spinal muscular atrophy  (SMA) was discussed including information about the conditions, rationale for testing, autosomal recessive inheritance, and the option of prenatal diagnosis. I offered carrier screening for these conditions, which [name] accepted at this time. [name] was informed that select hemoglobinopathies, CF, and SMA are included on [place]'s newborn screen. [name] understands that carrier screening would be recommended for her partner should her results be positive for a recessive condition.  Diagnostic testing:  [name] was also counseled regarding diagnostic testing via amniocentesis. We discussed the technical aspects of the procedure and quoted up to a 1 in 500 (0.2%) risk for spontaneous pregnancy loss or other adverse pregnancy outcomes as a result of amniocentesis. Cultured cells from an amniocentesis sample allow for the visualization of a fetal karyotype, which can detect >99% of large chromosomal aberrations. Chromosomal microarray can also be performed to identify smaller deletions or duplications of fetal chromosomal material. After careful consideration, [name] declined amniocentesis at this time. She understands that amniocentesis is available at any point after 16 weeks of pregnancy and that she may opt to undergo the procedure at a later date should she change her mind.  Plan:  [name] had her blood drawn for [facility] NIPS and Core Carrier Screening today. Her sample was sent to the laboratory [facility] as she qualifies for free testing through [facility]'s Patient Assistance Program. Results from NIPS will take approximately one week to be returned. Results from carrier screening will take 2-3 weeks to be returned. I will call [name] once her results become available.   I counseled [name] regarding the above risks and available options. Second year [employer] genetic counseling student Jacklynn Lewis participated in portions of this session under my supervision. The approximate  face-to-face time with the genetic counselor was 50 minutes.  In summary:  Discussed advanced maternal age and options for follow-up testing  1 in 67 (1.8%) chance of having child with chromosomal aneuploidy  Had sample drawn for [facility] NIPS. We will follow results  Reviewed results of ultrasound  No fetal anomalies or markers seen  Reduction in risk for fetal aneuploidy  Discussed carrier screening for cystic fibrosis, spinal muscular atrophy, and hemoglobinopathies  Had sample drawn for [facility]'s Core Carrier Screening. We will follow results  Offered additional testing and screening  Declined amniocentesis  Reviewed family history concerns   Gershon Crane, MS, Aeronautical engineer

## 2020-07-27 ENCOUNTER — Other Ambulatory Visit: Payer: Self-pay

## 2020-08-01 ENCOUNTER — Telehealth: Payer: Self-pay | Admitting: Genetic Counselor

## 2020-08-01 NOTE — Telephone Encounter (Signed)
Second year [employer] genetic counseling student [name] called [name] with the help of [place] Interpreter ID# [phone] to discuss her negative noninvasive prenatal screening (NIPS) results. LVM for [name] requesting a call back to my direct line to discuss these in more detail, as no identifiers were provided in voicemail message.   Gershon Crane, MS, [facility] Genetic Counselor

## 2020-08-01 NOTE — Telephone Encounter (Signed)
Received a call back from [name] to discuss her results with the help of [address] [place], [place] [id number]. Specifically, [name] had NIPS through the laboratory [facility]. Testing was offered because of advanced maternal age. These negative results demonstrated an expected representation of chromosome 21, 18, 13, and sex chromosome material, greatly reducing the likelihood of trisomies 57, 31, or 24 and sex chromosome aneuploidies for the pregnancy. Expected fetal sex was confirmed to be female.  NIPS analyzes placental (fetal) DNA in maternal circulation. NIPS is considered to be highly specific and sensitive, but is not considered to be a diagnostic test. We reviewed that this testing identifies 91-99% of pregnancies with trisomies 68, 18, and 41, as well as sex chromosome abnormalities, but does not test for all genetic conditions. [name] was reminded that diagnostic testing via amniocentesis is available should she be interested in confirming this result. She confirmed that she had no questions about these results at this time.  Gershon Crane, MS, [facility] Genetic Counselor

## 2020-08-15 ENCOUNTER — Telehealth: Payer: Self-pay | Admitting: Genetic Counselor

## 2020-08-15 NOTE — Telephone Encounter (Signed)
LVM for [name] with the help of [address] [place], [place] [id number] re: good news about the last part of her results (carrier screening results). Requested a call back to my direct line to discuss these in more detail, as no identifiers were provided in voicemail message.   Gershon Crane, MS, [facility] Genetic Counselor

## 2020-08-22 ENCOUNTER — Ambulatory Visit: Payer: Self-pay | Attending: Obstetrics and Gynecology

## 2020-08-22 ENCOUNTER — Other Ambulatory Visit: Payer: Self-pay | Admitting: *Deleted

## 2020-08-22 ENCOUNTER — Ambulatory Visit: Payer: Self-pay | Admitting: *Deleted

## 2020-08-22 ENCOUNTER — Other Ambulatory Visit: Payer: Self-pay

## 2020-08-22 ENCOUNTER — Encounter: Payer: Self-pay | Admitting: *Deleted

## 2020-08-22 VITALS — BP 106/69 | HR 101

## 2020-08-22 DIAGNOSIS — O0932 Supervision of pregnancy with insufficient antenatal care, second trimester: Secondary | ICD-10-CM

## 2020-08-22 DIAGNOSIS — O0930 Supervision of pregnancy with insufficient antenatal care, unspecified trimester: Secondary | ICD-10-CM | POA: Insufficient documentation

## 2020-08-22 DIAGNOSIS — Z3A26 26 weeks gestation of pregnancy: Secondary | ICD-10-CM

## 2020-08-22 DIAGNOSIS — O09522 Supervision of elderly multigravida, second trimester: Secondary | ICD-10-CM

## 2020-08-22 DIAGNOSIS — O99212 Obesity complicating pregnancy, second trimester: Secondary | ICD-10-CM

## 2020-08-24 ENCOUNTER — Telehealth: Payer: Self-pay | Admitting: Genetic Counselor

## 2020-08-24 NOTE — Telephone Encounter (Signed)
Attempted to call Ms. [name] again with the help of [address] [place], [place] [id number] to discuss her negative carrier screening results. Call went to voicemail again, so I left message informing her that I was calling with good news about the rest of her testing results from her appointment with me. Requested a call back to my direct line to discuss these in more detail, as no identifiers were provided in voicemail message.   Gershon Crane, MS, [facility] Genetic Counselor

## 2020-08-25 ENCOUNTER — Other Ambulatory Visit: Payer: Self-pay

## 2020-08-25 ENCOUNTER — Encounter (HOSPITAL_COMMUNITY): Payer: Self-pay

## 2020-08-25 ENCOUNTER — Emergency Department (HOSPITAL_COMMUNITY): Payer: Self-pay

## 2020-08-25 ENCOUNTER — Inpatient Hospital Stay (HOSPITAL_COMMUNITY)
Admission: EM | Admit: 2020-08-25 | Discharge: 2020-08-26 | Disposition: A | Discharge status: Home / Self Care | Payer: Self-pay | Attending: Obstetrics & Gynecology | Admitting: Obstetrics & Gynecology

## 2020-08-25 DIAGNOSIS — J45901 Unspecified asthma with (acute) exacerbation: Secondary | ICD-10-CM | POA: Insufficient documentation

## 2020-08-25 DIAGNOSIS — J069 Acute upper respiratory infection, unspecified: Secondary | ICD-10-CM | POA: Insufficient documentation

## 2020-08-25 DIAGNOSIS — Z3A26 26 weeks gestation of pregnancy: Secondary | ICD-10-CM | POA: Insufficient documentation

## 2020-08-25 DIAGNOSIS — O99512 Diseases of the respiratory system complicating pregnancy, second trimester: Secondary | ICD-10-CM | POA: Insufficient documentation

## 2020-08-25 DIAGNOSIS — Z20822 Contact with and (suspected) exposure to covid-19: Secondary | ICD-10-CM | POA: Insufficient documentation

## 2020-08-25 LAB — CBC
HCT: 31 % — ABNORMAL LOW (ref 36.0–46.0)
Hemoglobin: 10.2 g/dL — ABNORMAL LOW (ref 12.0–15.0)
MCH: 29.3 pg (ref 26.0–34.0)
MCHC: 32.9 g/dL (ref 30.0–36.0)
MCV: 89.1 fL (ref 80.0–100.0)
Platelets: 321 10*3/uL (ref 150–400)
RBC: 3.48 MIL/uL — ABNORMAL LOW (ref 3.87–5.11)
RDW: 13.1 % (ref 11.5–15.5)
WBC: 8.4 10*3/uL (ref 4.0–10.5)
nRBC: 0 % (ref 0.0–0.2)

## 2020-08-25 LAB — BASIC METABOLIC PANEL
Anion gap: 14 (ref 5–15)
BUN: 5 mg/dL — ABNORMAL LOW (ref 6–20)
CO2: 19 mmol/L — ABNORMAL LOW (ref 22–32)
Calcium: 8.9 mg/dL (ref 8.9–10.3)
Chloride: 102 mmol/L (ref 98–111)
Creatinine, Ser: 0.48 mg/dL (ref 0.44–1.00)
GFR, Estimated: >60 mL/min (ref >60)
Glucose, Bld: 150 mg/dL — ABNORMAL HIGH (ref 70–99)
Potassium: 3.4 mmol/L — ABNORMAL LOW (ref 3.5–5.1)
Sodium: 135 mmol/L (ref 135–145)

## 2020-08-25 NOTE — ED Triage Notes (Signed)
Pt reports that for the past 3 days she has been having cough, sore throat, SOB, body aches, pt is partially vaccinated, took covid test today that was negative. Pt is also [redacted] weeks pregnant

## 2020-08-25 NOTE — ED Triage Notes (Signed)
Emergency Medicine Provider OB Triage Evaluation Note  [name] is a 39 y.o. female, [id number], at [redacted]w[redacted]d gestation who presents to the emergency department with complaints of URI symptoms.   The patient reports that she has been having cough, sore throat, and body aches for the last 3 days.  She has had increasing shortness of breath that worsened today.  States that she had 1 dose of the maternal vaccine in November.  She took a Covid test earlier today that was negative.   She denies fever, chills, vaginal bleeding, discharge.  She did note some cramping in her right lower abdomen earlier today while she was coughing, but denies abdominal pain at this time.  She is a [id number] at 26 weeks and 4 days.  She has had good fetal movement.   Review of  Systems  Positive: Shortness of breath, cough, sore throat, body aches, abdominal pain Negative: Vaginal bleeding, discharge, fever, chills  Physical Exam  BP 108/71 (BP Location: Right Arm)   Pulse 100   Temp 98.3 F (36.8 C)   Resp 18   LMP 02/22/2020 (Exact Date)   SpO2 100%  General: Awake, no distress  HEENT: Atraumatic  Resp: Normal effort, lung sounds are mildly diminished, coughs throughout exam Cardiac: Normal rate Abd: Nondistended, nontender  MSK: Moves all extremities without difficulty Neuro: Speech clear  Medical Decision Making  Pt evaluated for pregnancy concern and is stable for transfer to MAU. Pt is in agreement with plan for transfer.  12:34 AM Discussed with MAU APP, Wynelle Bourgeois, who accepts patient in transfer.  Clinical Impression  No diagnosis found.     Frederik Pear A, PA-C 08/26/20 0034

## 2020-08-26 ENCOUNTER — Encounter (HOSPITAL_COMMUNITY): Payer: Self-pay | Admitting: Obstetrics & Gynecology

## 2020-08-26 DIAGNOSIS — J45901 Unspecified asthma with (acute) exacerbation: Secondary | ICD-10-CM

## 2020-08-26 LAB — RESP PANEL BY RT-PCR (RSV, FLU A&B, COVID)  RVPGX2
Influenza A by PCR: NEGATIVE
Influenza B by PCR: NEGATIVE
Resp Syncytial Virus by PCR: NEGATIVE
SARS Coronavirus 2 by RT PCR: NEGATIVE

## 2020-08-26 LAB — GROUP A STREP BY PCR: Group A Strep by PCR: NOT DETECTED

## 2020-08-26 MED ORDER — PREDNISONE 10 MG PO TABS
20.0000 mg | ORAL_TABLET | Freq: Every day | ORAL | 0 refills | Status: AC
Start: 2020-08-26 — End: 2020-08-30

## 2020-08-26 MED ORDER — GUAIFENESIN ER 600 MG PO TB12: 600.0000 mg | ORAL_TABLET | Freq: Two times a day (BID) | ORAL | 0 refills | Status: AC

## 2020-08-26 NOTE — MAU Provider Note (Signed)
Chief Complaint:  Generalized Body Aches and Shortness of Breath   Event Date/Time   First Provider Initiated Contact with Patient 08/26/20 0021     HPI: [name] is a 39 y.o. [id number] at 69w4dwho presents to maternity admissions reporting congestion and cough. Has asthma, has had some wheezing. No known exposure to illness.  No fever.. She reports good fetal movement, denies LOF, vaginal bleeding, vaginal itching/burning, urinary symptoms, h/a, dizziness, n/v, diarrhea, constipation or fever/chills.  .  Shortness of Breath This is a new problem. The current episode started in the past 7 days. The problem has been unchanged. Associated symptoms include a sore throat and wheezing. Pertinent negatives include no abdominal pain, fever, headaches, hemoptysis, orthopnea, syncope or vomiting. Nothing aggravates the symptoms. She has tried beta agonist inhalers for the symptoms. The treatment provided mild relief. Her past medical history is significant for asthma.     RN note: Pt has had chest congestion and cough for 3 days. Denies fever. Has h/a and vomited 4 times Weds. Hx asthma. Audible wheezing noted  Past Medical History: Past Medical History:  Diagnosis Date  . Anxiety    relative to circumstances to back problems/injury, not taking any medicine, pt. reports has been crying a lot  . Arthritis    low back  . Asthma   . Back pain   . Cholelithiasis 04/08/2020  . Depression   . GERD (gastroesophageal reflux disease)   . Migraines   . Neck pain     Past obstetric history: OB History  Gravida Para Term Preterm AB Living  9 5 5   3 5   SAB IAB Ectopic Multiple Live Births  1 2   0 5    # Outcome Date GA Lbr Len/2nd Weight Sex Delivery Anes PTL Lv  9 Current           8 Term 12/27/18 [redacted]w[redacted]d 08:45 / 00:19 3980 g M Vag-Spont EPI  LIV  7 SAB 11/2017          6 IAB 2016          5 Term 01/08/09 [redacted]w[redacted]d   F Vag-Spont   LIV  4 Term 04/23/07 [redacted]w[redacted]d   M Vag-Spont   LIV  3 Term  04/10/02 [redacted]w[redacted]d   F Vag-Spont   LIV  2 Term 03/30/98 [redacted]w[redacted]d   M Vag-Spont   LIV  1 IAB             Past Surgical History: Past Surgical History:  Procedure Laterality Date  . childbirth     x4 vaginal   . LUMBAR LAMINECTOMY/DECOMPRESSION MICRODISCECTOMY  06/05/2012   Procedure: LUMBAR LAMINECTOMY/DECOMPRESSION MICRODISCECTOMY;  Surgeon: 06/07/2012, MD;  Location: [facility] OR;  Service: Orthopedics;  Laterality: Left;  Left sided lumbar 4-5 microdisectomy    Family History: Family History  Problem Relation Age of Onset  . Asthma Brother     Social History: Social History   Tobacco Use  . Smoking status: Never Smoker  . Smokeless tobacco: Never Used  Vaping Use  . Vaping Use: Never used  Substance Use Topics  . Alcohol use: No  . Drug use: No    Allergies:  Allergies  Allergen Reactions  . Hydrocodone-Acetaminophen Itching    Has some itching on face & nose but not bad enough to stop taking it/ MD aware and instructed patient to take anti-allergy medication prn.    Meds:  Medications Prior to Admission  Medication Sig Dispense Refill Last Dose  .  acetaminophen (TYLENOL) 500 MG tablet Take 500 mg by mouth every 6 (six) hours as needed.   08/25/2020 at 1800  . albuterol (ACCUNEB) 0.63 MG/3ML nebulizer solution Take 1 ampule by nebulization every 6 (six) hours as needed for wheezing.   08/25/2020 at Unknown time  . Prenatal Vit-Fe Fumarate-FA (PRENATAL MULTIVITAMIN) TABS tablet Take 1 tablet by mouth daily at 12 noon.   08/25/2020 at Unknown time  . loratadine (CLARITIN) 10 MG tablet Take 10 mg by mouth daily. (Patient not taking: Reported on 07/25/2020)     . ondansetron (ZOFRAN ODT) 4 MG disintegrating tablet Take 1 tablet (4 mg total) by mouth every 6 (six) hours as needed. (Patient not taking: Reported on 07/25/2020) 20 tablet 0   . predniSONE (DELTASONE) 20 MG tablet 3 tabs po day one, then 2 tabs daily x 4 days (Patient not taking: Reported on 07/25/2020) 11 tablet  0   . simethicone (GAS-X) 80 MG chewable tablet Chew 1 tablet (80 mg total) by mouth every 6 (six) hours as needed for flatulence. (Patient not taking: Reported on 07/25/2020) 30 tablet 0     I have reviewed patient's Past Medical Hx, Surgical Hx, Family Hx, Social Hx, medications and allergies.   ROS:  Review of Systems  Constitutional: Negative for fever.  HENT: Positive for sore throat.   Respiratory: Positive for shortness of breath and wheezing. Negative for hemoptysis.   Cardiovascular: Negative for orthopnea and syncope.  Gastrointestinal: Negative for abdominal pain and vomiting.  Neurological: Negative for headaches.   Other systems negative  Physical Exam   Patient Vitals for the past 24 hrs:  BP Temp Temp src Pulse Resp SpO2  08/26/20 0001 108/71 98.3 F (36.8 C) -- 100 18 --  08/25/20 2156 114/80 98.3 F (36.8 C) Oral (!) 103 16 100 %   Constitutional: Well-developed, well-nourished female in no acute distress.  Cardiovascular: normal rate and rhythm Respiratory: normal effort, clear to auscultation bilaterally  No wheezing on my exam GI: Abd soft, non-tender, gravid appropriate for gestational age.   No rebound or guarding. MS: Extremities nontender, no edema, normal ROM Neurologic: Alert and oriented x 4.  GU: Neg CVAT.  PELVIC EXAM: deferred  FHT:  Baseline 140 , moderate variability, accelerations present, no decelerations Contractions: intermittent irritability   Labs: Results for orders placed or performed during the hospital encounter of 08/25/20 (from the past 24 hour(s))  Basic metabolic panel     Status: Abnormal   Collection Time: 08/25/20 10:17 PM  Result Value Ref Range   Sodium 135 135 - 145 mmol/L   Potassium 3.4 (L) 3.5 - 5.1 mmol/L   Chloride 102 98 - 111 mmol/L   CO2 19 (L) 22 - 32 mmol/L   Glucose, Bld 150 (H) 70 - 99 mg/dL   BUN 5 (L) 6 - 20 mg/dL   Creatinine, Ser 7.78 0.44 - 1.00 mg/dL   Calcium 8.9 8.9 - 24.2 mg/dL   GFR,  Estimated >35 >36 mL/min   Anion gap 14 5 - 15  CBC     Status: Abnormal   Collection Time: 08/25/20 10:17 PM  Result Value Ref Range   WBC 8.4 4.0 - 10.5 K/uL   RBC 3.48 (L) 3.87 - 5.11 MIL/uL   Hemoglobin 10.2 (L) 12.0 - 15.0 g/dL   HCT 14.4 (L) 31.5 - 40.0 %   MCV 89.1 80.0 - 100.0 fL   MCH 29.3 26.0 - 34.0 pg   MCHC 32.9 30.0 - 36.0 g/dL  RDW 13.1 11.5 - 15.5 %   Platelets 321 150 - 400 K/uL   nRBC 0.0 0.0 - 0.2 %  Resp panel by RT-PCR (RSV, Flu A&B, Covid) Nasopharyngeal Swab     Status: None   Collection Time: 08/26/20 12:02 AM   Specimen: Nasopharyngeal Swab; Nasopharyngeal(NP) swabs in vial transport medium  Result Value Ref Range   SARS Coronavirus 2 by RT PCR NEGATIVE NEGATIVE   Influenza A by PCR NEGATIVE NEGATIVE   Influenza B by PCR NEGATIVE NEGATIVE   Resp Syncytial Virus by PCR NEGATIVE NEGATIVE  Group A Strep by PCR     Status: None   Collection Time: 08/26/20  1:00 AM   Specimen: Throat; Sterile Swab  Result Value Ref Range   Group A Strep by PCR NOT DETECTED NOT DETECTED       Imaging:  DG Chest 2 View  Result Date: 08/25/2020 CLINICAL DATA:  Dyspnea EXAM: CHEST - 2 VIEW COMPARISON:  01/03/2020 FINDINGS: Lungs are well expanded, symmetric, and clear. No pneumothorax or pleural effusion. Cardiac size within normal limits. Pulmonary vascularity is normal. Osseous structures are age-appropriate. No acute bone abnormality. IMPRESSION: No active cardiopulmonary disease. Electronically Signed   By: Helyn Numbers MD   On: 08/25/2020 22:46     MAU Course/MDM: I have ordered labs and reviewed results. Negative for covid and flu.  CXR clear.  NST reviewed, reassuring for gestational age Consult Dr Macon Large with presentation, exam findings and test results.  Treatments in MAU included EFM. Had already used inhaler Discussed I will prescribe a short Steroid taper for asthma  Will Rx mucinex for cough. .    Assessment: Single IUP at [redacted]w[redacted]d Upper Respiratory  Infection Asthma exacerbation  Plan: Discharge home Rx Mucinex for cough Rx Prednisone burst for asthma Preterm Labor precautions and fetal kick counts Follow up in Office for prenatal visits   Encouraged to return if she develops worsening of symptoms, increase in pain, fever, or other concerning symptoms.   Pt stable at time of discharge.  Wynelle Bourgeois CNM, MSN Certified Nurse-Midwife 08/26/2020 12:21 AM

## 2020-08-26 NOTE — MAU Note (Signed)
OK to d/c EFM per M Williams CNM 

## 2020-08-26 NOTE — Discharge Instructions (Signed)
Upper Respiratory Infection, Adult An upper respiratory infection (URI) affects the nose, throat, and upper air passages. URIs are caused by germs (viruses). The most common type of URI is often called "the common cold." Medicines cannot cure URIs, but you can do things at home to relieve your symptoms. URIs usually get better within 7-10 days. Follow these instructions at home: Activity  Rest as needed.  If you have a fever, stay home from work or school until your fever is gone, or until your doctor says you may return to work or school. ? You should stay home until you cannot spread the infection anymore (you are not contagious). ? Your doctor may have you wear a face mask so you have less risk of spreading the infection. Relieving symptoms  Gargle with a salt-water mixture 3-4 times a day or as needed. To make a salt-water mixture, completely dissolve -1 tsp of salt in 1 cup of warm water.  Use a cool-mist humidifier to add moisture to the air. This can help you breathe more easily. Eating and drinking   Drink enough fluid to keep your pee (urine) pale yellow.  Eat soups and other clear broths. General instructions   Take over-the-counter and prescription medicines only as told by your doctor. These include cold medicines, fever reducers, and cough suppressants.  Do not use any products that contain nicotine or tobacco. These include cigarettes and e-cigarettes. If you need help quitting, ask your doctor.  Avoid being where people are smoking (avoid secondhand smoke).  Make sure you get regular shots and get the flu shot every year.  Keep all follow-up visits as told by your doctor. This is important. How to avoid spreading infection to others   Wash your hands often with soap and water. If you do not have soap and water, use hand sanitizer.  Avoid touching your mouth, face, eyes, or nose.  Cough or sneeze into a tissue or your sleeve or elbow. Do not cough or sneeze  into your hand or into the air. Contact a doctor if:  You are getting worse, not better.  You have any of these: ? A fever. ? Chills. ? Brown or red mucus in your nose. ? Yellow or brown fluid (discharge)coming from your nose. ? Pain in your face, especially when you bend forward. ? Swollen neck glands. ? Pain with swallowing. ? White areas in the back of your throat. Get help right away if:  You have shortness of breath that gets worse.  You have very bad or constant: ? Headache. ? Ear pain. ? Pain in your forehead, behind your eyes, and over your cheekbones (sinus pain). ? Chest pain.  You have long-lasting (chronic) lung disease along with any of these: ? Wheezing. ? Long-lasting cough. ? Coughing up blood. ? A change in your usual mucus.  You have a stiff neck.  You have changes in your: ? Vision. ? Hearing. ? Thinking. ? Mood. Summary  An upper respiratory infection (URI) is caused by a germ called a virus. The most common type of URI is often called "the common cold."  URIs usually get better within 7-10 days.  Take over-the-counter and prescription medicines only as told by your doctor. This information is not intended to replace advice given to you by your health care provider. Make sure you discuss any questions you have with your health care provider. Document Revised: 09/04/2018 Document Reviewed: 04/19/2017 Elsevier Patient Education  2020 [employer] Asthma Attack Prevention, Adult  Although you may not be able to control the fact that you have asthma, you can take actions to prevent episodes of asthma (asthma attacks). These actions include:  Creating a written plan for managing and treating your asthma attacks (asthma action plan).  Monitoring your asthma.  Avoiding things that can irritate your airways or make your asthma symptoms worse (asthma triggers).  Taking your medicines as directed.  Acting quickly if you have signs or symptoms of an  asthma attack. What are some ways to prevent an asthma attack? Create a plan Work with your health care provider to create an asthma action plan. This plan should include:  A list of your asthma triggers and how to avoid them.  A list of symptoms that you experience during an asthma attack.  Information about when to take medicine and how much medicine to take.  Information to help you understand your peak flow measurements.  Contact information for your health care providers.  Daily actions that you can take to control asthma. Monitor your asthma To monitor your asthma:  Use your peak flow meter every morning and every evening for 2-3 weeks. Record the results in a journal. A drop in your peak flow numbers on one or more days may mean that you are starting to have an asthma attack, even if you are not having symptoms.  When you have asthma symptoms, write them down in a journal.  Avoid asthma triggers Work with your health care provider to find out what your asthma triggers are. This can be done by:  Being tested for allergies.  Keeping a journal that notes when asthma attacks occur and what may have contributed to them.  Asking your health care provider whether other medical conditions make your asthma worse. Common asthma triggers include:  Dust.  Smoke. This includes campfire smoke and secondhand smoke from tobacco products.  Pet dander.  Trees, grasses or pollens.  Very cold, dry, or humid air.  Mold.  Foods that contain high amounts of sulfites.  Strong smells.  Engine exhaust and air pollution.  Aerosol sprays and fumes from household cleaners.  Household pests and their droppings, including dust mites and cockroaches.  Certain medicines, including NSAIDs. Once you have determined your asthma triggers, take steps to avoid them. Depending on your triggers, you may be able to reduce the chance of an asthma attack by:  Keeping your home clean. Have  someone dust and vacuum your home for you 1 or 2 times a week. If possible, have them use a high-efficiency particulate arrestance (HEPA) vacuum.  Washing your sheets weekly in hot water.  Using allergy-proof mattress covers and casings on your bed.  Keeping pets out of your home.  Taking care of mold and water problems in your home.  Avoiding areas where people smoke.  Avoiding using strong perfumes or odor sprays.  Avoid spending a lot of time outdoors when pollen counts are high and on very windy days.  Talking with your health care provider before stopping or starting any new medicines. Medicines Take over-the-counter and prescription medicines only as told by your health care provider. Many asthma attacks can be prevented by carefully following your medicine schedule. Taking your medicines correctly is especially important when you cannot avoid certain asthma triggers. Even if you are doing well, do not stop taking your medicine and do not take less medicine. Act quickly If an asthma attack happens, acting quickly can decrease how severe it is and how long it  lasts. Take these actions:  Pay attention to your symptoms. If you are coughing, wheezing, or having difficulty breathing, do not wait to see if your symptoms go away on their own. Follow your asthma action plan.  If you have followed your asthma action plan and your symptoms are not improving, call your health care provider or seek immediate medical care at the nearest hospital. It is important to write down how often you need to use your fast-acting rescue inhaler. You can track how often you use an inhaler in your journal. If you are using your rescue inhaler more often, it may mean that your asthma is not under control. Adjusting your asthma treatment plan may help you to prevent future asthma attacks and help you to gain better control of your condition. How can I prevent an asthma attack when I exercise? Exercise is a  common asthma trigger. To prevent asthma attacks during exercise:  Follow advice from your health care provider about whether you should use your fast-acting inhaler before exercising. Many people with asthma experience exercise-induced bronchoconstriction (EIB). This condition often worsens during vigorous exercise in cold, humid, or dry environments. Usually, people with EIB can stay very active by using a fast-acting inhaler before exercising.  Avoid exercising outdoors in very cold or humid weather.  Avoid exercising outdoors when pollen counts are high.  Warm up and cool down when exercising.  Stop exercising right away if asthma symptoms start. Consider taking part in exercises that are less likely to cause asthma symptoms such as:  Indoor swimming.  Biking.  Walking.  Hiking.  Playing football. This information is not intended to replace advice given to you by your health care provider. Make sure you discuss any questions you have with your health care provider. Document Revised: 08/09/2017 Document Reviewed: 02/11/2016 Elsevier Patient Education  2020 [employer].

## 2020-08-26 NOTE — MAU Note (Signed)
Pt has had chest congestion and cough for 3 days. Denies fever. Has h/a and vomited 4 times Weds. Hx asthma. Audible wheezing noted

## 2020-08-26 NOTE — Progress Notes (Signed)
Written and verbal d/c instructions given and understanding voiced. 

## 2020-09-08 LAB — GLUCOSE, 1 HOUR GESTATIONAL: Glucose 1 Hour: 184

## 2020-09-10 NOTE — L&D Delivery Note (Signed)
LABOR COURSE Patient was admitted for IOL 2/2 Cholestasis of Pregnancy. She was augmented with Pitocin. She SROM'd during second stage.    Delivery Note Patient resting in right lateral, as previously discussed to encourage baby to reposition. Patient visibly grunting during contractions, reporting inability to avoid pushing. CNM at bedside for practice push in right lateral. Excellent maternal effort, fetal descent noted. RN and labor team made aware. Head delivered Direct OP. No nuchal cord present. Shoulder and body delivered in usual fashion. At 1536 a viable female was delivered via Vaginal, Spontaneous (Presentation: Direct OA, no restitution).  Infant with spontaneous cry, placed on mother's abdomen, dried and stimulated. Cord clamped x 2 after two-minute delay, and cut by CNM per patient request. Cord blood drawn. Placenta delivered spontaneously with gentle cord traction . Appears intact. Fundus firm with massage and Pitocin. TXA given prophylactically based on grand multiparity. Labia, perineum, vagina, and cervix inspected.   APGAR:8, 9; weight: 3615g.    Anesthesia:  Epidural Episiotomy: None Lacerations: None Est. Blood Loss (mL): 100  Mom to postpartum.  Baby to Couplet care / Skin to Skin.  Clayton Bibles, [place] 11/07/20 3:53 PM

## 2020-09-19 ENCOUNTER — Other Ambulatory Visit: Payer: Self-pay

## 2020-09-19 ENCOUNTER — Ambulatory Visit: Payer: Self-pay | Admitting: *Deleted

## 2020-09-19 ENCOUNTER — Encounter: Payer: Self-pay | Admitting: *Deleted

## 2020-09-19 ENCOUNTER — Ambulatory Visit: Payer: Self-pay | Attending: Obstetrics and Gynecology

## 2020-09-19 VITALS — BP 114/73 | HR 95

## 2020-09-19 DIAGNOSIS — Z3A30 30 weeks gestation of pregnancy: Secondary | ICD-10-CM

## 2020-09-19 DIAGNOSIS — E669 Obesity, unspecified: Secondary | ICD-10-CM

## 2020-09-19 DIAGNOSIS — O09522 Supervision of elderly multigravida, second trimester: Secondary | ICD-10-CM | POA: Insufficient documentation

## 2020-09-19 DIAGNOSIS — O99213 Obesity complicating pregnancy, third trimester: Secondary | ICD-10-CM

## 2020-09-19 DIAGNOSIS — O24410 Gestational diabetes mellitus in pregnancy, diet controlled: Secondary | ICD-10-CM

## 2020-09-19 DIAGNOSIS — Z362 Encounter for other antenatal screening follow-up: Secondary | ICD-10-CM

## 2020-09-19 DIAGNOSIS — O0933 Supervision of pregnancy with insufficient antenatal care, third trimester: Secondary | ICD-10-CM

## 2020-09-19 DIAGNOSIS — O322XX0 Maternal care for transverse and oblique lie, not applicable or unspecified: Secondary | ICD-10-CM

## 2020-09-20 ENCOUNTER — Other Ambulatory Visit: Payer: Self-pay | Admitting: *Deleted

## 2020-09-20 DIAGNOSIS — O24410 Gestational diabetes mellitus in pregnancy, diet controlled: Secondary | ICD-10-CM

## 2020-09-27 ENCOUNTER — Other Ambulatory Visit: Payer: Self-pay

## 2020-09-27 ENCOUNTER — Ambulatory Visit (INDEPENDENT_AMBULATORY_CARE_PROVIDER_SITE_OTHER): Payer: Self-pay | Admitting: Obstetrics and Gynecology

## 2020-09-27 ENCOUNTER — Encounter: Payer: Self-pay | Admitting: Obstetrics and Gynecology

## 2020-09-27 VITALS — BP 114/75 | HR 88 | Wt 224.6 lb

## 2020-09-27 DIAGNOSIS — F32A Depression, unspecified: Secondary | ICD-10-CM

## 2020-09-27 DIAGNOSIS — O24419 Gestational diabetes mellitus in pregnancy, unspecified control: Secondary | ICD-10-CM

## 2020-09-27 DIAGNOSIS — Z23 Encounter for immunization: Secondary | ICD-10-CM

## 2020-09-27 DIAGNOSIS — F4310 Post-traumatic stress disorder, unspecified: Secondary | ICD-10-CM

## 2020-09-27 DIAGNOSIS — O0990 Supervision of high risk pregnancy, unspecified, unspecified trimester: Secondary | ICD-10-CM

## 2020-09-27 DIAGNOSIS — O09523 Supervision of elderly multigravida, third trimester: Secondary | ICD-10-CM

## 2020-09-27 DIAGNOSIS — O99210 Obesity complicating pregnancy, unspecified trimester: Secondary | ICD-10-CM

## 2020-09-27 DIAGNOSIS — R45851 Suicidal ideations: Secondary | ICD-10-CM

## 2020-09-27 HISTORY — DX: Obesity complicating pregnancy, unspecified trimester: O99.210

## 2020-09-27 HISTORY — DX: Supervision of high risk pregnancy, unspecified, unspecified trimester: O09.90

## 2020-09-27 MED ORDER — ASPIRIN EC 81 MG PO TBEC
81.0000 mg | DELAYED_RELEASE_TABLET | Freq: Every day | ORAL | 2 refills | Status: DC
Start: 2020-09-27 — End: 2020-11-08

## 2020-09-27 NOTE — Progress Notes (Signed)
   PRENATAL VISIT NOTE  Subjective:  [name] is a 40 y.o. [id number] at [redacted]w[redacted]d being seen today for ongoing prenatal care. Patient is transferring care from the health department due to recent diagnosis of gestational diabetes. She is currently monitored for the following issues for this high-risk pregnancy and has HNP (herniated nucleus pulposus), lumbar; PTSD (post-traumatic stress disorder); Depression with suicidal ideation; Suicide attempt by multiple drug overdose (HCC); Chronic pain following surgery or procedure; ASCUS of cervix with negative high risk HPV; Language barrier; AMA (advanced maternal age) multigravida 35+; Cholelithiasis; Supervision of high risk pregnancy, antepartum; Maternal obesity affecting pregnancy, antepartum; and Gestational diabetes mellitus (GDM) affecting pregnancy on their problem list.  Patient reports no complaints.  Contractions: Irritability. Vag. Bleeding: None.  Movement: Present. Denies leaking of fluid.   The following portions of the patient's history were reviewed and updated as appropriate: allergies, current medications, past family history, past medical history, past social history, past surgical history and problem list.   Objective:   Vitals:   09/27/20 1536  BP: 114/75  Pulse: 88  Weight: 224 lb 9.6 oz (101.9 kg)    Fetal Status: Fetal Heart Rate (bpm): 154 Fundal Height: 36 cm Movement: Present     General:  Alert, oriented and cooperative. Patient is in no acute distress.  Skin: Skin is warm and dry. No rash noted.   Cardiovascular: Normal heart rate noted  Respiratory: Normal respiratory effort, no problems with respiration noted  Abdomen: Soft, gravid, appropriate for gestational age.  Pain/Pressure: Present     Pelvic: Cervical exam deferred        Extremities: Normal range of motion.  Edema: None  Mental Status: Normal mood and affect. Normal behavior. Normal judgment and thought content.   Assessment and Plan:  Pregnancy:  [id number] at [redacted]w[redacted]d 1. Supervision of high risk pregnancy, antepartum Patient is doing well without complaints Plans depo-provera for contraception  2. Multigravida of advanced maternal age in third trimester   3. Maternal obesity affecting pregnancy, antepartum Rx ASA provided  4. Gestational diabetes mellitus (GDM) affecting pregnancy Recent growth scan on 09/19/20 with EFW 1942 gm 97%tile Weekly fetal testing schedule with MFM to start at 32 weeks Patient scheduled for education next week  Preterm labor symptoms and general obstetric precautions including but not limited to vaginal bleeding, contractions, leaking of fluid and fetal movement were reviewed in detail with the patient. Please refer to After Visit Summary for other counseling recommendations.   Return in about 2 weeks (around 10/11/2020) for ROB, High risk.  Future Appointments  Date Time Provider Department Center  10/03/2020  7:15 AM [facility] NURSE [facility]  10/03/2020  7:30 AM [facility] US3 [facility]  10/04/2020 11:15 AM [facility]ED[facility]ATION [facility]  10/10/2020  7:30 AM [facility] NURSE [facility]  10/10/2020  7:45 AM [facility] US5 [facility]  10/12/2020  3:55 PM Venora Maples, MD [facility]  10/17/2020  7:45 AM [facility] NURSE [facility]  10/17/2020  8:00 AM [facility] US1 [facility]    Catalina Antigua, MD

## 2020-10-03 ENCOUNTER — Ambulatory Visit: Payer: Self-pay | Admitting: *Deleted

## 2020-10-03 ENCOUNTER — Encounter: Payer: Self-pay | Admitting: *Deleted

## 2020-10-03 ENCOUNTER — Ambulatory Visit: Payer: Self-pay | Attending: Obstetrics and Gynecology

## 2020-10-03 ENCOUNTER — Other Ambulatory Visit: Payer: Self-pay

## 2020-10-03 DIAGNOSIS — O99210 Obesity complicating pregnancy, unspecified trimester: Secondary | ICD-10-CM

## 2020-10-03 DIAGNOSIS — O24419 Gestational diabetes mellitus in pregnancy, unspecified control: Secondary | ICD-10-CM

## 2020-10-03 DIAGNOSIS — O24410 Gestational diabetes mellitus in pregnancy, diet controlled: Secondary | ICD-10-CM

## 2020-10-03 DIAGNOSIS — O0990 Supervision of high risk pregnancy, unspecified, unspecified trimester: Secondary | ICD-10-CM | POA: Insufficient documentation

## 2020-10-04 ENCOUNTER — Other Ambulatory Visit: Payer: Self-pay

## 2020-10-04 ENCOUNTER — Encounter: Payer: Self-pay | Attending: Family Medicine | Admitting: Registered"

## 2020-10-04 ENCOUNTER — Ambulatory Visit: Payer: Self-pay | Admitting: Registered"

## 2020-10-04 DIAGNOSIS — O24419 Gestational diabetes mellitus in pregnancy, unspecified control: Secondary | ICD-10-CM | POA: Insufficient documentation

## 2020-10-04 NOTE — Progress Notes (Signed)
In-person interpreter services provided by Raquel from [facility]  Patient was seen on 10/04/20 for Gestational Diabetes self-management. EDD 11/28/20. Patient states no history of GDM. Diet history obtained. Patient eats variety of all food groups. Beverages include water, milk, small amount of juice. Patient has made changes to diet since diagnosis. Pt states she has recently felt more energy and sleeping better.  The following learning objectives were met by the patient :   States the definition of Gestational Diabetes  States why dietary management is important in controlling blood glucose  Describes the effects of carbohydrates on blood glucose levels  Demonstrates ability to create a balanced meal plan  Demonstrates carbohydrate counting   States when to check blood glucose levels  Demonstrates proper blood glucose monitoring techniques  States the effect of stress and exercise on blood glucose levels  States the importance of limiting caffeine and abstaining from alcohol and smoking  Plan:  Aim for 3 Carbohydrate Choices per meal (45 grams) +/- 1 either way  Aim for 1-2 Carbohydrate Choices per snack Begin reading food labels for Total Carbohydrate of foods If OK with your MD, consider  increasing your activity level by walking, Arm Chair Exercises or other activity daily as tolerated Begin checking Blood Glucose before breakfast and 2 hours after first bite of breakfast, lunch and dinner as directed by MD  Bring Log Book/Sheet and meter to every medical appointment  Baby Scripts: (BS 2.0 not capable of glucose management at this time.) Patient to record blood sugar on glucose log sheet  Take medication if directed by MD  Blood glucose monitor given: Pro[employer] Lot # [id number] CBG: 88 mg/dL  Patient instructed to monitor glucose levels: FBS: 60 - 95 mg/dl 2 hour: <120 mg/dl  Patient received the following handouts:  Nutrition Diabetes and Pregnancy  Carbohydrate  Counting List  Blood glucose Log Sheet  Patient will be seen for follow-up in 1-2 weeks or as needed.

## 2020-10-10 ENCOUNTER — Ambulatory Visit: Payer: Self-pay | Attending: Obstetrics and Gynecology

## 2020-10-10 ENCOUNTER — Encounter: Payer: Self-pay | Admitting: *Deleted

## 2020-10-10 ENCOUNTER — Other Ambulatory Visit: Payer: Self-pay

## 2020-10-10 ENCOUNTER — Ambulatory Visit: Payer: Self-pay | Admitting: *Deleted

## 2020-10-10 ENCOUNTER — Other Ambulatory Visit: Payer: Self-pay | Admitting: *Deleted

## 2020-10-10 DIAGNOSIS — O24419 Gestational diabetes mellitus in pregnancy, unspecified control: Secondary | ICD-10-CM

## 2020-10-10 DIAGNOSIS — O24410 Gestational diabetes mellitus in pregnancy, diet controlled: Secondary | ICD-10-CM

## 2020-10-10 DIAGNOSIS — O0990 Supervision of high risk pregnancy, unspecified, unspecified trimester: Secondary | ICD-10-CM

## 2020-10-10 DIAGNOSIS — O99210 Obesity complicating pregnancy, unspecified trimester: Secondary | ICD-10-CM | POA: Insufficient documentation

## 2020-10-12 ENCOUNTER — Telehealth (INDEPENDENT_AMBULATORY_CARE_PROVIDER_SITE_OTHER): Payer: Self-pay | Admitting: Family Medicine

## 2020-10-12 ENCOUNTER — Encounter: Payer: Self-pay | Admitting: Family Medicine

## 2020-10-12 DIAGNOSIS — O09523 Supervision of elderly multigravida, third trimester: Secondary | ICD-10-CM

## 2020-10-12 DIAGNOSIS — O26613 Liver and biliary tract disorders in pregnancy, third trimester: Secondary | ICD-10-CM

## 2020-10-12 DIAGNOSIS — E669 Obesity, unspecified: Secondary | ICD-10-CM

## 2020-10-12 DIAGNOSIS — O0990 Supervision of high risk pregnancy, unspecified, unspecified trimester: Secondary | ICD-10-CM

## 2020-10-12 DIAGNOSIS — K8020 Calculus of gallbladder without cholecystitis without obstruction: Secondary | ICD-10-CM

## 2020-10-12 DIAGNOSIS — Z789 Other specified health status: Secondary | ICD-10-CM

## 2020-10-12 DIAGNOSIS — Z3A33 33 weeks gestation of pregnancy: Secondary | ICD-10-CM

## 2020-10-12 DIAGNOSIS — O24419 Gestational diabetes mellitus in pregnancy, unspecified control: Secondary | ICD-10-CM

## 2020-10-12 DIAGNOSIS — O99210 Obesity complicating pregnancy, unspecified trimester: Secondary | ICD-10-CM

## 2020-10-12 DIAGNOSIS — O99213 Obesity complicating pregnancy, third trimester: Secondary | ICD-10-CM

## 2020-10-12 DIAGNOSIS — R87610 Atypical squamous cells of undetermined significance on cytologic smear of cervix (ASC-US): Secondary | ICD-10-CM

## 2020-10-12 NOTE — Progress Notes (Signed)
I connected with [name] 10/12/20 at  9:15 AM EST by: MyChart video and verified that I am speaking with the correct person using two identifiers.  Patient is located at home and provider is located at [facility].     The purpose of this virtual visit is to provide medical care while limiting exposure to the novel coronavirus. I discussed the limitations, risks, security and privacy concerns of performing an evaluation and management service by telephone (unable to connect by video) and the availability of in person appointments. I also discussed with the patient that there may be a patient responsible charge related to this service. By engaging in this virtual visit, you consent to the provision of healthcare.  Additionally, you authorize for your insurance to be billed for the services provided during this visit.  The patient expressed understanding and agreed to proceed.  The following staff members participated in the virtual visit:  Venora Maples, MD/MPH    PRENATAL VISIT NOTE  Subjective:  [name] is a 40 y.o. [id number] at [redacted]w[redacted]d  for phone visit for ongoing prenatal care.  She is currently monitored for the following issues for this high-risk pregnancy and has HNP (herniated nucleus pulposus), lumbar; PTSD (post-traumatic stress disorder); Depression with suicidal ideation; Suicide attempt by multiple drug overdose (HCC); Chronic pain following surgery or procedure; ASCUS of cervix with negative high risk HPV; Language barrier; AMA (advanced maternal age) multigravida 35+; Cholelithiasis; Supervision of high risk pregnancy, antepartum; Maternal obesity affecting pregnancy, antepartum; and Gestational diabetes mellitus (GDM) affecting pregnancy on their problem list.  Patient reports occasional contractions.  Contractions: Irritability. Vag. Bleeding: None.  Movement: Present. Denies leaking of fluid.   The following portions of the patient's history were reviewed and  updated as appropriate: allergies, current medications, past family history, past medical history, past social history, past surgical history and problem list.   Objective:  There were no vitals filed for this visit. Self-Obtained  Fetal Status:     Movement: Present     Assessment and Plan:  Pregnancy: [id number] at [redacted]w[redacted]d 1. Supervision of high risk pregnancy, antepartum Good fetal movement  2. Maternal obesity affecting pregnancy, antepartum   3. Gestational diabetes mellitus (GDM) affecting pregnancy fastings range 70-high 90's Majority of PP values are usually <120, occasionally has values in the 120's Does not have her log with her Following w MFM, last growth at 30 weeks w EFW 1942g, 97% Weekly testing due to AMA, GDM, uncertain glucose control  4. Language barrier Spanish  5. ASCUS of cervix with negative high risk HPV NILM 07/2020  6. Multigravida of advanced maternal age in third trimester   7. Calculus of gallbladder without cholecystitis without obstruction Some pain last night, none today Following appropriate diet  Preterm labor symptoms and general obstetric precautions including but not limited to vaginal bleeding, contractions, leaking of fluid and fetal movement were reviewed in detail with the patient.  Return in 2 weeks (on 10/26/2020) for [facility], ob visit, needs MD.  Future Appointments  Date Time Provider Department Center  10/17/2020  7:45 AM [facility] NURSE [facility]  10/17/2020  8:00 AM [facility] US1 [facility]  10/24/2020  7:30 AM [facility] NURSE [facility]  10/24/2020  7:45 AM [facility] US4 [facility]  10/26/2020  8:15 AM Venora Maples, MD [facility]  10/31/2020  7:30 AM [facility] NURSE [facility]  10/31/2020  7:45 AM [facility] US4 [facility]  11/07/2020  7:30 AM [facility] NURSE [facility]  11/07/2020  7:45 AM [facility] US4 [facility]     Time spent on virtual visit: 10 minutes  Venora Maples, MD

## 2020-10-12 NOTE — Progress Notes (Signed)
Called pt @ [phone] with [employer]. And received message that the voicemail box is full and can not leave a message.  Will attempt in 15 minutes.  I connected with  [name] on 10/12/20 at  9:15 AM EST by telephone and verified that I am speaking with the correct person using two identifiers.   I discussed the limitations, risks, security and privacy concerns of performing an evaluation and management service by telephone and the availability of in person appointments. I also discussed with the patient that there may be a patient responsible charge related to this service. The patient expressed understanding and agreed to proceed.  Called pt with [employer] Sanjuana Letters, RN 10/12/2020  10:34 AM Addison Naegeli, RN  10/12/20

## 2020-10-12 NOTE — Patient Instructions (Signed)
 Eleccin del mtodo anticonceptivo Contraception Choices La anticoncepcin, o los mtodos anticonceptivos, hace referencia a los mtodos o dispositivos que evitan el embarazo. Mtodos hormonales Implante anticonceptivo Un implante anticonceptivo consiste en un tubo delgado de plstico que contiene una hormona que evita el embarazo. Es diferente de un dispositivo intrauterino (DIU). Un mdico lo inserta en la parte superior del brazo. Los implantes pueden ser eficaces durante un mximo de 3 aos. Inyecciones de progestina sola Las inyecciones de progestina sola contienen progestina, una forma sinttica de la hormona progesterona. Un mdico las administra cada 3 meses. Pldoras anticonceptivas Las pldoras anticonceptivas son pastillas que contienen hormonas que evitan el embarazo. Deben tomarse una vez al da, preferentemente a la misma hora cada da. Se necesita una receta para utilizar este mtodo anticonceptivo. Parche anticonceptivo El parche anticonceptivo contiene hormonas que evitan el embarazo. Se coloca en la piel, debe cambiarse una vez a la semana durante tres semanas y debe retirarse en la cuarta semana. Se necesita una receta para utilizar este mtodo anticonceptivo. Anillo vaginal Un anillo vaginal contiene hormonas que evitan el embarazo. Se coloca en la vagina durante tres semanas y se retira en la cuarta semana. Luego se repite el proceso con un anillo nuevo. Se necesita una receta para utilizar este mtodo anticonceptivo. Anticonceptivo de emergencia Los anticonceptivos de emergencia son mtodos para evitar un embarazo despus de tener sexo sin proteccin. Vienen en forma de pldora y pueden tomarse hasta 5 das despus de tener sexo. Funcionan mejor cuando se toman lo ms pronto posible luego de tener sexo. La mayora de los anticonceptivos de emergencia estn disponibles sin receta mdica. Este mtodo no debe utilizarse como el nico mtodo anticonceptivo.   Mtodos de  barrera Condn masculino Un condn masculino es una vaina delgada que se coloca sobre el pene durante el sexo. Los condones evitan que el esperma ingrese en el cuerpo de la mujer. Pueden utilizarse con un una sustancia que mata a los espermatozoides (espermicida) para aumentar la efectividad. Deben desecharse despus de un uso. Condn femenino Un condn femenino es una vaina blanda y holgada que se coloca en la vagina antes de tener sexo. El condn evita que el esperma ingrese en el cuerpo de la mujer. Deben desecharse despus de un uso. Diafragma Un diafragma es una barrera blanda con forma de cpula. Se inserta en la vagina antes del sexo, junto con un espermicida. El diafragma bloquea el ingreso de esperma en el tero, y el espermicida mata a los espermatozoides. El diafragma debe permanecer en la vagina durante 6 a 8 horas despus de tener sexo y debe retirarse en el plazo de las 24 horas. Un diafragma es recetado y colocado por un mdico. Debe reemplazarse cada 1 a 2 aos, despus de dar a luz, de aumentar ms de 15lb (6.8kg) y de una ciruga plvica. Capuchn cervical Un capuchn cervical es una copa redonda y blanda de ltex o plstico que se coloca en el cuello uterino. Se inserta en la vagina antes del sexo, junto con un espermicida. Bloquea el ingreso del esperma en el tero. El capuchn debe permanecer en el lugar durante 6 a 8 horas despus de tener sexo y debe retirarse en el plazo de las 48 horas. Un capuchn cervical debe ser recetado y colocado por un mdico. Debe reemplazarse cada 2aos. Esponja Una esponja es una pieza blanda y circular de espuma de poliuretano que contiene espermicida. La esponja ayuda a bloquear el ingreso de esperma en el tero, y el   espermicida mata a los espermatozoides. Para utilizarla, debe humedecerla e insertarla en la vagina. Debe insertarse antes de tener sexo, debe permanecer dentro al menos durante 6 horas despus de tener sexo y debe retirarse y  desecharse en el plazo de las 30 horas. Espermicidas Los espermicidas son sustancias qumicas que matan o bloquean al esperma y no lo dejan ingresar al cuello uterino y al tero. Vienen en forma de crema, gel, supositorio, espuma o comprimido. Un espermicida debe insertarse en la vagina con un aplicador al menos 10 o 15 minutos antes de tener sexo para dar tiempo a que surta efecto. El proceso debe repetirse cada vez que tenga sexo. Los espermicidas no requieren receta mdica.   Anticonceptivos intrauterinos Dispositivo intrauterino (DIU) Un DIU es un dispositivo en forma de T que se coloca en el tero. Existen dos tipos:  DIU hormonal.Este tipo contiene progestina, una forma sinttica de la hormona progesterona. Este tipo puede permanecer colocado durante 3 a 5 aos.  DIU de cobre.Este tipo est recubierto con un alambre de cobre. Puede permanecer colocado durante 10 aos. Mtodos anticonceptivos permanentes Ligadura de trompas en la mujer En este mtodo, se sellan, atan u obstruyen las trompas de Falopio durante una ciruga para evitar que el vulo descienda hacia el tero. Esterilizacin histeroscpica En este mtodo, se coloca un implante pequeo y flexible dentro de cada trompa de Falopio. Los implantes hacen que se forme un tejido cicatricial en las trompas de Falopio y que las obstruya para que el espermatozoide no pueda llegar al vulo. El procedimiento demora alrededor de 3 meses para que sea efectivo. Debe utilizarse otro mtodo anticonceptivo durante esos 3 meses. Esterilizacin masculina Este es un procedimiento que consiste en atar los conductos que transportan el esperma (vasectoma). Luego del procedimiento, el hombre puede eyacular lquido (semen). Debe utilizarse otro mtodo anticonceptivo durante 3 meses despus del procedimiento. Mtodos de planificacin natural Planificacin familiar natural En este mtodo, la pareja no tiene sexo durante los das en que la mujer podra quedar  embarazada. Mtodo calendario En este mtodo, la mujer realiza un seguimiento de la duracin de cada ciclo menstrual, identifica los das en los que se puede producir un embarazo y no tiene sexo durante esos das. Mtodo de la ovulacin En este mtodo, la pareja evita tener sexo durante la ovulacin. Mtodo sintotrmico Este mtodo implica no tener sexo durante la ovulacin. Normalmente, la mujer comprueba la ovulacin al observar cambios en su temperatura y en la consistencia del moco cervical. Mtodo posovulacin En este mtodo, la pareja espera a que finalice la ovulacin para tener sexo. Dnde buscar ms informacin  Centers for Disease Control and Prevention (Centros para el Control y la Prevencin de Enfermedades): www.cdc.gov Resumen  La anticoncepcin, o los mtodos anticonceptivos, hace referencia a los mtodos o dispositivos que evitan el embarazo.  Los mtodos anticonceptivos hormonales incluyen implantes, inyecciones, pastillas, parches, anillos vaginales y anticonceptivos de emergencia.  Los mtodos anticonceptivos de barrera pueden incluir condones masculinos, condones femeninos, diafragmas, capuchones cervicales, esponjas y espermicidas.  Existen dos tipos de DIU (dispositivo intrauterino). Un DIU puede colocarse en el tero de una mujer para evitar el embarazo durante 3 a 5 aos.  La esterilizacin permanente puede realizarse mediante un procedimiento tanto en los hombres como en las mujeres. Los mtodos de planificacin familiar natural implican no tener sexo durante los das en que la mujer podra quedar embarazada. Esta informacin no tiene como fin reemplazar el consejo del mdico. Asegrese de hacerle al mdico cualquier pregunta que   tenga. Document Revised: 03/29/2020 Document Reviewed: 03/29/2020 Elsevier Patient Education  2021 [employer].   Lactancia materna Breastfeeding  Decidir amamantar es una de las mejores elecciones que puede hacer por usted y su  beb. Un cambio en las hormonas durante el embarazo hace que las mamas produzcan leche materna en las glndulas productoras de leche. Las hormonas impiden que la leche materna sea liberada antes del nacimiento del beb. Adems, impulsan el flujo de leche luego del nacimiento. Una vez que ha comenzado a amamantar, pensar en el beb, as como la succin o el llanto, pueden estimular la liberacin de leche de las glndulas productoras de leche. Los beneficios de amamantar Las investigaciones demuestran que la lactancia materna ofrece muchos beneficios de salud para bebs y madres. Adems, ofrece una forma gratuita y conveniente de alimentar al beb. Para el beb  La primera leche (calostro) ayuda a mejorar el funcionamiento del aparato digestivo del beb.  Las clulas especiales de la leche (anticuerpos) ayudan a combatir las infecciones en el beb.  Los bebs que se alimentan con leche materna tambin tienen menos probabilidades de tener asma, alergias, obesidad o diabetes de tipo 2. Adems, tienen menor riesgo de sufrir el sndrome de muerte sbita del lactante (SMSL).  Los nutrientes de la leche materna son mejores para satisfacer las necesidades del beb en comparacin con la leche maternizada.  La leche materna mejora el desarrollo cerebral del beb. Para usted  La lactancia materna favorece el desarrollo de un vnculo muy especial entre la madre y el beb.  Es conveniente. La leche materna es econmica y siempre est disponible a la temperatura correcta.  La lactancia materna ayuda a quemar caloras. Le ayuda a perder el peso ganado durante el embarazo.  Hace que el tero vuelva al tamao que tena antes del embarazo ms rpido. Adems, disminuye el sangrado (loquios) despus del parto.  La lactancia materna contribuye a reducir el riesgo de tener diabetes de tipo 2, osteoporosis, artritis reumatoide, enfermedades cardiovasculares y cncer de mama, ovario, tero y endometrio en el  futuro. Informacin bsica sobre la lactancia Comienzo de la lactancia  Encuentre un lugar cmodo para sentarse o acostarse, con un buen respaldo para el cuello y la espalda.  Coloque una almohada o una manta enrollada debajo del beb para acomodarlo a la altura de la mama (si est sentada). Las almohadas para amamantar se han diseado especialmente a fin de servir de apoyo para los brazos y el beb mientras amamanta.  Asegrese de que la barriga del beb (abdomen) est frente a la suya.  Masajee suavemente la mama. Con las yemas de los dedos, masajee los bordes exteriores de la mama hacia adentro, en direccin al pezn. Esto estimula el flujo de leche. Si la leche fluye lentamente, es posible que deba continuar con este movimiento durante la lactancia.  Sostenga la mama con 4 dedos por debajo y el pulgar por arriba del pezn (forme la letra "C" con la mano). Asegrese de que los dedos se encuentren lejos del pezn y de la boca del beb.  Empuje suavemente los labios del beb con el pezn o con el dedo.  Cuando la boca del beb se abra lo suficiente, acrquelo rpidamente a la mama e introduzca todo el pezn y la arola, tanto como sea posible, dentro de la boca del beb. La arola es la zona de color que rodea al pezn. ? Debe haber ms arola visible por arriba del labio superior del beb que por debajo del labio   inferior. ? Los labios del beb deben estar abiertos y extendidos hacia afuera (evertidos) para asegurar que el beb se prenda de forma adecuada y cmoda. ? La lengua del beb debe estar entre la enca inferior y la mama.  Asegrese de que la boca del beb est en la posicin correcta alrededor del pezn (prendido). Los labios del beb deben crear un sello sobre la mama y estar doblados hacia afuera (invertidos).  Es comn que el beb succione durante 2 a 3 minutos para que comience el flujo de leche materna. Cmo debe prenderse Es muy importante que le ensee al beb cmo  prenderse adecuadamente a la mama. Si el beb no se prende adecuadamente, puede causar dolor en los pezones, reducir la produccin de leche materna y hacer que el beb tenga un escaso aumento de peso. Adems, si el beb no se prende adecuadamente al pezn, puede tragar aire durante la alimentacin. Esto puede causarle molestias al beb. Hacer eructar al beb al cambiar de mama puede ayudarlo a liberar el aire. Sin embargo, ensearle al beb cmo prenderse a la mama adecuadamente es la mejor manera de evitar que se sienta molesto por tragar aire mientras se alimenta. Signos de que el beb se ha prendido adecuadamente al pezn  Tironea o succiona de modo silencioso, sin causarle dolor. Los labios del beb deben estar extendidos hacia afuera (evertidos).  Se escucha que traga cada 3 o 4 succiones una vez que la leche ha comenzado a fluir (despus de que se produzca el reflejo de eyeccin de la leche).  Hay movimientos musculares por arriba y por delante de sus odos al succionar. Signos de que el beb no se ha prendido adecuadamente al pezn  Hace ruidos de succin o de chasquido mientras se alimenta.  Siente dolor en los pezones. Si cree que el beb no se prendi correctamente, deslice el dedo en la comisura de la boca y colquelo entre las encas del beb para interrumpir la succin. Intente volver a comenzar a amamantar. Signos de lactancia materna exitosa Signos del beb  El beb disminuir gradualmente el nmero de succiones o dejar de succionar por completo.  El beb se quedar dormido.  El cuerpo del beb se relajar.  El beb retendr una pequea cantidad de leche en la boca.  El beb se desprender solo del pecho. Signos que presenta usted  Las mamas han aumentado la firmeza, el peso y el tamao 1 a 3 horas despus de amamantar.  Estn ms blandas inmediatamente despus de amamantar.  Se producen un aumento del volumen de leche y un cambio en su consistencia y color hacia el  quinto da de lactancia.  Los pezones no duelen, no estn agrietados ni sangran. Signos de que su beb recibe la cantidad de leche suficiente  Mojar por lo menos 1 o 2paales durante las primeras 24horas despus del nacimiento.  Mojar por lo menos 5 o 6paales cada 24horas durante la primera semana despus del nacimiento. La orina debe ser clara o de color amarillo plido a los 5das de vida.  Mojar entre 6 y 8paales cada 24horas a medida que el beb sigue creciendo y desarrollndose.  Defeca por lo menos 3 veces en 24 horas a los 5 das de vida. Las heces deben ser blandas y amarillentas.  Defeca por lo menos 3 veces en 24 horas a los 7 das de vida. Las heces deben ser grumosas y amarillentas.  No registra una prdida de peso mayor al 10% del peso al   nacer durante los primeros 3 das de vida.  Aumenta de peso un promedio de 4 a 7onzas (113 a 198g) por semana despus de los 4 das de vida.  Aumenta de peso, diariamente, de manera uniforme a partir de los 5 das de vida, sin registrar prdida de peso despus de las 2semanas de vida. Despus de alimentarse, es posible que el beb regurgite una pequea cantidad de leche. Esto es normal. Frecuencia y duracin de la lactancia El amamantamiento frecuente la ayudar a producir ms leche y puede prevenir dolores en los pezones y las mamas extremadamente llenas (congestin mamaria). Alimente al beb cuando muestre signos de hambre o si siente la necesidad de reducir la congestin de las mamas. Esto se denomina "lactancia a demanda". Las seales de que el beb tiene hambre incluyen las siguientes:  Aumento del estado de alerta, actividad o inquietud.  Mueve la cabeza de un lado a otro.  Abre la boca cuando se le toca la mejilla o la comisura de la boca (reflejo de bsqueda).  Aumenta las vocalizaciones, tales como sonidos de succin, se relame los labios, emite arrullos, suspiros o chirridos.  Mueve la mano hacia la boca y se chupa  los dedos o las manos.  Est molesto o llora. Evite el uso del chupete en las primeras 4 a 6 semanas despus del nacimiento del beb. Despus de este perodo, podr usar un chupete. Las investigaciones demostraron que el uso del chupete durante el primer ao de vida del beb disminuye el riesgo de tener el sndrome de muerte sbita del lactante (SMSL). Permita que el nio se alimente en cada mama todo lo que desee. Cuando el beb se desprende o se queda dormido mientras se est alimentando de la primera mama, ofrzcale la segunda. Debido a que, con frecuencia, los recin nacidos estn somnolientos las primeras semanas de vida, es posible que deba despertar al beb para alimentarlo. Los horarios de lactancia varan de un beb a otro. Sin embargo, las siguientes reglas pueden servir como gua para ayudarla a garantizar que el beb se alimenta adecuadamente:  Se puede amamantar a los recin nacidos (bebs de 4 semanas o menos de vida) cada 1 a 3 horas.  No deben transcurrir ms de 3 horas durante el da o 5 horas durante la noche sin que se amamante a los recin nacidos.  Debe amamantar al beb un mnimo de 8 veces en un perodo de 24 horas. Extraccin de leche materna La extraccin y el almacenamiento de la leche materna le permiten asegurarse de que el beb se alimente exclusivamente de su leche materna, aun en momentos en los que no puede amamantar. Esto tiene especial importancia si debe regresar al trabajo en el perodo en que an est amamantando o si no puede estar presente en los momentos en que el beb debe alimentarse. Su asesor en lactancia puede ayudarla a encontrar un mtodo de extraccin que funcione mejor para usted y orientarla sobre cunto tiempo es seguro almacenar leche materna.      Cmo cuidar las mamas durante la lactancia Los pezones pueden secarse, agrietarse y doler durante la lactancia. Las siguientes recomendaciones pueden ayudarla a mantener las mamas humectadas y  sanas:  Evite usar jabn en los pezones.  Use un sostn de soporte diseado especialmente para la lactancia materna. Evite usar sostenes con aro o sostenes muy ajustados (sostenes deportivos).  Seque al aire sus pezones durante 3 a 4minutos despus de amamantar al beb.  Utilice solo apsitos de algodn en   el sostn para absorber las prdidas de leche. La prdida de un poco de leche materna entre las tomas es normal.  Utilice lanolina sobre los pezones luego de amamantar. La lanolina ayuda a mantener la humedad normal de la piel. La lanolina pura no es perjudicial (no es txica) para el beb. Adems, puede extraer manualmente algunas gotas de leche materna y masajear suavemente esa leche sobre los pezones para que la leche se seque al aire. Durante las primeras semanas despus del nacimiento, algunas mujeres experimentan congestin mamaria. La congestin mamaria puede hacer que sienta las mamas pesadas, calientes y sensibles al tacto. El pico de la congestin mamaria ocurre en el plazo de los 3 a 5 das despus del parto. Las siguientes recomendaciones pueden ayudarla a aliviar la congestin mamaria:  Vace por completo las mamas al amamantar o extraer leche. Puede aplicar calor hmedo en las mamas (en la ducha o con toallas hmedas para manos) antes de amamantar o extraer leche. Esto aumenta la circulacin y ayuda a que la leche fluya. Si el beb no vaca por completo las mamas cuando lo amamanta, extraiga la leche restante despus de que haya finalizado.  Aplique compresas de hielo sobre las mamas inmediatamente despus de amamantar o extraer leche, a menos que le resulte demasiado incmodo. Haga lo siguiente: ? Ponga el hielo en una bolsa plstica. ? Coloque una toalla entre la piel y la bolsa de hielo. ? Coloque el hielo durante 20minutos, 2 o 3veces por da.  Asegrese de que el beb est prendido y se encuentre en la posicin correcta mientras lo alimenta. Si la congestin mamaria  persiste luego de 48 horas o despus de seguir estas recomendaciones, comunquese con su mdico o un asesor en lactancia. Recomendaciones de salud general durante la lactancia  Consuma 3 comidas y 3 colaciones saludables todos los das. Las madres bien alimentadas que amamantan necesitan entre 450 y 500 caloras adicionales por da. Puede cumplir con este requisito al aumentar la cantidad de una dieta equilibrada que realice.  Beba suficiente agua para mantener la orina clara o de color amarillo plido.  Descanse con frecuencia, reljese y siga tomando sus vitaminas prenatales para prevenir la fatiga, el estrs y los niveles bajos de vitaminas y minerales en el cuerpo (deficiencias de nutrientes).  No consuma ningn producto que contenga nicotina o tabaco, como cigarrillos y cigarrillos electrnicos. El beb puede verse afectado por las sustancias qumicas de los cigarrillos que pasan a la leche materna y por la exposicin al humo ambiental del tabaco. Si necesita ayuda para dejar de fumar, consulte al mdico.  Evite el consumo de alcohol.  No consuma drogas ilegales o marihuana.  Antes de usar cualquier medicamento, hable con el mdico. Estos incluyen medicamentos recetados y de venta libre, como tambin vitaminas y suplementos a base de hierbas. Algunos medicamentos, que pueden ser perjudiciales para el beb, pueden pasar a travs de la leche materna.  Puede quedar embarazada durante la lactancia. Si se desea un mtodo anticonceptivo, consulte al mdico sobre cules son las opciones seguras durante la lactancia. Dnde encontrar ms informacin: Liga internacional La [place]eche: www.llli.org. Comunquese con un mdico si:  Siente que quiere dejar de amamantar o se siente frustrada con la lactancia.  Sus pezones estn agrietados o sangran.  Sus mamas estn irritadas, sensibles o calientes.  Tiene los siguientes sntomas: ? Dolor en las mamas o en los pezones. ? Un rea hinchada en cualquiera  de las mamas. ? Fiebre o escalofros. ? Nuseas o vmitos. ?   Drenaje de otro lquido distinto de la leche materna desde los pezones.  Sus mamas no se llenan antes de amamantar al beb para el quinto da despus del parto.  Se siente triste y deprimida.  El beb: ? Est demasiado somnoliento como para comer bien. ? Tiene problemas para dormir. ? Tiene ms de 1 semana de vida y moja menos de 6 paales en un periodo de 24 horas. ? No ha aumentado de peso a los 5 das de vida.  El beb defeca menos de 3 veces en 24 horas.  La piel del beb o las partes blancas de los ojos se vuelven amarillentas. Solicite ayuda de inmediato si:  El beb est muy cansado (letargo) y no se quiere despertar para comer.  Le sube la fiebre sin causa. Resumen  La lactancia materna ofrece muchos beneficios de salud para bebs y madres.  Intente amamantar a su beb cuando muestre signos tempranos de hambre.  Haga cosquillas o empuje suavemente los labios del beb con el dedo o el pezn para lograr que el beb abra la boca. Acerque el beb a la mama. Asegrese de que la mayor parte de la arola se encuentre dentro de la boca del beb. Ofrzcale una mama y haga eructar al beb antes de pasar a la otra.  Hable con su mdico o asesor en lactancia si tiene dudas o problemas con la lactancia. Esta informacin no tiene como fin reemplazar el consejo del mdico. Asegrese de hacerle al mdico cualquier pregunta que tenga. Document Revised: 11/21/2017 Document Reviewed: 12/17/2016 Elsevier Patient Education  2021 [employer] Inc.  

## 2020-10-13 ENCOUNTER — Encounter (HOSPITAL_COMMUNITY): Payer: Self-pay | Admitting: Obstetrics & Gynecology

## 2020-10-13 ENCOUNTER — Inpatient Hospital Stay (HOSPITAL_COMMUNITY): Payer: Self-pay

## 2020-10-13 ENCOUNTER — Inpatient Hospital Stay (HOSPITAL_COMMUNITY)
Admission: AD | Admit: 2020-10-13 | Discharge: 2020-10-13 | Disposition: A | Discharge status: Home / Self Care | Payer: Self-pay | Attending: Obstetrics & Gynecology | Admitting: Obstetrics & Gynecology

## 2020-10-13 ENCOUNTER — Other Ambulatory Visit: Payer: Self-pay

## 2020-10-13 DIAGNOSIS — R109 Unspecified abdominal pain: Secondary | ICD-10-CM

## 2020-10-13 DIAGNOSIS — Z79899 Other long term (current) drug therapy: Secondary | ICD-10-CM | POA: Insufficient documentation

## 2020-10-13 DIAGNOSIS — O99613 Diseases of the digestive system complicating pregnancy, third trimester: Secondary | ICD-10-CM

## 2020-10-13 DIAGNOSIS — O99891 Other specified diseases and conditions complicating pregnancy: Secondary | ICD-10-CM

## 2020-10-13 DIAGNOSIS — M549 Dorsalgia, unspecified: Secondary | ICD-10-CM

## 2020-10-13 DIAGNOSIS — Z885 Allergy status to narcotic agent status: Secondary | ICD-10-CM | POA: Insufficient documentation

## 2020-10-13 DIAGNOSIS — O26893 Other specified pregnancy related conditions, third trimester: Secondary | ICD-10-CM | POA: Insufficient documentation

## 2020-10-13 DIAGNOSIS — O26899 Other specified pregnancy related conditions, unspecified trimester: Secondary | ICD-10-CM

## 2020-10-13 DIAGNOSIS — R140 Abdominal distension (gaseous): Secondary | ICD-10-CM

## 2020-10-13 DIAGNOSIS — O09523 Supervision of elderly multigravida, third trimester: Secondary | ICD-10-CM | POA: Insufficient documentation

## 2020-10-13 DIAGNOSIS — K8020 Calculus of gallbladder without cholecystitis without obstruction: Secondary | ICD-10-CM

## 2020-10-13 DIAGNOSIS — K219 Gastro-esophageal reflux disease without esophagitis: Secondary | ICD-10-CM | POA: Insufficient documentation

## 2020-10-13 DIAGNOSIS — Z7982 Long term (current) use of aspirin: Secondary | ICD-10-CM | POA: Insufficient documentation

## 2020-10-13 DIAGNOSIS — Z3A33 33 weeks gestation of pregnancy: Secondary | ICD-10-CM

## 2020-10-13 LAB — CBC WITH DIFFERENTIAL/PLATELET
Abs Immature Granulocytes: 0.09 10*3/uL — ABNORMAL HIGH (ref 0.00–0.07)
Basophils Absolute: 0 10*3/uL (ref 0.0–0.1)
Basophils Relative: 0 %
Eosinophils Absolute: 0.3 10*3/uL (ref 0.0–0.5)
Eosinophils Relative: 3 %
HCT: 29.2 % — ABNORMAL LOW (ref 36.0–46.0)
Hemoglobin: 9.9 g/dL — ABNORMAL LOW (ref 12.0–15.0)
Immature Granulocytes: 1 %
Lymphocytes Relative: 26 %
Lymphs Abs: 2.2 10*3/uL (ref 0.7–4.0)
MCH: 29.6 pg (ref 26.0–34.0)
MCHC: 33.9 g/dL (ref 30.0–36.0)
MCV: 87.4 fL (ref 80.0–100.0)
Monocytes Absolute: 0.6 10*3/uL (ref 0.1–1.0)
Monocytes Relative: 7 %
Neutro Abs: 5.6 10*3/uL (ref 1.7–7.7)
Neutrophils Relative %: 63 %
Platelets: 297 10*3/uL (ref 150–400)
RBC: 3.34 MIL/uL — ABNORMAL LOW (ref 3.87–5.11)
RDW: 13.5 % (ref 11.5–15.5)
WBC: 8.8 10*3/uL (ref 4.0–10.5)
nRBC: 0 % (ref 0.0–0.2)

## 2020-10-13 LAB — LIPASE, BLOOD: Lipase: 24 U/L (ref 11–51)

## 2020-10-13 LAB — COMPREHENSIVE METABOLIC PANEL
ALT: 19 U/L (ref 0–44)
AST: 20 U/L (ref 15–41)
Albumin: 2.6 g/dL — ABNORMAL LOW (ref 3.5–5.0)
Alkaline Phosphatase: 210 U/L — ABNORMAL HIGH (ref 38–126)
Anion gap: 11 (ref 5–15)
BUN: 5 mg/dL — ABNORMAL LOW (ref 6–20)
CO2: 20 mmol/L — ABNORMAL LOW (ref 22–32)
Calcium: 8.9 mg/dL (ref 8.9–10.3)
Chloride: 103 mmol/L (ref 98–111)
Creatinine, Ser: 0.47 mg/dL (ref 0.44–1.00)
GFR, Estimated: >60 mL/min (ref >60)
Glucose, Bld: 127 mg/dL — ABNORMAL HIGH (ref 70–99)
Potassium: 3.6 mmol/L (ref 3.5–5.1)
Sodium: 134 mmol/L — ABNORMAL LOW (ref 135–145)
Total Bilirubin: 0.5 mg/dL (ref 0.3–1.2)
Total Protein: 6.4 g/dL — ABNORMAL LOW (ref 6.5–8.1)

## 2020-10-13 LAB — AMYLASE: Amylase: 60 U/L (ref 28–100)

## 2020-10-13 MED ORDER — HYOSCYAMINE SULFATE SL 0.125 MG SL SUBL: 0.1250 mg | SUBLINGUAL_TABLET | Freq: Two times a day (BID) | SUBLINGUAL | 0 refills | Status: DC | PRN

## 2020-10-13 MED ORDER — PANTOPRAZOLE SODIUM 20 MG PO TBEC: 20.0000 mg | DELAYED_RELEASE_TABLET | Freq: Every day | ORAL | 0 refills | Status: DC

## 2020-10-13 MED ORDER — HYOSCYAMINE SULFATE 0.125 MG SL SUBL
0.2500 mg | SUBLINGUAL_TABLET | Freq: Once | SUBLINGUAL | Status: AC
  Administered 2020-10-13: 0.25 mg via SUBLINGUAL
  Filled 2020-10-13: qty 2

## 2020-10-13 MED ORDER — SIMETHICONE 80 MG PO CHEW
160.0000 mg | CHEWABLE_TABLET | Freq: Once | ORAL | Status: AC
  Administered 2020-10-13: 160 mg via ORAL
  Filled 2020-10-13: qty 2

## 2020-10-13 NOTE — MAU Provider Note (Signed)
Chief Complaint:  Abdominal Pain   Event Date/Time   First Provider Initiated Contact with Patient 10/13/20 0135     HPI: [name] is a 40 y.o. [id number] at 65w3dwho presents to maternity admissions reporting abdominal and back pain.  States is located across top half of abdomen and right mid-abdomen.  Does not really endorse gallbladder location specifically.  Points to other areas. .+ N/V and gas distention.   She reports good fetal movement, denies LOF, vaginal bleeding, vaginal itching/burning, urinary symptoms, h/a, dizziness, diarrhea, constipation or fever/chills.    Abdominal Pain This is a recurrent problem. The current episode started today. The onset quality is gradual. The problem occurs intermittently. The problem has been unchanged. The pain is located in the LUQ, RUQ and generalized abdominal region (right middle abdomen). The quality of the pain is cramping and colicky. The abdominal pain radiates to the back. Pertinent negatives include no constipation, diarrhea, dysuria, fever, frequency, headaches, myalgias, nausea or vomiting. Nothing aggravates the pain. The pain is relieved by nothing. She has tried nothing for the symptoms.    RN Note: Pt presents to MAU for worsening generalized abdominal and back pain beginning around 23:30 pm this evening.  Pt states that she has had gallstones before and this pain "feels the same as last time".  Pt denies LOF, vaginal bleeding or discharge.  Pt reports decreased fetal movement.  Pt also c/o of vomiting and nausea beginning when the abdominal pain began this evening.   Past Medical History: Past Medical History:  Diagnosis Date  . Anxiety    relative to circumstances to back problems/injury, not taking any medicine, pt. reports has been crying a lot  . Arthritis    low back  . Asthma   . Back pain   . Cholelithiasis 04/08/2020  . Depression   . GERD (gastroesophageal reflux disease)   . Migraines   . Neck pain     Past  obstetric history: OB History  Gravida Para Term Preterm AB Living  9 5 5  0 3 5  SAB IAB Ectopic Multiple Live Births  0 2 1 0 5    # Outcome Date GA Lbr Len/2nd Weight Sex Delivery Anes PTL Lv  9 Current           8 Term 12/27/18 [redacted]w[redacted]d 08:45 / 00:19 3980 g M Vag-Spont EPI  LIV  7 Ectopic 11/2017 [redacted]w[redacted]d         6 IAB 2016 [redacted]w[redacted]d         5 IAB 2011 [redacted]w[redacted]d         4 Term 01/08/09 [redacted]w[redacted]d  3175 g F Vag-Spont EPI N LIV  3 Term 04/23/07 [redacted]w[redacted]d  3175 g M Vag-Spont EPI  LIV  2 Term 04/10/02 [redacted]w[redacted]d  3175 g F Vag-Spont EPI  LIV  1 Term 03/30/98 [redacted]w[redacted]d  3629 g M Vag-Spont EPI N LIV    Past Surgical History: Past Surgical History:  Procedure Laterality Date  . childbirth     x4 vaginal   . LUMBAR LAMINECTOMY/DECOMPRESSION MICRODISCECTOMY  06/05/2012   Procedure: LUMBAR LAMINECTOMY/DECOMPRESSION MICRODISCECTOMY;  Surgeon: 06/07/2012, MD;  Location: [facility] OR;  Service: Orthopedics;  Laterality: Left;  Left sided lumbar 4-5 microdisectomy    Family History: Family History  Problem Relation Age of Onset  . Asthma Brother     Social History: Social History   Tobacco Use  . Smoking status: Never Smoker  . Smokeless tobacco: Never Used  Vaping Use  . Vaping  Use: Never used  Substance Use Topics  . Alcohol use: No  . Drug use: No    Allergies:  Allergies  Allergen Reactions  . Hydrocodone-Acetaminophen Itching    Has some itching on face & nose but not bad enough to stop taking it/ MD aware and instructed patient to take anti-allergy medication prn.    Meds:  Medications Prior to Admission  Medication Sig Dispense Refill Last Dose  . acetaminophen (TYLENOL) 500 MG tablet Take 500 mg by mouth every 6 (six) hours as needed.   10/12/2020 at Unknown time  . aspirin EC 81 MG tablet Take 1 tablet (81 mg total) by mouth daily. Take after 12 weeks for prevention of preeclampsia later in pregnancy 300 tablet 2 10/12/2020 at Unknown time  . Prenatal Vit-Fe Fumarate-FA (PRENATAL MULTIVITAMIN)  TABS tablet Take 1 tablet by mouth daily at 12 noon.   10/12/2020 at Unknown time  . albuterol (ACCUNEB) 0.63 MG/3ML nebulizer solution Take 1 ampule by nebulization every 6 (six) hours as needed for wheezing. (Patient not taking: Reported on 10/12/2020)     . ferrous sulfate 325 (65 FE) MG tablet Take 325 mg by mouth daily with breakfast.     . loratadine (CLARITIN) 10 MG tablet Take 10 mg by mouth daily. (Patient not taking: No sig reported)     . ondansetron (ZOFRAN ODT) 4 MG disintegrating tablet Take 1 tablet (4 mg total) by mouth every 6 (six) hours as needed. (Patient not taking: No sig reported) 20 tablet 0   . simethicone (GAS-X) 80 MG chewable tablet Chew 1 tablet (80 mg total) by mouth every 6 (six) hours as needed for flatulence. (Patient not taking: No sig reported) 30 tablet 0     I have reviewed patient's Past Medical Hx, Surgical Hx, Family Hx, Social Hx, medications and allergies.   ROS:  Review of Systems  Constitutional: Negative for fever.  Gastrointestinal: Positive for abdominal pain. Negative for constipation, diarrhea, nausea and vomiting.  Genitourinary: Negative for dysuria and frequency.  Musculoskeletal: Negative for myalgias.  Neurological: Negative for headaches.   Other systems negative  Physical Exam   Patient Vitals for the past 24 hrs:  BP Temp Temp src Pulse Resp SpO2 Height Weight  10/13/20 0125 117/79 97.7 F (36.5 C) Oral 100 (!) 24 100 % 5\' 2"  (1.575 m) 101.9 kg   Constitutional: Well-developed, well-nourished female in no acute distress, but quite uncomfortable. .  Cardiovascular: normal rate and rhythm Respiratory: normal effort, clear to auscultation bilaterally GI: Abd soft, tender over entire abdomen, mostly upper and right middle abdomen, gravid appropriate for gestational age.   No rebound or guarding. MS: Extremities nontender, no edema, normal ROM Neurologic: Alert and oriented x 4.  GU: Neg CVAT.  PELVIC EXAM: deferred   FHT:  Baseline  135 , moderate variability, accelerations present, no decelerations Contractions: intermittent irritability   Labs: Results for orders placed or performed during the hospital encounter of 10/13/20 (from the past 24 hour(s))  Comprehensive metabolic panel     Status: Abnormal   Collection Time: 10/13/20  1:40 AM  Result Value Ref Range   Sodium 134 (L) 135 - 145 mmol/L   Potassium 3.6 3.5 - 5.1 mmol/L   Chloride 103 98 - 111 mmol/L   CO2 20 (L) 22 - 32 mmol/L   Glucose, Bld 127 (H) 70 - 99 mg/dL   BUN 5 (L) 6 - 20 mg/dL   Creatinine, Ser 12/11/20 0.44 - 1.00 mg/dL  Calcium 8.9 8.9 - 10.3 mg/dL   Total Protein 6.4 (L) 6.5 - 8.1 g/dL   Albumin 2.6 (L) 3.5 - 5.0 g/dL   AST 20 15 - 41 U/L   ALT 19 0 - 44 U/L   Alkaline Phosphatase 210 (H) 38 - 126 U/L   Total Bilirubin 0.5 0.3 - 1.2 mg/dL   GFR, Estimated >70 >62 mL/min   Anion gap 11 5 - 15  Lipase, blood     Status: None   Collection Time: 10/13/20  1:40 AM  Result Value Ref Range   Lipase 24 11 - 51 U/L  Amylase     Status: None   Collection Time: 10/13/20  1:40 AM  Result Value Ref Range   Amylase 60 28 - 100 U/L  CBC with Differential/Platelet     Status: Abnormal   Collection Time: 10/13/20  1:41 AM  Result Value Ref Range   WBC 8.8 4.0 - 10.5 K/uL   RBC 3.34 (L) 3.87 - 5.11 MIL/uL   Hemoglobin 9.9 (L) 12.0 - 15.0 g/dL   HCT 37.6 (L) 28.3 - 15.1 %   MCV 87.4 80.0 - 100.0 fL   MCH 29.6 26.0 - 34.0 pg   MCHC 33.9 30.0 - 36.0 g/dL   RDW 76.1 60.7 - 37.1 %   Platelets 297 150 - 400 K/uL   nRBC 0.0 0.0 - 0.2 %   Neutrophils Relative % 63 %   Neutro Abs 5.6 1.7 - 7.7 K/uL   Lymphocytes Relative 26 %   Lymphs Abs 2.2 0.7 - 4.0 K/uL   Monocytes Relative 7 %   Monocytes Absolute 0.6 0.1 - 1.0 K/uL   Eosinophils Relative 3 %   Eosinophils Absolute 0.3 0.0 - 0.5 K/uL   Basophils Relative 0 %   Basophils Absolute 0.0 0.0 - 0.1 K/uL   Immature Granulocytes 1 %   Abs Immature Granulocytes 0.09 (H) 0.00 - 0.07 K/uL     O/Positive/-- (11/03 0000)  Imaging:  US ABDOMEN LIMITED RUQ (LIVER/GB)  Result Date: 10/13/2020 CLINICAL DATA:  Sudden onset pain EXAM: ULTRASOUND ABDOMEN LIMITED RIGHT UPPER QUADRANT COMPARISON:  None. FINDINGS: Gallbladder: Layering shadowing echogenic calcifications seen measuring 2 cm. No sonographic Murphy sign noted by sonographer. Common bile duct: Diameter: Liver: No focal lesion identified. Within normal limits in parenchymal echogenicity. Portal vein is patent on color Doppler imaging with normal direction of blood flow towards the liver. Other: None. IMPRESSION: Cholelithiasis without evidence of acute cholecystitis Electronically Signed   By: Jonna Clark M.D.   On: 10/13/2020 02:31     MAU Course/MDM: I have ordered labs and reviewed results NST reviewed, reassuring Lab results delayed, reviewed later and deemed normal  [place] results reviewed, no new cholecystitis Treatments in MAU included Levsin, simethicone which did relieve pain somewhat..   Suspect most of this pain is abdominal gas and bloating.   Assessment: Single IUP t [redacted]w[redacted]d Abdominal pain with bloating Known cholelithiasis, no evidence for cholecystitis  Plan: Discharge home Low fat diet Rx Levsin, has rx for simethicone Rx Protonix for GERD PretermLabor precautions and fetal kick counts Follow up in Office for prenatal visits   Encouraged to return if she develops worsening of symptoms, increase in pain, fever, or other concerning symptoms.  Pt stable at time of discharge.  Wynelle Bourgeois CNM, MSN Certified Nurse-Midwife 10/13/2020 1:35 AM

## 2020-10-13 NOTE — Discharge Instructions (Signed)
Food Choices for Gastroesophageal Reflux Disease, Adult When you have gastroesophageal reflux disease (GERD), the foods you eat and your eating habits are very important. Choosing the right foods can help ease your discomfort. Think about working with a food expert (dietitian) to help you make good choices. What are tips for following this plan? Reading food labels  Look for foods that are low in saturated fat. Foods that may help with your symptoms include: ? Foods that have less than 5% of daily value (DV) of fat. ? Foods that have 0 grams of trans fat. Cooking  Do not fry your food.  Cook your food by baking, steaming, grilling, or broiling. These are all methods that do not need a lot of fat for cooking.  To add flavor, try to use herbs that are low in spice and acidity. Meal planning  Choose healthy foods that are low in fat, such as: ? Fruits and vegetables. ? Whole grains. ? Low-fat dairy products. ? Lean meats, fish, and poultry.  Eat small meals often instead of eating 3 large meals each day. Eat your meals slowly in a place where you are relaxed. Avoid bending over or lying down until 2-3 hours after eating.  Limit high-fat foods such as fatty meats or fried foods.  Limit your intake of fatty foods, such as oils, butter, and shortening.  Avoid the following as told by your doctor: ? Foods that cause symptoms. These may be different for different people. Keep a food diary to keep track of foods that cause symptoms. ? Alcohol. ? Drinking a lot of liquid with meals. ? Eating meals during the 2-3 hours before bed.   Lifestyle  Stay at a healthy weight. Ask your doctor what weight is healthy for you. If you need to lose weight, work with your doctor to do so safely.  Exercise for at least 30 minutes on 5 or more days each week, or as told by your doctor.  Wear loose-fitting clothes.  Do not smoke or use any products that contain nicotine or tobacco. If you need help  quitting, ask your doctor.  Sleep with the head of your bed higher than your feet. Use a wedge under the mattress or blocks under the bed frame to raise the head of the bed.  Chew sugar-free gum after meals. What foods should eat? Eat a healthy, well-balanced diet of fruits, vegetables, whole grains, low-fat dairy products, lean meats, fish, and poultry. Each person is different. Foods that may cause symptoms in one person may not cause any symptoms in another person. Work with your doctor to find foods that are safe for you. The items listed above may not be a complete list of what you can eat and drink. Contact a food expert for more options.   What foods should I avoid? Limiting some of these foods may help in managing the symptoms of GERD. Everyone is different. Talk with a food expert or your doctor to help you find the exact foods to avoid, if any. Fruits Any fruits prepared with added fat. Any fruits that cause symptoms. For some people, this may include citrus fruits, such as oranges, grapefruit, pineapple, and lemons. Vegetables Deep-fried vegetables. French fries. Any vegetables prepared with added fat. Any vegetables that cause symptoms. For some people, this may include tomatoes and tomato products, chili peppers, onions and garlic, and horseradish. Grains Pastries or quick breads with added fat. Meats and other proteins High-fat meats, such as fatty beef or pork,   hot dogs, ribs, ham, sausage, salami, and bacon. Fried meat or protein, including fried fish and fried chicken. Nuts and nut butters, in large amounts. Dairy Whole milk and chocolate milk. Sour cream. Cream. Ice cream. Cream cheese. Milkshakes. Fats and oils Butter. Margarine. Shortening. Ghee. Beverages Coffee and tea, with or without caffeine. Carbonated beverages. Sodas. Energy drinks. Fruit juice made with acidic fruits, such as orange or grapefruit. Tomato juice. Alcoholic drinks. Sweets and desserts Chocolate and  cocoa. Donuts. Seasonings and condiments Pepper. Peppermint and spearmint. Added salt. Any condiments, herbs, or seasonings that cause symptoms. For some people, this may include curry, hot sauce, or vinegar-based salad dressings. The items listed above may not be a complete list of what you should not eat and drink. Contact a food expert for more options. Questions to ask your doctor Diet and lifestyle changes are often the first steps that are taken to manage symptoms of GERD. If diet and lifestyle changes do not help, talk with your doctor about taking medicines. Where to find more information  International Foundation for Gastrointestinal Disorders: aboutgerd.org Summary  When you have GERD, food and lifestyle choices are very important in easing your symptoms.  Eat small meals often instead of 3 large meals a day. Eat your meals slowly and in a place where you are relaxed.  Avoid bending over or lying down until 2-3 hours after eating.  Limit high-fat foods such as fatty meats or fried foods. This information is not intended to replace advice given to you by your health care provider. Make sure you discuss any questions you have with your health care provider. Document Revised: 03/07/2020 Document Reviewed: 03/07/2020 Elsevier Patient Education  2021 Elsevier Inc. Abdominal Bloating When you have abdominal bloating, your abdomen may feel full, tight, or painful. It may also look bigger than normal or swollen (distended). Common causes of abdominal bloating include:  Swallowing air.  Constipation.  Problems digesting food.  Eating too much.  Irritable bowel syndrome. This is a condition that affects the large intestine.  Lactose intolerance. This is an inability to digest lactose, a natural sugar in dairy products.  Celiac disease. This is a condition that affects the ability to digest gluten, a protein found in some grains.  Gastroparesis. This is a condition that slows  down the movement of food in the stomach and small intestine. It is more common in people with diabetes mellitus.  Gastroesophageal reflux disease (GERD). This is a digestive condition that makes stomach acid flow back into the esophagus.  Urinary retention. This means that the body is holding onto urine, and the bladder cannot be emptied all the way. Follow these instructions at home: Eating and drinking  Avoid eating too much.  Try not to swallow air while talking or eating.  Avoid eating while lying down.  Avoid these foods and drinks: ? Foods that cause gas, such as broccoli, cabbage, cauliflower, and baked beans. ? Carbonated drinks. ? Hard candy. ? Chewing gum. Medicines  Take over-the-counter and prescription medicines only as told by your health care provider.  Take probiotic medicines. These medicines contain live bacteria or yeasts that can help digestion.  Take coated peppermint oil capsules. Activity  Try to exercise regularly. Exercise may help to relieve bloating that is caused by gas and relieve constipation. General instructions  Keep all follow-up visits as told by your health care provider. This is important. Contact a health care provider if:  You have nausea and vomiting.  You have diarrhea.  You have abdominal pain.  You have unusual weight loss or weight gain.  You have severe pain, and medicines do not help. Get help right away if:  You have severe chest pain.  You have trouble breathing.  You have shortness of breath.  You have trouble urinating.  You have darker urine than normal.  You have blood in your stools or have dark, tarry stools. Summary  Abdominal bloating means that the abdomen is swollen.  Common causes of abdominal bloating are swallowing air, constipation, and problems digesting food.  Avoid eating too much and avoid swallowing air.  Avoid foods that cause gas, carbonated drinks, hard candy, and chewing gum. This  information is not intended to replace advice given to you by your health care provider. Make sure you discuss any questions you have with your health care provider. Document Revised: 12/15/2018 Document Reviewed: 09/28/2016 Elsevier Patient Education  2021 [employer].

## 2020-10-13 NOTE — MAU Note (Signed)
Pt presents to MAU for worsening generalized abdominal and back pain beginning around 23:30 pm this evening.  Pt states that she has had gallstones before and this pain "feels the same as last time".  Pt denies LOF, vaginal bleeding or discharge.  Pt reports decreased fetal movement.  Pt also c/o of vomiting and nausea beginning when the abdominal pain began this evening.

## 2020-10-14 ENCOUNTER — Telehealth: Payer: Self-pay | Admitting: Family Medicine

## 2020-10-14 DIAGNOSIS — O26643 Intrahepatic cholestasis of pregnancy, third trimester: Secondary | ICD-10-CM

## 2020-10-14 DIAGNOSIS — O26649 Intrahepatic cholestasis of pregnancy, unspecified trimester: Secondary | ICD-10-CM | POA: Insufficient documentation

## 2020-10-14 DIAGNOSIS — K831 Obstruction of bile duct: Secondary | ICD-10-CM

## 2020-10-14 DIAGNOSIS — O26619 Liver and biliary tract disorders in pregnancy, unspecified trimester: Secondary | ICD-10-CM | POA: Insufficient documentation

## 2020-10-14 LAB — BILE ACIDS, TOTAL: Bile Acids Total: 18.6 umol/L — ABNORMAL HIGH (ref 0.0–10.0)

## 2020-10-14 NOTE — Telephone Encounter (Signed)
-----   Message from Aviva Signs, CNM sent at 10/14/2020 10:22 AM EST ----- Regarding: KL:TYVD acids Can you look at this chart?    Added the bile acids on a hunch  I'm off till Tuesday  Marie ----- Message ----- From: Leory Plowman, Lab In [place] Sent: 10/14/2020   2:35 AM EST To: Aviva Signs, CNM

## 2020-10-14 NOTE — Telephone Encounter (Signed)
Notifed by [name] after she had a MAU visit last night that she had checked bile acids that had returned elevated. Patient already receiving weekly antenatal testing for GDM, AMA, obesity. Now requires follow up growth as well as IOL at 37 weeks.  Please call patient and let her know the result of this test, that we will need to do increased antenatal testing, and that she will be induced at 37 weeks. We can address this in more detail at her next visit.

## 2020-10-17 ENCOUNTER — Encounter: Payer: Self-pay | Admitting: *Deleted

## 2020-10-17 ENCOUNTER — Ambulatory Visit: Payer: Self-pay | Admitting: *Deleted

## 2020-10-17 ENCOUNTER — Telehealth: Payer: Self-pay

## 2020-10-17 ENCOUNTER — Other Ambulatory Visit: Payer: Self-pay

## 2020-10-17 ENCOUNTER — Ambulatory Visit: Payer: Self-pay | Attending: Obstetrics and Gynecology

## 2020-10-17 DIAGNOSIS — O24419 Gestational diabetes mellitus in pregnancy, unspecified control: Secondary | ICD-10-CM | POA: Insufficient documentation

## 2020-10-17 DIAGNOSIS — O99210 Obesity complicating pregnancy, unspecified trimester: Secondary | ICD-10-CM | POA: Insufficient documentation

## 2020-10-17 DIAGNOSIS — Z362 Encounter for other antenatal screening follow-up: Secondary | ICD-10-CM

## 2020-10-17 DIAGNOSIS — O26613 Liver and biliary tract disorders in pregnancy, third trimester: Secondary | ICD-10-CM

## 2020-10-17 DIAGNOSIS — O24410 Gestational diabetes mellitus in pregnancy, diet controlled: Secondary | ICD-10-CM | POA: Insufficient documentation

## 2020-10-17 DIAGNOSIS — K839 Disease of biliary tract, unspecified: Secondary | ICD-10-CM

## 2020-10-17 DIAGNOSIS — O403XX0 Polyhydramnios, third trimester, not applicable or unspecified: Secondary | ICD-10-CM

## 2020-10-17 DIAGNOSIS — O0933 Supervision of pregnancy with insufficient antenatal care, third trimester: Secondary | ICD-10-CM

## 2020-10-17 DIAGNOSIS — O0990 Supervision of high risk pregnancy, unspecified, unspecified trimester: Secondary | ICD-10-CM | POA: Insufficient documentation

## 2020-10-17 DIAGNOSIS — O321XX0 Maternal care for breech presentation, not applicable or unspecified: Secondary | ICD-10-CM

## 2020-10-17 DIAGNOSIS — O09523 Supervision of elderly multigravida, third trimester: Secondary | ICD-10-CM

## 2020-10-17 DIAGNOSIS — Z3A34 34 weeks gestation of pregnancy: Secondary | ICD-10-CM

## 2020-10-17 NOTE — Telephone Encounter (Signed)
Created GFE for all upcoming appointments. Made it available to pt via MyChart.

## 2020-10-18 NOTE — Telephone Encounter (Addendum)
Called pt with [facility] Interpreter # [phone] and was unable to leave message due to voicemail box full.    Attempted to call pt again unable to reach.    Addison Naegeli, RN

## 2020-10-24 ENCOUNTER — Encounter: Payer: Self-pay | Admitting: *Deleted

## 2020-10-24 ENCOUNTER — Other Ambulatory Visit: Payer: Self-pay

## 2020-10-24 ENCOUNTER — Ambulatory Visit: Payer: Self-pay | Attending: Obstetrics

## 2020-10-24 ENCOUNTER — Ambulatory Visit: Payer: Self-pay | Admitting: *Deleted

## 2020-10-24 DIAGNOSIS — O0990 Supervision of high risk pregnancy, unspecified, unspecified trimester: Secondary | ICD-10-CM

## 2020-10-24 DIAGNOSIS — O24419 Gestational diabetes mellitus in pregnancy, unspecified control: Secondary | ICD-10-CM

## 2020-10-24 DIAGNOSIS — O24410 Gestational diabetes mellitus in pregnancy, diet controlled: Secondary | ICD-10-CM | POA: Insufficient documentation

## 2020-10-24 DIAGNOSIS — O99210 Obesity complicating pregnancy, unspecified trimester: Secondary | ICD-10-CM | POA: Insufficient documentation

## 2020-10-26 ENCOUNTER — Encounter: Payer: Self-pay | Admitting: Family Medicine

## 2020-10-26 ENCOUNTER — Other Ambulatory Visit: Payer: Self-pay

## 2020-10-26 ENCOUNTER — Ambulatory Visit (INDEPENDENT_AMBULATORY_CARE_PROVIDER_SITE_OTHER): Payer: Self-pay | Admitting: Family Medicine

## 2020-10-26 ENCOUNTER — Other Ambulatory Visit (HOSPITAL_COMMUNITY)
Admission: RE | Admit: 2020-10-26 | Discharge: 2020-10-26 | Disposition: A | Discharge status: Home / Self Care | Payer: Self-pay | Source: Ambulatory Visit | Attending: Family Medicine | Admitting: Family Medicine

## 2020-10-26 VITALS — BP 103/71 | HR 88 | Wt 229.2 lb

## 2020-10-26 DIAGNOSIS — O0990 Supervision of high risk pregnancy, unspecified, unspecified trimester: Secondary | ICD-10-CM

## 2020-10-26 DIAGNOSIS — O26643 Intrahepatic cholestasis of pregnancy, third trimester: Secondary | ICD-10-CM

## 2020-10-26 DIAGNOSIS — Z603 Acculturation difficulty: Secondary | ICD-10-CM

## 2020-10-26 DIAGNOSIS — K831 Obstruction of bile duct: Secondary | ICD-10-CM

## 2020-10-26 DIAGNOSIS — O24419 Gestational diabetes mellitus in pregnancy, unspecified control: Secondary | ICD-10-CM

## 2020-10-26 DIAGNOSIS — O09523 Supervision of elderly multigravida, third trimester: Secondary | ICD-10-CM

## 2020-10-26 DIAGNOSIS — Z789 Other specified health status: Secondary | ICD-10-CM

## 2020-10-26 DIAGNOSIS — R87610 Atypical squamous cells of undetermined significance on cytologic smear of cervix (ASC-US): Secondary | ICD-10-CM

## 2020-10-26 DIAGNOSIS — O26613 Liver and biliary tract disorders in pregnancy, third trimester: Secondary | ICD-10-CM

## 2020-10-26 DIAGNOSIS — K8020 Calculus of gallbladder without cholecystitis without obstruction: Secondary | ICD-10-CM

## 2020-10-26 LAB — POCT URINALYSIS DIP (DEVICE)
Bilirubin Urine: NEGATIVE
Glucose, UA: NEGATIVE mg/dL
Hgb urine dipstick: NEGATIVE
Ketones, ur: NEGATIVE mg/dL
Leukocytes,Ua: NEGATIVE
Nitrite: NEGATIVE
Protein, ur: NEGATIVE mg/dL
Specific Gravity, Urine: 1.025 (ref 1.005–1.030)
Urobilinogen, UA: 0.2 mg/dL (ref 0.0–1.0)
pH: 6.5 (ref 5.0–8.0)

## 2020-10-26 MED ORDER — METFORMIN HCL 500 MG PO TABS: 500.0000 mg | ORAL_TABLET | Freq: Two times a day (BID) | ORAL | 1 refills | Status: DC

## 2020-10-26 NOTE — Patient Instructions (Signed)
 Eleccin del mtodo anticonceptivo Contraception Choices La anticoncepcin, o los mtodos anticonceptivos, hace referencia a los mtodos o dispositivos que evitan el embarazo. Mtodos hormonales Implante anticonceptivo Un implante anticonceptivo consiste en un tubo delgado de plstico que contiene una hormona que evita el embarazo. Es diferente de un dispositivo intrauterino (DIU). Un mdico lo inserta en la parte superior del brazo. Los implantes pueden ser eficaces durante un mximo de 3 aos. Inyecciones de progestina sola Las inyecciones de progestina sola contienen progestina, una forma sinttica de la hormona progesterona. Un mdico las administra cada 3 meses. Pldoras anticonceptivas Las pldoras anticonceptivas son pastillas que contienen hormonas que evitan el embarazo. Deben tomarse una vez al da, preferentemente a la misma hora cada da. Se necesita una receta para utilizar este mtodo anticonceptivo. Parche anticonceptivo El parche anticonceptivo contiene hormonas que evitan el embarazo. Se coloca en la piel, debe cambiarse una vez a la semana durante tres semanas y debe retirarse en la cuarta semana. Se necesita una receta para utilizar este mtodo anticonceptivo. Anillo vaginal Un anillo vaginal contiene hormonas que evitan el embarazo. Se coloca en la vagina durante tres semanas y se retira en la cuarta semana. Luego se repite el proceso con un anillo nuevo. Se necesita una receta para utilizar este mtodo anticonceptivo. Anticonceptivo de emergencia Los anticonceptivos de emergencia son mtodos para evitar un embarazo despus de tener sexo sin proteccin. Vienen en forma de pldora y pueden tomarse hasta 5 das despus de tener sexo. Funcionan mejor cuando se toman lo ms pronto posible luego de tener sexo. La mayora de los anticonceptivos de emergencia estn disponibles sin receta mdica. Este mtodo no debe utilizarse como el nico mtodo anticonceptivo.   Mtodos de  barrera Condn masculino Un condn masculino es una vaina delgada que se coloca sobre el pene durante el sexo. Los condones evitan que el esperma ingrese en el cuerpo de la mujer. Pueden utilizarse con un una sustancia que mata a los espermatozoides (espermicida) para aumentar la efectividad. Deben desecharse despus de un uso. Condn femenino Un condn femenino es una vaina blanda y holgada que se coloca en la vagina antes de tener sexo. El condn evita que el esperma ingrese en el cuerpo de la mujer. Deben desecharse despus de un uso. Diafragma Un diafragma es una barrera blanda con forma de cpula. Se inserta en la vagina antes del sexo, junto con un espermicida. El diafragma bloquea el ingreso de esperma en el tero, y el espermicida mata a los espermatozoides. El diafragma debe permanecer en la vagina durante 6 a 8 horas despus de tener sexo y debe retirarse en el plazo de las 24 horas. Un diafragma es recetado y colocado por un mdico. Debe reemplazarse cada 1 a 2 aos, despus de dar a luz, de aumentar ms de 15lb (6.8kg) y de una ciruga plvica. Capuchn cervical Un capuchn cervical es una copa redonda y blanda de ltex o plstico que se coloca en el cuello uterino. Se inserta en la vagina antes del sexo, junto con un espermicida. Bloquea el ingreso del esperma en el tero. El capuchn debe permanecer en el lugar durante 6 a 8 horas despus de tener sexo y debe retirarse en el plazo de las 48 horas. Un capuchn cervical debe ser recetado y colocado por un mdico. Debe reemplazarse cada 2aos. Esponja Una esponja es una pieza blanda y circular de espuma de poliuretano que contiene espermicida. La esponja ayuda a bloquear el ingreso de esperma en el tero, y el   espermicida mata a los espermatozoides. Para utilizarla, debe humedecerla e insertarla en la vagina. Debe insertarse antes de tener sexo, debe permanecer dentro al menos durante 6 horas despus de tener sexo y debe retirarse y  desecharse en el plazo de las 30 horas. Espermicidas Los espermicidas son sustancias qumicas que matan o bloquean al esperma y no lo dejan ingresar al cuello uterino y al tero. Vienen en forma de crema, gel, supositorio, espuma o comprimido. Un espermicida debe insertarse en la vagina con un aplicador al menos 10 o 15 minutos antes de tener sexo para dar tiempo a que surta efecto. El proceso debe repetirse cada vez que tenga sexo. Los espermicidas no requieren receta mdica.   Anticonceptivos intrauterinos Dispositivo intrauterino (DIU) Un DIU es un dispositivo en forma de T que se coloca en el tero. Existen dos tipos:  DIU hormonal.Este tipo contiene progestina, una forma sinttica de la hormona progesterona. Este tipo puede permanecer colocado durante 3 a 5 aos.  DIU de cobre.Este tipo est recubierto con un alambre de cobre. Puede permanecer colocado durante 10 aos. Mtodos anticonceptivos permanentes Ligadura de trompas en la mujer En este mtodo, se sellan, atan u obstruyen las trompas de Falopio durante una ciruga para evitar que el vulo descienda hacia el tero. Esterilizacin histeroscpica En este mtodo, se coloca un implante pequeo y flexible dentro de cada trompa de Falopio. Los implantes hacen que se forme un tejido cicatricial en las trompas de Falopio y que las obstruya para que el espermatozoide no pueda llegar al vulo. El procedimiento demora alrededor de 3 meses para que sea efectivo. Debe utilizarse otro mtodo anticonceptivo durante esos 3 meses. Esterilizacin masculina Este es un procedimiento que consiste en atar los conductos que transportan el esperma (vasectoma). Luego del procedimiento, el hombre puede eyacular lquido (semen). Debe utilizarse otro mtodo anticonceptivo durante 3 meses despus del procedimiento. Mtodos de planificacin natural Planificacin familiar natural En este mtodo, la pareja no tiene sexo durante los das en que la mujer podra quedar  embarazada. Mtodo calendario En este mtodo, la mujer realiza un seguimiento de la duracin de cada ciclo menstrual, identifica los das en los que se puede producir un embarazo y no tiene sexo durante esos das. Mtodo de la ovulacin En este mtodo, la pareja evita tener sexo durante la ovulacin. Mtodo sintotrmico Este mtodo implica no tener sexo durante la ovulacin. Normalmente, la mujer comprueba la ovulacin al observar cambios en su temperatura y en la consistencia del moco cervical. Mtodo posovulacin En este mtodo, la pareja espera a que finalice la ovulacin para tener sexo. Dnde buscar ms informacin  Centers for Disease Control and Prevention (Centros para el Control y la Prevencin de Enfermedades): www.cdc.gov Resumen  La anticoncepcin, o los mtodos anticonceptivos, hace referencia a los mtodos o dispositivos que evitan el embarazo.  Los mtodos anticonceptivos hormonales incluyen implantes, inyecciones, pastillas, parches, anillos vaginales y anticonceptivos de emergencia.  Los mtodos anticonceptivos de barrera pueden incluir condones masculinos, condones femeninos, diafragmas, capuchones cervicales, esponjas y espermicidas.  Existen dos tipos de DIU (dispositivo intrauterino). Un DIU puede colocarse en el tero de una mujer para evitar el embarazo durante 3 a 5 aos.  La esterilizacin permanente puede realizarse mediante un procedimiento tanto en los hombres como en las mujeres. Los mtodos de planificacin familiar natural implican no tener sexo durante los das en que la mujer podra quedar embarazada. Esta informacin no tiene como fin reemplazar el consejo del mdico. Asegrese de hacerle al mdico cualquier pregunta que   tenga. Document Revised: 03/29/2020 Document Reviewed: 03/29/2020 Elsevier Patient Education  2021 [employer].   Lactancia materna Breastfeeding  Decidir amamantar es una de las mejores elecciones que puede hacer por usted y su  beb. Un cambio en las hormonas durante el embarazo hace que las mamas produzcan leche materna en las glndulas productoras de leche. Las hormonas impiden que la leche materna sea liberada antes del nacimiento del beb. Adems, impulsan el flujo de leche luego del nacimiento. Una vez que ha comenzado a amamantar, pensar en el beb, as como la succin o el llanto, pueden estimular la liberacin de leche de las glndulas productoras de leche. Los beneficios de amamantar Las investigaciones demuestran que la lactancia materna ofrece muchos beneficios de salud para bebs y madres. Adems, ofrece una forma gratuita y conveniente de alimentar al beb. Para el beb  La primera leche (calostro) ayuda a mejorar el funcionamiento del aparato digestivo del beb.  Las clulas especiales de la leche (anticuerpos) ayudan a combatir las infecciones en el beb.  Los bebs que se alimentan con leche materna tambin tienen menos probabilidades de tener asma, alergias, obesidad o diabetes de tipo 2. Adems, tienen menor riesgo de sufrir el sndrome de muerte sbita del lactante (SMSL).  Los nutrientes de la leche materna son mejores para satisfacer las necesidades del beb en comparacin con la leche maternizada.  La leche materna mejora el desarrollo cerebral del beb. Para usted  La lactancia materna favorece el desarrollo de un vnculo muy especial entre la madre y el beb.  Es conveniente. La leche materna es econmica y siempre est disponible a la temperatura correcta.  La lactancia materna ayuda a quemar caloras. Le ayuda a perder el peso ganado durante el embarazo.  Hace que el tero vuelva al tamao que tena antes del embarazo ms rpido. Adems, disminuye el sangrado (loquios) despus del parto.  La lactancia materna contribuye a reducir el riesgo de tener diabetes de tipo 2, osteoporosis, artritis reumatoide, enfermedades cardiovasculares y cncer de mama, ovario, tero y endometrio en el  futuro. Informacin bsica sobre la lactancia Comienzo de la lactancia  Encuentre un lugar cmodo para sentarse o acostarse, con un buen respaldo para el cuello y la espalda.  Coloque una almohada o una manta enrollada debajo del beb para acomodarlo a la altura de la mama (si est sentada). Las almohadas para amamantar se han diseado especialmente a fin de servir de apoyo para los brazos y el beb mientras amamanta.  Asegrese de que la barriga del beb (abdomen) est frente a la suya.  Masajee suavemente la mama. Con las yemas de los dedos, masajee los bordes exteriores de la mama hacia adentro, en direccin al pezn. Esto estimula el flujo de leche. Si la leche fluye lentamente, es posible que deba continuar con este movimiento durante la lactancia.  Sostenga la mama con 4 dedos por debajo y el pulgar por arriba del pezn (forme la letra "C" con la mano). Asegrese de que los dedos se encuentren lejos del pezn y de la boca del beb.  Empuje suavemente los labios del beb con el pezn o con el dedo.  Cuando la boca del beb se abra lo suficiente, acrquelo rpidamente a la mama e introduzca todo el pezn y la arola, tanto como sea posible, dentro de la boca del beb. La arola es la zona de color que rodea al pezn. ? Debe haber ms arola visible por arriba del labio superior del beb que por debajo del labio   inferior. ? Los labios del beb deben estar abiertos y extendidos hacia afuera (evertidos) para asegurar que el beb se prenda de forma adecuada y cmoda. ? La lengua del beb debe estar entre la enca inferior y la mama.  Asegrese de que la boca del beb est en la posicin correcta alrededor del pezn (prendido). Los labios del beb deben crear un sello sobre la mama y estar doblados hacia afuera (invertidos).  Es comn que el beb succione durante 2 a 3 minutos para que comience el flujo de leche materna. Cmo debe prenderse Es muy importante que le ensee al beb cmo  prenderse adecuadamente a la mama. Si el beb no se prende adecuadamente, puede causar dolor en los pezones, reducir la produccin de leche materna y hacer que el beb tenga un escaso aumento de peso. Adems, si el beb no se prende adecuadamente al pezn, puede tragar aire durante la alimentacin. Esto puede causarle molestias al beb. Hacer eructar al beb al cambiar de mama puede ayudarlo a liberar el aire. Sin embargo, ensearle al beb cmo prenderse a la mama adecuadamente es la mejor manera de evitar que se sienta molesto por tragar aire mientras se alimenta. Signos de que el beb se ha prendido adecuadamente al pezn  Tironea o succiona de modo silencioso, sin causarle dolor. Los labios del beb deben estar extendidos hacia afuera (evertidos).  Se escucha que traga cada 3 o 4 succiones una vez que la leche ha comenzado a fluir (despus de que se produzca el reflejo de eyeccin de la leche).  Hay movimientos musculares por arriba y por delante de sus odos al succionar. Signos de que el beb no se ha prendido adecuadamente al pezn  Hace ruidos de succin o de chasquido mientras se alimenta.  Siente dolor en los pezones. Si cree que el beb no se prendi correctamente, deslice el dedo en la comisura de la boca y colquelo entre las encas del beb para interrumpir la succin. Intente volver a comenzar a amamantar. Signos de lactancia materna exitosa Signos del beb  El beb disminuir gradualmente el nmero de succiones o dejar de succionar por completo.  El beb se quedar dormido.  El cuerpo del beb se relajar.  El beb retendr una pequea cantidad de leche en la boca.  El beb se desprender solo del pecho. Signos que presenta usted  Las mamas han aumentado la firmeza, el peso y el tamao 1 a 3 horas despus de amamantar.  Estn ms blandas inmediatamente despus de amamantar.  Se producen un aumento del volumen de leche y un cambio en su consistencia y color hacia el  quinto da de lactancia.  Los pezones no duelen, no estn agrietados ni sangran. Signos de que su beb recibe la cantidad de leche suficiente  Mojar por lo menos 1 o 2paales durante las primeras 24horas despus del nacimiento.  Mojar por lo menos 5 o 6paales cada 24horas durante la primera semana despus del nacimiento. La orina debe ser clara o de color amarillo plido a los 5das de vida.  Mojar entre 6 y 8paales cada 24horas a medida que el beb sigue creciendo y desarrollndose.  Defeca por lo menos 3 veces en 24 horas a los 5 das de vida. Las heces deben ser blandas y amarillentas.  Defeca por lo menos 3 veces en 24 horas a los 7 das de vida. Las heces deben ser grumosas y amarillentas.  No registra una prdida de peso mayor al 10% del peso al   nacer durante los primeros 3 das de vida.  Aumenta de peso un promedio de 4 a 7onzas (113 a 198g) por semana despus de los 4 das de vida.  Aumenta de peso, diariamente, de manera uniforme a partir de los 5 das de vida, sin registrar prdida de peso despus de las 2semanas de vida. Despus de alimentarse, es posible que el beb regurgite una pequea cantidad de leche. Esto es normal. Frecuencia y duracin de la lactancia El amamantamiento frecuente la ayudar a producir ms leche y puede prevenir dolores en los pezones y las mamas extremadamente llenas (congestin mamaria). Alimente al beb cuando muestre signos de hambre o si siente la necesidad de reducir la congestin de las mamas. Esto se denomina "lactancia a demanda". Las seales de que el beb tiene hambre incluyen las siguientes:  Aumento del estado de alerta, actividad o inquietud.  Mueve la cabeza de un lado a otro.  Abre la boca cuando se le toca la mejilla o la comisura de la boca (reflejo de bsqueda).  Aumenta las vocalizaciones, tales como sonidos de succin, se relame los labios, emite arrullos, suspiros o chirridos.  Mueve la mano hacia la boca y se chupa  los dedos o las manos.  Est molesto o llora. Evite el uso del chupete en las primeras 4 a 6 semanas despus del nacimiento del beb. Despus de este perodo, podr usar un chupete. Las investigaciones demostraron que el uso del chupete durante el primer ao de vida del beb disminuye el riesgo de tener el sndrome de muerte sbita del lactante (SMSL). Permita que el nio se alimente en cada mama todo lo que desee. Cuando el beb se desprende o se queda dormido mientras se est alimentando de la primera mama, ofrzcale la segunda. Debido a que, con frecuencia, los recin nacidos estn somnolientos las primeras semanas de vida, es posible que deba despertar al beb para alimentarlo. Los horarios de lactancia varan de un beb a otro. Sin embargo, las siguientes reglas pueden servir como gua para ayudarla a garantizar que el beb se alimenta adecuadamente:  Se puede amamantar a los recin nacidos (bebs de 4 semanas o menos de vida) cada 1 a 3 horas.  No deben transcurrir ms de 3 horas durante el da o 5 horas durante la noche sin que se amamante a los recin nacidos.  Debe amamantar al beb un mnimo de 8 veces en un perodo de 24 horas. Extraccin de leche materna La extraccin y el almacenamiento de la leche materna le permiten asegurarse de que el beb se alimente exclusivamente de su leche materna, aun en momentos en los que no puede amamantar. Esto tiene especial importancia si debe regresar al trabajo en el perodo en que an est amamantando o si no puede estar presente en los momentos en que el beb debe alimentarse. Su asesor en lactancia puede ayudarla a encontrar un mtodo de extraccin que funcione mejor para usted y orientarla sobre cunto tiempo es seguro almacenar leche materna.      Cmo cuidar las mamas durante la lactancia Los pezones pueden secarse, agrietarse y doler durante la lactancia. Las siguientes recomendaciones pueden ayudarla a mantener las mamas humectadas y  sanas:  Evite usar jabn en los pezones.  Use un sostn de soporte diseado especialmente para la lactancia materna. Evite usar sostenes con aro o sostenes muy ajustados (sostenes deportivos).  Seque al aire sus pezones durante 3 a 4minutos despus de amamantar al beb.  Utilice solo apsitos de algodn en   el sostn para absorber las prdidas de leche. La prdida de un poco de leche materna entre las tomas es normal.  Utilice lanolina sobre los pezones luego de amamantar. La lanolina ayuda a mantener la humedad normal de la piel. La lanolina pura no es perjudicial (no es txica) para el beb. Adems, puede extraer manualmente algunas gotas de leche materna y masajear suavemente esa leche sobre los pezones para que la leche se seque al aire. Durante las primeras semanas despus del nacimiento, algunas mujeres experimentan congestin mamaria. La congestin mamaria puede hacer que sienta las mamas pesadas, calientes y sensibles al tacto. El pico de la congestin mamaria ocurre en el plazo de los 3 a 5 das despus del parto. Las siguientes recomendaciones pueden ayudarla a aliviar la congestin mamaria:  Vace por completo las mamas al amamantar o extraer leche. Puede aplicar calor hmedo en las mamas (en la ducha o con toallas hmedas para manos) antes de amamantar o extraer leche. Esto aumenta la circulacin y ayuda a que la leche fluya. Si el beb no vaca por completo las mamas cuando lo amamanta, extraiga la leche restante despus de que haya finalizado.  Aplique compresas de hielo sobre las mamas inmediatamente despus de amamantar o extraer leche, a menos que le resulte demasiado incmodo. Haga lo siguiente: ? Ponga el hielo en una bolsa plstica. ? Coloque una toalla entre la piel y la bolsa de hielo. ? Coloque el hielo durante 20minutos, 2 o 3veces por da.  Asegrese de que el beb est prendido y se encuentre en la posicin correcta mientras lo alimenta. Si la congestin mamaria  persiste luego de 48 horas o despus de seguir estas recomendaciones, comunquese con su mdico o un asesor en lactancia. Recomendaciones de salud general durante la lactancia  Consuma 3 comidas y 3 colaciones saludables todos los das. Las madres bien alimentadas que amamantan necesitan entre 450 y 500 caloras adicionales por da. Puede cumplir con este requisito al aumentar la cantidad de una dieta equilibrada que realice.  Beba suficiente agua para mantener la orina clara o de color amarillo plido.  Descanse con frecuencia, reljese y siga tomando sus vitaminas prenatales para prevenir la fatiga, el estrs y los niveles bajos de vitaminas y minerales en el cuerpo (deficiencias de nutrientes).  No consuma ningn producto que contenga nicotina o tabaco, como cigarrillos y cigarrillos electrnicos. El beb puede verse afectado por las sustancias qumicas de los cigarrillos que pasan a la leche materna y por la exposicin al humo ambiental del tabaco. Si necesita ayuda para dejar de fumar, consulte al mdico.  Evite el consumo de alcohol.  No consuma drogas ilegales o marihuana.  Antes de usar cualquier medicamento, hable con el mdico. Estos incluyen medicamentos recetados y de venta libre, como tambin vitaminas y suplementos a base de hierbas. Algunos medicamentos, que pueden ser perjudiciales para el beb, pueden pasar a travs de la leche materna.  Puede quedar embarazada durante la lactancia. Si se desea un mtodo anticonceptivo, consulte al mdico sobre cules son las opciones seguras durante la lactancia. Dnde encontrar ms informacin: Liga internacional La [place]eche: www.llli.org. Comunquese con un mdico si:  Siente que quiere dejar de amamantar o se siente frustrada con la lactancia.  Sus pezones estn agrietados o sangran.  Sus mamas estn irritadas, sensibles o calientes.  Tiene los siguientes sntomas: ? Dolor en las mamas o en los pezones. ? Un rea hinchada en cualquiera  de las mamas. ? Fiebre o escalofros. ? Nuseas o vmitos. ?   Drenaje de otro lquido distinto de la leche materna desde los pezones.  Sus mamas no se llenan antes de amamantar al beb para el quinto da despus del parto.  Se siente triste y deprimida.  El beb: ? Est demasiado somnoliento como para comer bien. ? Tiene problemas para dormir. ? Tiene ms de 1 semana de vida y moja menos de 6 paales en un periodo de 24 horas. ? No ha aumentado de peso a los 5 das de vida.  El beb defeca menos de 3 veces en 24 horas.  La piel del beb o las partes blancas de los ojos se vuelven amarillentas. Solicite ayuda de inmediato si:  El beb est muy cansado (letargo) y no se quiere despertar para comer.  Le sube la fiebre sin causa. Resumen  La lactancia materna ofrece muchos beneficios de salud para bebs y madres.  Intente amamantar a su beb cuando muestre signos tempranos de hambre.  Haga cosquillas o empuje suavemente los labios del beb con el dedo o el pezn para lograr que el beb abra la boca. Acerque el beb a la mama. Asegrese de que la mayor parte de la arola se encuentre dentro de la boca del beb. Ofrzcale una mama y haga eructar al beb antes de pasar a la otra.  Hable con su mdico o asesor en lactancia si tiene dudas o problemas con la lactancia. Esta informacin no tiene como fin reemplazar el consejo del mdico. Asegrese de hacerle al mdico cualquier pregunta que tenga. Document Revised: 11/21/2017 Document Reviewed: 12/17/2016 Elsevier Patient Education  2021 [employer] Inc.  

## 2020-10-26 NOTE — Progress Notes (Signed)
   Subjective:  [name] is a 40 y.o. [id number] at [redacted]w[redacted]d being seen today for ongoing prenatal care.  She is currently monitored for the following issues for this high-risk pregnancy and has HNP (herniated nucleus pulposus), lumbar; PTSD (post-traumatic stress disorder); Depression with suicidal ideation; Suicide attempt by multiple drug overdose (HCC); Chronic pain following surgery or procedure; ASCUS of cervix with negative high risk HPV; Language barrier; AMA (advanced maternal age) multigravida 35+; Cholelithiasis; Supervision of high risk pregnancy, antepartum; Maternal obesity affecting pregnancy, antepartum; Gestational diabetes mellitus (GDM) affecting pregnancy; and Cholestasis of pregnancy on their problem list.  Patient reports no complaints.  Contractions: Not present. Vag. Bleeding: None.  Movement: Present. Denies leaking of fluid.   The following portions of the patient's history were reviewed and updated as appropriate: allergies, current medications, past family history, past medical history, past social history, past surgical history and problem list. Problem list updated.  Objective:   Vitals:   10/26/20 0852  BP: 103/71  Pulse: 88  Weight: 229 lb 3.2 oz (104 kg)    Fetal Status: Fetal Heart Rate (bpm): 131   Movement: Present     General:  Alert, oriented and cooperative. Patient is in no acute distress.  Skin: Skin is warm and dry. No rash noted.   Cardiovascular: Normal heart rate noted  Respiratory: Normal respiratory effort, no problems with respiration noted  Abdomen: Soft, gravid, appropriate for gestational age. Pain/Pressure: Present     Pelvic: Vag. Bleeding: None     Cervical exam performed Dilation: Closed Effacement (%): Thick    Extremities: Normal range of motion.  Edema: Trace  Mental Status: Normal mood and affect. Normal behavior. Normal judgment and thought content.   Urinalysis:      Assessment and Plan:  Pregnancy: [id number] at [redacted]w[redacted]d  1.  Supervision of high risk pregnancy, antepartum BP and FHR normal Swabs today given delivery planned for 37 weeks IOL scheduled, orders placed   2. Language barrier Spanish  3. ASCUS of cervix with negative high risk HPV NILM 07/2020  4. Multigravida of advanced maternal age in third trimester   5. Gestational diabetes mellitus (GDM) affecting pregnancy Does not have her sugar log with her Reports fasting highest is 97 Post prandials are usually in the 120's Will start metformin 500mg  BID Following w MFM Scheduled for IOL at 37 weeks  6. Cholestasis during pregnancy in third trimester Newly diagnosed, following w MFM for weekly antenatal testing BPP 8/8 on 10/24/20 Reports intermittent mild itching on face and sometimes feet, but not constant Scheduled for IOL at 37 weeks  7. Calculus of gallbladder without cholecystitis without obstruction Asymptomatic  Preterm labor symptoms and general obstetric precautions including but not limited to vaginal bleeding, contractions, leaking of fluid and fetal movement were reviewed in detail with the patient. Please refer to After Visit Summary for other counseling recommendations.  Return in 1 week (on 11/02/2020) for [facility], ob visit, needs MD.   [facility], MD

## 2020-10-26 NOTE — Progress Notes (Signed)
[facility] Interpreter: Maricela # [phone]

## 2020-10-26 NOTE — Addendum Note (Signed)
Addended by: Maxwell Marion E on: 10/26/2020 11:16 AM   Modules accepted: Orders

## 2020-10-27 LAB — GC/CHLAMYDIA PROBE AMP (~~LOC~~) NOT AT ARMC
Chlamydia: NEGATIVE
Comment: NEGATIVE
Comment: NORMAL
Neisseria Gonorrhea: NEGATIVE

## 2020-10-28 ENCOUNTER — Telehealth (HOSPITAL_COMMUNITY): Payer: Self-pay | Admitting: *Deleted

## 2020-10-28 NOTE — Telephone Encounter (Signed)
Interpreter number [phone] Preadmission screen

## 2020-10-30 ENCOUNTER — Other Ambulatory Visit: Payer: Self-pay | Admitting: Advanced Practice Midwife

## 2020-10-30 LAB — CULTURE, BETA STREP (GROUP B ONLY): Strep Gp B Culture: NEGATIVE

## 2020-10-31 ENCOUNTER — Other Ambulatory Visit: Payer: Self-pay

## 2020-10-31 ENCOUNTER — Ambulatory Visit: Payer: Self-pay | Admitting: *Deleted

## 2020-10-31 ENCOUNTER — Ambulatory Visit: Payer: Self-pay | Attending: Obstetrics

## 2020-10-31 ENCOUNTER — Encounter: Payer: Self-pay | Admitting: *Deleted

## 2020-10-31 DIAGNOSIS — O0990 Supervision of high risk pregnancy, unspecified, unspecified trimester: Secondary | ICD-10-CM | POA: Insufficient documentation

## 2020-10-31 DIAGNOSIS — O24410 Gestational diabetes mellitus in pregnancy, diet controlled: Secondary | ICD-10-CM | POA: Insufficient documentation

## 2020-10-31 DIAGNOSIS — O24419 Gestational diabetes mellitus in pregnancy, unspecified control: Secondary | ICD-10-CM | POA: Insufficient documentation

## 2020-10-31 DIAGNOSIS — O99210 Obesity complicating pregnancy, unspecified trimester: Secondary | ICD-10-CM

## 2020-11-01 ENCOUNTER — Telehealth (HOSPITAL_COMMUNITY): Payer: Self-pay | Admitting: *Deleted

## 2020-11-01 NOTE — Telephone Encounter (Signed)
Preadmission screen Interpreter number [phone]

## 2020-11-02 ENCOUNTER — Other Ambulatory Visit: Payer: Self-pay

## 2020-11-02 ENCOUNTER — Encounter (HOSPITAL_COMMUNITY): Payer: Self-pay | Admitting: Obstetrics and Gynecology

## 2020-11-02 ENCOUNTER — Ambulatory Visit (INDEPENDENT_AMBULATORY_CARE_PROVIDER_SITE_OTHER): Payer: Self-pay | Admitting: Family Medicine

## 2020-11-02 ENCOUNTER — Inpatient Hospital Stay (HOSPITAL_COMMUNITY)
Admission: AD | Admit: 2020-11-02 | Discharge: 2020-11-04 | Disposition: A | Discharge status: Home / Self Care | Payer: Self-pay | Attending: Obstetrics and Gynecology | Admitting: Obstetrics and Gynecology

## 2020-11-02 VITALS — BP 110/78 | HR 102 | Wt 229.2 lb

## 2020-11-02 DIAGNOSIS — O99613 Diseases of the digestive system complicating pregnancy, third trimester: Secondary | ICD-10-CM | POA: Insufficient documentation

## 2020-11-02 DIAGNOSIS — K8010 Calculus of gallbladder with chronic cholecystitis without obstruction: Secondary | ICD-10-CM | POA: Insufficient documentation

## 2020-11-02 DIAGNOSIS — K831 Obstruction of bile duct: Secondary | ICD-10-CM | POA: Diagnosis present

## 2020-11-02 DIAGNOSIS — O26613 Liver and biliary tract disorders in pregnancy, third trimester: Secondary | ICD-10-CM

## 2020-11-02 DIAGNOSIS — Z885 Allergy status to narcotic agent status: Secondary | ICD-10-CM | POA: Insufficient documentation

## 2020-11-02 DIAGNOSIS — O24419 Gestational diabetes mellitus in pregnancy, unspecified control: Secondary | ICD-10-CM

## 2020-11-02 DIAGNOSIS — O26649 Intrahepatic cholestasis of pregnancy, unspecified trimester: Secondary | ICD-10-CM | POA: Diagnosis present

## 2020-11-02 DIAGNOSIS — R1011 Right upper quadrant pain: Secondary | ICD-10-CM

## 2020-11-02 DIAGNOSIS — O99210 Obesity complicating pregnancy, unspecified trimester: Secondary | ICD-10-CM

## 2020-11-02 DIAGNOSIS — Z789 Other specified health status: Secondary | ICD-10-CM

## 2020-11-02 DIAGNOSIS — K8063 Calculus of gallbladder and bile duct with acute cholecystitis with obstruction: Secondary | ICD-10-CM

## 2020-11-02 DIAGNOSIS — Z7982 Long term (current) use of aspirin: Secondary | ICD-10-CM | POA: Insufficient documentation

## 2020-11-02 DIAGNOSIS — O0990 Supervision of high risk pregnancy, unspecified, unspecified trimester: Secondary | ICD-10-CM

## 2020-11-02 DIAGNOSIS — K8020 Calculus of gallbladder without cholecystitis without obstruction: Secondary | ICD-10-CM | POA: Diagnosis present

## 2020-11-02 DIAGNOSIS — Z758 Other problems related to medical facilities and other health care: Secondary | ICD-10-CM | POA: Diagnosis present

## 2020-11-02 DIAGNOSIS — O24415 Gestational diabetes mellitus in pregnancy, controlled by oral hypoglycemic drugs: Secondary | ICD-10-CM | POA: Insufficient documentation

## 2020-11-02 DIAGNOSIS — R45851 Suicidal ideations: Secondary | ICD-10-CM

## 2020-11-02 DIAGNOSIS — Z20822 Contact with and (suspected) exposure to covid-19: Secondary | ICD-10-CM | POA: Insufficient documentation

## 2020-11-02 DIAGNOSIS — F32A Depression, unspecified: Secondary | ICD-10-CM

## 2020-11-02 DIAGNOSIS — O09523 Supervision of elderly multigravida, third trimester: Secondary | ICD-10-CM | POA: Insufficient documentation

## 2020-11-02 DIAGNOSIS — Z3A36 36 weeks gestation of pregnancy: Secondary | ICD-10-CM | POA: Insufficient documentation

## 2020-11-02 DIAGNOSIS — K810 Acute cholecystitis: Secondary | ICD-10-CM | POA: Diagnosis present

## 2020-11-02 DIAGNOSIS — O26643 Intrahepatic cholestasis of pregnancy, third trimester: Secondary | ICD-10-CM

## 2020-11-02 MED ORDER — LACTATED RINGERS IV SOLN: Freq: Once | INTRAVENOUS | Status: AC

## 2020-11-02 MED ORDER — HYOSCYAMINE SULFATE 0.125 MG SL SUBL
0.1250 mg | SUBLINGUAL_TABLET | Freq: Once | SUBLINGUAL | Status: AC
  Administered 2020-11-03: 0.125 mg via SUBLINGUAL
  Filled 2020-11-02: qty 1

## 2020-11-02 MED ORDER — SIMETHICONE 80 MG PO CHEW
160.0000 mg | CHEWABLE_TABLET | Freq: Once | ORAL | Status: AC
  Administered 2020-11-03: 160 mg via ORAL
  Filled 2020-11-02: qty 2

## 2020-11-02 NOTE — Progress Notes (Signed)
   Subjective:  [name] is a 40 y.o. [id number] at [redacted]w[redacted]d being seen today for ongoing prenatal care.  She is currently monitored for the following issues for this high-risk pregnancy and has HNP (herniated nucleus pulposus), lumbar; PTSD (post-traumatic stress disorder); Depression with suicidal ideation; Suicide attempt by multiple drug overdose (HCC); Chronic pain following surgery or procedure; ASCUS of cervix with negative high risk HPV; Language barrier; AMA (advanced maternal age) multigravida 35+; Cholelithiasis; Supervision of high risk pregnancy, antepartum; Maternal obesity affecting pregnancy, antepartum; Gestational diabetes mellitus (GDM) affecting pregnancy; and Cholestasis of pregnancy on their problem list.  Patient reports depression.  Contractions: Irritability. Vag. Bleeding: None.  Movement: Present. Denies leaking of fluid.   The following portions of the patient's history were reviewed and updated as appropriate: allergies, current medications, past family history, past medical history, past social history, past surgical history and problem list. Problem list updated.  Objective:   Vitals:   11/02/20 1455  BP: 110/78  Pulse: (!) 102  Weight: 229 lb 3.2 oz (104 kg)    Fetal Status: Fetal Heart Rate (bpm): 145   Movement: Present     General:  Alert, oriented and cooperative. Patient is in no acute distress.  Skin: Skin is warm and dry. No rash noted.   Cardiovascular: Normal heart rate noted  Respiratory: Normal respiratory effort, no problems with respiration noted  Abdomen: Soft, gravid, appropriate for gestational age. Pain/Pressure: Present     Pelvic: Vag. Bleeding: None     Cervical exam deferred        Extremities: Normal range of motion.  Edema: None  Mental Status: Normal mood and affect. Normal behavior. Normal judgment and thought content.   Urinalysis:      Assessment and Plan:  Pregnancy: [id number] at [redacted]w[redacted]d  1. Supervision of high risk pregnancy,  antepartum BP and FHR normal Aware of IOL on Monday at midnight  2. Depression with suicidal ideation Reports increased depressive symptoms over past several days Some passive SI thinking she'd be better of dead, no plan Contracts for safety and knows to go to MAU if has active SI Identifies family as support  3. Language barrier Spanish  4. Maternal obesity affecting pregnancy, antepartum   5. Multigravida of advanced maternal age in third trimester   6. Gestational diabetes mellitus (GDM) affecting pregnancy Lost log but reports well controlled [place] range 80's-low 90's PP are all less than 120 that she recalls Last growth 2/7 w LGA, EFW 97% and 2964g Pelvis proven to 3900g  7. Cholestasis during pregnancy in third trimester Following w MFM, most recent BPP two days prior 8/8 Denies any itching today  Preterm labor symptoms and general obstetric precautions including but not limited to vaginal bleeding, contractions, leaking of fluid and fetal movement were reviewed in detail with the patient. Please refer to After Visit Summary for other counseling recommendations.  Return in about 6 weeks (around 12/14/2020) for PP check.   Venora Maples, MD

## 2020-11-02 NOTE — MAU Provider Note (Incomplete)
Chief Complaint:  Abdominal Pain and Back Pain   Event Date/Time   First Provider Initiated Contact with Patient 11/02/20 2327     Interpretor used, but patient understands fair amount of English  HPI: [name] is a 40 y.o. [id number] at 31w2dwho presents to maternity admissions reporting pain in RUQ radiating to her back. No vomiting. Similar to prior episodes. Known cholelithiasis and cholestasis. . She reports good fetal movement, denies LOF, vaginal bleeding, vaginal itching/burning, urinary symptoms, h/a, dizziness, n/v, diarrhea, constipation or fever/chills.   She is scheduled for Induction of Labor on Monday for cholestasis.  .Also has Gestational DM.  Abdominal Pain This is a recurrent problem. The current episode started today. The problem occurs constantly. The pain is located in the RUQ. The quality of the pain is cramping, colicky and sharp. The abdominal pain radiates to the back. Pertinent negatives include no anorexia, constipation, diarrhea, dysuria, fever, nausea or vomiting. The pain is aggravated by palpation. The pain is relieved by nothing. She has tried nothing for the symptoms.   RN Note: Having pain in upper abdomen that radiates to back. Feels like gallbladder pain. Last ate about 2000. Denies VB or LOF. NO pregnancy concerns  Past Medical History: Past Medical History:  Diagnosis Date  . Anxiety    relative to circumstances to back problems/injury, not taking any medicine, pt. reports has been crying a lot  . Arthritis    low back  . Asthma   . Back pain   . Cholelithiasis 04/08/2020  . Depression   . GERD (gastroesophageal reflux disease)   . Migraines   . Neck pain     Past obstetric history: OB History  Gravida Para Term Preterm AB Living  9 5 5  0 3 5  SAB IAB Ectopic Multiple Live Births  0 2 1 0 5    # Outcome Date GA Lbr Len/2nd Weight Sex Delivery Anes PTL Lv  9 Current           8 Term 12/27/18 [redacted]w[redacted]d 08:45 / 00:19 3980 g M Vag-Spont EPI   LIV  7 Ectopic 11/2017 [redacted]w[redacted]d         6 IAB 2016 [redacted]w[redacted]d         5 IAB 2011 [redacted]w[redacted]d         4 Term 01/08/09 [redacted]w[redacted]d  3175 g F Vag-Spont EPI N LIV  3 Term 04/23/07 [redacted]w[redacted]d  3175 g M Vag-Spont EPI  LIV  2 Term 04/10/02 [redacted]w[redacted]d  3175 g F Vag-Spont EPI  LIV  1 Term 03/30/98 [redacted]w[redacted]d  3629 g M Vag-Spont EPI N LIV    Past Surgical History: Past Surgical History:  Procedure Laterality Date  . childbirth     x4 vaginal   . LUMBAR LAMINECTOMY/DECOMPRESSION MICRODISCECTOMY  06/05/2012   Procedure: LUMBAR LAMINECTOMY/DECOMPRESSION MICRODISCECTOMY;  Surgeon: 06/07/2012, MD;  Location: [facility] OR;  Service: Orthopedics;  Laterality: Left;  Left sided lumbar 4-5 microdisectomy    Family History: Family History  Problem Relation Age of Onset  . Asthma Brother     Social History: Social History   Tobacco Use  . Smoking status: Never Smoker  . Smokeless tobacco: Never Used  Vaping Use  . Vaping Use: Never used  Substance Use Topics  . Alcohol use: No  . Drug use: No    Allergies:  Allergies  Allergen Reactions  . Hydrocodone-Acetaminophen Itching    Has some itching on face & nose but not bad enough to stop taking it/  MD aware and instructed patient to take anti-allergy medication prn.    Meds:  Medications Prior to Admission  Medication Sig Dispense Refill Last Dose  . aspirin EC 81 MG tablet Take 1 tablet (81 mg total) by mouth daily. Take after 12 weeks for prevention of preeclampsia later in pregnancy 300 tablet 2 11/02/2020 at Unknown time  . Hyoscyamine Sulfate SL (LEVSIN/SL) 0.125 MG SUBL Place 0.125 mg under the tongue every 12 (twelve) hours as needed (abdominal gas pain). 30 tablet 0 11/02/2020 at Unknown time  . pantoprazole (PROTONIX) 20 MG tablet Take 1 tablet (20 mg total) by mouth daily. 30 tablet 0 11/02/2020 at Unknown time  . simethicone (GAS-X) 80 MG chewable tablet Chew 1 tablet (80 mg total) by mouth every 6 (six) hours as needed for flatulence. 30 tablet 0 11/02/2020 at  Unknown time  . acetaminophen (TYLENOL) 500 MG tablet Take 500 mg by mouth every 6 (six) hours as needed.     Marland Kitchen albuterol (ACCUNEB) 0.63 MG/3ML nebulizer solution Take 1 ampule by nebulization every 6 (six) hours as needed for wheezing.     . ferrous sulfate 325 (65 FE) MG tablet Take 325 mg by mouth daily with breakfast.     . loratadine (CLARITIN) 10 MG tablet Take 10 mg by mouth daily. (Patient not taking: No sig reported)     . metFORMIN (GLUCOPHAGE) 500 MG tablet Take 1 tablet (500 mg total) by mouth 2 (two) times daily with a meal. 60 tablet 1   . ondansetron (ZOFRAN ODT) 4 MG disintegrating tablet Take 1 tablet (4 mg total) by mouth every 6 (six) hours as needed. (Patient not taking: No sig reported) 20 tablet 0   . Prenatal Vit-Fe Fumarate-FA (PRENATAL MULTIVITAMIN) TABS tablet Take 1 tablet by mouth daily at 12 noon.       I have reviewed patient's Past Medical Hx, Surgical Hx, Family Hx, Social Hx, medications and allergies.   ROS:  Review of Systems  Constitutional: Negative for chills and fever.  Respiratory: Negative for shortness of breath.   Gastrointestinal: Positive for abdominal pain. Negative for anorexia, constipation, diarrhea, nausea and vomiting.  Genitourinary: Negative for dysuria, pelvic pain and vaginal bleeding.  Musculoskeletal: Positive for back pain (referred from gallbladder).   Other systems negative  Physical Exam   Patient Vitals for the past 24 hrs:  Temp Resp SpO2 Height Weight  11/02/20 2308 - - 99 % - -  11/02/20 2301 98.2 F (36.8 C) 20 - 5\' 2"  (1.575 m) 106.1 kg   Constitutional: Well-developed, well-nourished female in no acute distress, but quite uncomfortable.  Cardiovascular: normal rate and rhythm Respiratory: normal effort, clear to auscultation bilaterally  GI: Abd soft, tender over RUQ,  No rebound but has some guarding.   Some bloating  Uterus  gravid appropriate for gestational age.   MS: Extremities nontender, no edema, normal  ROM Neurologic: Alert and oriented x 4.  GU: Neg CVAT.  PELVIC EXAM: deferred   FHT:  Baseline 140 , moderate variability, accelerations present, no decelerations Contractions: Uterine irritability   Labs: Results for orders placed or performed during the hospital encounter of 11/02/20 (from the past 24 hour(s))  CBC     Status: Abnormal   Collection Time: 11/02/20 11:31 PM  Result Value Ref Range   WBC 9.7 4.0 - 10.5 K/uL   RBC 3.33 (L) 3.87 - 5.11 MIL/uL   Hemoglobin 9.5 (L) 12.0 - 15.0 g/dL   HCT 11/04/20 (L) 95.6 - 21.3 %  MCV 86.2 80.0 - 100.0 fL   MCH 28.5 26.0 - 34.0 pg   MCHC 33.1 30.0 - 36.0 g/dL   RDW 29.5 62.1 - 30.8 %   Platelets 302 150 - 400 K/uL   nRBC 0.0 0.0 - 0.2 %  Comprehensive metabolic panel     Status: Abnormal   Collection Time: 11/02/20 11:31 PM  Result Value Ref Range   Sodium 134 (L) 135 - 145 mmol/L   Potassium 3.8 3.5 - 5.1 mmol/L   Chloride 103 98 - 111 mmol/L   CO2 20 (L) 22 - 32 mmol/L   Glucose, Bld 138 (H) 70 - 99 mg/dL   BUN 9 6 - 20 mg/dL   Creatinine, Ser 6.57 0.44 - 1.00 mg/dL   Calcium 8.8 (L) 8.9 - 10.3 mg/dL   Total Protein 6.1 (L) 6.5 - 8.1 g/dL   Albumin 2.4 (L) 3.5 - 5.0 g/dL   AST 29 15 - 41 U/L   ALT 21 0 - 44 U/L   Alkaline Phosphatase 265 (H) 38 - 126 U/L   Total Bilirubin 0.6 0.3 - 1.2 mg/dL   GFR, Estimated >84 >69 mL/min   Anion gap 11 5 - 15  Amylase     Status: None   Collection Time: 11/02/20 11:31 PM  Result Value Ref Range   Amylase 54 28 - 100 U/L  Lipase, blood     Status: None   Collection Time: 11/02/20 11:31 PM  Result Value Ref Range   Lipase 28 11 - 51 U/L    O/Positive/-- (11/03 0000)  Imaging:  US ABDOMEN LIMITED RUQ (LIVER/GB)  Result Date: 11/03/2020 CLINICAL DATA:  Pregnant with abdominal pain. EXAM: ULTRASOUND ABDOMEN LIMITED RIGHT UPPER QUADRANT COMPARISON:  None. FINDINGS: Gallbladder: Shadowing echogenic gallstones are seen within the gallbladder lumen. The largest measures approximately 1.2  cm. No gallbladder wall thickening is seen (2 mm). A positive sonographic Eulah Pont sign was noted by sonographer. Common bile duct: Diameter: 4.6 mm Liver: No focal lesion identified. Within normal limits in parenchymal echogenicity. Portal vein is patent on color Doppler imaging with normal direction of blood flow towards the liver. Other: None. IMPRESSION: Cholelithiasis with additional findings consistent with acute cholecystitis. Electronically Signed   By: Aram Candela M.D.   On: 11/03/2020 01:36     MAU Course/MDM: I have ordered labs and reviewed results. No leukocytosis, transaminases WNL, Amylase and Lipase are also WNL NST reviewed, reactive, category I Consult Dr Vergie Living and Dr Bedelia Person (Surgery) with presentation, exam findings and test results.  Treatments in MAU included IV fluids, Levsin, Simethicone which produced some relief Also gave her small dose of Dilaudid  Surgery recommends observation/admission probably less than 24 hours She recommends antibiotics, low fat diet and repeating labs.  Assessment: Single iUP at [redacted]w[redacted]d Cholelithiasis with cholecystitis  Cholestasis Gestational Diabetes  Plan: Admit for observation in OBSCU Ceftriaxone and Flagyl  Repeat labs at [facility] MD to follow Message sent to office to obtain surgical consult, Dr Bedelia Person will arrange from her Earnest Mcgillis also.  Wynelle Bourgeois CNM, MSN Certified Nurse-Midwife 11/02/2020 11:27 PM

## 2020-11-02 NOTE — MAU Note (Signed)
Having pain in upper abdomen that radiates to back. Feels like gallbladder pain. Last ate about 2000. Denies VB or LOF. NO pregnancy concerns.

## 2020-11-03 ENCOUNTER — Inpatient Hospital Stay (HOSPITAL_COMMUNITY): Payer: Self-pay

## 2020-11-03 DIAGNOSIS — O320XX0 Maternal care for unstable lie, not applicable or unspecified: Secondary | ICD-10-CM

## 2020-11-03 DIAGNOSIS — K8000 Calculus of gallbladder with acute cholecystitis without obstruction: Secondary | ICD-10-CM

## 2020-11-03 DIAGNOSIS — K810 Acute cholecystitis: Secondary | ICD-10-CM | POA: Diagnosis present

## 2020-11-03 DIAGNOSIS — Z603 Acculturation difficulty: Secondary | ICD-10-CM

## 2020-11-03 HISTORY — DX: Maternal care for unstable lie, not applicable or unspecified: O32.0XX0

## 2020-11-03 LAB — CBC
HCT: 28.7 % — ABNORMAL LOW (ref 36.0–46.0)
Hemoglobin: 9.5 g/dL — ABNORMAL LOW (ref 12.0–15.0)
MCH: 28.5 pg (ref 26.0–34.0)
MCHC: 33.1 g/dL (ref 30.0–36.0)
MCV: 86.2 fL (ref 80.0–100.0)
Platelets: 302 10*3/uL (ref 150–400)
RBC: 3.33 MIL/uL — ABNORMAL LOW (ref 3.87–5.11)
RDW: 13.3 % (ref 11.5–15.5)
WBC: 9.7 10*3/uL (ref 4.0–10.5)
nRBC: 0 % (ref 0.0–0.2)

## 2020-11-03 LAB — RESP PANEL BY RT-PCR (FLU A&B, COVID) ARPGX2
Influenza A by PCR: NEGATIVE
Influenza B by PCR: NEGATIVE
SARS Coronavirus 2 by RT PCR: NEGATIVE

## 2020-11-03 LAB — COMPREHENSIVE METABOLIC PANEL
ALT: 21 U/L (ref 0–44)
ALT: 31 U/L (ref 0–44)
AST: 29 U/L (ref 15–41)
AST: 34 U/L (ref 15–41)
Albumin: 2.1 g/dL — ABNORMAL LOW (ref 3.5–5.0)
Albumin: 2.4 g/dL — ABNORMAL LOW (ref 3.5–5.0)
Alkaline Phosphatase: 243 U/L — ABNORMAL HIGH (ref 38–126)
Alkaline Phosphatase: 265 U/L — ABNORMAL HIGH (ref 38–126)
Anion gap: 11 (ref 5–15)
Anion gap: 8 (ref 5–15)
BUN: 7 mg/dL (ref 6–20)
BUN: 9 mg/dL (ref 6–20)
CO2: 20 mmol/L — ABNORMAL LOW (ref 22–32)
CO2: 20 mmol/L — ABNORMAL LOW (ref 22–32)
Calcium: 8.4 mg/dL — ABNORMAL LOW (ref 8.9–10.3)
Calcium: 8.8 mg/dL — ABNORMAL LOW (ref 8.9–10.3)
Chloride: 103 mmol/L (ref 98–111)
Chloride: 106 mmol/L (ref 98–111)
Creatinine, Ser: 0.47 mg/dL (ref 0.44–1.00)
Creatinine, Ser: 0.66 mg/dL (ref 0.44–1.00)
GFR, Estimated: >60 mL/min (ref >60)
GFR, Estimated: >60 mL/min (ref >60)
Glucose, Bld: 120 mg/dL — ABNORMAL HIGH (ref 70–99)
Glucose, Bld: 138 mg/dL — ABNORMAL HIGH (ref 70–99)
Potassium: 3.7 mmol/L (ref 3.5–5.1)
Potassium: 3.8 mmol/L (ref 3.5–5.1)
Sodium: 134 mmol/L — ABNORMAL LOW (ref 135–145)
Sodium: 134 mmol/L — ABNORMAL LOW (ref 135–145)
Total Bilirubin: 0.3 mg/dL (ref 0.3–1.2)
Total Bilirubin: 0.6 mg/dL (ref 0.3–1.2)
Total Protein: 5.7 g/dL — ABNORMAL LOW (ref 6.5–8.1)
Total Protein: 6.1 g/dL — ABNORMAL LOW (ref 6.5–8.1)

## 2020-11-03 LAB — AMYLASE
Amylase: 41 U/L (ref 28–100)
Amylase: 54 U/L (ref 28–100)

## 2020-11-03 LAB — GLUCOSE, CAPILLARY
Glucose-Capillary: 135 mg/dL — ABNORMAL HIGH (ref 70–99)
Glucose-Capillary: 114 mg/dL — ABNORMAL HIGH (ref 70–99)
Glucose-Capillary: 105 mg/dL — ABNORMAL HIGH (ref 70–99)

## 2020-11-03 LAB — LIPASE, BLOOD
Lipase: 23 U/L (ref 11–51)
Lipase: 28 U/L (ref 11–51)

## 2020-11-03 MED ORDER — PANTOPRAZOLE SODIUM 20 MG PO TBEC
20.0000 mg | DELAYED_RELEASE_TABLET | Freq: Two times a day (BID) | ORAL | Status: DC
  Administered 2020-11-03 – 2020-11-04 (×3): 20 mg via ORAL
  Filled 2020-11-03 (×4): qty 1

## 2020-11-03 MED ORDER — METRONIDAZOLE IN NACL 5-0.79 MG/ML-% IV SOLN
500.0000 mg | Freq: Three times a day (TID) | INTRAVENOUS | Status: DC
  Administered 2020-11-03 – 2020-11-04 (×5): 500 mg via INTRAVENOUS
  Filled 2020-11-03 (×7): qty 100

## 2020-11-03 MED ORDER — SIMETHICONE 80 MG PO CHEW: 80.0000 mg | CHEWABLE_TABLET | Freq: Four times a day (QID) | ORAL | Status: DC | PRN

## 2020-11-03 MED ORDER — DOCUSATE SODIUM 100 MG PO CAPS
100.0000 mg | ORAL_CAPSULE | Freq: Every day | ORAL | Status: DC
  Administered 2020-11-03 – 2020-11-04 (×2): 100 mg via ORAL
  Filled 2020-11-03 (×2): qty 1

## 2020-11-03 MED ORDER — ZOLPIDEM TARTRATE 5 MG PO TABS: 5.0000 mg | ORAL_TABLET | Freq: Every evening | ORAL | Status: DC | PRN

## 2020-11-03 MED ORDER — HYDROMORPHONE HCL 1 MG/ML IJ SOLN
0.5000 mg | INTRAMUSCULAR | Status: DC | PRN
  Administered 2020-11-03: 0.5 mg via INTRAVENOUS
  Administered 2020-11-03: 1 mg via INTRAVENOUS
  Filled 2020-11-03 (×2): qty 1

## 2020-11-03 MED ORDER — SODIUM CHLORIDE 0.9 % IV SOLN
2.0000 g | INTRAVENOUS | Status: DC
  Administered 2020-11-03 – 2020-11-04 (×2): 2 g via INTRAVENOUS
  Filled 2020-11-03 (×3): qty 20

## 2020-11-03 MED ORDER — CALCIUM CARBONATE ANTACID 500 MG PO CHEW: 2.0000 | CHEWABLE_TABLET | ORAL | Status: DC | PRN

## 2020-11-03 MED ORDER — ACETAMINOPHEN 325 MG PO TABS
650.0000 mg | ORAL_TABLET | ORAL | Status: DC | PRN
  Administered 2020-11-03 – 2020-11-04 (×2): 650 mg via ORAL
  Filled 2020-11-03 (×2): qty 2

## 2020-11-03 MED ORDER — DIPHENHYDRAMINE HCL 25 MG PO CAPS
25.0000 mg | ORAL_CAPSULE | ORAL | Status: DC | PRN
  Administered 2020-11-03: 25 mg via ORAL
  Filled 2020-11-03: qty 1

## 2020-11-03 MED ORDER — PRENATAL MULTIVITAMIN CH
1.0000 | ORAL_TABLET | Freq: Every day | ORAL | Status: DC
  Administered 2020-11-03 – 2020-11-04 (×2): 1 via ORAL
  Filled 2020-11-03 (×2): qty 1

## 2020-11-03 MED ORDER — SODIUM CHLORIDE 0.9 % IV SOLN
INTRAVENOUS | Status: DC | PRN
  Administered 2020-11-03: 500 mL via INTRAVENOUS

## 2020-11-03 NOTE — Progress Notes (Signed)
FACULTY PRACTICE ANTEPARTUM PROGRESS NOTE  [name] is a 40 y.o. [id number] at [redacted]w[redacted]d who is admitted for cholestasis of pregnancy and cholecystitis.  Estimated Date of Delivery: 11/28/20 Fetal presentation is cephalic.  Length of Stay:  0 Days. Admitted 11/02/2020  Subjective: Pt seen, she is doing well.  She does note diffuse mild pruritis.  The patient still notes intermittent right upper quadrant pain of varying intensity. Patient reports normal fetal movement.  She denies uterine contractions, denies bleeding and leaking of fluid per vagina.  Vitals:  Blood pressure 140/73, pulse 90, temperature 98 F (36.7 C), temperature source Oral, resp. rate 18, height 5\' 2"  (1.575 m), weight 106.1 kg, last menstrual period 02/22/2020, SpO2 100 %, currently breastfeeding. Physical Examination: CONSTITUTIONAL: Well-developed, well-nourished female in no acute distress.  HENT:  Normocephalic, atraumatic, External right and left ear normal. Oropharynx is clear and moist EYES: Conjunctivae and EOM are normal. Pupils are equal, round, and reactive to light. No scleral icterus.  NECK: Normal range of motion, supple, no masses. SKIN: Skin is warm and dry. No rash noted. Not diaphoretic. No erythema. No pallor. NEUROLGIC: Alert and oriented to person, place, and time. Normal reflexes, muscle tone coordination. No cranial nerve deficit noted. PSYCHIATRIC: Normal mood and affect. Normal behavior. Normal judgment and thought content. CARDIOVASCULAR: Normal heart rate noted, regular rhythm RESPIRATORY: Effort and breath sounds normal, no problems with respiration noted MUSCULOSKELETAL: Normal range of motion. No edema and no tenderness. ABDOMEN: Soft, mild tenderness in right upper quadrant, nondistended, gravid, obese CERVIX: deferred  Fetal monitoring: FHR: 130s bpm, Variability: moderate, Accelerations: Present, Decelerations: Absent , category 1 strip, reactive Uterine activity: none   Results for  orders placed or performed during the hospital encounter of 11/02/20 (from the past 48 hour(s))  CBC     Status: Abnormal   Collection Time: 11/02/20 11:31 PM  Result Value Ref Range   WBC 9.7 4.0 - 10.5 K/uL   RBC 3.33 (L) 3.87 - 5.11 MIL/uL   Hemoglobin 9.5 (L) 12.0 - 15.0 g/dL   HCT 11/04/20 (L) 35.3 - 61.4 %   MCV 86.2 80.0 - 100.0 fL   MCH 28.5 26.0 - 34.0 pg   MCHC 33.1 30.0 - 36.0 g/dL   RDW 43.1 54.0 - 08.6 %   Platelets 302 150 - 400 K/uL   nRBC 0.0 0.0 - 0.2 %    Comment: Performed at [facility], [address] [place], [place]  Comprehensive metabolic panel     Status: Abnormal   Collection Time: 11/02/20 11:31 PM  Result Value Ref Range   Sodium 134 (L) 135 - 145 mmol/L   Potassium 3.8 3.5 - 5.1 mmol/L   Chloride 103 98 - 111 mmol/L   CO2 20 (L) 22 - 32 mmol/L   Glucose, Bld 138 (H) 70 - 99 mg/dL    Comment: Glucose reference range applies only to samples taken after fasting for at least 8 hours.   BUN 9 6 - 20 mg/dL   Creatinine, Ser 11/04/20 0.44 - 1.00 mg/dL   Calcium 8.8 (L) 8.9 - 10.3 mg/dL   Total Protein 6.1 (L) 6.5 - 8.1 g/dL   Albumin 2.4 (L) 3.5 - 5.0 g/dL   AST 29 15 - 41 U/L   ALT 21 0 - 44 U/L   Alkaline Phosphatase 265 (H) 38 - 126 U/L   Total Bilirubin 0.6 0.3 - 1.2 mg/dL   GFR, Estimated 2.67 >12 mL/min    Comment: (  NOTE) Calculated using the CKD-EPI Creatinine Equation (2021)    Anion gap 11 5 - 15    Comment: Performed at [facility], [address], [place], [place] [address]  Amylase     Status: None   Collection Time: 11/02/20 11:31 PM  Result Value Ref Range   Amylase 54 28 - 100 U/L    Comment: Performed at [facility], [address] [place], [place] [address]  Lipase, blood     Status: None   Collection Time: 11/02/20 11:31 PM  Result Value Ref Range   Lipase 28 11 - 51 U/L    Comment: Performed at [facility], [address] [place] [address], [place], [place] [address]  Resp Panel by RT-PCR (Flu A&B, Covid)  Nasopharyngeal Swab     Status: None   Collection Time: 11/03/20  2:19 AM   Specimen: Nasopharyngeal Swab; Nasopharyngeal(NP) swabs in vial transport medium  Result Value Ref Range   SARS Coronavirus 2 by RT PCR NEGATIVE NEGATIVE    Comment: (NOTE) SARS-CoV-2 target nucleic acids are NOT DETECTED.  The SARS-CoV-2 RNA is generally detectable in upper respiratory specimens during the acute phase of infection. The lowest concentration of SARS-CoV-2 viral copies this assay can detect is 138 copies/mL. A negative result does not preclude SARS-Cov-2 infection and should not be used as the sole basis for treatment or other patient management decisions. A negative result may occur with  improper specimen collection/handling, submission of specimen other than nasopharyngeal swab, presence of viral mutation(s) within the areas targeted by this assay, and inadequate number of viral copies(<138 copies/mL). A negative result must be combined with clinical observations, patient history, and epidemiological information. The expected result is Negative.  Fact Sheet for Patients:  BloggerCourse.com  Fact Sheet for Healthcare Providers:  SeriousBroker.it  This test is no t yet approved or cleared by the [place] FDA and  has been authorized for detection and/or diagnosis of SARS-CoV-2 by FDA under an Emergency Use Authorization (EUA). This EUA will remain  in effect (meaning this test can be used) for the duration of the COVID-19 declaration under Section 564(b)(1) of the Act, 21 U.S.C.section 360bbb-3(b)(1), unless the authorization is terminated  or revoked sooner.       Influenza A by PCR NEGATIVE NEGATIVE   Influenza B by PCR NEGATIVE NEGATIVE    Comment: (NOTE) The Xpert Xpress SARS-CoV-2/FLU/RSV plus assay is intended as an aid in the diagnosis of influenza from Nasopharyngeal swab specimens and should not be used as a sole basis for  treatment. Nasal washings and aspirates are unacceptable for Xpert Xpress SARS-CoV-2/FLU/RSV testing.  Fact Sheet for Patients: BloggerCourse.com  Fact Sheet for Healthcare Providers: SeriousBroker.it  This test is not yet approved or cleared by the [place] FDA and has been authorized for detection and/or diagnosis of SARS-CoV-2 by FDA under an Emergency Use Authorization (EUA). This EUA will remain in effect (meaning this test can be used) for the duration of the COVID-19 declaration under Section 564(b)(1) of the Act, 21 U.S.C. section 360bbb-3(b)(1), unless the authorization is terminated or revoked.  Performed at [facility], [address] [place], [place] [address]   Glucose, capillary     Status: Abnormal   Collection Time: 11/03/20  5:04 AM  Result Value Ref Range   Glucose-Capillary 135 (H) 70 - 99 mg/dL    Comment: Glucose reference range applies only to samples taken after fasting for at least 8 hours.  Glucose,  capillary     Status: Abnormal   Collection Time: 11/03/20 10:21 AM  Result Value Ref Range   Glucose-Capillary 114 (H) 70 - 99 mg/dL    Comment: Glucose reference range applies only to samples taken after fasting for at least 8 hours.  Comprehensive metabolic panel     Status: Abnormal   Collection Time: 11/03/20 11:37 AM  Result Value Ref Range   Sodium 134 (L) 135 - 145 mmol/L   Potassium 3.7 3.5 - 5.1 mmol/L   Chloride 106 98 - 111 mmol/L   CO2 20 (L) 22 - 32 mmol/L   Glucose, Bld 120 (H) 70 - 99 mg/dL    Comment: Glucose reference range applies only to samples taken after fasting for at least 8 hours.   BUN 7 6 - 20 mg/dL   Creatinine, Ser 1.82 0.44 - 1.00 mg/dL   Calcium 8.4 (L) 8.9 - 10.3 mg/dL   Total Protein 5.7 (L) 6.5 - 8.1 g/dL   Albumin 2.1 (L) 3.5 - 5.0 g/dL   AST 34 15 - 41 U/L   ALT 31 0 - 44 U/L   Alkaline Phosphatase 243 (H) 38 - 126 U/L   Total Bilirubin  0.3 0.3 - 1.2 mg/dL   GFR, Estimated >99 >37 mL/min    Comment: (NOTE) Calculated using the CKD-EPI Creatinine Equation (2021)    Anion gap 8 5 - 15    Comment: Performed at [facility], [address] [place], [place] [address]  Amylase     Status: None   Collection Time: 11/03/20 11:37 AM  Result Value Ref Range   Amylase 41 28 - 100 U/L    Comment: Performed at [facility], [address] [place], [place] [address]  Lipase, blood     Status: None   Collection Time: 11/03/20 11:37 AM  Result Value Ref Range   Lipase 23 11 - 51 U/L    Comment: Performed at [facility], [address] [place], [place] [address]    I have reviewed the patient's current medications.  ASSESSMENT: Active Problems:   Language barrier   Cholelithiasis   Cholestasis of pregnancy   Cholecystitis, acute   PLAN: Continue antibiotics for now. Appreciate Surgery assistance.  Awaiting decision on post delivery cholecystectomy. Awaiting return of bile acids, other liver labs currently normal. It may be more efficient to keep patient in house since she is being delivered on on 2/28 anayway   Continue routine antenatal care.   Mariel Aloe, MD [facility] Faculty Attending, [facility] 11/03/2020 2:02 PM

## 2020-11-03 NOTE — Consult Note (Signed)
Reason for Consult/Chief Complaint: cholecystitis Consultant: Mayford Knife, CNM  [name] is an 40 y.o. female.   HPI: 16F G9P5 at 36+3 p/w abdominal pain, nausea, and vomiting x1d. Pain localizes to RUQ and R shoulder. Previous sx ~2-3w ago and has had biliary colic with multiple prior pregnancies. No changes to bowels, no fevers. All prior deliveries via SVD, scheduled for induction at 37w on 2/28. No prior abdominal surgery.  Past Medical History:  Diagnosis Date  . Anxiety    relative to circumstances to back problems/injury, not taking any medicine, pt. reports has been crying a lot  . Arthritis    low back  . Asthma   . Back pain   . Cholelithiasis 04/08/2020  . Depression   . GERD (gastroesophageal reflux disease)   . Migraines   . Neck pain     Past Surgical History:  Procedure Laterality Date  . childbirth     x4 vaginal   . LUMBAR LAMINECTOMY/DECOMPRESSION MICRODISCECTOMY  06/05/2012   Procedure: LUMBAR LAMINECTOMY/DECOMPRESSION MICRODISCECTOMY;  Surgeon: Emilee Hero, MD;  Location: [facility] OR;  Service: Orthopedics;  Laterality: Left;  Left sided lumbar 4-5 microdisectomy    Family History  Problem Relation Age of Onset  . Asthma Brother     Social History:  reports that she has never smoked. She has never used smokeless tobacco. She reports that she does not drink alcohol and does not use drugs.  Allergies:  Allergies  Allergen Reactions  . Hydrocodone-Acetaminophen Itching    Has some itching on face & nose but not bad enough to stop taking it/ MD aware and instructed patient to take anti-allergy medication prn.    Medications: I have reviewed the patient's current medications.  Results for orders placed or performed during the hospital encounter of 11/02/20 (from the past 48 hour(s))  CBC     Status: Abnormal   Collection Time: 11/02/20 11:31 PM  Result Value Ref Range   WBC 9.7 4.0 - 10.5 K/uL   RBC 3.33 (L) 3.87 - 5.11 MIL/uL   Hemoglobin  9.5 (L) 12.0 - 15.0 g/dL   HCT 38.1 (L) 01.7 - 51.0 %   MCV 86.2 80.0 - 100.0 fL   MCH 28.5 26.0 - 34.0 pg   MCHC 33.1 30.0 - 36.0 g/dL   RDW 25.8 52.7 - 78.2 %   Platelets 302 150 - 400 K/uL   nRBC 0.0 0.0 - 0.2 %    Comment: Performed at [facility], [address] [place], [place] [address]  Comprehensive metabolic panel     Status: Abnormal   Collection Time: 11/02/20 11:31 PM  Result Value Ref Range   Sodium 134 (L) 135 - 145 mmol/L   Potassium 3.8 3.5 - 5.1 mmol/L   Chloride 103 98 - 111 mmol/L   CO2 20 (L) 22 - 32 mmol/L   Glucose, Bld 138 (H) 70 - 99 mg/dL    Comment: Glucose reference range applies only to samples taken after fasting for at least 8 hours.   BUN 9 6 - 20 mg/dL   Creatinine, Ser 6.14 0.44 - 1.00 mg/dL   Calcium 8.8 (L) 8.9 - 10.3 mg/dL   Total Protein 6.1 (L) 6.5 - 8.1 g/dL   Albumin 2.4 (L) 3.5 - 5.0 g/dL   AST 29 15 - 41 U/L   ALT 21 0 - 44 U/L   Alkaline Phosphatase 265 (H) 38 - 126 U/L   Total Bilirubin 0.6 0.3 - 1.2  mg/dL   GFR, Estimated >65 >99 mL/min    Comment: (NOTE) Calculated using the CKD-EPI Creatinine Equation (2021)    Anion gap 11 5 - 15    Comment: Performed at [facility], [address], [place], [place] [address]  Amylase     Status: None   Collection Time: 11/02/20 11:31 PM  Result Value Ref Range   Amylase 54 28 - 100 U/L    Comment: Performed at [facility], [address], [place], [place] [address]  Lipase, blood     Status: None   Collection Time: 11/02/20 11:31 PM  Result Value Ref Range   Lipase 28 11 - 51 U/L    Comment: Performed at [facility], [address], [place], [place] [address]  Glucose, capillary     Status: Abnormal   Collection Time: 11/03/20  5:04 AM  Result Value Ref Range   Glucose-Capillary 135 (H) 70 - 99 mg/dL    Comment: Glucose reference range applies only to samples taken after fasting for at least 8 hours.    US ABDOMEN LIMITED RUQ (LIVER/GB)  Result Date:  11/03/2020 CLINICAL DATA:  Pregnant with abdominal pain. EXAM: ULTRASOUND ABDOMEN LIMITED RIGHT UPPER QUADRANT COMPARISON:  None. FINDINGS: Gallbladder: Shadowing echogenic gallstones are seen within the gallbladder lumen. The largest measures approximately 1.2 cm. No gallbladder wall thickening is seen (2 mm). A positive sonographic [name] sign was noted by sonographer. Common bile duct: Diameter: 4.6 mm Liver: No focal lesion identified. Within normal limits in parenchymal echogenicity. Portal vein is patent on color Doppler imaging with normal direction of blood flow towards the liver. Other: None. IMPRESSION: Cholelithiasis with additional findings consistent with acute cholecystitis. Electronically Signed   By: Aram Candela M.D.   On: 11/03/2020 01:36    ROS 10 point review of systems is negative except as listed above in HPI.   Physical Exam Blood pressure 122/84, pulse 86, temperature 98.2 F (36.8 C), temperature source Oral, resp. rate 19, height 5\' 2"  (1.575 m), weight 106.1 kg, last menstrual period 02/22/2020, SpO2 99 %, currently breastfeeding. Constitutional: well-developed, well-nourished HEENT: pupils equal, round, reactive to light, 14mm b/l, moist conjunctiva, external inspection of ears and nose normal, hearing intact Oropharynx: normal oropharyngeal mucosa, normal dentition Neck: no thyromegaly, trachea midline, no midline cervical tenderness to palpation Chest: breath sounds equal bilaterally, normal respiratory effort, no midline or lateral chest wall tenderness to palpation/deformity Abdomen: soft, gravid, +RUQ TTP, no bruising, no hepatosplenomegaly GU: normal female genitalia  Back: no wounds, no thoracic/lumbar spine tenderness to palpation, no thoracic/lumbar spine stepoffs Rectal: deferred Extremities: 2+ radial and pedal pulses bilaterally, motor and sensation intact to bilateral UE and LE, + trace peripheral edema MSK: unable to assess gait/station, no  clubbing/cyanosis of fingers/toes, normal ROM of all four extremities Skin: warm, dry, no rashes Psych: normal memory, normal mood/affect    Assessment/Plan: 27F G9P5 with scheduled induction for planned vaginal delivery 2/28 with symptomatic cholelithiasis and radiographic evidence of acute cholecystitis.   Normal WBC and LFTs. Would avoid lap chole in this setting due to gravid uterus. PO trial today. CTX/flagyl given as single dose. Lives 66m from the hospital and seems to be reliable for close f/u. Could consider lap chole during admission for induction and delivery, will d/w Dr. 4m who will be on beginning 2/28.    3/28, MD General and Trauma Surgery [facility]

## 2020-11-03 NOTE — MAU Note (Signed)
RN observed CNM discuss lab and imaging results with patient using video interpreter [phone].  Patient expressed understanding and agreed to admission for inpatient care and treatment.

## 2020-11-03 NOTE — H&P (Signed)
Chief Complaint:  Abdominal Pain and Back Pain   Event Date/Time   First Provider Initiated Contact with Patient 11/02/20 2327     Interpretor used, but patient understands fair amount of English  HPI: [name] is a 40 y.o. [id number] at 31w2dwho presents to maternity admissions reporting pain in RUQ radiating to her back. No vomiting. Similar to prior episodes. Known cholelithiasis and cholestasis. . She reports good fetal movement, denies LOF, vaginal bleeding, vaginal itching/burning, urinary symptoms, h/a, dizziness, n/v, diarrhea, constipation or fever/chills.   She is scheduled for Induction of Labor on Monday for cholestasis.  .Also has Gestational DM.  Abdominal Pain This is a recurrent problem. The current episode started today. The problem occurs constantly. The pain is located in the RUQ. The quality of the pain is cramping, colicky and sharp. The abdominal pain radiates to the back. Pertinent negatives include no anorexia, constipation, diarrhea, dysuria, fever, nausea or vomiting. The pain is aggravated by palpation. The pain is relieved by nothing. She has tried nothing for the symptoms.   RN Note: Having pain in upper abdomen that radiates to back. Feels like gallbladder pain. Last ate about 2000. Denies VB or LOF. NO pregnancy concerns  Past Medical History: Past Medical History:  Diagnosis Date  . Anxiety    relative to circumstances to back problems/injury, not taking any medicine, pt. reports has been crying a lot  . Arthritis    low back  . Asthma   . Back pain   . Cholelithiasis 04/08/2020  . Depression   . GERD (gastroesophageal reflux disease)   . Migraines   . Neck pain     Past obstetric history: OB History  Gravida Para Term Preterm AB Living  9 5 5  0 3 5  SAB IAB Ectopic Multiple Live Births  0 2 1 0 5    # Outcome Date GA Lbr Len/2nd Weight Sex Delivery Anes PTL Lv  9 Current           8 Term 12/27/18 [redacted]w[redacted]d 08:45 / 00:19 3980 g M Vag-Spont EPI   LIV  7 Ectopic 11/2017 [redacted]w[redacted]d         6 IAB 2016 [redacted]w[redacted]d         5 IAB 2011 [redacted]w[redacted]d         4 Term 01/08/09 [redacted]w[redacted]d  3175 g F Vag-Spont EPI N LIV  3 Term 04/23/07 [redacted]w[redacted]d  3175 g M Vag-Spont EPI  LIV  2 Term 04/10/02 [redacted]w[redacted]d  3175 g F Vag-Spont EPI  LIV  1 Term 03/30/98 [redacted]w[redacted]d  3629 g M Vag-Spont EPI N LIV    Past Surgical History: Past Surgical History:  Procedure Laterality Date  . childbirth     x4 vaginal   . LUMBAR LAMINECTOMY/DECOMPRESSION MICRODISCECTOMY  06/05/2012   Procedure: LUMBAR LAMINECTOMY/DECOMPRESSION MICRODISCECTOMY;  Surgeon: 06/07/2012, MD;  Location: [facility] OR;  Service: Orthopedics;  Laterality: Left;  Left sided lumbar 4-5 microdisectomy    Family History: Family History  Problem Relation Age of Onset  . Asthma Brother     Social History: Social History   Tobacco Use  . Smoking status: Never Smoker  . Smokeless tobacco: Never Used  Vaping Use  . Vaping Use: Never used  Substance Use Topics  . Alcohol use: No  . Drug use: No    Allergies:  Allergies  Allergen Reactions  . Hydrocodone-Acetaminophen Itching    Has some itching on face & nose but not bad enough to stop taking it/  MD aware and instructed patient to take anti-allergy medication prn.    Meds:  Medications Prior to Admission  Medication Sig Dispense Refill Last Dose  . aspirin EC 81 MG tablet Take 1 tablet (81 mg total) by mouth daily. Take after 12 weeks for prevention of preeclampsia later in pregnancy 300 tablet 2 11/02/2020 at Unknown time  . Hyoscyamine Sulfate SL (LEVSIN/SL) 0.125 MG SUBL Place 0.125 mg under the tongue every 12 (twelve) hours as needed (abdominal gas pain). 30 tablet 0 11/02/2020 at Unknown time  . pantoprazole (PROTONIX) 20 MG tablet Take 1 tablet (20 mg total) by mouth daily. 30 tablet 0 11/02/2020 at Unknown time  . simethicone (GAS-X) 80 MG chewable tablet Chew 1 tablet (80 mg total) by mouth every 6 (six) hours as needed for flatulence. 30 tablet 0 11/02/2020 at  Unknown time  . acetaminophen (TYLENOL) 500 MG tablet Take 500 mg by mouth every 6 (six) hours as needed.     . albuterol (ACCUNEB) 0.63 MG/3ML nebulizer solution Take 1 ampule by nebulization every 6 (six) hours as needed for wheezing.     . ferrous sulfate 325 (65 FE) MG tablet Take 325 mg by mouth daily with breakfast.     . loratadine (CLARITIN) 10 MG tablet Take 10 mg by mouth daily. (Patient not taking: No sig reported)     . metFORMIN (GLUCOPHAGE) 500 MG tablet Take 1 tablet (500 mg total) by mouth 2 (two) times daily with a meal. 60 tablet 1   . ondansetron (ZOFRAN ODT) 4 MG disintegrating tablet Take 1 tablet (4 mg total) by mouth every 6 (six) hours as needed. (Patient not taking: No sig reported) 20 tablet 0   . Prenatal Vit-Fe Fumarate-FA (PRENATAL MULTIVITAMIN) TABS tablet Take 1 tablet by mouth daily at 12 noon.       I have reviewed patient's Past Medical Hx, Surgical Hx, Family Hx, Social Hx, medications and allergies.   ROS:  Review of Systems  Constitutional: Negative for chills and fever.  Respiratory: Negative for shortness of breath.   Gastrointestinal: Positive for abdominal pain. Negative for anorexia, constipation, diarrhea, nausea and vomiting.  Genitourinary: Negative for dysuria, pelvic pain and vaginal bleeding.  Musculoskeletal: Positive for back pain (referred from gallbladder).   Other systems negative  Physical Exam   Patient Vitals for the past 24 hrs:  Temp Resp SpO2 Height Weight  11/02/20 2308 - - 99 % - -  11/02/20 2301 98.2 F (36.8 C) 20 - 5' 2" (1.575 m) 106.1 kg   Constitutional: Well-developed, well-nourished female in no acute distress, but quite uncomfortable.  Cardiovascular: normal rate and rhythm Respiratory: normal effort, clear to auscultation bilaterally  GI: Abd soft, tender over RUQ,  No rebound but has some guarding.   Some bloating  Uterus  gravid appropriate for gestational age.   MS: Extremities nontender, no edema, normal  ROM Neurologic: Alert and oriented x 4.  GU: Neg CVAT.  PELVIC EXAM: deferred   FHT:  Baseline 140 , moderate variability, accelerations present, no decelerations Contractions: Uterine irritability   Labs: Results for orders placed or performed during the hospital encounter of 11/02/20 (from the past 24 hour(s))  CBC     Status: Abnormal   Collection Time: 11/02/20 11:31 PM  Result Value Ref Range   WBC 9.7 4.0 - 10.5 K/uL   RBC 3.33 (L) 3.87 - 5.11 MIL/uL   Hemoglobin 9.5 (L) 12.0 - 15.0 g/dL   HCT 28.7 (L) 36.0 - 46.0 %     MCV 86.2 80.0 - 100.0 fL   MCH 28.5 26.0 - 34.0 pg   MCHC 33.1 30.0 - 36.0 g/dL   RDW 13.3 11.5 - 15.5 %   Platelets 302 150 - 400 K/uL   nRBC 0.0 0.0 - 0.2 %  Comprehensive metabolic panel     Status: Abnormal   Collection Time: 11/02/20 11:31 PM  Result Value Ref Range   Sodium 134 (L) 135 - 145 mmol/L   Potassium 3.8 3.5 - 5.1 mmol/L   Chloride 103 98 - 111 mmol/L   CO2 20 (L) 22 - 32 mmol/L   Glucose, Bld 138 (H) 70 - 99 mg/dL   BUN 9 6 - 20 mg/dL   Creatinine, Ser 0.66 0.44 - 1.00 mg/dL   Calcium 8.8 (L) 8.9 - 10.3 mg/dL   Total Protein 6.1 (L) 6.5 - 8.1 g/dL   Albumin 2.4 (L) 3.5 - 5.0 g/dL   AST 29 15 - 41 U/L   ALT 21 0 - 44 U/L   Alkaline Phosphatase 265 (H) 38 - 126 U/L   Total Bilirubin 0.6 0.3 - 1.2 mg/dL   GFR, Estimated >60 >60 mL/min   Anion gap 11 5 - 15  Amylase     Status: None   Collection Time: 11/02/20 11:31 PM  Result Value Ref Range   Amylase 54 28 - 100 U/L  Lipase, blood     Status: None   Collection Time: 11/02/20 11:31 PM  Result Value Ref Range   Lipase 28 11 - 51 U/L    O/Positive/-- (11/03 0000)  Imaging:  US ABDOMEN LIMITED RUQ (LIVER/GB)  Result Date: 11/03/2020 CLINICAL DATA:  Pregnant with abdominal pain. EXAM: ULTRASOUND ABDOMEN LIMITED RIGHT UPPER QUADRANT COMPARISON:  None. FINDINGS: Gallbladder: Shadowing echogenic gallstones are seen within the gallbladder lumen. The largest measures approximately 1.2  cm. No gallbladder wall thickening is seen (2 mm). A positive sonographic Murphy sign was noted by sonographer. Common bile duct: Diameter: 4.6 mm Liver: No focal lesion identified. Within normal limits in parenchymal echogenicity. Portal vein is patent on color Doppler imaging with normal direction of blood flow towards the liver. Other: None. IMPRESSION: Cholelithiasis with additional findings consistent with acute cholecystitis. Electronically Signed   By: Thaddeus  Houston M.D.   On: 11/03/2020 01:36     MAU Course/MDM: I have ordered labs and reviewed results. No leukocytosis, transaminases WNL, Amylase and Lipase are also WNL NST reviewed, reactive, category I Consult Dr Pickens and Dr Lovick (Surgery) with presentation, exam findings and test results.  Treatments in MAU included IV fluids, Levsin, Simethicone which produced some relief Also gave her small dose of Dilaudid  Surgery recommends observation/admission probably less than 24 hours She recommends antibiotics, low fat diet and repeating labs.  Assessment: Single iUP at [redacted]w[redacted]d Cholelithiasis with cholecystitis  Cholestasis Gestational Diabetes  Plan: Admit for observation in OBSCU Ceftriaxone and Flagyl  Repeat labs at Noon MD to follow Message sent to office to obtain surgical consult, Dr Lovick will arrange from her end also.  Ocean Kearley CNM, MSN Certified Nurse-Midwife 11/02/2020 11:27 PM 

## 2020-11-03 NOTE — Care Management (Signed)
CM had consult for medication assistance. CM spoke to RN and plan is for induction on Monday 11/07/20.  If patient needs assistance with medications at discharge CM/TOC can assist with [facility] program which will assist with medications at discharge.  Please call CM closer to discharge.  CM will continue to follow.   Gretchen Short RNC-MNN, BSN Transitions of Care [facility]

## 2020-11-04 ENCOUNTER — Other Ambulatory Visit: Payer: Self-pay | Admitting: Obstetrics and Gynecology

## 2020-11-04 LAB — GLUCOSE, CAPILLARY
Glucose-Capillary: 104 mg/dL — ABNORMAL HIGH (ref 70–99)
Glucose-Capillary: 93 mg/dL (ref 70–99)
Glucose-Capillary: 110 mg/dL — ABNORMAL HIGH (ref 70–99)

## 2020-11-04 LAB — BILE ACIDS, TOTAL: Bile Acids Total: 14.1 umol/L — ABNORMAL HIGH (ref 0.0–10.0)

## 2020-11-04 MED ORDER — ONDANSETRON 4 MG PO TBDP: 4.0000 mg | ORAL_TABLET | Freq: Four times a day (QID) | ORAL | 0 refills | Status: DC | PRN

## 2020-11-04 MED ORDER — ONDANSETRON 4 MG PO TBDP
4.0000 mg | ORAL_TABLET | Freq: Four times a day (QID) | ORAL | Status: DC | PRN
  Administered 2020-11-04: 4 mg via ORAL
  Filled 2020-11-04: qty 1

## 2020-11-04 MED ORDER — AMOXICILLIN-POT CLAVULANATE 875-125 MG PO TABS: 1.0000 | ORAL_TABLET | Freq: Two times a day (BID) | ORAL | 0 refills | Status: DC

## 2020-11-04 MED ORDER — PANTOPRAZOLE SODIUM 20 MG PO TBEC: 20.0000 mg | DELAYED_RELEASE_TABLET | Freq: Two times a day (BID) | ORAL | 1 refills | Status: DC

## 2020-11-04 NOTE — Discharge Summary (Signed)
Physician Discharge Summary  Patient ID: [name] MRN: [id number] DOB/AGE: [birth year] 40 y.o.  Admit date: 11/02/2020 Discharge date: 11/04/2020  Admission Diagnoses: Cholelithiasis, cholestasis of pregnancy and acute cholecystitis  Discharge Diagnoses:  Active Problems:   Language barrier   Cholelithiasis   Cholestasis of pregnancy   Cholecystitis, acute   Discharged Condition: good  Hospital Course: Pt admitted through MAU with RUQ pain.  Pt had a previous diagnosis of cholestasis of pregnancy but was diagnosed with cholecystitis of pregnancy by RUQ u/s and clinical findings.  Surgery consult was procured and recommendations for bowel rest and IV antibiotics given.  Pt was seen by surgery and it was recommended that she have cholecystectomy after delivery.  Pt is scheduled for early IOL due to her cholestasis.  Pt received IV flagyl and rocephin and gradually improved.  On HD 2 pt had no right upper quadrant pain and felt much better.  Pt requested to go home even though she will return late Sunday night to start her induction of labor.  Pt will be scheduled for further surgical assessment after she is delivered.  Consults: general surgery  Significant Diagnostic Studies: radiology: Ultrasound: RUQ u/s  Treatments: antibiotics: ceftriaxone and metronidazole  Discharge Exam: Blood pressure 109/61, pulse 78, temperature 98.1 F (36.7 C), temperature source Oral, resp. rate 18, height 5\' 2"  (1.575 m), weight 106.1 kg, last menstrual period 02/22/2020, SpO2 98 %, currently breastfeeding. General appearance: alert, cooperative, no distress and mildly obese Head: Normocephalic, without obvious abnormality, atraumatic Resp: clear to auscultation bilaterally Cardio: regular rate and rhythm Extremities: extremities normal, atraumatic, no cyanosis or edema and Homans sign is negative, no sign of DVT Skin: Skin color, texture, turgor normal. No rashes or lesions  Abdomen: obese, NT/ND  no RUQ pain with deep palpation  Disposition: Discharge disposition: 01-Home or Self Care       Discharge Instructions    Discharge activity:  No Restrictions   Complete by: As directed    Discharge diet:   Complete by: As directed    Carb controlled for GDM   Fetal Kick Count:  Lie on our left side for one hour after a meal, and count the number of times your baby kicks.  If it is less than 5 times, get up, move around and drink some juice.  Repeat the test 30 minutes later.  If it is still less than 5 kicks in an hour, notify your doctor.   Complete by: As directed    LABOR:  When conractions begin, you should start to time them from the beginning of one contraction to the beginning  of the next.  When contractions are 5 - 10 minutes apart or less and have been regular for at least an hour, you should call your health care provider.   Complete by: As directed    No sexual activity restrictions   Complete by: As directed    Notify physician for bleeding from the vagina   Complete by: As directed    Notify physician for blurring of vision or spots before the eyes   Complete by: As directed    Notify physician for chills or fever   Complete by: As directed    Notify physician for fainting spells, "black outs" or loss of consciousness   Complete by: As directed    Notify physician for increase in vaginal discharge   Complete by: As directed    Notify physician for leaking of fluid   Complete by: As directed  Notify physician for pain or burning when urinating   Complete by: As directed    Notify physician for pelvic pressure (sudden increase)   Complete by: As directed    Notify physician for severe or continued nausea or vomiting   Complete by: As directed    Notify physician for sudden gushing of fluid from the vagina (with or without continued leaking)   Complete by: As directed    Notify physician for sudden, constant, or occasional abdominal pain   Complete by: As  directed    Notify physician if baby moving less than usual   Complete by: As directed      Allergies as of 11/04/2020      Reactions   Hydrocodone-acetaminophen Itching   Has some itching on face & nose but not bad enough to stop taking it/ MD aware and instructed patient to take anti-allergy medication prn.      Medication List    TAKE these medications   albuterol 0.63 MG/3ML nebulizer solution Commonly known as: ACCUNEB Take 1 ampule by nebulization every 6 (six) hours as needed for wheezing.   amoxicillin-clavulanate 875-125 MG tablet Commonly known as: AUGMENTIN Take 1 tablet by mouth 2 (two) times daily for 3 days.   aspirin EC 81 MG tablet Take 1 tablet (81 mg total) by mouth daily. Take after 12 weeks for prevention of preeclampsia later in pregnancy   ferrous sulfate 325 (65 FE) MG tablet Take 325 mg by mouth daily with breakfast.   Hyoscyamine Sulfate SL 0.125 MG Subl Commonly known as: Levsin/SL Place 0.125 mg under the tongue every 12 (twelve) hours as needed (abdominal gas pain).   metFORMIN 500 MG tablet Commonly known as: Glucophage Take 1 tablet (500 mg total) by mouth 2 (two) times daily with a meal.   ondansetron 4 MG disintegrating tablet Commonly known as: ZOFRAN-ODT Take 1 tablet (4 mg total) by mouth every 6 (six) hours as needed for nausea or vomiting.   pantoprazole 20 MG tablet Commonly known as: PROTONIX Take 1 tablet (20 mg total) by mouth 2 (two) times daily. What changed: when to take this   prenatal multivitamin Tabs tablet Take 1 tablet by mouth daily at 12 noon.       Follow-up Information    [facility] Follow up.   Specialty: Obstetrics and Gynecology Contact information: [address] [place] [address] [phone]              Signed: Warden Fillers 11/04/2020, 2:03 PM

## 2020-11-04 NOTE — Progress Notes (Signed)
Pt given discharge instructions. Pharmacy delivered new prescriptions. All questions answered. Iv discontinued.

## 2020-11-04 NOTE — Discharge Instructions (Signed)
Cholecystitis  Cholecystitis is inflammation of the gallbladder. It is often called a gallbladder attack. The gallbladder is a pear-shaped organ that lies beneath the liver on the right side of the body. The gallbladder stores bile, which is a fluid that helps the body digest fats. If bile builds up in your gallbladder, your gallbladder becomes inflamed. This condition may occur suddenly. Cholecystitis is a serious condition and requires treatment. What are the causes? The most common cause of this condition is gallstones. Gallstones can block the tube (duct) that carries bile out of your gallbladder. This causes bile to build up. Other causes include:  Damage to the gallbladder due to a decrease in blood flow.  Infections in the bile ducts.  Scars or kinks in the bile ducts.  Tumors in the liver, pancreas, or gallbladder. What increases the risk? You are more likely to develop this condition if:  You have sickle cell disease.  You take birth control pills or use estrogen.  You have alcoholic liver disease.  You have liver cirrhosis.  You have your nutrition delivered through a vein (parenteral nutrition).  You are critically ill.  You do not eat or drink for a long time. This is also called "fasting."  You are obese.  You lose weight too fast.  You are pregnant.  You have high levels of fat (triglycerides) in the blood.  You have pancreatitis. What are the signs or symptoms? Symptoms of this condition include:  Pain in the abdomen, especially in the upper right area of the abdomen.  Tenderness or bloating in the abdomen.  Nausea.  Vomiting.  Fever.  Chills. How is this diagnosed? This condition is diagnosed with a medical history and physical exam. You may also have other tests, including:  Imaging tests, such as: ? An ultrasound of the gallbladder. ? A CT scan of the abdomen. ? A gallbladder nuclear scan (HIDA scan). This scan allows your health care  provider to see the bile moving from your liver to your gallbladder and on to your small intestine. ? MRI.  Blood tests, such as: ? A complete blood count. The white blood cell count may be higher than normal. ? Liver function tests. Certain types of gallstones cause some results to be higher than normal.   How is this treated? Treatment may include:  Surgery to remove your gallbladder (cholecystectomy).  Antibiotic medicine, usually through an IV.  Fasting for a certain amount of time.  Giving IV fluids.  Medicine to treat pain or vomiting. Follow these instructions at home:  If you had surgery, follow instructions from your health care provider about home care after the procedure. Medicines  Take over-the-counter and prescription medicines only as told by your health care provider.  If you were prescribed an antibiotic medicine, take it as told by your health care provider. Do not stop taking the antibiotic even if you start to feel better.   General instructions  Follow instructions from your health care provider about what to eat or drink. When you are allowed to eat, avoid eating or drinking anything that triggers your symptoms.  Do not lift anything that is heavier than 10 lb (4.5 kg), or the limit that you are told, until your health care provider says that it is safe.  Do not use any products that contain nicotine or tobacco, such as cigarettes and e-cigarettes. If you need help quitting, ask your health care provider.  Keep all follow-up visits as told by your health care  provider. This is important. Contact a health care provider if:  Your pain is not controlled with medicine.  You have a fever. Get help right away if:  Your pain moves to another part of your abdomen or to your back.  You continue to have symptoms or you develop new symptoms even with treatment. Summary  Cholecystitis is inflammation of the gallbladder.  The most common cause of this  condition is gallstones. Gallstones can block the tube (duct) that carries bile out of your gallbladder.  Common symptoms are pain in the abdomen, nausea, vomiting, fever, and chills.  This condition is treated with surgery to remove the gallbladder, medicines, fasting, and IV fluids.  Follow your health care provider's instructions for eating and drinking. Avoid eating anything that triggers your symptoms. This information is not intended to replace advice given to you by your health care provider. Make sure you discuss any questions you have with your health care provider. Document Revised: 01/03/2018 Document Reviewed: 01/03/2018 Elsevier Patient Education  2021 [employer]. Labor Induction Labor induction is when steps are taken to cause a pregnant woman to begin the labor process. Most women go into labor on their own between 37 weeks and 42 weeks of pregnancy. When this does not happen, or when there is a medical need for labor to begin, steps may be taken to induce, or bring on, labor. Labor induction causes a pregnant woman's uterus to contract. It also causes the cervix to soften (ripen), open (dilate), and thin out. Usually, labor is not induced before 39 weeks of pregnancy unless there is a medical reason to do so. When is labor induction considered? Labor induction may be right for you if:  Your pregnancy lasts longer than 41 to 42 weeks.  Your placenta is separating from your uterus (placental abruption).  You have a rupture of membranes and your labor does not begin.  You have health problems, like diabetes or high blood pressure (preeclampsia) during your pregnancy.  Your baby has stopped growing or does not have enough amniotic fluid. Before labor induction begins, your health care provider will consider the following factors:  Your medical condition and the baby's condition.  How many weeks you have been pregnant.  How mature the baby's lungs are.  The condition of  your cervix.  The position of the baby.  The size of your birth canal. Tell a health care provider about:  Any allergies you have.  All medicines you are taking, including vitamins, herbs, eye drops, creams, and over-the-counter medicines.  Any problems you or your family members have had with anesthetic medicines.  Any surgeries you have had.  Any blood disorders you have.  Any medical conditions you have. What are the risks? Generally, this is a safe procedure. However, problems may occur, including:  Failed induction.  Changes in fetal heart rate, such as being too high, too low, or irregular (erratic).  Infection in the mother or the baby.  Increased risk of having a cesarean delivery.  Breaking off (abruption) of the placenta from the uterus. This is rare.  Rupture of the uterus. This is very rare.  Your baby could fail to get enough blood flow or oxygen. This can be life-threatening. When induction is needed for medical reasons, the benefits generally outweigh the risks. What happens during the procedure? During the procedure, your health care provider will use one of these methods to induce labor:  Stripping the membranes. In this method, the amniotic sac tissue is  gently separated from the cervix. This causes the following to happen: ? Your cervix stretches, which in turn causes the release of prostaglandins. ? Prostaglandins induce labor and cause the uterus to contract. ? This procedure is often done in an office visit. You will be sent home to wait for contractions to begin.  Prostaglandin medicine. This medicine starts contractions and causes the cervix to dilate and ripen. This can be taken by mouth (orally) or by being inserted into the vagina (suppository).  Inserting a small, thin tube (catheter) with a balloon into the vagina and then expanding the balloon with water to dilate the cervix.  Breaking the water. In this method, a small instrument is used  to make a small hole in the amniotic sac. This eventually causes the amniotic sac to break. Contractions should begin within a few hours.  Medicine to trigger or strengthen contractions. This medicine is given through an IV that is inserted into a vein in your arm. This procedure may vary among health care providers and hospitals.   Where to find more information  [employer]: www.marchofdimes.org  [employer]: www.acog.org Summary  Labor induction causes a pregnant woman's uterus to contract. It also causes the cervix to soften (ripen), open (dilate), and thin out.  Labor is usually not induced before 39 weeks of pregnancy unless there is a medical reason to do so.  When induction is needed for medical reasons, the benefits generally outweigh the risks.  Talk with your health care provider about which methods of labor induction are right for you. This information is not intended to replace advice given to you by your health care provider. Make sure you discuss any questions you have with your health care provider. Document Revised: 06/09/2020 Document Reviewed: 06/09/2020 Elsevier Patient Education  2021 [employer].

## 2020-11-05 ENCOUNTER — Other Ambulatory Visit (HOSPITAL_COMMUNITY)
Admission: RE | Admit: 2020-11-05 | Discharge: 2020-11-05 | Disposition: A | Discharge status: Home / Self Care | Payer: HRSA Program | Source: Ambulatory Visit | Attending: Obstetrics and Gynecology | Admitting: Obstetrics and Gynecology

## 2020-11-05 DIAGNOSIS — Z01812 Encounter for preprocedural laboratory examination: Secondary | ICD-10-CM | POA: Diagnosis present

## 2020-11-05 DIAGNOSIS — Z20822 Contact with and (suspected) exposure to covid-19: Secondary | ICD-10-CM | POA: Diagnosis not present

## 2020-11-05 LAB — SARS CORONAVIRUS 2 (TAT 6-24 HRS): SARS Coronavirus 2: NEGATIVE

## 2020-11-07 ENCOUNTER — Other Ambulatory Visit: Payer: Self-pay

## 2020-11-07 ENCOUNTER — Inpatient Hospital Stay (HOSPITAL_COMMUNITY): Payer: Medicaid Other | Admitting: Anesthesiology

## 2020-11-07 ENCOUNTER — Inpatient Hospital Stay (HOSPITAL_COMMUNITY): Payer: Medicaid Other

## 2020-11-07 ENCOUNTER — Encounter (HOSPITAL_COMMUNITY): Payer: Self-pay | Admitting: Family Medicine

## 2020-11-07 ENCOUNTER — Ambulatory Visit: Payer: Self-pay

## 2020-11-07 ENCOUNTER — Inpatient Hospital Stay (HOSPITAL_COMMUNITY)
Admission: AD | Admit: 2020-11-07 | Discharge: 2020-11-08 | DRG: 805 | Disposition: A | Discharge status: Home / Self Care | Payer: Medicaid Other | Attending: Family Medicine | Admitting: Family Medicine

## 2020-11-07 DIAGNOSIS — O09522 Supervision of elderly multigravida, second trimester: Secondary | ICD-10-CM

## 2020-11-07 DIAGNOSIS — O24425 Gestational diabetes mellitus in childbirth, controlled by oral hypoglycemic drugs: Secondary | ICD-10-CM | POA: Diagnosis present

## 2020-11-07 DIAGNOSIS — T50912A Poisoning by multiple unspecified drugs, medicaments and biological substances, intentional self-harm, initial encounter: Secondary | ICD-10-CM | POA: Diagnosis present

## 2020-11-07 DIAGNOSIS — O403XX0 Polyhydramnios, third trimester, not applicable or unspecified: Secondary | ICD-10-CM | POA: Diagnosis present

## 2020-11-07 DIAGNOSIS — R45851 Suicidal ideations: Secondary | ICD-10-CM

## 2020-11-07 DIAGNOSIS — E669 Obesity, unspecified: Secondary | ICD-10-CM | POA: Diagnosis not present

## 2020-11-07 DIAGNOSIS — K831 Obstruction of bile duct: Secondary | ICD-10-CM | POA: Diagnosis present

## 2020-11-07 DIAGNOSIS — O2662 Liver and biliary tract disorders in childbirth: Secondary | ICD-10-CM | POA: Diagnosis present

## 2020-11-07 DIAGNOSIS — F32A Depression, unspecified: Secondary | ICD-10-CM

## 2020-11-07 DIAGNOSIS — K810 Acute cholecystitis: Secondary | ICD-10-CM | POA: Diagnosis present

## 2020-11-07 DIAGNOSIS — O99345 Other mental disorders complicating the puerperium: Secondary | ICD-10-CM | POA: Diagnosis not present

## 2020-11-07 DIAGNOSIS — O99215 Obesity complicating the puerperium: Secondary | ICD-10-CM | POA: Diagnosis not present

## 2020-11-07 DIAGNOSIS — O3663X0 Maternal care for excessive fetal growth, third trimester, not applicable or unspecified: Secondary | ICD-10-CM | POA: Diagnosis present

## 2020-11-07 DIAGNOSIS — Z3A37 37 weeks gestation of pregnancy: Secondary | ICD-10-CM

## 2020-11-07 DIAGNOSIS — F4310 Post-traumatic stress disorder, unspecified: Secondary | ICD-10-CM | POA: Diagnosis not present

## 2020-11-07 DIAGNOSIS — O99214 Obesity complicating childbirth: Secondary | ICD-10-CM | POA: Diagnosis present

## 2020-11-07 DIAGNOSIS — O26619 Liver and biliary tract disorders in pregnancy, unspecified trimester: Secondary | ICD-10-CM | POA: Diagnosis present

## 2020-11-07 DIAGNOSIS — O26649 Intrahepatic cholestasis of pregnancy, unspecified trimester: Secondary | ICD-10-CM | POA: Diagnosis present

## 2020-11-07 DIAGNOSIS — Z789 Other specified health status: Secondary | ICD-10-CM | POA: Diagnosis present

## 2020-11-07 DIAGNOSIS — O24419 Gestational diabetes mellitus in pregnancy, unspecified control: Secondary | ICD-10-CM | POA: Diagnosis present

## 2020-11-07 DIAGNOSIS — O2663 Liver and biliary tract disorders in the puerperium: Secondary | ICD-10-CM | POA: Diagnosis not present

## 2020-11-07 DIAGNOSIS — J45909 Unspecified asthma, uncomplicated: Secondary | ICD-10-CM | POA: Diagnosis present

## 2020-11-07 DIAGNOSIS — O99210 Obesity complicating pregnancy, unspecified trimester: Secondary | ICD-10-CM | POA: Diagnosis present

## 2020-11-07 DIAGNOSIS — O9952 Diseases of the respiratory system complicating childbirth: Secondary | ICD-10-CM | POA: Diagnosis present

## 2020-11-07 DIAGNOSIS — Z603 Acculturation difficulty: Secondary | ICD-10-CM | POA: Diagnosis not present

## 2020-11-07 DIAGNOSIS — G8928 Other chronic postprocedural pain: Secondary | ICD-10-CM | POA: Diagnosis present

## 2020-11-07 DIAGNOSIS — O09529 Supervision of elderly multigravida, unspecified trimester: Secondary | ICD-10-CM

## 2020-11-07 DIAGNOSIS — O99893 Other specified diseases and conditions complicating puerperium: Secondary | ICD-10-CM | POA: Diagnosis not present

## 2020-11-07 DIAGNOSIS — K8020 Calculus of gallbladder without cholecystitis without obstruction: Secondary | ICD-10-CM | POA: Diagnosis present

## 2020-11-07 DIAGNOSIS — O26613 Liver and biliary tract disorders in pregnancy, third trimester: Secondary | ICD-10-CM

## 2020-11-07 LAB — COMPREHENSIVE METABOLIC PANEL
ALT: 28 U/L (ref 0–44)
AST: 28 U/L (ref 15–41)
Albumin: 2.5 g/dL — ABNORMAL LOW (ref 3.5–5.0)
Alkaline Phosphatase: 281 U/L — ABNORMAL HIGH (ref 38–126)
Anion gap: 10 (ref 5–15)
BUN: 12 mg/dL (ref 6–20)
CO2: 18 mmol/L — ABNORMAL LOW (ref 22–32)
Calcium: 8.7 mg/dL — ABNORMAL LOW (ref 8.9–10.3)
Chloride: 105 mmol/L (ref 98–111)
Creatinine, Ser: 0.66 mg/dL (ref 0.44–1.00)
GFR, Estimated: >60 mL/min (ref >60)
Glucose, Bld: 132 mg/dL — ABNORMAL HIGH (ref 70–99)
Potassium: 3.6 mmol/L (ref 3.5–5.1)
Sodium: 133 mmol/L — ABNORMAL LOW (ref 135–145)
Total Bilirubin: 0.5 mg/dL (ref 0.3–1.2)
Total Protein: 6.2 g/dL — ABNORMAL LOW (ref 6.5–8.1)

## 2020-11-07 LAB — PROTEIN / CREATININE RATIO, URINE
Creatinine, Urine: 138.45 mg/dL
Protein Creatinine Ratio: 0.11 mg/mg{Cre} (ref 0.00–0.15)
Total Protein, Urine: 15 mg/dL

## 2020-11-07 LAB — GLUCOSE, CAPILLARY
Glucose-Capillary: 117 mg/dL — ABNORMAL HIGH (ref 70–99)
Glucose-Capillary: 106 mg/dL — ABNORMAL HIGH (ref 70–99)
Glucose-Capillary: 113 mg/dL — ABNORMAL HIGH (ref 70–99)
Glucose-Capillary: 92 mg/dL (ref 70–99)

## 2020-11-07 LAB — CBC
HCT: 27.9 % — ABNORMAL LOW (ref 36.0–46.0)
Hemoglobin: 9.4 g/dL — ABNORMAL LOW (ref 12.0–15.0)
MCH: 29 pg (ref 26.0–34.0)
MCHC: 33.7 g/dL (ref 30.0–36.0)
MCV: 86.1 fL (ref 80.0–100.0)
Platelets: 278 10*3/uL (ref 150–400)
RBC: 3.24 MIL/uL — ABNORMAL LOW (ref 3.87–5.11)
RDW: 13.4 % (ref 11.5–15.5)
WBC: 7.9 10*3/uL (ref 4.0–10.5)
nRBC: 0 % (ref 0.0–0.2)

## 2020-11-07 LAB — RAPID URINE DRUG SCREEN, HOSP PERFORMED
Amphetamines: NOT DETECTED
Barbiturates: NOT DETECTED
Benzodiazepines: NOT DETECTED
Cocaine: NOT DETECTED
Opiates: NOT DETECTED
Tetrahydrocannabinol: NOT DETECTED

## 2020-11-07 LAB — TYPE AND SCREEN
ABO/RH(D): O POS
Antibody Screen: NEGATIVE

## 2020-11-07 LAB — RPR: RPR Ser Ql: NONREACTIVE

## 2020-11-07 MED ORDER — DIPHENHYDRAMINE HCL 50 MG/ML IJ SOLN: 12.5000 mg | INTRAMUSCULAR | Status: DC | PRN

## 2020-11-07 MED ORDER — TETANUS-DIPHTH-ACELL PERTUSSIS 5-2.5-18.5 LF-MCG/0.5 IM SUSY: 0.5000 mL | PREFILLED_SYRINGE | Freq: Once | INTRAMUSCULAR | Status: DC

## 2020-11-07 MED ORDER — LACTATED RINGERS IV SOLN: 500.0000 mL | INTRAVENOUS | Status: DC | PRN

## 2020-11-07 MED ORDER — ONDANSETRON HCL 4 MG PO TABS: 4.0000 mg | ORAL_TABLET | ORAL | Status: DC | PRN

## 2020-11-07 MED ORDER — OXYTOCIN-SODIUM CHLORIDE 30-0.9 UT/500ML-% IV SOLN: 2.5000 [IU]/h | INTRAVENOUS | Status: DC

## 2020-11-07 MED ORDER — MISOPROSTOL 50MCG HALF TABLET: 50.0000 ug | ORAL_TABLET | Freq: Once | ORAL | Status: AC

## 2020-11-07 MED ORDER — SIMETHICONE 80 MG PO CHEW: 80.0000 mg | CHEWABLE_TABLET | ORAL | Status: DC | PRN

## 2020-11-07 MED ORDER — OXYTOCIN BOLUS FROM INFUSION
333.0000 mL | Freq: Once | INTRAVENOUS | Status: AC
  Administered 2020-11-07: 333 mL via INTRAVENOUS

## 2020-11-07 MED ORDER — LACTATED RINGERS IV SOLN: 500.0000 mL | Freq: Once | INTRAVENOUS | Status: DC

## 2020-11-07 MED ORDER — MISOPROSTOL 50MCG HALF TABLET
ORAL_TABLET | ORAL | Status: AC
  Administered 2020-11-07: 50 ug via BUCCAL
  Filled 2020-11-07: qty 1

## 2020-11-07 MED ORDER — FERROUS SULFATE 325 (65 FE) MG PO TABS
325.0000 mg | ORAL_TABLET | Freq: Every day | ORAL | Status: DC
  Administered 2020-11-08: 325 mg via ORAL
  Filled 2020-11-07: qty 1

## 2020-11-07 MED ORDER — PHENYLEPHRINE 40 MCG/ML (10ML) SYRINGE FOR IV PUSH (FOR BLOOD PRESSURE SUPPORT)
80.0000 ug | PREFILLED_SYRINGE | INTRAVENOUS | Status: DC | PRN
  Administered 2020-11-07: 80 ug via INTRAVENOUS

## 2020-11-07 MED ORDER — OXYTOCIN-SODIUM CHLORIDE 30-0.9 UT/500ML-% IV SOLN
1.0000 m[IU]/min | INTRAVENOUS | Status: DC
  Filled 2020-11-07: qty 500

## 2020-11-07 MED ORDER — TERBUTALINE SULFATE 1 MG/ML IJ SOLN: 0.2500 mg | Freq: Once | INTRAMUSCULAR | Status: DC | PRN

## 2020-11-07 MED ORDER — LIDOCAINE HCL (PF) 1 % IJ SOLN: 30.0000 mL | INTRAMUSCULAR | Status: DC | PRN

## 2020-11-07 MED ORDER — TRANEXAMIC ACID-NACL 1000-0.7 MG/100ML-% IV SOLN
INTRAVENOUS | Status: AC
  Administered 2020-11-07: 1000 mg
  Filled 2020-11-07: qty 100

## 2020-11-07 MED ORDER — EPHEDRINE 5 MG/ML INJ: 10.0000 mg | INTRAVENOUS | Status: DC | PRN

## 2020-11-07 MED ORDER — FENTANYL CITRATE (PF) 100 MCG/2ML IJ SOLN
50.0000 ug | INTRAMUSCULAR | Status: DC | PRN
  Administered 2020-11-07: 100 ug via INTRAVENOUS
  Filled 2020-11-07: qty 2

## 2020-11-07 MED ORDER — ACETAMINOPHEN 325 MG PO TABS
650.0000 mg | ORAL_TABLET | Freq: Four times a day (QID) | ORAL | Status: DC
  Administered 2020-11-07: 650 mg via ORAL
  Filled 2020-11-07: qty 2

## 2020-11-07 MED ORDER — ONDANSETRON HCL 4 MG/2ML IJ SOLN: 4.0000 mg | INTRAMUSCULAR | Status: DC | PRN

## 2020-11-07 MED ORDER — FENTANYL-BUPIVACAINE-NACL 0.5-0.125-0.9 MG/250ML-% EP SOLN
EPIDURAL | Status: AC
  Filled 2020-11-07: qty 250

## 2020-11-07 MED ORDER — OXYTOCIN-SODIUM CHLORIDE 30-0.9 UT/500ML-% IV SOLN
1.0000 m[IU]/min | INTRAVENOUS | Status: DC
  Administered 2020-11-07: 2 m[IU]/min via INTRAVENOUS

## 2020-11-07 MED ORDER — ACETAMINOPHEN 325 MG PO TABS: 650.0000 mg | ORAL_TABLET | ORAL | Status: DC | PRN

## 2020-11-07 MED ORDER — ACETAMINOPHEN 325 MG PO TABS
650.0000 mg | ORAL_TABLET | ORAL | Status: DC | PRN
  Administered 2020-11-07 – 2020-11-08 (×2): 650 mg via ORAL
  Filled 2020-11-07 (×2): qty 2

## 2020-11-07 MED ORDER — FENTANYL-BUPIVACAINE-NACL 0.5-0.125-0.9 MG/250ML-% EP SOLN: 12.0000 mL/h | EPIDURAL | Status: DC | PRN

## 2020-11-07 MED ORDER — COCONUT OIL OIL: 1.0000 "application " | TOPICAL_OIL | Status: DC | PRN

## 2020-11-07 MED ORDER — MISOPROSTOL 50MCG HALF TABLET: 50.0000 ug | ORAL_TABLET | ORAL | Status: DC

## 2020-11-07 MED ORDER — IBUPROFEN 600 MG PO TABS
600.0000 mg | ORAL_TABLET | Freq: Four times a day (QID) | ORAL | Status: DC
  Administered 2020-11-07 – 2020-11-08 (×5): 600 mg via ORAL
  Filled 2020-11-07 (×5): qty 1

## 2020-11-07 MED ORDER — PRENATAL MULTIVITAMIN CH
1.0000 | ORAL_TABLET | Freq: Every day | ORAL | Status: DC
  Administered 2020-11-08: 1 via ORAL
  Filled 2020-11-07: qty 1

## 2020-11-07 MED ORDER — LIDOCAINE HCL (PF) 1 % IJ SOLN
INTRAMUSCULAR | Status: DC | PRN
  Administered 2020-11-07 (×2): 4 mL via EPIDURAL

## 2020-11-07 MED ORDER — SOD CITRATE-CITRIC ACID 500-334 MG/5ML PO SOLN: 30.0000 mL | ORAL | Status: DC | PRN

## 2020-11-07 MED ORDER — WITCH HAZEL-GLYCERIN EX PADS: 1.0000 "application " | MEDICATED_PAD | CUTANEOUS | Status: DC | PRN

## 2020-11-07 MED ORDER — FENTANYL-BUPIVACAINE-NACL 0.5-0.125-0.9 MG/250ML-% EP SOLN
EPIDURAL | Status: DC | PRN
  Administered 2020-11-07: 12 mL/h via EPIDURAL

## 2020-11-07 MED ORDER — ZOLPIDEM TARTRATE 5 MG PO TABS: 5.0000 mg | ORAL_TABLET | Freq: Every evening | ORAL | Status: DC | PRN

## 2020-11-07 MED ORDER — SENNOSIDES-DOCUSATE SODIUM 8.6-50 MG PO TABS
2.0000 | ORAL_TABLET | Freq: Every day | ORAL | Status: DC
  Administered 2020-11-08: 2 via ORAL
  Filled 2020-11-07: qty 2

## 2020-11-07 MED ORDER — PHENYLEPHRINE 40 MCG/ML (10ML) SYRINGE FOR IV PUSH (FOR BLOOD PRESSURE SUPPORT)
80.0000 ug | PREFILLED_SYRINGE | INTRAVENOUS | Status: DC | PRN
  Filled 2020-11-07: qty 10

## 2020-11-07 MED ORDER — BENZOCAINE-MENTHOL 20-0.5 % EX AERO: 1.0000 "application " | INHALATION_SPRAY | CUTANEOUS | Status: DC | PRN

## 2020-11-07 MED ORDER — DIPHENHYDRAMINE HCL 25 MG PO CAPS: 25.0000 mg | ORAL_CAPSULE | Freq: Four times a day (QID) | ORAL | Status: DC | PRN

## 2020-11-07 MED ORDER — LACTATED RINGERS IV SOLN: INTRAVENOUS | Status: DC

## 2020-11-07 MED ORDER — ONDANSETRON HCL 4 MG/2ML IJ SOLN
4.0000 mg | Freq: Four times a day (QID) | INTRAMUSCULAR | Status: DC | PRN
  Administered 2020-11-07: 4 mg via INTRAVENOUS
  Filled 2020-11-07: qty 2

## 2020-11-07 MED ORDER — DIBUCAINE (PERIANAL) 1 % EX OINT: 1.0000 "application " | TOPICAL_OINTMENT | CUTANEOUS | Status: DC | PRN

## 2020-11-07 NOTE — Anesthesia Preprocedure Evaluation (Signed)
Anesthesia Evaluation  Patient identified by MRN, date of birth, ID band Patient awake    Reviewed: Allergy & Precautions, Patient's Chart, lab work & pertinent test results  History of Anesthesia Complications Negative for: history of anesthetic complications  Airway Mallampati: II  TM Distance: >3 FB Neck ROM: Full    Dental no notable dental hx.    Pulmonary asthma ,    Pulmonary exam normal        Cardiovascular negative cardio ROS Normal cardiovascular exam     Neuro/Psych  Headaches, Anxiety Depression    GI/Hepatic Neg liver ROS, GERD  Medicated and Controlled,cholestasis   Endo/Other  diabetes, Gestational, Oral Hypoglycemic Agents  Renal/GU negative Renal ROS  negative genitourinary   Musculoskeletal  (+) Arthritis ,   Abdominal   Peds  Hematology negative hematology ROS (+)   Anesthesia Other Findings Day of surgery medications reviewed with patient.  Reproductive/Obstetrics negative OB ROS                             Anesthesia Physical Anesthesia Plan  ASA: II  Anesthesia Plan: Epidural   Post-op Pain Management:    Induction:   PONV Risk Score and Plan: Treatment may vary due to age or medical condition  Airway Management Planned: Natural Airway  Additional Equipment:   Intra-op Plan:   Post-operative Plan:   Informed Consent: I have reviewed the patients History and Physical, chart, labs and discussed the procedure including the risks, benefits and alternatives for the proposed anesthesia with the patient or authorized representative who has indicated his/her understanding and acceptance.       Plan Discussed with:   Anesthesia Plan Comments:         Anesthesia Quick Evaluation

## 2020-11-07 NOTE — Discharge Instructions (Signed)

## 2020-11-07 NOTE — Discharge Summary (Addendum)
Postpartum Discharge Summary   Patient Name: [name] DOB: Dec 29, 1980 MRN: [id number]  Date of admission: 11/07/2020 Delivery date:11/07/2020  Delivering provider: Darlina Rumpf  Date of discharge: 11/08/2020  Admitting diagnosis: Cholestasis of pregnancy [O26.619, K83.1] Intrauterine pregnancy: [redacted]w[redacted]d    Secondary diagnosis:  Principal Problem:   Vaginal delivery Active Problems:   PTSD (post-traumatic stress disorder)   Depression with suicidal ideation   Suicide attempt by multiple drug overdose ([place]   Chronic pain following surgery or procedure   Language barrier   AMA (advanced maternal age) multigravida 35+   Cholelithiasis   Maternal obesity affecting pregnancy, antepartum   Gestational diabetes mellitus (GDM) affecting pregnancy   Cholestasis of pregnancy   Cholecystitis, acute  Additional problems:     Discharge diagnosis: Term Pregnancy Delivered                                              Post partum procedures:N/A Augmentation: Pitocin Complications: None  Hospital course: Induction of Labor With Vaginal Delivery   40y.o. yo [id number] admitted to the hospital 11/07/2020 for induction of labor.  Indication for induction: AMA and Cholestasis of pregnancy.  Patient had an uncomplicated labor course as follows: Membrane Rupture Time/Date: 2:46 PM ,11/07/2020   Delivery Method:Vaginal, Spontaneous  Episiotomy: None  Lacerations:  None  Details of delivery can be found in separate delivery note.  Patient had a routine postpartum course. Patient is discharged home 11/08/20. Given h/o cholelithiasis and potentially cholecystitis in pregnancy, ambulatory referral to general surgery sent prior to discharge.  Newborn Data: Birth date:11/07/2020  Birth time:3:36 PM  Gender:Female  Living status:Living  Apgars:8 ,9  Weight:3615 g   Magnesium Sulfate received: No BMZ received: No Rhophylac:N/A MMR:N/A T-DaP:Given prenatally Flu:  Yes Transfusion:No  Physical exam  Vitals:   11/07/20 2207 11/08/20 0224 11/08/20 0558 11/08/20 1432  BP: 126/87 112/78 (!) 128/99 115/76  Pulse: 71 74 81   Resp: '16 18 15 16  ' Temp: 98.3 F (36.8 C) 97.9 F (36.6 C) 98.1 F (36.7 C) 98.1 F (36.7 C)  TempSrc: Oral Oral Oral Oral  SpO2: 98% 100% 100%   Weight:      Height:       General: alert, cooperative and no distress Lochia: appropriate Uterine Fundus: firm Abdomen: appropriately tender on exam Incision: N/A DVT Evaluation: No evidence of DVT seen on physical exam. No cords or calf tenderness. No significant calf/ankle edema. Labs: Lab Results  Component Value Date   WBC 7.9 11/07/2020   HGB 9.4 (L) 11/07/2020   HCT 27.9 (L) 11/07/2020   MCV 86.1 11/07/2020   PLT 278 11/07/2020   CMP Latest Ref Rng & Units 11/07/2020  Glucose 70 - 99 mg/dL 132(H)  BUN 6 - 20 mg/dL 12  Creatinine 0.44 - 1.00 mg/dL 0.66  Sodium 135 - 145 mmol/L 133(L)  Potassium 3.5 - 5.1 mmol/L 3.6  Chloride 98 - 111 mmol/L 105  CO2 22 - 32 mmol/L 18(L)  Calcium 8.9 - 10.3 mg/dL 8.7(L)  Total Protein 6.5 - 8.1 g/dL 6.2(L)  Total Bilirubin 0.3 - 1.2 mg/dL 0.5  Alkaline Phos 38 - 126 U/L 281(H)  AST 15 - 41 U/L 28  ALT 0 - 44 U/L 28   Edinburgh Score: Edinburgh Postnatal Depression Scale Screening Tool 11/08/2020  I have been able to  laugh and see the funny side of things. 0  I have looked forward with enjoyment to things. 0  I have blamed myself unnecessarily when things went wrong. 1  I have been anxious or worried for no good reason. 0  I have felt scared or panicky for no good reason. 0  Things have been getting on top of me. 0  I have been so unhappy that I have had difficulty sleeping. 0  I have felt sad or miserable. 0  I have been so unhappy that I have been crying. 1  The thought of harming myself has occurred to me. 0  Edinburgh Postnatal Depression Scale Total 2     After visit meds:  Allergies as of 11/08/2020       Reactions   Hydrocodone-acetaminophen Itching   Has some itching on face & nose but not bad enough to stop taking it/ MD aware and instructed patient to take anti-allergy medication prn.      Medication List    STOP taking these medications   amoxicillin-clavulanate 875-125 MG tablet Commonly known as: AUGMENTIN   aspirin EC 81 MG tablet   Hyoscyamine Sulfate SL 0.125 MG Subl Commonly known as: Levsin/SL   metFORMIN 500 MG tablet Commonly known as: Glucophage   ondansetron 4 MG disintegrating tablet Commonly known as: ZOFRAN-ODT   pantoprazole 20 MG tablet Commonly known as: PROTONIX     TAKE these medications   acetaminophen 325 MG tablet Commonly known as: Tylenol Take 2 tablets (650 mg total) by mouth every 6 (six) hours as needed for mild pain, moderate pain, fever or headache (for pain scale < 4).   coconut oil Oil Apply 1 application topically as needed (nipple pain).   ferrous sulfate 325 (65 FE) MG tablet Take 325 mg by mouth daily with breakfast. What changed: Another medication with the same name was changed. Make sure you understand how and when to take each.   ferrous sulfate 325 (65 FE) MG tablet Take 1 tablet (325 mg total) by mouth every other day. What changed: when to take this   ibuprofen 600 MG tablet Commonly known as: ADVIL Take 1 tablet (600 mg total) by mouth every 8 (eight) hours as needed for moderate pain or cramping.   oxyCODONE 5 MG immediate release tablet Commonly known as: Oxy IR/ROXICODONE Take 1 tablet (5 mg total) by mouth every 6 (six) hours as needed for breakthrough pain or severe pain.   polyethylene glycol 17 g packet Commonly known as: MIRALAX / GLYCOLAX Take 17 g by mouth daily.   prenatal multivitamin Tabs tablet Take 1 tablet by mouth daily at 12 noon.       Discharge home in stable condition Infant Feeding: Breast Infant Disposition:home with mother Discharge instruction: per After Visit Summary and Postpartum  booklet. Activity: Advance as tolerated. Pelvic rest for 6 weeks.  Diet: routine diet Future Appointments: Future Appointments  Date Time Provider [place]  11/16/2020 10:00 AM Olean Ree, MD [facility] None  11/28/2020  9:15 AM [facility]  12/05/2020  4:09 PM Arrie Senate, MD [facility]   Follow up Visit: Message sent 11/07/2020  Please schedule this patient for a In person postpartum visit in 4 weeks with the following provider: Any provider. Additional Postpartum F/U:1 week mood check  High risk pregnancy complicated by: ICP Delivery mode:  Vaginal, Spontaneous  Anticipated Birth Control:  Depo - received at discharge  Rugby, Lauralyn Primes, DO OB Fellow, Faculty  Practice 11/08/2020 3:30 PM

## 2020-11-07 NOTE — Progress Notes (Signed)
[name] is a 40 y.o. [id number] at [redacted]w[redacted]d admitted for IOL for cholestasis.  Also with A2GDM, mild polyhydramnios, unstable lie.  Subjective: Feeling very uncomfortable contractions.  No concerns.    Objective: BP 121/73   Pulse 86   Temp 97.9 F (36.6 C) (Oral)   Resp 20   Ht 5\' 2"  (1.575 m)   Wt 106.7 kg   LMP 02/22/2020 (Exact Date)   BMI 43.04 kg/m  No intake/output data recorded.  FHT: FHR: 130 bpm, variability: moderate, accelerations: Present, decelerations: Absent UC: q2-3 min  SVE:   Dilation: 5 Effacement (%): 70 Station: -3 Exam by:: Dr. [email or web address]  Pitocin @ 8 mu/min  Labs: Lab Results  Component Value Date   WBC 7.9 11/07/2020   HGB 9.4 (L) 11/07/2020   HCT 27.9 (L) 11/07/2020   MCV 86.1 11/07/2020   PLT 278 11/07/2020    Assessment / Plan: [name] is a 40 y.o. 24 at [redacted]w[redacted]d admitted for IOL for cholestasis.  Also with A2GDM, mild polyhydramnios, unstable lie.  #Induction of labor: Received Cytotec for cervical ripening on admission.  Subsequently started on pitocin.  Progressing normally.  Continue to titrate pit and AROM when able.   #Pain: Epidural upon patient request #FWB: Category I #ID: GBS negative #MOF: Breast #MOC: Depo #Circ: Declines #A2GDM: Plan for q4hr BG checks in latent labor and q2hr BG checks in active labor #Depression  PTSD  H/o multiple suicide attempts: Plan for SW in postpartum period and 1 week mood check in clinic #Unstable fetal lie: Cephalic presentation reconfirmed on ultrasound on admission. #Grand multiparity: Plan for TXA at time of delivery given risk factors for [facility] #Cholestasis of pregnancy: BA 14.1 on 11/03/20.  IOL as noted above. #Anticipated LGA Infant: Discussed shoulder dystocia precautions on admission.  11/05/20, MD 11/07/2020, 11:55 AM

## 2020-11-07 NOTE — Progress Notes (Signed)
Patient declines interpreter at this time. She said she will ask for one if she needs one. Royston Cowper, RN

## 2020-11-07 NOTE — H&P (Signed)
OBSTETRIC ADMISSION HISTORY AND PHYSICAL  [name] is a 40 y.o. female [phone] with IUP at [redacted]w[redacted]d by L/6 presenting for IOL secondary to cholestasis of pregnancy (bile acids 14.1 on 11/03/20). She reports +FMs, No LOF, no VB, no blurry vision, headaches or peripheral edema, and RUQ pain.  She plans on breast feeding. She requests depo for birth control. She received her prenatal care at [facility]   Dating: By L/6 --->  Estimated Date of Delivery: 11/28/20  Sono: @[redacted]w[redacted]d , CWD, normal anatomy, breech presentation, 2964g, 97% EFW, AFI 27.5  Prenatal History/Complications:  - cholestasis of pregnancy (BA 14.1 on 11/03/20) - Depression with passive SI (h/o multiple suicide attempts with drug overdose) - Unstable fetal lie - Mild polyhydramnios (AFI 27.5 at [redacted]w[redacted]d) - A2GDM (MTF in pregnancy) - PTSD - AMA - Grand multiparity  Past Medical History: Past Medical History:  Diagnosis Date  . Anxiety    relative to circumstances to back problems/injury, not taking any medicine, pt. reports has been crying a lot  . Arthritis    low back  . Asthma   . Back pain   . Cholelithiasis 04/08/2020  . Depression   . GERD (gastroesophageal reflux disease)   . Migraines   . Neck pain     Past Surgical History: Past Surgical History:  Procedure Laterality Date  . childbirth     x4 vaginal   . LUMBAR LAMINECTOMY/DECOMPRESSION MICRODISCECTOMY  06/05/2012   Procedure: LUMBAR LAMINECTOMY/DECOMPRESSION MICRODISCECTOMY;  Surgeon: 06/07/2012, MD;  Location: [facility] OR;  Service: Orthopedics;  Laterality: Left;  Left sided lumbar 4-5 microdisectomy    Obstetrical History: OB History    Gravida  9   Para  5   Term  5   Preterm  0   AB  3   Living  5     SAB  0   IAB  2   Ectopic  1   Multiple  0   Live Births  5           Social History Social History   Socioeconomic History  . Marital status: Single    Spouse name: Not on file  . Number of children: Not on file  . Years  of education: Not on file  . Highest education level: Not on file  Occupational History  . Not on file  Tobacco Use  . Smoking status: Never Smoker  . Smokeless tobacco: Never Used  Vaping Use  . Vaping Use: Never used  Substance and Sexual Activity  . Alcohol use: No  . Drug use: No  . Sexual activity: Not Currently    Birth control/protection: None  Other Topics Concern  . Not on file  Social History Narrative  . Not on file   Social Determinants of Health   Financial Resource Strain: Not on file  Food Insecurity: Food Insecurity Present  . Worried About [facility] in the Last Year: Sometimes true  . Ran Out of Food in the Last Year: Sometimes true  Transportation Needs: Unmet Transportation Needs  . Lack of Transportation (Medical): Yes  . Lack of Transportation (Non-Medical): Yes  Physical Activity: Not on file  Stress: Not on file  Social Connections: Not on file    Family History: Family History  Problem Relation Age of Onset  . Asthma Brother     Allergies: Allergies  Allergen Reactions  . Hydrocodone-Acetaminophen Itching    Has some itching on face & nose but not bad enough to  stop taking it/ MD aware and instructed patient to take anti-allergy medication prn.    Medications Prior to Admission  Medication Sig Dispense Refill Last Dose  . albuterol (ACCUNEB) 0.63 MG/3ML nebulizer solution Take 1 ampule by nebulization every 6 (six) hours as needed for wheezing.     Marland Kitchen amoxicillin-clavulanate (AUGMENTIN) 875-125 MG tablet Take 1 tablet by mouth 2 (two) times daily for 3 days. 6 tablet 0   . aspirin EC 81 MG tablet Take 1 tablet (81 mg total) by mouth daily. Take after 12 weeks for prevention of preeclampsia later in pregnancy 300 tablet 2   . ferrous sulfate 325 (65 FE) MG tablet Take 325 mg by mouth daily with breakfast.     . Hyoscyamine Sulfate SL (LEVSIN/SL) 0.125 MG SUBL Place 0.125 mg under the tongue every 12 (twelve) hours as needed  (abdominal gas pain). 30 tablet 0   . metFORMIN (GLUCOPHAGE) 500 MG tablet Take 1 tablet (500 mg total) by mouth 2 (two) times daily with a meal. 60 tablet 1   . ondansetron (ZOFRAN-ODT) 4 MG disintegrating tablet Take 1 tablet (4 mg total) by mouth every 6 (six) hours as needed for nausea or vomiting. 20 tablet 0   . pantoprazole (PROTONIX) 20 MG tablet Take 1 tablet (20 mg total) by mouth 2 (two) times daily. 60 tablet 1   . Prenatal Vit-Fe Fumarate-FA (PRENATAL MULTIVITAMIN) TABS tablet Take 1 tablet by mouth daily at 12 noon.        Review of Systems   All systems reviewed and negative except as stated in HPI  Blood pressure 104/73, pulse 95, temperature 97.9 F (36.6 C), temperature source Oral, resp. rate 20, height 5\' 2"  (1.575 m), weight 106.7 kg, last menstrual period 02/22/2020, currently breastfeeding. General appearance: alert, cooperative and appears stated age Lungs: normal WOB Heart: regular rate Abdomen: soft, non-tender Extremities: no sign of DVT Presentation: cephalic on bedside ultrasound Fetal monitoringBaseline: 130 bpm, Variability: Good {> 6 bpm), Accelerations: Reactive and Decelerations: Absent Uterine activityFrequency: rare contractions Dilation: Fingertip Effacement (%): Thick Station: -3 Exam by:: Dr. [email or web address]  Prenatal labs: ABO, Rh: O/Positive/-- (11/03 0000) Antibody:   Rubella: Immune (11/03 0000) RPR: Nonreactive (11/03 0000)  HBsAg: Negative (11/03 0000)  HIV: Non-reactive (11/03 0000)  GBS: Negative/-- (02/16 1125)  1 hr GTT 184 Genetic screening  wnl Anatomy [birth year]1989 wnl  Prenatal Transfer Tool  Maternal Diabetes: Yes:  Diabetes Type:  Insulin/Medication controlled (metformin) Genetic Screening: wnl Maternal Ultrasounds/Referrals: Normal except anticipated LGA (EFW 97% at [redacted]w[redacted]d) Fetal Ultrasounds or other Referrals: Referral to MFM Maternal Substance Abuse:  No Significant Maternal Medications:  Meds include: Other:  metformin Significant  Maternal Lab Results: Group B Strep negative  Results for orders placed or performed during the hospital encounter of 11/07/20 (from the past 24 hour(s))  CBC   Collection Time: 11/07/20 12:27 AM  Result Value Ref Range   WBC 7.9 4.0 - 10.5 K/uL   RBC 3.24 (L) 3.87 - 5.11 MIL/uL   Hemoglobin 9.4 (L) 12.0 - 15.0 g/dL   HCT 11/09/20 (L) 73.7 - 10.6 %   MCV 86.1 80.0 - 100.0 fL   MCH 29.0 26.0 - 34.0 pg   MCHC 33.7 30.0 - 36.0 g/dL   RDW 26.9 48.5 - 46.2 %   Platelets 278 150 - 400 K/uL   nRBC 0.0 0.0 - 0.2 %  Glucose, capillary   Collection Time: 11/07/20  1:04 AM  Result Value Ref Range   Glucose-Capillary 117 (  H) 70 - 99 mg/dL   Comment 1 Notify RN    Comment 2 Call MD NNP PA CNM    Comment 3 Document in Chart     Patient Active Problem List   Diagnosis Date Noted  . Unstable lie of fetus 11/03/2020  . Cholecystitis, acute 11/03/2020  . Cholestasis of pregnancy 10/14/2020  . Supervision of high risk pregnancy, antepartum 09/27/2020  . Maternal obesity affecting pregnancy, antepartum 09/27/2020  . Gestational diabetes mellitus (GDM) affecting pregnancy 09/27/2020  . Cholelithiasis 04/08/2020  . AMA (advanced maternal age) multigravida 35+ 12/26/2018  . Language barrier 11/21/2018  . ASCUS of cervix with negative high risk HPV 10/20/2018  . PTSD (post-traumatic stress disorder) 05/29/2013  . Depression with suicidal ideation 05/29/2013  . Suicide attempt by multiple drug overdose (HCC) 05/29/2013  . Chronic pain following surgery or procedure 05/29/2013  . HNP (herniated nucleus pulposus), lumbar 02/04/2013    Assessment/Plan:  Cambelle Suchecki is a 40 y.o. [id number] at [redacted]w[redacted]d here for IOL for cholestasis of pregnancy.  #Induction of Labor: Given initial cervical exam, will start with cytotec and attempt FB placement at next check if still unfavorable cervical exam. #Pain: TBD per pt request #FWB: Category 1 strip #ID: GBS negative #MOF: breast #MOC: depo #Circ:  declines #A2GDM: plan for q4hr BG checks in latent labor and q2hr BG checks in active labor #Depression  PTSD  H/o multiple suicide attempts: plan for SW in postpartum period and 1 week mood check in clinic #Unstable fetal lie: ultrasound to confirm cephalic presentation on admission #Grand multiparity: plan for TXA at time of delivery given risk factors for [facility] #Cholestasis of pregnancy: BA 14.1 on 11/03/20. IOL as noted above. #Anticipated LGA Infant: discussed shoulder dystocia precautions on admission.  Sheila Oats, MD OB Fellow, Faculty Practice 11/07/2020 1:33 AM

## 2020-11-07 NOTE — Lactation Note (Addendum)
This note was copied from a baby's chart. Lactation Consultation Note  Patient Name: [name] [id number] Date: 11/07/2020 Reason for consult: L&D Initial assessment;Early term 37-38.6wks   (L&D-LC no charge) Mom with GDM Age:40 hours, ETI female infant LC congratulated mom on the birth of "[place]". LC entered the room, mom had infant latched on her left breast using the cross cradle hold position, infant was latched with depth and swallows observed. Afterwards mom did hand expression and infant was given 9 mls of EBM by spoon. Mom will BF infant according primal cues: licking, kissing, tasting, rooting and hands in mouth. LC will set up DEBP in mom's room on MBU and RN will explain to mom how to use pump and mom plans to pump every 3 hours for 15 minutes on initial setting and give infant back any EBM after latching infant at the breast.   LC discussed with mom, RN and LC on MBU can help assist with latching infant at the breast if needed. Maternal Data Does the patient have breastfeeding experience prior to this delivery?: Yes How long did the patient breastfeed?: Per mom, she BF 2nd & 3rd child for 14 months each, 4th child for only 7 months due being on a medication.  Feeding Mother's Current Feeding Choice: Breast Milk  LATCH Score Latch: Grasps breast easily, tongue down, lips flanged, rhythmical sucking.  Audible Swallowing: Spontaneous and intermittent  Type of Nipple: Everted at rest and after stimulation  Comfort (Breast/Nipple): Soft / non-tender  Hold (Positioning): No assistance needed to correctly position infant at breast.  LATCH Score: 10   Lactation Tools Discussed/Used    Interventions Interventions: Breast feeding basics reviewed;Skin to skin;Expressed milk;DEBP;Education  Discharge Pump: DEBP  Consult Status Consult Status: Follow-up Date: 11/08/20 Follow-up type: In-patient    Danelle Earthly 11/07/2020, 4:49 PM

## 2020-11-07 NOTE — Anesthesia Procedure Notes (Signed)
Epidural Patient location during procedure: OB Start time: 11/07/2020 11:57 AM End time: 11/07/2020 12:00 PM  Staffing Anesthesiologist: Kaylyn Layer, MD Performed: anesthesiologist   Preanesthetic Checklist Completed: patient identified, IV checked, risks and benefits discussed, monitors and equipment checked, pre-op evaluation and timeout performed  Epidural Patient position: sitting Prep: DuraPrep and site prepped and draped Patient monitoring: continuous pulse ox, blood pressure and heart rate Approach: midline Location: L3-L4 Injection technique: LOR air  Needle:  Needle type: Tuohy  Needle gauge: 17 G Needle length: 9 cm Needle insertion depth: 6 cm Catheter type: closed end flexible Catheter size: 19 Gauge Catheter at skin depth: 11 cm Test dose: negative and Other (1% lidocaine)  Assessment Events: blood not aspirated, injection not painful, no injection resistance, no paresthesia and negative IV test  Additional Notes Patient identified. Risks, benefits, and alternatives discussed with patient including but not limited to bleeding, infection, nerve damage, paralysis, failed block, incomplete pain control, headache, blood pressure changes, nausea, vomiting, reactions to medication, itching, and postpartum back pain. Confirmed with bedside nurse the patient's most recent platelet count. Confirmed with patient that they are not currently taking any anticoagulation, have any bleeding history, or any family history of bleeding disorders. Patient expressed understanding and wished to proceed. All questions were answered. Sterile technique was used throughout the entire procedure. Please see nursing notes for vital signs.   Crisp LOR on first pass. Test dose was given through epidural catheter and negative prior to continuing to dose epidural or start infusion. Warning signs of high block given to the patient including shortness of breath, tingling/numbness in hands, complete  motor block, or any concerning symptoms with instructions to call for help. Patient was given instructions on fall risk and not to get out of bed. All questions and concerns addressed with instructions to call with any issues or inadequate analgesia.  Reason for block:procedure for pain

## 2020-11-08 DIAGNOSIS — F4310 Post-traumatic stress disorder, unspecified: Secondary | ICD-10-CM

## 2020-11-08 DIAGNOSIS — E669 Obesity, unspecified: Secondary | ICD-10-CM

## 2020-11-08 DIAGNOSIS — F32A Depression, unspecified: Secondary | ICD-10-CM

## 2020-11-08 DIAGNOSIS — Z603 Acculturation difficulty: Secondary | ICD-10-CM

## 2020-11-08 DIAGNOSIS — O99215 Obesity complicating the puerperium: Secondary | ICD-10-CM

## 2020-11-08 DIAGNOSIS — O2663 Liver and biliary tract disorders in the puerperium: Secondary | ICD-10-CM

## 2020-11-08 DIAGNOSIS — O99345 Other mental disorders complicating the puerperium: Secondary | ICD-10-CM

## 2020-11-08 DIAGNOSIS — O99893 Other specified diseases and conditions complicating puerperium: Secondary | ICD-10-CM

## 2020-11-08 DIAGNOSIS — R45851 Suicidal ideations: Secondary | ICD-10-CM

## 2020-11-08 LAB — GLUCOSE, CAPILLARY: Glucose-Capillary: 92 mg/dL (ref 70–99)

## 2020-11-08 MED ORDER — COCONUT OIL OIL: 1.0000 "application " | TOPICAL_OIL | 0 refills | Status: DC | PRN

## 2020-11-08 MED ORDER — FERROUS SULFATE 325 (65 FE) MG PO TABS: 325.0000 mg | ORAL_TABLET | ORAL | 1 refills | Status: DC

## 2020-11-08 MED ORDER — POLYETHYLENE GLYCOL 3350 17 G PO PACK
17.0000 g | PACK | Freq: Every day | ORAL | Status: DC
  Administered 2020-11-08 (×2): 17 g via ORAL
  Filled 2020-11-08 (×2): qty 1

## 2020-11-08 MED ORDER — POLYETHYLENE GLYCOL 3350 17 G PO PACK: 17.0000 g | PACK | Freq: Every day | ORAL | 0 refills | Status: DC

## 2020-11-08 MED ORDER — ACETAMINOPHEN 325 MG PO TABS: 650.0000 mg | ORAL_TABLET | Freq: Four times a day (QID) | ORAL | Status: DC | PRN

## 2020-11-08 MED ORDER — OXYCODONE HCL 5 MG PO TABS
5.0000 mg | ORAL_TABLET | Freq: Four times a day (QID) | ORAL | Status: DC | PRN
  Administered 2020-11-08 (×2): 5 mg via ORAL
  Filled 2020-11-08 (×2): qty 1

## 2020-11-08 MED ORDER — OXYCODONE HCL 5 MG PO TABS: 5.0000 mg | ORAL_TABLET | Freq: Four times a day (QID) | ORAL | 0 refills | Status: DC | PRN

## 2020-11-08 MED ORDER — IBUPROFEN 600 MG PO TABS: 600.0000 mg | ORAL_TABLET | Freq: Three times a day (TID) | ORAL | 0 refills | Status: DC | PRN

## 2020-11-08 MED ORDER — MEDROXYPROGESTERONE ACETATE 150 MG/ML IM SUSP
150.0000 mg | Freq: Once | INTRAMUSCULAR | Status: AC
  Administered 2020-11-08: 150 mg via INTRAMUSCULAR
  Filled 2020-11-08: qty 1

## 2020-11-08 NOTE — Social Work (Signed)
CSW received consult for depression and h/o multiple suicide attempts. CSW met with MOB to offer support and complete assessment.     CSW used [place] interpreter Laura [phone]. CSW introduced self and role. CSW observed MOB holding sleeping infant. CSW introduced self and role. MOB was calm and engaged during assessment. CSW informed MOB of reason for consult and assessed current emotions. MOB reported she is currently doing well. MOB stated her pregnancy was complicated due to not being able to work for money and getting sick with her gallbladder. CSW discussed supports with MOB. MOB reported her daughter is a good support and helps. CSW discussed MOB mental health history. MOB declined having any mental health diagnosis but disclosed past feelings of depression and SI. MOB denies current SI. MOB reported she prays to God to take negative thoughts away when she starts having depressive thoughts. CSW asked MOB if she feels comfortable seeking professional help if challenges arise postpartum, MOB stated yes. MOB denies current HI or being involved in DV.   CSW provided education regarding the baby blues period versus PPD. MOB was provided the New Mom Checklist and encouraged to self-evaluate postpartum.  CSW provided review of Sudden Infant Death Syndrome (SIDS) precautions.  MOB expressed understanding and reported she has all essential needs for infant. MOB identified [facility] for follow-up care and stated she has transportation barriers. MOB provided MOB information on [facility] [employer] and informed her a ride could be scheduled. MOB was in agreement to have transport set-up prior to discharge. MOB also provided permission for referrals to [facility]. MOB expressed interest in Medicaid, CSW contacted Financial Navigator to complete referral. MOB expressed no additional needs at this time.  CSW identifies no further need for intervention and no barriers to discharge at this  time.  Chaney Johnson, LCSWA Clinical Social Work [facility] [phone]  

## 2020-11-08 NOTE — Lactation Note (Addendum)
This note was copied from a baby's chart. Lactation Consultation Note  Patient Name: [name] [id number] Date: 11/08/2020 Reason for consult: Follow-up assessment;Early term 37-38.6wks Age:40 hours  Consult was done in Spanish:  Follow up visit to 26 hours old with 3.18% weight loss at the time of consult. Baby is sleeping  upon arrival. Mother states breastfeeding is going well. Mother denies any pain or discomfort with latch. Mother explains infant has been feeding more frequently in the last few hours. Talked about clusterfeeding and normal newborn behavior.  Infant has been been having good voids and stools, per mother. Infant shows hunger cues. LC assisted with undressing and changed void. Mother latched easily to left breast cradle position. Noted deep latch, sucking and swallowing and mother massages breast.   Feeding plan:  1. Breastfeed following hunger cues.  2. Stimulate infant awake at the breast 3. Offer breast 8 -  12 times in 24h period to establish good milk supply.   4. Monitor voids and stools as signs of good milk transferring.  5. Encouraged maternal rest, hydration and food intake.  6. Contact Lactation Services or local resources for support, questions or concerns.    All questions answered at this time. Family is waiting to be discharged home today.   Feeding Mother's Current Feeding Choice: Breast Milk  LATCH Score Latch: Grasps breast easily, tongue down, lips flanged, rhythmical sucking.  Audible Swallowing: Spontaneous and intermittent  Type of Nipple: Everted at rest and after stimulation  Comfort (Breast/Nipple): Soft / non-tender  Hold (Positioning): No assistance needed to correctly position infant at breast.  LATCH Score: 10  Interventions Interventions: Support pillows;Skin to skin;Hand pump;DEBP;Education  Discharge Discharge Education: Engorgement and breast care;Warning signs for feeding baby Pump: DEBP;Manual WIC Program:  Yes  Consult Status Consult Status: Complete Date: 11/08/20 Follow-up type: Call as needed    Davidmichael Zarazua A Higuera Ancidey 11/08/2020, 6:15 PM

## 2020-11-10 NOTE — Anesthesia Postprocedure Evaluation (Signed)
Anesthesia Post Note  Patient: [name]  Procedure(s) Performed: AN AD HOC LABOR EPIDURAL     Patient location during evaluation: Mother Baby Anesthesia Type: Epidural Level of consciousness: awake and alert Pain management: pain level controlled Vital Signs Assessment: post-procedure vital signs reviewed and stable Respiratory status: spontaneous breathing, nonlabored ventilation and respiratory function stable Cardiovascular status: stable Postop Assessment: no headache, no backache and epidural receding Anesthetic complications: no   No complications documented.  Last Vitals:  Vitals:   11/08/20 0558 11/08/20 1432  BP: (!) 128/99 115/76  Pulse: 81   Resp: 15 16  Temp: 36.7 C 36.7 C  SpO2: 100%     Last Pain:  Vitals:   11/08/20 1751  TempSrc:   PainSc: 6    Pain Goal:                   Cleda Clarks

## 2020-11-16 ENCOUNTER — Ambulatory Visit: Payer: Self-pay | Admitting: Surgery

## 2020-11-21 NOTE — BH Specialist Note (Addendum)
Integrated Behavioral Health Initial In-Person Visit  MRN: [id number] Name: [name]  Number of Integrated Behavioral Health Clinician visits:: 1/6 Session Start time: 9:32  Session End time: 9:47 Total time: 15 minutes  Types of Service: Individual psychotherapy  Interpretor:Yes.   Interpretor Name and Language: Archie Patten   Warm Hand Off Completed.       Subjective: [name] is a 40 y.o. female accompanied by newborn Patient was referred by Catalina Antigua, MD for PTSD and depression . Patient reports the following symptoms/concerns: Pt states she last had passive SI "over a week ago" without a plan or intent, uses prayer and knowledge that children need her, to cope with changing emotions. Pt is open to referral to psychiatry, follow-up appointment with [facility] intern, and to use food market today. Pt agrees she will use [facility] 24/7 if SI thoughts return, even with no insurance.  Duration of problem: Ongoing; Severity of problem: moderately severe  Objective: Mood: Normal and Affect: Appropriate Risk of harm to self or others: Suicidal ideation No plan to harm self or others  Life Context: Family and Social: Pt has six children School/Work: - Self-Care: - Life Changes: Recent childbirth  Patient and/or Family's Strengths/Protective Factors: Sense of purpose  Goals Addressed: Patient will: 1. Reduce symptoms of: anxiety, depression and stress 2. Increase knowledge and/or ability of: stress reduction  3. Demonstrate ability to: Increase healthy adjustment to current life circumstances, Increase adequate support systems for patient/family and Increase motivation to adhere to plan of care  Progress towards Goals: Ongoing  Interventions: Interventions utilized: Safety  Standardized Assessments completed: GAD-7 and PHQ 9  Patient and/or Family Response: Pt agrees with treatment plan  Patient Centered  Plan: Patient is on the following Treatment Plan(s):  IBH  Assessment: Patient currently experiencing Post-traumatic stress disorder and Major depressive disorder, recurrent, moderate, and Psychosocial stress   Patient may benefit from psychoeducation and brief therapeutic interventions regarding coping with symptoms of mood instability .  Plan: 1. Follow up with behavioral health clinician on : One week with [facility] Intern 2. Behavioral recommendations:  -Accept referral to psychiatry and ongoing therapy -Follow Safety Plan, as discussed -Continue taking iron pill daily as prescribed by medical provider -Take home food bag today -Consider registering for online new mom support group at [email or web address] [facility]) for additional support  3. Referral(s): Integrated Art gallery manager (In Clinic), Community Mental Health Services (LME/Outside Clinic) and Community Resources:  Food  Rae Lips, [place]  Depression screen [facility] 2/9 11/28/2020 11/04/2020 10/26/2020 09/27/2020 11/21/2018  Decreased Interest 0 2 1 0 2  Down, Depressed, Hopeless 0 2 2 0 0  PHQ - 2 Score 0 4 3 0 2  Altered sleeping 0 2 1 0 2  Tired, decreased energy 0 2 1 0 2  Change in appetite 2 1 1  0 2  Feeling bad or failure about yourself  3 3 2  0 2  Trouble concentrating 0 0 0 0 0  Moving slowly or fidgety/restless 0 0 0 0 0  Suicidal thoughts 1 2 0 0 0  PHQ-9 Score 6 14 8  0 10   GAD 7 : Generalized Anxiety Score 11/28/2020 11/04/2020 10/26/2020 09/27/2020  Nervous, Anxious, on Edge 1 1 1  0  Control/stop worrying 0 0 0 0  Worry too much - different things 2 3 1  0  Trouble relaxing 1 1 0 0  Restless 0 0 0 0  Easily annoyed or irritable 2 3 2  0  Afraid - awful might happen 0 3 1 0  Total GAD 7 Score 6 11 5  0

## 2020-11-28 ENCOUNTER — Ambulatory Visit: Payer: Self-pay | Admitting: Clinical

## 2020-11-28 ENCOUNTER — Encounter: Payer: Self-pay | Admitting: *Deleted

## 2020-11-28 DIAGNOSIS — F4310 Post-traumatic stress disorder, unspecified: Secondary | ICD-10-CM

## 2020-11-28 DIAGNOSIS — Z658 Other specified problems related to psychosocial circumstances: Secondary | ICD-10-CM

## 2020-11-28 DIAGNOSIS — F331 Major depressive disorder, recurrent, moderate: Secondary | ICD-10-CM

## 2020-11-28 NOTE — Addendum Note (Signed)
Addended by: Hulda Marin C on: 11/28/2020 10:37 AM   Modules accepted: Orders

## 2020-12-05 ENCOUNTER — Encounter: Payer: Self-pay | Admitting: Family Medicine

## 2020-12-05 ENCOUNTER — Ambulatory Visit (INDEPENDENT_AMBULATORY_CARE_PROVIDER_SITE_OTHER): Payer: Self-pay | Admitting: Family Medicine

## 2020-12-05 VITALS — BP 121/84 | HR 77 | Ht 62.0 in | Wt 210.5 lb

## 2020-12-05 DIAGNOSIS — R45851 Suicidal ideations: Secondary | ICD-10-CM

## 2020-12-05 DIAGNOSIS — F32A Depression, unspecified: Secondary | ICD-10-CM

## 2020-12-05 DIAGNOSIS — T50912S Poisoning by multiple unspecified drugs, medicaments and biological substances, intentional self-harm, sequela: Secondary | ICD-10-CM

## 2020-12-05 DIAGNOSIS — O0990 Supervision of high risk pregnancy, unspecified, unspecified trimester: Secondary | ICD-10-CM

## 2020-12-05 DIAGNOSIS — O24419 Gestational diabetes mellitus in pregnancy, unspecified control: Secondary | ICD-10-CM

## 2020-12-05 DIAGNOSIS — Z789 Other specified health status: Secondary | ICD-10-CM

## 2020-12-05 DIAGNOSIS — F4310 Post-traumatic stress disorder, unspecified: Secondary | ICD-10-CM

## 2020-12-05 NOTE — Patient Instructions (Signed)
AREA FAMILY PRACTICE PHYSICIANS  [place] ([address]) . [facility] o [address] [place], [place] [address] o [phone] o Mon-Fri 8:30-12:30, 1:30-5:00 o Accepting Medicaid . [facility] o [address], [place], [place] [address] o [phone] o Mon-Fri 8:00-5:30 . [facility] o [address] [place], [place] [address] o [phone] o Mon, Tue, Thur, Fri 8:30-5:00, Wed 10:00-7:00 (closed 1-2pm) o Accepting Medicaid . [facility] o [address], [place], [place]  [address] o Phone - [phone]   Fax - [phone]  East/[place]ortheast [place] ([address]) . [facility] o [address] [place], [place] [address] o [phone] o Mon-Fri 8:00-5:00 . [facility]  o [address] [place], [place] [address] o [phone] o Mon-Fri 8:30-5:30, Sat (Oct.-Mar.) 9:00-1:00 o Accepting Medicaid  [place] ([address]) . [facility] o [address], [place], [place] [address] o [phone] o Mon-Fri 8:00-5:00  [place] ([address]) . [facility] o [address], [place], [place] [address] o [phone] o Mon-Fri 8:00-5:00 . [facility] o [address], [place], [place] [address] o [phone] o Mon-Fri 8:00-5:00 . [facility] o [address] [place], [place] [address] o [phone] o Mon-Fri 8:00-5:00 . [facility] o [address] [place] [address] o [phone] o Mon-Fri 7:30-5:30  [place] ([address] & [address]) . [facility] o [address] [place], [place] [address] o [phone] o Mon-Thur 8:00-6:00 o Accepting Medicaid . [facility] o [address] [place], [place] [address] o [phone] o Mon-Thur 7:30-7:30, Fri 7:30-4:30 o Accepting Medicaid . [facility] o [address], [place], [place]  [address] o [phone]   Fax - [phone]  [place] ([address] & [address]) . [facility] o [address] [place], [place] [address] o [phone] o Mon-Fri 7:00-5:00 . [facility] o [address], [place], [place] [address] o [phone] o Mon-Fri 8:00-5:00 o Accepting Medicaid . [facility] o [address], [place], [place] [address] o [phone] o Mon-Fri 8:00-5:00 o Accepting Medicaid  [place] ([address]) .  [facility] [place] o [address] [place], [place] [address] o [phone] o Mon-Fri 8:00-5:00 . [facility] o [address], [place], [place] [address] o [phone] o Mon-Fri 8:00-5:00 o Accepting Medicaid . [facility] o [address], [place], [place] [address] o [phone] o Mon-Fri 8:00-5:30, Sat&Sun by appointment (phones open at 8:30) o Accepting Medicaid  [place] ([address]) . [facility] o [address] [place], [place] [address] o [phone] o Mon-Thur 8:00-7:00, Fri 8:00-5:00, Sat 8:00-12:00, Sun 9:00-12:00 o Accepting Medicaid . [facility] [place] o [address], [place], [place] [address] o [phone] o Mon-Thur 8:00-5:00 o Accepting Medicaid . [facility] o [address] [place], [place] [address] o [phone] o Mon-Fri 8:00-5:30, Sat (Oct.-Mar.) 9:00-1:00 o Accepting Medicaid  [place]  ([address]) .   [facility] o [address], [place], [place] [address] o [phone] o Mon-Fri 8:00-5:00 o Accepting Medicaid   [place] ([address]) . [facility] [place] o [address], [place], [place] [address] o [phone] o Mon-Fri 8:00-5:00 . [facility] [place] o [address], [place], [place] [address] o [phone] o Mon-Fri 8:00-5:00 . [facility] [place] o [address] [place] [address], [place], [place] [address] o [phone] o Mon-Fri 8:00-5:00 o After hours clinic ([address] [place], [place] [address]) [phone] Mon-Fri 5:00-8:00, Sat 12:00-6:00, Sun 10:00-4:00 o Accepting Medicaid . [facility] [place] o [address], [place], [place]  [address] o [phone]   Fax - [phone]  [place] ([address]) . [facility] [place] o [address], [place], [place] [address] o [phone] o Mon-Fri 8:00-5:00 . [facility] [place] ([facility] [place] o [address], [place], [place] [address] o [phone] o Mon-Thur 8:00-7:00, Fri 8:00-5:00, Sat 8:00-12:00    

## 2020-12-05 NOTE — Progress Notes (Signed)
    Post Partum Visit Note  [name] is a 40 y.o. [phone] female who presents for a postpartum visit. She is 4 weeks postpartum following a normal spontaneous vaginal delivery.  I have fully reviewed the prenatal and intrapartum course. The delivery was at [redacted]w[redacted]d gestational weeks.  Anesthesia: epidural. Postpartum course has been uncomplicated. Baby is doing well. Baby is feeding by breast. Bleeding staining only. Bowel function is normal. Bladder function is normal. Patient is not sexually active. Contraception method is Depo-Provera injections. Postpartum depression screening: negative.   The pregnancy intention screening data noted above was reviewed. Potential methods of contraception were discussed. The patient elected to proceed with Hormonal Injection.      The following portions of the patient's history were reviewed and updated as appropriate: allergies, current medications, past family history, past medical history, past social history, past surgical history and problem list.  Review of Systems Pertinent items are noted in HPI.    Objective:  LMP 02/22/2020 (Exact Date)    General:  alert, cooperative and no distress   Breasts:  not examined  Lungs: normal respiratory effort  Heart:  regular rate  Abdomen: soft, nontender   Vulva:  not evaluated  Vagina: not evaluated  Cervix:  not evaluated  Corpus: not examined  Adnexa:  not evaluated  Rectal Exam: Not performed.        Assessment:    Normal postpartum exam. Pap smear not done at today's visit, completed 07/2020.  Plan:   Essential components of care per ACOG recommendations:  1.  Mood and well being: Patient with negative depression screening today. Reviewed local resources for support.  - Patient does not use tobacco.  - hx of drug use? No   - hx of depression/PTSD/SI-->following with BH, upcoming appt 12/08/20. Has resources provided by Asher Muir. Doing well. Declines medication today.  2. Infant care and  feeding:  -Patient currently breastmilk feeding? Yes  -Social determinants of health (SDOH) reviewed in EPIC. Food insecurity, requesting to stop at kitchen today.  3. Sexuality, contraception and birth spacing - Patient does not want a pregnancy in the next year.  Desired family size is 6 children.  - Reviewed forms of contraception in tiered fashion. Patient desired Depo-Provera today.   - Discussed birth spacing of 18 months  4. Sleep and fatigue -Encouraged family/partner/community support of 4 hrs of uninterrupted sleep to help with mood and fatigue  5. Physical Recovery  - Discussed patients delivery and complications - Patient without laceration - Patient has urinary incontinence? No  - Patient is safe to resume physical and sexual activity  6.  Health Maintenance - Last pap smear done 07/2020 and was normal  7. Chronic Disease - PCP follow up, list of PCP provided  Needs repeat 2hr gtt in the setting of GDM.  Henrietta Dine, CMA Center for [employer], [facility]

## 2020-12-06 ENCOUNTER — Encounter: Payer: Self-pay | Admitting: General Practice

## 2020-12-06 ENCOUNTER — Telehealth: Payer: Self-pay | Admitting: General Practice

## 2020-12-06 ENCOUNTER — Other Ambulatory Visit: Payer: Self-pay | Admitting: Lactation Services

## 2020-12-06 DIAGNOSIS — O24419 Gestational diabetes mellitus in pregnancy, unspecified control: Secondary | ICD-10-CM

## 2020-12-06 NOTE — Telephone Encounter (Signed)
Patient called into front office with Raquel for interpreter stating she has had a headache today as well as body aches. Advised patient to take a couple doses of ibuprofen today & tomorrow and to call us back if new symptoms development especially problems with her breast. Also advised increased hydration. Patient verbalized understanding.

## 2020-12-08 ENCOUNTER — Ambulatory Visit: Payer: Self-pay | Admitting: Clinical

## 2020-12-08 ENCOUNTER — Other Ambulatory Visit: Payer: Self-pay

## 2020-12-08 DIAGNOSIS — F4310 Post-traumatic stress disorder, unspecified: Secondary | ICD-10-CM

## 2020-12-08 DIAGNOSIS — O24419 Gestational diabetes mellitus in pregnancy, unspecified control: Secondary | ICD-10-CM

## 2020-12-08 DIAGNOSIS — R45851 Suicidal ideations: Secondary | ICD-10-CM

## 2020-12-08 DIAGNOSIS — F32A Depression, unspecified: Secondary | ICD-10-CM

## 2020-12-08 NOTE — Progress Notes (Addendum)
error 

## 2020-12-08 NOTE — BH Specialist Note (Signed)
I reviewed patient visit with the [facility] Intern, and I concur with the treatment plan, as documented in the [facility] Intern note.   No charge for this visit due to [facility] Intern seeing patient.  Hulda Marin, MSW, LCSW Integrated Behavioral Health Clinician Center for [facility] at [facility]  .Integrated Behavioral Health Follow Up In-Person Visit  MRN: [id number] Name: [name]  Number of Integrated Behavioral Health Clinician visits: 2/6 Session Start time: 9:00  Session End time: 10:00 Total time: 60 minutes  Types of Service: Individual psychotherapy  Interpretor:Yes.   Interpretor Name and Language: [name] [id number]  Subjective: [name] is a 40 y.o. female accompanied by her newborn Patient was referred by Catalina Antigua, MD for PTSD and depression. Patient reports the following symptoms/concerns: concerned about asking friends for help because they may talk about her to others, feeling responsible to take care of friends who are struggling, feelings of frustration related to conflicts in family Duration of problem: Ongoing; Severity of problem: moderate  Objective: Mood: Euthymic and Affect: Appropriate Risk of harm to self or others: No plan to harm self or others  Life Context: Family and Social:Six children, her oldest daughter is 69 and helps her when her daughter is not at work School/Work: Wants to get her GED and apply for a Visa Self-Care: Attend church, cleaning the house, praying, listen to [place] music, taking pictures, taking care of flowers (roses) Life Changes: Recent childbirth  Patient and/or Family's Strengths/Protective Factors: Social connections, Concrete supports in place (healthy food, safe environments, etc.), Sense of purpose and Parental Resilience  Goals Addressed: Patient will: 1.  Reduce symptoms of: depression  2.  Increase knowledge and/or ability of: coping skills and stress reduction  3.  Demonstrate  ability to: Increase healthy adjustment to current life circumstances  Progress towards Goals: Ongoing  Interventions: Interventions utilized:  Motivational Interviewing, CBT Cognitive Behavioral Therapy and Supportive Counseling Standardized Assessments completed: Not Needed due to being completed in the last two weeks  Patient and/or Family Response: Pt expressed understanding and willingness to adhere to treatment plan.  Patient Centered Plan: Patient is on the following Treatment Plan(s): IBH Assessment: Patient currently experiencing Posttraumatic stress disorder and Depression.   Patient may benefit from increasing coping skills and reframing negative thoughts.  Plan: 1. Follow up with behavioral health clinician on : 12/21/2020 at 8:45AM 2. Behavioral recommendations: Practice coping skills, recognize and identify emotions tied to pictures and planning something to look forward to, practice deep breathing (box breathing) 3. Referral(s): Integrated [employer] (In Clinic) 4. "From scale of 1-10, how likely are you to follow plan?": 8  [name] (Supervisor: Hulda Marin)

## 2020-12-09 LAB — GLUCOSE TOLERANCE, 2 HOURS
Glucose, 2 hour: 68 mg/dL (ref 65–139)
Glucose, GTT - Fasting: 100 mg/dL — ABNORMAL HIGH (ref 65–99)

## 2020-12-13 ENCOUNTER — Telehealth: Payer: Self-pay | Admitting: *Deleted

## 2020-12-13 DIAGNOSIS — E118 Type 2 diabetes mellitus with unspecified complications: Secondary | ICD-10-CM

## 2020-12-13 NOTE — Telephone Encounter (Signed)
I called Keta with Interpreter Raquel Carrolyn Meiers and heard a message voicemail is full. Errol Ala,RN

## 2020-12-13 NOTE — Telephone Encounter (Signed)
I called [name] with Interpreter [employer] and informed her results and that she failed her postpartum diabetes screening and now has diabetes and needs to follow up with her PCP. She does not have a PCP and we discussed I will send a referral to [facility] and advised her to call there for appointment .  She may also go to [facility] if desired. We asked her to call back if she is unable to get an appointment. She also c/o sore nipples that are cracked. Discussed with Dr. Crissie Reese and advised patient may use coconut oil sparingly. She voices understanding. Brandii Lakey,RN

## 2020-12-13 NOTE — Telephone Encounter (Signed)
-----   Message from Alric Seton, MD sent at 12/12/2020  8:53 AM EDT ----- NEED Jamse Mead  Please call patient and notify she  has failed her postpartum diabetes test which means she has regular diabetes outside of pregnancy. Please inform patient she must follow up with her primary care provider--please supply her a list of local PCPs if she does not have one. Thanks!  Alric Seton, MD OB Fellow, Faculty [facility], Center for [facility] 12/12/2020 8:51 AM

## 2020-12-21 ENCOUNTER — Ambulatory Visit: Payer: HRSA Program

## 2020-12-21 NOTE — BH Specialist Note (Unsigned)
Pt did not arrive to in person visit and requested to reschedule due to a family member being in the hospital. Intern provided the phone number for [employer] [facility] at [facility] at [phone] (main office) or [phone] (Jamie's office).Pt is rescheduled for 01/03/2021 at 8:15AM for a virtual or telephone appointment pending her ability to join a virtual visit.  Bea Graff (Supervisor: Hulda Marin)

## 2020-12-26 NOTE — BH Specialist Note (Signed)
Integrated Behavioral Health via Telemedicine Visit  12/26/2020 [name] [id number]  Number of Integrated Behavioral Health visits: 3 Session Start time: 8:21  Session End time: 8:36 Total time: 15  Referring Provider: Catalina Antigua, MD Patient/Family location: Home [facility] Provider location: Center for [facility] at [facility]  All persons participating in visit: Patient [name] and [facility] Elowen Debruyn   Types of Service: Individual psychotherapy and Video visit  I connected with [name] and/or [employer] Spanish interpreter, [place], [phone] via  Telephone or Video Enabled Telemedicine Application  (Video is Caregility application) and verified that I am speaking with the correct person using two identifiers. Discussed confidentiality: Yes   I discussed the limitations of telemedicine and the availability of in person appointments.  Discussed there is a possibility of technology failure and discussed alternative modes of communication if that failure occurs.  I discussed that engaging in this telemedicine visit, they consent to the provision of behavioral healthcare and the services will be billed under their insurance.  Patient and/or legal guardian expressed understanding and consented to Telemedicine visit: Yes   Presenting Concerns: Patient and/or family reports the following symptoms/concerns: Pt states her most recent concerns were feeling overwhelmed when baby and toddler cried at the same time, along with phone calls causing her to feel irritable and disorganized. Pt is now coping well by using deep breathing exercise when children cry, calming herself first, then feeding and holding children, and allowing phone calls to go to voicemail.  Duration of problem: Postpartum; Severity of problem: moderate  Patient and/or Family's Strengths/Protective Factors: Social connections, Social and Emotional competence, Concrete supports in  place (healthy food, safe environments, etc.), Sense of purpose and Parental Resilience  Goals Addressed: Patient will: 1.  Reduce symptoms of: depression and stress  2.  Demonstrate ability to: Increase healthy adjustment to current life circumstances  Progress towards Goals: Ongoing  Interventions: Interventions utilized:  Supportive Counseling Standardized Assessments completed: Not Needed  Patient and/or Family Response: Pt agrees with treatment plan  Assessment: Patient currently experiencing PTSD.   Patient may benefit from supportive counseling today    Plan: 1. Follow up with behavioral health clinician on : Call Kiele Heavrin at [phone] as needed 2. Behavioral recommendations:  -Make sure voicemail is not full, to receive any messages from [facility], to set up initial appointment -Continue using self-coping strategies that have been helpful in managing emotions 3. Referral(s): Integrated [employer] (In Clinic)  I discussed the assessment and treatment plan with the patient and/or parent/guardian. They were provided an opportunity to ask questions and all were answered. They agreed with the plan and demonstrated an understanding of the instructions.   They were advised to call back or seek an in-person evaluation if the symptoms worsen or if the condition fails to improve as anticipated.  Rae Lips, LCSW   Depression screen [facility] 2/9 11/28/2020 11/04/2020 10/26/2020 09/27/2020 11/21/2018  Decreased Interest 0 2 1 0 2  Down, Depressed, Hopeless 0 2 2 0 0  PHQ - 2 Score 0 4 3 0 2  Altered sleeping 0 2 1 0 2  Tired, decreased energy 0 2 1 0 2  Change in appetite 2 1 1  0 2  Feeling bad or failure about yourself  3 3 2  0 2  Trouble concentrating 0 0 0 0 0  Moving slowly or fidgety/restless 0 0 0 0 0  Suicidal thoughts 1 2 0 0 0  PHQ-9 Score 6 14 8  0 10   GAD 7 : Generalized Anxiety Score 11/28/2020 11/04/2020 10/26/2020  09/27/2020  Nervous, Anxious, on Edge 1 1 1  0  Control/stop worrying 0 0 0 0  Worry too much - different things 2 3 1  0  Trouble relaxing 1 1 0 0  Restless 0 0 0 0  Easily annoyed or irritable 2 3 2  0  Afraid - awful might happen 0 3 1 0  Total GAD 7 Score 6 11 5  0

## 2021-01-03 ENCOUNTER — Ambulatory Visit: Payer: HRSA Program | Admitting: Clinical

## 2021-01-03 DIAGNOSIS — F4310 Post-traumatic stress disorder, unspecified: Secondary | ICD-10-CM

## 2021-01-03 NOTE — Patient Instructions (Signed)
[facility] at [facility] [address] [place], [place] [address] [phone] (main office) [phone] (Yalda Herd's office)   

## 2021-10-23 ENCOUNTER — Encounter (HOSPITAL_COMMUNITY): Payer: Self-pay | Admitting: *Deleted

## 2021-10-23 ENCOUNTER — Emergency Department (HOSPITAL_COMMUNITY)
Admission: EM | Admit: 2021-10-23 | Discharge: 2021-10-23 | Disposition: A | Discharge status: Home / Self Care | Payer: Medicaid Other | Attending: Emergency Medicine | Admitting: Emergency Medicine

## 2021-10-23 ENCOUNTER — Other Ambulatory Visit: Payer: Self-pay

## 2021-10-23 DIAGNOSIS — R519 Headache, unspecified: Secondary | ICD-10-CM

## 2021-10-23 DIAGNOSIS — K0889 Other specified disorders of teeth and supporting structures: Secondary | ICD-10-CM | POA: Insufficient documentation

## 2021-10-23 MED ORDER — NAPROXEN 500 MG PO TABS: 500.0000 mg | ORAL_TABLET | Freq: Two times a day (BID) | ORAL | 0 refills | Status: DC

## 2021-10-23 MED ORDER — PENICILLIN V POTASSIUM 250 MG PO TABS
500.0000 mg | ORAL_TABLET | Freq: Once | ORAL | Status: AC
  Administered 2021-10-23: 500 mg via ORAL
  Filled 2021-10-23 (×2): qty 2

## 2021-10-23 MED ORDER — OXYCODONE-ACETAMINOPHEN 5-325 MG PO TABS
1.0000 | ORAL_TABLET | Freq: Once | ORAL | Status: AC
  Administered 2021-10-23: 1 via ORAL
  Filled 2021-10-23: qty 1

## 2021-10-23 MED ORDER — PENICILLIN V POTASSIUM 500 MG PO TABS: 500.0000 mg | ORAL_TABLET | Freq: Four times a day (QID) | ORAL | 0 refills | Status: AC

## 2021-10-23 NOTE — ED Provider Notes (Signed)
[facility] EMERGENCY DEPARTMENT Provider Note   CSN: [id number] Arrival date & time: 10/23/21  2152     History  Chief Complaint  Patient presents with   Headache    [name] is a 41 y.o. female.  The history is provided by the patient and medical records. The history is limited by a language barrier. A language interpreter was used.   41 year old female presenting to the ED with right-sided headache.  States that started around 11:00 this morning.  Pain radiates to the right side of her face and into the top of her head on the right.  She has not had any dizziness, blurred vision, numbness or weakness.  Does have a feeling on her right lower molar that recently fell out, unsure if this is related to her symptoms.  She has not had any fever or trouble swallowing.  Does not currently have a dentist that she sees.  She did take Tylenol prior to arrival without relief.  Home Medications Prior to Admission medications   Medication Sig Start Date End Date Taking? Authorizing Provider  naproxen (NAPROSYN) 500 MG tablet Take 1 tablet (500 mg total) by mouth 2 (two) times daily. 10/23/21  Yes Garlon Hatchet, PA-C  penicillin v potassium (VEETID) 500 MG tablet Take 1 tablet (500 mg total) by mouth 4 (four) times daily for 10 days. 10/23/21 11/02/21 Yes Garlon Hatchet, PA-C  acetaminophen (TYLENOL) 325 MG tablet Take 2 tablets (650 mg total) by mouth every 6 (six) hours as needed for mild pain, moderate pain, fever or headache (for pain scale < 4). 11/08/20   Sheila Oats, MD  amoxicillin-clavulanate (AUGMENTIN) 875-125 MG tablet TAKE 1 TABLET BY MOUTH TWO TIMES DAILY FOR 3 DAYS. 11/04/20 11/04/21  Warden Fillers, MD  coconut oil OIL Apply 1 application topically as needed (nipple pain). 11/08/20   Sheila Oats, MD  ferrous sulfate 325 (65 FE) MG tablet Take 1 tablet (325 mg total) by mouth every other day. 11/08/20   Sheila Oats, MD  ferrous sulfate 325 (65 FE) MG  tablet Take 325 mg by mouth daily with breakfast.    [provider]  ibuprofen (ADVIL) 600 MG tablet Take 1 tablet (600 mg total) by mouth every 8 (eight) hours as needed for moderate pain or cramping. 11/08/20   Sheila Oats, MD  ondansetron (ZOFRAN-ODT) 4 MG disintegrating tablet TAKE 1 TABLET (4 MG TOTAL) BY MOUTH EVERY SIX HOURS AS NEEDED FOR NAUSEA OR VOMITING. 11/04/20 11/04/21  Warden Fillers, MD  oxyCODONE (OXY IR/ROXICODONE) 5 MG immediate release tablet Take 1 tablet (5 mg total) by mouth every 6 (six) hours as needed for breakthrough pain or severe pain. Patient not taking: Reported on 12/05/2020 11/08/20   Sheila Oats, MD  pantoprazole (PROTONIX) 20 MG tablet TAKE 1 TABLET (20 MG TOTAL) BY MOUTH TWO TIMES DAILY. 11/04/20 11/04/21  Warden Fillers, MD  polyethylene glycol (MIRALAX / GLYCOLAX) 17 g packet Take 17 g by mouth daily. 11/08/20   Sheila Oats, MD  Prenatal Vit-Fe Fumarate-FA (PRENATAL MULTIVITAMIN) TABS tablet Take 1 tablet by mouth daily at 12 noon.    [provider]      Allergies    Hydrocodone-acetaminophen    Review of Systems   Review of Systems  HENT:  Positive for dental problem.   All other systems reviewed and are negative.  Physical Exam Updated Vital Signs BP (!) 141/92    Pulse  70    Temp 98 F (36.7 C) (Oral)    Resp 16    SpO2 100%   Physical Exam Vitals and nursing note reviewed.  Constitutional:      General: She is not in acute distress.    Appearance: She is well-developed. She is not diaphoretic.  HENT:     Head: Normocephalic and atraumatic.     Right Ear: External ear normal.     Left Ear: External ear normal.     Mouth/Throat:     Comments: Teeth largely in fair dentition,  right lower second molar with missing filling, surrounding gingiva normal in appearance, handling secretions appropriately, no trismus, no facial or neck swelling, normal phonation without stridor Eyes:     Conjunctiva/sclera: Conjunctivae  normal.     Pupils: Pupils are equal, round, and reactive to light.  Neck:     Comments: No rigidity, no meningismus Cardiovascular:     Rate and Rhythm: Normal rate and regular rhythm.     Heart sounds: Normal heart sounds. No murmur heard. Pulmonary:     Effort: Pulmonary effort is normal. No respiratory distress.     Breath sounds: Normal breath sounds. No wheezing or rhonchi.  Abdominal:     General: Bowel sounds are normal.     Palpations: Abdomen is soft.     Tenderness: There is no abdominal tenderness. There is no guarding.  Musculoskeletal:        General: Normal range of motion.     Cervical back: Full passive range of motion without pain, normal range of motion and neck supple. No rigidity.  Skin:    General: Skin is warm and dry.     Findings: No rash.  Neurological:     Mental Status: [name] and oriented to person, place, and time.     Cranial Nerves: No cranial nerve deficit.     Sensory: No sensory deficit.     Motor: No tremor or seizure activity.     Comments: AAOx3, answering questions and following commands appropriately; equal strength UE and LE bilaterally; CN grossly intact; moves all extremities appropriately without ataxia; no focal neuro deficits or facial asymmetry appreciated  Psychiatric:        Behavior: Behavior normal.        Thought Content: Thought content normal.    ED Results / Procedures / Treatments   Labs (all labs ordered are listed, but only abnormal results are displayed) Labs Reviewed - No data to display  EKG None  Radiology No results found.  Procedures Procedures    Medications Ordered in ED Medications  penicillin v potassium (VEETID) tablet 500 mg (500 mg Oral Given 10/23/21 2232)  oxyCODONE-acetaminophen (PERCOCET/ROXICET) 5-325 MG per tablet 1 tablet (1 tablet Oral Given 10/23/21 2232)    ED Course/ Medical Decision Making/ A&P                           Medical Decision Making Risk Prescription drug  management.   41 year old female presenting to the ED with right-sided headache since 11 AM this morning.  Appears to be from dental source as her right lower molar filling has come out.  She does not have any facial or neck swelling, handling secretions well, no stridor.  Headache without focal deficits concerning for TIA or CVA.  No meningeal signs.  Will start on antibiotics and refer to dentist for follow-up.  May return here for any new/acute  changes.  Final Clinical Impression(s) / ED Diagnoses Final diagnoses:  Pain, dental  Acute nonintractable headache, unspecified headache type    Rx / DC Orders ED Discharge Orders          Ordered    penicillin v potassium (VEETID) 500 MG tablet  4 times daily        10/23/21 2223    naproxen (NAPROSYN) 500 MG tablet  2 times daily        10/23/21 2223              Garlon Hatchet, PA-C 10/23/21 2302    Zadie Rhine, MD 10/24/21 2306

## 2021-10-23 NOTE — Discharge Instructions (Signed)
Take the prescribed medication as directed. Follow-up with dentist as soon as possible.  Call in the morning for appt. Return to the ED for new or worsening symptoms.

## 2021-10-23 NOTE — ED Triage Notes (Signed)
Via spanish interpreter, pt is having pain in the right side of the face and goes to the back of the head, started at 11 am, took tylenol for the same. Filling from a cavity fell off the right lower side.

## 2022-03-22 ENCOUNTER — Encounter (HOSPITAL_COMMUNITY): Payer: Self-pay

## 2022-03-22 ENCOUNTER — Emergency Department (HOSPITAL_COMMUNITY)
Admission: EM | Admit: 2022-03-22 | Discharge: 2022-03-23 | Discharge status: Against Medical Advice | Payer: Self-pay | Attending: Student | Admitting: Student

## 2022-03-22 DIAGNOSIS — K0889 Other specified disorders of teeth and supporting structures: Secondary | ICD-10-CM | POA: Insufficient documentation

## 2022-03-22 DIAGNOSIS — H538 Other visual disturbances: Secondary | ICD-10-CM | POA: Insufficient documentation

## 2022-03-22 DIAGNOSIS — Z5321 Procedure and treatment not carried out due to patient leaving prior to being seen by health care provider: Secondary | ICD-10-CM | POA: Insufficient documentation

## 2022-03-22 DIAGNOSIS — R6884 Jaw pain: Secondary | ICD-10-CM | POA: Insufficient documentation

## 2022-03-22 DIAGNOSIS — R42 Dizziness and giddiness: Secondary | ICD-10-CM | POA: Insufficient documentation

## 2022-03-22 LAB — BASIC METABOLIC PANEL
Anion gap: 12 (ref 5–15)
BUN: 12 mg/dL (ref 6–20)
CO2: 20 mmol/L — ABNORMAL LOW (ref 22–32)
Calcium: 8.6 mg/dL — ABNORMAL LOW (ref 8.9–10.3)
Chloride: 108 mmol/L (ref 98–111)
Creatinine, Ser: 0.63 mg/dL (ref 0.44–1.00)
GFR, Estimated: >60 mL/min (ref >60)
Glucose, Bld: 132 mg/dL — ABNORMAL HIGH (ref 70–99)
Potassium: 3.6 mmol/L (ref 3.5–5.1)
Sodium: 140 mmol/L (ref 135–145)

## 2022-03-22 LAB — URINALYSIS, ROUTINE W REFLEX MICROSCOPIC
Bilirubin Urine: NEGATIVE
Glucose, UA: NEGATIVE mg/dL
Hgb urine dipstick: NEGATIVE
Ketones, ur: NEGATIVE mg/dL
Leukocytes,Ua: NEGATIVE
Nitrite: NEGATIVE
Protein, ur: NEGATIVE mg/dL
Specific Gravity, Urine: 1.023 (ref 1.005–1.030)
pH: 5 (ref 5.0–8.0)

## 2022-03-22 LAB — CBC
HCT: 32.5 % — ABNORMAL LOW (ref 36.0–46.0)
Hemoglobin: 10.7 g/dL — ABNORMAL LOW (ref 12.0–15.0)
MCH: 29.3 pg (ref 26.0–34.0)
MCHC: 32.9 g/dL (ref 30.0–36.0)
MCV: 89 fL (ref 80.0–100.0)
Platelets: 324 10*3/uL (ref 150–400)
RBC: 3.65 MIL/uL — ABNORMAL LOW (ref 3.87–5.11)
RDW: 12.4 % (ref 11.5–15.5)
WBC: 8.2 10*3/uL (ref 4.0–10.5)
nRBC: 0 % (ref 0.0–0.2)

## 2022-03-22 NOTE — ED Triage Notes (Signed)
Pt states that she is having dental pain on the R lower jaw and dizziness that started yesterday with blurry vision with both eyes, neuro intact

## 2022-03-23 LAB — I-STAT BETA HCG BLOOD, ED (MC, WL, AP ONLY): I-stat hCG, quantitative: <5 m[IU]/mL (ref <5)

## 2022-03-23 MED ORDER — ONDANSETRON 4 MG PO TBDP
4.0000 mg | ORAL_TABLET | Freq: Once | ORAL | Status: AC
  Administered 2022-03-23: 4 mg via ORAL
  Filled 2022-03-23: qty 1

## 2022-03-23 NOTE — ED Notes (Signed)
Patient states she has to go home d/t wait time

## 2022-06-14 ENCOUNTER — Emergency Department (HOSPITAL_COMMUNITY)
Admission: EM | Admit: 2022-06-14 | Discharge: 2022-06-15 | Discharge status: Against Medical Advice | Payer: Self-pay | Attending: Emergency Medicine | Admitting: Emergency Medicine

## 2022-06-14 ENCOUNTER — Encounter (HOSPITAL_COMMUNITY): Payer: Self-pay | Admitting: Emergency Medicine

## 2022-06-14 ENCOUNTER — Other Ambulatory Visit: Payer: Self-pay

## 2022-06-14 DIAGNOSIS — M546 Pain in thoracic spine: Secondary | ICD-10-CM | POA: Insufficient documentation

## 2022-06-14 DIAGNOSIS — R109 Unspecified abdominal pain: Secondary | ICD-10-CM | POA: Insufficient documentation

## 2022-06-14 DIAGNOSIS — Z5321 Procedure and treatment not carried out due to patient leaving prior to being seen by health care provider: Secondary | ICD-10-CM | POA: Insufficient documentation

## 2022-06-14 LAB — CBC WITH DIFFERENTIAL/PLATELET
Abs Immature Granulocytes: 0.03 10*3/uL (ref 0.00–0.07)
Basophils Absolute: 0.1 10*3/uL (ref 0.0–0.1)
Basophils Relative: 1 %
Eosinophils Absolute: 0.2 10*3/uL (ref 0.0–0.5)
Eosinophils Relative: 2 %
HCT: 34.3 % — ABNORMAL LOW (ref 36.0–46.0)
Hemoglobin: 11.4 g/dL — ABNORMAL LOW (ref 12.0–15.0)
Immature Granulocytes: 0 %
Lymphocytes Relative: 31 %
Lymphs Abs: 3.4 10*3/uL (ref 0.7–4.0)
MCH: 29 pg (ref 26.0–34.0)
MCHC: 33.2 g/dL (ref 30.0–36.0)
MCV: 87.3 fL (ref 80.0–100.0)
Monocytes Absolute: 0.7 10*3/uL (ref 0.1–1.0)
Monocytes Relative: 6 %
Neutro Abs: 6.6 10*3/uL (ref 1.7–7.7)
Neutrophils Relative %: 60 %
Platelets: 397 10*3/uL (ref 150–400)
RBC: 3.93 MIL/uL (ref 3.87–5.11)
RDW: 12 % (ref 11.5–15.5)
WBC: 11.1 10*3/uL — ABNORMAL HIGH (ref 4.0–10.5)
nRBC: 0 % (ref 0.0–0.2)

## 2022-06-14 LAB — COMPREHENSIVE METABOLIC PANEL
ALT: 22 U/L (ref 0–44)
AST: 23 U/L (ref 15–41)
Albumin: 3.8 g/dL (ref 3.5–5.0)
Alkaline Phosphatase: 90 U/L (ref 38–126)
Anion gap: 7 (ref 5–15)
BUN: 11 mg/dL (ref 6–20)
CO2: 26 mmol/L (ref 22–32)
Calcium: 8.6 mg/dL — ABNORMAL LOW (ref 8.9–10.3)
Chloride: 106 mmol/L (ref 98–111)
Creatinine, Ser: 0.63 mg/dL (ref 0.44–1.00)
GFR, Estimated: >60 mL/min (ref >60)
Glucose, Bld: 108 mg/dL — ABNORMAL HIGH (ref 70–99)
Potassium: 3.3 mmol/L — ABNORMAL LOW (ref 3.5–5.1)
Sodium: 139 mmol/L (ref 135–145)
Total Bilirubin: 0.4 mg/dL (ref 0.3–1.2)
Total Protein: 7.1 g/dL (ref 6.5–8.1)

## 2022-06-14 LAB — URINALYSIS, ROUTINE W REFLEX MICROSCOPIC
Bilirubin Urine: NEGATIVE
Glucose, UA: NEGATIVE mg/dL
Hgb urine dipstick: NEGATIVE
Ketones, ur: NEGATIVE mg/dL
Leukocytes,Ua: NEGATIVE
Nitrite: NEGATIVE
Protein, ur: NEGATIVE mg/dL
Specific Gravity, Urine: 1.024 (ref 1.005–1.030)
pH: 5 (ref 5.0–8.0)

## 2022-06-14 NOTE — ED Provider Triage Note (Signed)
Emergency Medicine Provider Triage Evaluation Note  [name] , a 41 y.o. female  was evaluated in triage.  Pt complains of right-sided flank pain for 2 days with associated urinary symptoms.  Patient states she has had back pain for 3 years however in the last 2 days her back pain is now worsened and on the right side.  The patient denies history of kidney stones.  Patient denies any fevers however does endorse nausea without vomiting.  Patient denies diarrhea.  Review of Systems  Positive:  Negative:   Physical Exam  BP (!) 123/90 (BP Location: Left Arm)   Pulse 82   Temp 98 F (36.7 C) (Oral)   Resp 17   Ht 5\' 2"  (1.575 m)   Wt 97.1 kg   SpO2 98%   BMI 39.14 kg/m  Gen:   Awake, no distress   Resp:  Normal effort  MSK:   Moves extremities without difficulty  Other:  Right-sided CVA tenderness  Medical Decision Making  Medically screening exam initiated at 9:12 PM.  Appropriate orders placed.  [name] was informed that the remainder of the evaluation will be completed by another provider, this initial triage assessment does not replace that evaluation, and the importance of remaining in the ED until their evaluation is complete.     Azucena Cecil, PA-C 06/14/22 2112

## 2022-06-14 NOTE — ED Triage Notes (Signed)
Pt c/o right sided upper back pain X3 years however today it has been worse.  "It hurts so much I can't breath.  I feel like it is on my lungs."

## 2022-06-15 NOTE — ED Notes (Addendum)
Pt called for CT, no response. Pt not seen in lobby, assumed left.

## 2022-08-07 ENCOUNTER — Encounter (HOSPITAL_COMMUNITY): Payer: Self-pay | Admitting: Emergency Medicine

## 2022-08-07 ENCOUNTER — Ambulatory Visit (HOSPITAL_COMMUNITY)
Admission: EM | Admit: 2022-08-07 | Discharge: 2022-08-07 | Disposition: A | Discharge status: Home / Self Care | Payer: Self-pay | Attending: Emergency Medicine | Admitting: Emergency Medicine

## 2022-08-07 DIAGNOSIS — K529 Noninfective gastroenteritis and colitis, unspecified: Secondary | ICD-10-CM

## 2022-08-07 MED ORDER — ONDANSETRON 4 MG PO TBDP
4.0000 mg | ORAL_TABLET | Freq: Once | ORAL | Status: AC
  Administered 2022-08-07: 4 mg via ORAL

## 2022-08-07 MED ORDER — ONDANSETRON HCL 4 MG PO TABS: 4.0000 mg | ORAL_TABLET | Freq: Four times a day (QID) | ORAL | 0 refills | Status: AC

## 2022-08-07 MED ORDER — ONDANSETRON 4 MG PO TBDP
ORAL_TABLET | ORAL | Status: AC
  Filled 2022-08-07: qty 1

## 2022-08-07 NOTE — ED Triage Notes (Signed)
Pt reports that since Sunday having body aches, n/v/d, fatigue and headache. Denies taking any medications

## 2022-08-07 NOTE — ED Notes (Signed)
Pt in lobby restroom  

## 2022-08-07 NOTE — ED Provider Notes (Signed)
[name]    CSN: [id number] Arrival date & time: 08/07/22  1814     History   Chief Complaint Chief Complaint  Patient presents with   Abdominal Pain   Emesis    HPI [name] is a 41 y.o. female.  Presents with 2-day history of bodyaches, fatigue, nausea She has had about 3 episodes of vomiting over the past 2 days.  Multiple episodes of diarrhea.  Denies any blood in the vomit or stool. No fevers. She has not been drinking much fluids.  Reports food comes back up if she tries to eat.  Denies recent travel, new medications, sick contacts  LMP started today She is currently breast-feeding  Past Medical History:  Diagnosis Date   Anxiety    relative to circumstances to back problems/injury, not taking any medicine, pt. reports has been crying a lot   Arthritis    low back   Asthma    Back pain    Cholelithiasis 04/08/2020   Depression    GERD (gastroesophageal reflux disease)    Migraines    Neck pain     Patient Active Problem List   Diagnosis Date Noted   Unstable lie of fetus 11/03/2020   Cholecystitis, acute 11/03/2020   Cholestasis of pregnancy 10/14/2020   Supervision of high risk pregnancy, antepartum 09/27/2020   Maternal obesity affecting pregnancy, antepartum 09/27/2020   Gestational diabetes mellitus (GDM) affecting pregnancy 09/27/2020   Cholelithiasis 04/08/2020   Vaginal delivery 12/27/2018   AMA (advanced maternal age) multigravida 35+ 12/26/2018   Language barrier 11/21/2018   ASCUS of cervix with negative high risk HPV 10/20/2018   PTSD (post-traumatic stress disorder) 05/29/2013   Depression with suicidal ideation 05/29/2013   Suicide attempt by multiple drug overdose ([place]) 05/29/2013   Chronic pain following surgery or procedure 05/29/2013   HNP (herniated nucleus pulposus), lumbar 02/04/2013    Past Surgical History:  Procedure Laterality Date   childbirth     x4 vaginal    LUMBAR LAMINECTOMY/DECOMPRESSION  MICRODISCECTOMY  06/05/2012   Procedure: LUMBAR LAMINECTOMY/DECOMPRESSION MICRODISCECTOMY;  Surgeon: Sinclair Ship, MD;  Location: [place];  Service: Orthopedics;  Laterality: Left;  Left sided lumbar 4-5 microdisectomy    OB History     Gravida  9   Para  5   Term  5   Preterm  0   AB  3   Living  5      SAB  0   IAB  2   Ectopic  1   Multiple  0   Live Births  5            Home Medications    Prior to Admission medications   Medication Sig Start Date End Date Taking? Authorizing Provider  acetaminophen (TYLENOL) 325 MG tablet Take 2 tablets (650 mg total) by mouth every 6 (six) hours as needed for mild pain, moderate pain, fever or headache (for pain scale < 4). 11/08/20   Randa Ngo, MD  coconut oil OIL Apply 1 application topically as needed (nipple pain). 11/08/20   Randa Ngo, MD  ibuprofen (ADVIL) 600 MG tablet Take 1 tablet (600 mg total) by mouth every 8 (eight) hours as needed for moderate pain or cramping. 11/08/20   Randa Ngo, MD  ondansetron (ZOFRAN) 4 MG tablet Take 1 tablet (4 mg total) by mouth every 6 (six) hours for 3 days. 08/07/22 08/10/22 Yes Colleene Swarthout, Vernice Jefferson    Family History Family History  Problem Relation Age of Onset   Asthma Brother     Social History Social History   Tobacco Use   Smoking status: Never   Smokeless tobacco: Never  Vaping Use   Vaping Use: Never used  Substance Use Topics   Alcohol use: No   Drug use: No     Allergies   Hydrocodone-acetaminophen   Review of Systems Review of Systems As per HPI  Physical Exam Triage Vital Signs ED Triage Vitals  Enc Vitals Group     BP 08/07/22 1910 113/81     Pulse Rate 08/07/22 1910 85     Resp 08/07/22 1910 18     Temp 08/07/22 1910 98.1 F (36.7 C)     Temp Source 08/07/22 1910 Oral     SpO2 08/07/22 1910 98 %     Weight --      Height --      Head Circumference --      Peak Flow --      Pain Score 08/07/22 1909 10     Pain Loc --       Pain Edu? --      Excl. in [place]? --    No data found.  Updated Vital Signs BP 113/81 (BP Location: Right Arm)   Pulse 85   Temp 98.1 F (36.7 C) (Oral)   Resp 18   LMP 07/09/2022   SpO2 98%   Physical Exam Vitals and nursing note reviewed.  Constitutional:      Appearance: Normal appearance.  HENT:     Mouth/Throat:     Mouth: Mucous membranes are moist.     Pharynx: Oropharynx is clear.  Eyes:     Conjunctiva/sclera: Conjunctivae normal.  Cardiovascular:     Rate and Rhythm: Normal rate and regular rhythm.     Heart sounds: Normal heart sounds.  Pulmonary:     Effort: Pulmonary effort is normal. No respiratory distress.     Breath sounds: Normal breath sounds.  Abdominal:     General: Bowel sounds are normal.     Palpations: Abdomen is soft.     Tenderness: There is abdominal tenderness. There is no right CVA tenderness, left CVA tenderness, guarding or rebound.     Comments: Generalized tenderness on exam but nonsevere and no guarding  Musculoskeletal:        General: Normal range of motion.  Skin:    General: Skin is warm and dry.  Neurological:     Mental Status: [name] and oriented to person, place, and time.      UC Treatments / Results  Labs (all labs ordered are listed, but only abnormal results are displayed) Labs Reviewed - No data to display  EKG  Radiology No results found.  Procedures Procedures   Medications Ordered in UC Medications  ondansetron (ZOFRAN-ODT) disintegrating tablet 4 mg (4 mg Oral Given 08/07/22 1941)    Initial Impression / Assessment and Plan / UC Course  I have reviewed the triage vital signs and the nursing notes.  Pertinent labs & imaging results that were available during my care of the patient were reviewed by me and considered in my medical decision making (see chart for details).  Zofran dose given in clinic.  Patient was able to sip water and keep it down after this medication.  Discussed possible  stomach virus.  Patient understands the importance of hydrating during the symptoms.  Sent Zofran to use every 6 hours as needed, otherwise symptoms  will take some time to clear with hydration. Discussed ED precautions including inability to tolerate fluids by mouth.  Patient agrees to plan.  Final Clinical Impressions(s) / UC Diagnoses   Final diagnoses:  Gastroenteritis     Discharge Instructions      You may have a stomach virus. It is important you stay hydrated while having vomiting and diarrhea.  The most important treatment is water water water!  You can take the Zofran (nausea medicine) every 6 hours as needed to settle the stomach. I have sent pills to the pharmacy that you swallow (not dissolve in mouth like the dose we gave you in clinic).  If symptoms worsen or you are unable to tolerate any fluids by mouth, you need to be seen in the emergency department.    ED Prescriptions     Medication Sig Dispense Auth. Provider   ondansetron (ZOFRAN) 4 MG tablet Take 1 tablet (4 mg total) by mouth every 6 (six) hours for 3 days. 12 tablet Harshini Trent, Lurena Joiner, PA-C      PDMP not reviewed this encounter.   Jeffre Enriques, Ray [place] 08/07/22 2031

## 2022-08-07 NOTE — Discharge Instructions (Addendum)
You may have a stomach virus. It is important you stay hydrated while having vomiting and diarrhea.  The most important treatment is water water water!  You can take the Zofran (nausea medicine) every 6 hours as needed to settle the stomach. I have sent pills to the pharmacy that you swallow (not dissolve in mouth like the dose we gave you in clinic).  If symptoms worsen or you are unable to tolerate any fluids by mouth, you need to be seen in the emergency department.

## 2022-11-05 ENCOUNTER — Encounter (HOSPITAL_COMMUNITY): Payer: Self-pay | Admitting: Emergency Medicine

## 2022-11-05 ENCOUNTER — Other Ambulatory Visit: Payer: Self-pay

## 2022-11-05 ENCOUNTER — Emergency Department (HOSPITAL_COMMUNITY)
Admission: EM | Admit: 2022-11-05 | Discharge: 2022-11-05 | Disposition: A | Discharge status: Home / Self Care | Payer: Self-pay | Attending: Emergency Medicine | Admitting: Emergency Medicine

## 2022-11-05 DIAGNOSIS — M5416 Radiculopathy, lumbar region: Secondary | ICD-10-CM | POA: Insufficient documentation

## 2022-11-05 DIAGNOSIS — R209 Unspecified disturbances of skin sensation: Secondary | ICD-10-CM | POA: Insufficient documentation

## 2022-11-05 MED ORDER — LIDOCAINE 5 % EX PTCH
2.0000 | MEDICATED_PATCH | CUTANEOUS | Status: DC
  Administered 2022-11-05: 2 via TRANSDERMAL
  Filled 2022-11-05: qty 2

## 2022-11-05 MED ORDER — PREDNISONE 50 MG PO TABS: 50.0000 mg | ORAL_TABLET | Freq: Every day | ORAL | 0 refills | Status: AC

## 2022-11-05 MED ORDER — OXYCODONE-ACETAMINOPHEN 5-325 MG PO TABS
1.0000 | ORAL_TABLET | Freq: Once | ORAL | Status: AC
  Administered 2022-11-05: 1 via ORAL
  Filled 2022-11-05: qty 1

## 2022-11-05 NOTE — Discharge Instructions (Signed)
You were seen in the emergency department today for yourback pain.  Your physical exam and vital signs are very reassuring.  The muscles in your low back are in what is called spasm, meaning they are inappropriately tightened up.  This can be quite painful.  To help with your pain you may take Tylenol and / or NSAID medication (such as ibuprofen or naproxen) to help with your pain.  Additionally, you have been prescribed a steroid which may help with nerve inflammation causing the tingling sensation down your left leg.   You may also utilize topical pain relief such as Biofreeze, IcyHot, or topical lidocaine patches.  I also recommend that you apply heat to the area, such as a hot shower or heating pad, and follow heat application with massage of the muscles that are most tight.  Please return to the emergency department if you develop any numbness/tingling/weakness in your arms or legs, any difficulty urinating, or urinary incontinence chest pain, shortness of breath, abdominal pain, nausea or vomiting that does not stop, or any other new severe symptoms.

## 2022-11-05 NOTE — ED Triage Notes (Signed)
Pt reports lower back pain that started two days ago.  No apparent injury or fall but She reports she does a lot of bending.  Pt states it is worse when she sits for a while, pain does not go down legs. Past surgery on back

## 2022-11-05 NOTE — ED Provider Notes (Signed)
  [facility] Provider Note   CSN: [id number] Arrival date & time: 11/05/22  2159     History {Add pertinent medical, surgical, social history, OB history to HPI:1} Chief Complaint  Patient presents with   Back Pain    [name] is a 42 y.o. female.  HPI     Home Medications Prior to Admission medications   Medication Sig Start Date End Date Taking? Authorizing Provider  acetaminophen (TYLENOL) 325 MG tablet Take 2 tablets (650 mg total) by mouth every 6 (six) hours as needed for mild pain, moderate pain, fever or headache (for pain scale < 4). 11/08/20   Randa Ngo, MD  coconut oil OIL Apply 1 application topically as needed (nipple pain). 11/08/20   Randa Ngo, MD  ibuprofen (ADVIL) 600 MG tablet Take 1 tablet (600 mg total) by mouth every 8 (eight) hours as needed for moderate pain or cramping. 11/08/20   Randa Ngo, MD      Allergies    Hydrocodone-acetaminophen    Review of Systems   Review of Systems  Physical Exam Updated Vital Signs BP 105/69 (BP Location: Right Arm)   Pulse 88   Temp 98.8 F (37.1 C) (Oral)   Resp 18   Ht 5' 2"$  (1.575 m)   Wt 108.9 kg   SpO2 98%   BMI 43.90 kg/m  Physical Exam  ED Results / Procedures / Treatments   Labs (all labs ordered are listed, but only abnormal results are displayed) Labs Reviewed - No data to display  EKG None  Radiology No results found.  Procedures Procedures  {Document cardiac monitor, telemetry assessment procedure when appropriate:1}  Medications Ordered in ED Medications - No data to display  ED Course/ Medical Decision Making/ A&P   {   Click here for ABCD2, HEART and other calculatorsREFRESH Note before signing :1}                          Medical Decision Making  ***  {Document critical care time when appropriate:1} {Document review of labs and clinical decision tools ie heart score, Chads2Vasc2 etc:1}  {Document your  independent review of radiology images, and any outside records:1} {Document your discussion with family members, caretakers, and with consultants:1} {Document social determinants of health affecting pt's care:1} {Document your decision making why or why not admission, treatments were needed:1} Final Clinical Impression(s) / ED Diagnoses Final diagnoses:  None    Rx / DC Orders ED Discharge Orders     None

## 2022-11-14 ENCOUNTER — Other Ambulatory Visit: Payer: Self-pay

## 2022-11-14 ENCOUNTER — Encounter (HOSPITAL_COMMUNITY): Payer: Self-pay | Admitting: *Deleted

## 2022-11-14 ENCOUNTER — Emergency Department (HOSPITAL_COMMUNITY)
Admission: EM | Admit: 2022-11-14 | Discharge: 2022-11-15 | Disposition: A | Discharge status: Home / Self Care | Payer: Self-pay | Attending: Emergency Medicine | Admitting: Emergency Medicine

## 2022-11-14 DIAGNOSIS — R112 Nausea with vomiting, unspecified: Secondary | ICD-10-CM | POA: Insufficient documentation

## 2022-11-14 DIAGNOSIS — R1013 Epigastric pain: Secondary | ICD-10-CM | POA: Insufficient documentation

## 2022-11-14 LAB — CBC WITH DIFFERENTIAL/PLATELET
Abs Immature Granulocytes: 0.04 10*3/uL (ref 0.00–0.07)
Basophils Absolute: 0 10*3/uL (ref 0.0–0.1)
Basophils Relative: 0 %
Eosinophils Absolute: 0.2 10*3/uL (ref 0.0–0.5)
Eosinophils Relative: 3 %
HCT: 31.9 % — ABNORMAL LOW (ref 36.0–46.0)
Hemoglobin: 10.7 g/dL — ABNORMAL LOW (ref 12.0–15.0)
Immature Granulocytes: 0 %
Lymphocytes Relative: 34 %
Lymphs Abs: 3.1 10*3/uL (ref 0.7–4.0)
MCH: 28.6 pg (ref 26.0–34.0)
MCHC: 33.5 g/dL (ref 30.0–36.0)
MCV: 85.3 fL (ref 80.0–100.0)
Monocytes Absolute: 0.6 10*3/uL (ref 0.1–1.0)
Monocytes Relative: 6 %
Neutro Abs: 5 10*3/uL (ref 1.7–7.7)
Neutrophils Relative %: 57 %
Platelets: 352 10*3/uL (ref 150–400)
RBC: 3.74 MIL/uL — ABNORMAL LOW (ref 3.87–5.11)
RDW: 12.9 % (ref 11.5–15.5)
WBC: 8.9 10*3/uL (ref 4.0–10.5)
nRBC: 0 % (ref 0.0–0.2)

## 2022-11-14 LAB — COMPREHENSIVE METABOLIC PANEL
ALT: 33 U/L (ref 0–44)
AST: 18 U/L (ref 15–41)
Albumin: 3.3 g/dL — ABNORMAL LOW (ref 3.5–5.0)
Alkaline Phosphatase: 82 U/L (ref 38–126)
Anion gap: 4 — ABNORMAL LOW (ref 5–15)
BUN: 9 mg/dL (ref 6–20)
CO2: 24 mmol/L (ref 22–32)
Calcium: 8.2 mg/dL — ABNORMAL LOW (ref 8.9–10.3)
Chloride: 105 mmol/L (ref 98–111)
Creatinine, Ser: 0.6 mg/dL (ref 0.44–1.00)
GFR, Estimated: >60 mL/min (ref >60)
Glucose, Bld: 104 mg/dL — ABNORMAL HIGH (ref 70–99)
Potassium: 3.2 mmol/L — ABNORMAL LOW (ref 3.5–5.1)
Sodium: 133 mmol/L — ABNORMAL LOW (ref 135–145)
Total Bilirubin: 0.8 mg/dL (ref 0.3–1.2)
Total Protein: 6.8 g/dL (ref 6.5–8.1)

## 2022-11-14 LAB — LIPASE, BLOOD: Lipase: 27 U/L (ref 11–51)

## 2022-11-14 NOTE — ED Provider Triage Note (Signed)
Emergency Medicine Provider Triage Evaluation Note  [name] , a 42 y.o. female  was evaluated in triage.  Pt complains of cramping epigastric abdominal pain.  Symptoms began 4 days ago and have gotten worse.  Reports nausea but denies vomiting.  Reports normal bowel movements.  Denies history of abdominal surgeries.  Denies chest pain or shortness of breath or fevers.  Denies dysuria, frequency or urgency.  Review of Systems  Positive: As above Negative: As above  Physical Exam  BP 115/74   Pulse 84   Temp 98.9 F (37.2 C)   Resp 18   Ht '5\' 2"'$  (1.575 m)   Wt 108.9 kg   LMP 10/16/2022   SpO2 98%   BMI 43.91 kg/m  Gen:   Awake, no distress   Resp:  Normal effort  MSK:   Moves extremities without difficulty  Other:  Mild epigastric abdominal tenderness patient  Medical Decision Making  Medically screening exam initiated at 9:24 PM.  Appropriate orders placed.  Edmae Rundlett was informed that the remainder of the evaluation will be completed by another provider, this initial triage assessment does not replace that evaluation, and the importance of remaining in the ED until their evaluation is complete.     Roylene Reason, [place] 11/14/22 2125

## 2022-11-14 NOTE — ED Triage Notes (Signed)
The pt is c/o epigastric pain with radiation into her rt BD WITH NAUSEA AND VOMITING for 4 days    lmp last month

## 2022-11-15 ENCOUNTER — Emergency Department (HOSPITAL_COMMUNITY): Payer: Self-pay

## 2022-11-15 LAB — URINALYSIS, ROUTINE W REFLEX MICROSCOPIC
Bilirubin Urine: NEGATIVE
Glucose, UA: NEGATIVE mg/dL
Hgb urine dipstick: NEGATIVE
Ketones, ur: NEGATIVE mg/dL
Nitrite: NEGATIVE
Protein, ur: NEGATIVE mg/dL
Specific Gravity, Urine: 1.02 (ref 1.005–1.030)
pH: 6 (ref 5.0–8.0)

## 2022-11-15 MED ORDER — OMEPRAZOLE 20 MG PO CPDR: 20.0000 mg | DELAYED_RELEASE_CAPSULE | Freq: Every day | ORAL | 0 refills | Status: DC

## 2022-11-15 MED ORDER — ALUM & MAG HYDROXIDE-SIMETH 200-200-20 MG/5ML PO SUSP
30.0000 mL | Freq: Once | ORAL | Status: AC
  Administered 2022-11-15: 30 mL via ORAL
  Filled 2022-11-15: qty 30

## 2022-11-15 NOTE — ED Provider Notes (Signed)
[facility] Provider Note   CSN: [id number] Arrival date & time: 11/14/22  2103     History  Chief Complaint  Patient presents with   Abdominal Pain    [name] is a 42 y.o. female.  HPI   Patient not sent medical history presented with complaints of abdominal pain, started on Tuesday, states is intermittent, slightly worse today, pain is mainly in her epigastric region does not radiate, associated nausea vomiting still passing gas having normal bowel movements denies any urinary symptoms, no endorse any chest pain shortness of breath no pleuritic chest pain.  She describes the pain as a burning-like sensation.  No abdominal surgeries denies excessive alcohol use or NSAID use has no other complaints.  Home Medications Prior to Admission medications   Medication Sig Start Date End Date Taking? Authorizing Provider  omeprazole (PRILOSEC) 20 MG capsule Take 1 capsule (20 mg total) by mouth daily. 11/15/22 12/15/22 Yes Marcello Fennel, PA-C  acetaminophen (TYLENOL) 325 MG tablet Take 2 tablets (650 mg total) by mouth every 6 (six) hours as needed for mild pain, moderate pain, fever or headache (for pain scale < 4). 11/08/20   Randa Ngo, MD  coconut oil OIL Apply 1 application topically as needed (nipple pain). 11/08/20   Randa Ngo, MD  ibuprofen (ADVIL) 600 MG tablet Take 1 tablet (600 mg total) by mouth every 8 (eight) hours as needed for moderate pain or cramping. 11/08/20   Randa Ngo, MD      Allergies    Hydrocodone-acetaminophen    Review of Systems   Review of Systems  Constitutional:  Negative for chills and fever.  Respiratory:  Negative for shortness of breath.   Cardiovascular:  Negative for chest pain.  Gastrointestinal:  Positive for abdominal pain and nausea. Negative for vomiting.  Neurological:  Negative for headaches.    Physical Exam Updated Vital Signs BP 132/83   Pulse 90   Temp 97.8 F (36.6 C)  (Oral)   Resp 18   Ht '5\' 2"'$  (1.575 m)   Wt 108.9 kg   LMP 10/16/2022   SpO2 99%   BMI 43.91 kg/m  Physical Exam Vitals and nursing note reviewed.  Constitutional:      General: She is not in acute distress.    Appearance: She is not ill-appearing.  HENT:     Head: Normocephalic and atraumatic.     Nose: No congestion.  Eyes:     Conjunctiva/sclera: Conjunctivae normal.  Cardiovascular:     Rate and Rhythm: Normal rate and regular rhythm.     Pulses: Normal pulses.     Heart sounds: No murmur heard.    No friction rub. No gallop.  Pulmonary:     Effort: No respiratory distress.     Breath sounds: No wheezing, rhonchi or rales.  Abdominal:     Palpations: Abdomen is soft.     Tenderness: There is abdominal tenderness. There is no right CVA tenderness or left CVA tenderness.     Comments: Abdomen nondistended, soft, tenderness in the epigastric region without guarding rebound tenderness or peritoneal sign negative Murphy sign.  He had no flank tenderness.  Skin:    General: Skin is warm and dry.  Neurological:     Mental Status: [name].  Psychiatric:        Mood and Affect: Mood normal.     ED Results / Procedures / Treatments   Labs (all  labs ordered are listed, but only abnormal results are displayed) Labs Reviewed  CBC WITH DIFFERENTIAL/PLATELET - Abnormal; Notable for the following components:      Result Value   RBC 3.74 (*)    Hemoglobin 10.7 (*)    HCT 31.9 (*)    All other components within normal limits  COMPREHENSIVE METABOLIC PANEL - Abnormal; Notable for the following components:   Sodium 133 (*)    Potassium 3.2 (*)    Glucose, Bld 104 (*)    Calcium 8.2 (*)    Albumin 3.3 (*)    Anion gap 4 (*)    All other components within normal limits  URINALYSIS, ROUTINE W REFLEX MICROSCOPIC - Abnormal; Notable for the following components:   APPearance HAZY (*)    Leukocytes,Ua TRACE (*)    Bacteria, UA RARE (*)    All other components within normal  limits  LIPASE, BLOOD    EKG None  Radiology US Abdomen Limited  Result Date: 11/15/2022 CLINICAL DATA:  Right upper quadrant pain EXAM: ULTRASOUND ABDOMEN LIMITED RIGHT UPPER QUADRANT COMPARISON:  11/03/2020 FINDINGS: Gallbladder: Small gallstones within the gallbladder measuring up to 1 cm. No wall thickening or sonographic Murphy sign. Common bile duct: Diameter: Normal caliber, 2 mm Liver: No focal lesion identified. Within normal limits in parenchymal echogenicity. Portal vein is patent on color Doppler imaging with normal direction of blood flow towards the liver. Other: None. IMPRESSION: Cholelithiasis.  No sonographic evidence of acute cholecystitis. Electronically Signed   By: Rolm Baptise M.D.   On: 11/15/2022 01:40    Procedures Procedures    Medications Ordered in ED Medications  alum & mag hydroxide-simeth (MAALOX/MYLANTA) 200-200-20 MG/5ML suspension 30 mL (30 mLs Oral Given 11/15/22 0148)    ED Course/ Medical Decision Making/ A&P                             Medical Decision Making Amount and/or Complexity of Data Reviewed Radiology: ordered.  Risk OTC drugs.   This patient presents to the ED for concern of epigastric pain, this involves an extensive number of treatment options, and is a complaint that carries with it a high risk of complications and morbidity.  The differential diagnosis includes PE, pneumonia, cholecystitis, gastritis    Additional history obtained:  Additional history obtained from N/A External records from outside source obtained and reviewed including recent ER notes   Co morbidities that complicate the patient evaluation  N/A  Social Determinants of Health:  No primary care provider    Lab Tests:  I Ordered, and personally interpreted labs.  The pertinent results include: CBC shows normocytic anemia hemoglobin 10.7, CMP reveals sodium of 133 potassium 3.2 glucose 104, UA reveals trace leukocytes rare bacteria, lipase  27   Imaging Studies ordered:  I ordered imaging studies including limited ultrasound I independently visualized and interpreted imaging which showed negative acute findings I agree with the radiologist interpretation   Cardiac Monitoring:  The patient was maintained on a cardiac monitor.  I personally viewed and interpreted the cardiac monitored which showed an underlying rhythm of: N/A   Medicines ordered and prescription drug management:  I ordered medication including GI cocktail I have reviewed the patients home medicines and have made adjustments as needed  Critical Interventions:  N/a   Reevaluation:  Abdominal pain triage obtain basic lab workup, she had slight epigastric tenderness, will obtain ultrasound for rule out of possible cholecystitis will provide  with a GI cocktail  Ultrasound shows cholelithiasis without acute cholecystitis patient states she feels much better after GI cocktail and is ready for discharge.  Consultations Obtained:  N/a    Test Considered:  CTAP-deferred as my suspicion for intra-abdominal normalities low nonsurgical abdomen nontoxic-appearing no leukocytosis.    Rule out low suspicion for lower lobe pneumonia as lung sounds are clear bilaterally, will defer imaging at this time.  I have low suspicion for liver or gallbladder abnormality a, liver enzymes, alk phos, T bili all within normal limits ultrasound is negative for acute cholecystitis.  Low suspicion for pancreatitis as lipase is within normal limits.  Low suspicion for ruptured stomach ulcer as she has no peritoneal sign present on exam.  Low suspicion for bowel obstruction as abdomen is nondistended normal bowel sounds, so passing gas and having normal bowel movements.  Low suspicion for complicated diverticulitis as she is nontoxic-appearing, vital signs reassuring no leukocytosis present.  Low suspicion for appendicitis as she has no right lower quadrant tenderness, vital  signs reassuring.    Dispostion and problem list  After consideration of the diagnostic results and the patients response to treatment, I feel that the patent would benefit from discharge.  Epigastric pain-this is back to GERD as well as gastritis, will send her home on an acid pill, follow-up with PCP for further evaluation and strict return precautions.            Final Clinical Impression(s) / ED Diagnoses Final diagnoses:  Epigastric pain    Rx / DC Orders ED Discharge Orders          Ordered    omeprazole (PRILOSEC) 20 MG capsule  Daily        11/15/22 0330              Marcello Fennel, PA-C 11/15/22 0331    Ripley Fraise, MD 11/15/22 [phone]

## 2022-11-15 NOTE — Discharge Instructions (Signed)
Lab work imaging was reassuring, I suspect you have inflammation of your stomach as well as some acid reflux of starting on an acid pill please take as prescribed.  Given information on foods that will help decrease acid reflux please review  Follow-up with community health and wellness  Come back to the emergency department if you develop chest pain, shortness of breath, severe abdominal pain, uncontrolled nausea, vomiting, diarrhea.

## 2022-12-04 ENCOUNTER — Emergency Department (HOSPITAL_COMMUNITY): Payer: Self-pay

## 2022-12-04 ENCOUNTER — Emergency Department (HOSPITAL_COMMUNITY)
Admission: EM | Admit: 2022-12-04 | Discharge: 2022-12-04 | Disposition: A | Discharge status: Home / Self Care | Payer: Self-pay | Attending: Student | Admitting: Student

## 2022-12-04 ENCOUNTER — Encounter (HOSPITAL_COMMUNITY): Payer: Self-pay

## 2022-12-04 ENCOUNTER — Other Ambulatory Visit: Payer: Self-pay

## 2022-12-04 DIAGNOSIS — O09899 Supervision of other high risk pregnancies, unspecified trimester: Secondary | ICD-10-CM | POA: Insufficient documentation

## 2022-12-04 DIAGNOSIS — Z3A00 Weeks of gestation of pregnancy not specified: Secondary | ICD-10-CM | POA: Insufficient documentation

## 2022-12-04 DIAGNOSIS — Y92410 Unspecified street and highway as the place of occurrence of the external cause: Secondary | ICD-10-CM | POA: Insufficient documentation

## 2022-12-04 DIAGNOSIS — J45909 Unspecified asthma, uncomplicated: Secondary | ICD-10-CM | POA: Insufficient documentation

## 2022-12-04 DIAGNOSIS — O9A219 Injury, poisoning and certain other consequences of external causes complicating pregnancy, unspecified trimester: Secondary | ICD-10-CM | POA: Diagnosis present

## 2022-12-04 DIAGNOSIS — M545 Low back pain, unspecified: Secondary | ICD-10-CM | POA: Insufficient documentation

## 2022-12-04 DIAGNOSIS — Z3490 Encounter for supervision of normal pregnancy, unspecified, unspecified trimester: Secondary | ICD-10-CM

## 2022-12-04 LAB — CBC WITH DIFFERENTIAL/PLATELET
Abs Immature Granulocytes: 0.01 10*3/uL (ref 0.00–0.07)
Basophils Absolute: 0 10*3/uL (ref 0.0–0.1)
Basophils Relative: 0 %
Eosinophils Absolute: 0.3 10*3/uL (ref 0.0–0.5)
Eosinophils Relative: 4 %
HCT: 32.8 % — ABNORMAL LOW (ref 36.0–46.0)
Hemoglobin: 10.5 g/dL — ABNORMAL LOW (ref 12.0–15.0)
Immature Granulocytes: 0 %
Lymphocytes Relative: 28 %
Lymphs Abs: 1.9 10*3/uL (ref 0.7–4.0)
MCH: 28.6 pg (ref 26.0–34.0)
MCHC: 32 g/dL (ref 30.0–36.0)
MCV: 89.4 fL (ref 80.0–100.0)
Monocytes Absolute: 0.5 10*3/uL (ref 0.1–1.0)
Monocytes Relative: 8 %
Neutro Abs: 4.2 10*3/uL (ref 1.7–7.7)
Neutrophils Relative %: 60 %
Platelets: 362 10*3/uL (ref 150–400)
RBC: 3.67 MIL/uL — ABNORMAL LOW (ref 3.87–5.11)
RDW: 13.1 % (ref 11.5–15.5)
WBC: 6.9 10*3/uL (ref 4.0–10.5)
nRBC: 0 % (ref 0.0–0.2)

## 2022-12-04 LAB — COMPREHENSIVE METABOLIC PANEL
ALT: 17 U/L (ref 0–44)
AST: 16 U/L (ref 15–41)
Albumin: 3.1 g/dL — ABNORMAL LOW (ref 3.5–5.0)
Alkaline Phosphatase: 72 U/L (ref 38–126)
Anion gap: 9 (ref 5–15)
BUN: <5 mg/dL — ABNORMAL LOW (ref 6–20)
CO2: 23 mmol/L (ref 22–32)
Calcium: 8.6 mg/dL — ABNORMAL LOW (ref 8.9–10.3)
Chloride: 104 mmol/L (ref 98–111)
Creatinine, Ser: 0.56 mg/dL (ref 0.44–1.00)
GFR, Estimated: >60 mL/min (ref >60)
Glucose, Bld: 111 mg/dL — ABNORMAL HIGH (ref 70–99)
Potassium: 3.3 mmol/L — ABNORMAL LOW (ref 3.5–5.1)
Sodium: 136 mmol/L (ref 135–145)
Total Bilirubin: 0.2 mg/dL — ABNORMAL LOW (ref 0.3–1.2)
Total Protein: 6.8 g/dL (ref 6.5–8.1)

## 2022-12-04 LAB — HCG, QUANTITATIVE, PREGNANCY: hCG, Beta Chain, Quant, S: 106002 m[IU]/mL — ABNORMAL HIGH (ref <5)

## 2022-12-04 LAB — POC URINE PREG, ED: Preg Test, Ur: POSITIVE — AB

## 2022-12-04 MED ORDER — LIDOCAINE 5 % EX PTCH: 1.0000 | MEDICATED_PATCH | CUTANEOUS | 0 refills | Status: DC

## 2022-12-04 MED ORDER — DICLOFENAC SODIUM 1 % EX GEL: 2.0000 g | Freq: Four times a day (QID) | CUTANEOUS | 0 refills | Status: DC

## 2022-12-04 MED ORDER — ACETAMINOPHEN 500 MG PO TABS: 1000.0000 mg | ORAL_TABLET | Freq: Three times a day (TID) | ORAL | 0 refills | Status: AC

## 2022-12-04 MED ORDER — MAGNESIUM OXIDE -MG SUPPLEMENT 400 (240 MG) MG PO TABS
800.0000 mg | ORAL_TABLET | Freq: Once | ORAL | Status: AC
  Administered 2022-12-04: 800 mg via ORAL
  Filled 2022-12-04: qty 2

## 2022-12-04 MED ORDER — ACETAMINOPHEN 500 MG PO TABS
1000.0000 mg | ORAL_TABLET | Freq: Once | ORAL | Status: AC
  Administered 2022-12-04: 1000 mg via ORAL
  Filled 2022-12-04: qty 2

## 2022-12-04 MED ORDER — LIDOCAINE 5 % EX PTCH
1.0000 | MEDICATED_PATCH | CUTANEOUS | Status: DC
  Administered 2022-12-04: 1 via TRANSDERMAL
  Filled 2022-12-04: qty 1

## 2022-12-04 MED ORDER — POTASSIUM CHLORIDE CRYS ER 20 MEQ PO TBCR
40.0000 meq | EXTENDED_RELEASE_TABLET | Freq: Once | ORAL | Status: AC
  Administered 2022-12-04: 40 meq via ORAL
  Filled 2022-12-04: qty 2
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
## 2022-12-31 ENCOUNTER — Other Ambulatory Visit: Payer: Self-pay

## 2022-12-31 ENCOUNTER — Encounter (HOSPITAL_COMMUNITY): Payer: Self-pay | Admitting: *Deleted

## 2022-12-31 ENCOUNTER — Emergency Department (HOSPITAL_COMMUNITY)
Admission: EM | Admit: 2022-12-31 | Discharge: 2022-12-31 | Disposition: A | Discharge status: Home / Self Care | Payer: Self-pay | Attending: Emergency Medicine | Admitting: Emergency Medicine

## 2022-12-31 DIAGNOSIS — S29011D Strain of muscle and tendon of front wall of thorax, subsequent encounter: Secondary | ICD-10-CM | POA: Insufficient documentation

## 2022-12-31 DIAGNOSIS — S46911D Strain of unspecified muscle, fascia and tendon at shoulder and upper arm level, right arm, subsequent encounter: Secondary | ICD-10-CM | POA: Diagnosis not present

## 2022-12-31 DIAGNOSIS — T148XXA Other injury of unspecified body region, initial encounter: Secondary | ICD-10-CM

## 2022-12-31 DIAGNOSIS — O9A211 Injury, poisoning and certain other consequences of external causes complicating pregnancy, first trimester: Secondary | ICD-10-CM | POA: Insufficient documentation

## 2022-12-31 DIAGNOSIS — Z3A10 10 weeks gestation of pregnancy: Secondary | ICD-10-CM | POA: Insufficient documentation

## 2022-12-31 DIAGNOSIS — O26891 Other specified pregnancy related conditions, first trimester: Secondary | ICD-10-CM | POA: Diagnosis present

## 2022-12-31 MED ORDER — LIDOCAINE 5 % EX PTCH
1.0000 | MEDICATED_PATCH | CUTANEOUS | Status: DC
  Administered 2022-12-31: 1 via TRANSDERMAL
  Filled 2022-12-31: qty 1

## 2022-12-31 MED ORDER — METOCLOPRAMIDE HCL 10 MG PO TABS
10.0000 mg | ORAL_TABLET | Freq: Once | ORAL | Status: AC
  Administered 2022-12-31: 10 mg via ORAL
  Filled 2022-12-31: qty 1

## 2022-12-31 MED ORDER — GABAPENTIN 300 MG PO CAPS: 300.0000 mg | ORAL_CAPSULE | Freq: Three times a day (TID) | ORAL | 0 refills | Status: DC

## 2022-12-31 MED ORDER — LIDOCAINE 5 % EX PTCH: 1.0000 | MEDICATED_PATCH | CUTANEOUS | 0 refills | Status: DC

## 2022-12-31 MED ORDER — CYCLOBENZAPRINE HCL 10 MG PO TABS: 10.0000 mg | ORAL_TABLET | Freq: Two times a day (BID) | ORAL | 0 refills | Status: DC | PRN

## 2022-12-31 MED ORDER — IBUPROFEN 400 MG PO TABS
600.0000 mg | ORAL_TABLET | Freq: Once | ORAL | Status: AC
  Administered 2022-12-31: 600 mg via ORAL
  Filled 2022-12-31: qty 1
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
## 2022-12-31 NOTE — Discharge Instructions (Signed)
We recommend gabapentin and Flexeril as prescribed for management of pain.  Continue use of Lidoderm patches.  Follow-up with your OB/GYN as scheduled for prenatal care.  Return for new or concerning symptoms.
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
## 2023-02-19 ENCOUNTER — Emergency Department (HOSPITAL_COMMUNITY)
Admission: EM | Admit: 2023-02-19 | Discharge: 2023-02-20 | Disposition: A | Discharge status: Home / Self Care | Payer: Self-pay | Attending: Emergency Medicine | Admitting: Emergency Medicine

## 2023-02-19 DIAGNOSIS — Z7951 Long term (current) use of inhaled steroids: Secondary | ICD-10-CM | POA: Insufficient documentation

## 2023-02-19 DIAGNOSIS — Z1152 Encounter for screening for COVID-19: Secondary | ICD-10-CM | POA: Insufficient documentation

## 2023-02-19 DIAGNOSIS — Z3A00 Weeks of gestation of pregnancy not specified: Secondary | ICD-10-CM | POA: Insufficient documentation

## 2023-02-19 DIAGNOSIS — O26899 Other specified pregnancy related conditions, unspecified trimester: Secondary | ICD-10-CM | POA: Insufficient documentation

## 2023-02-19 DIAGNOSIS — E871 Hypo-osmolality and hyponatremia: Secondary | ICD-10-CM | POA: Insufficient documentation

## 2023-02-19 DIAGNOSIS — J029 Acute pharyngitis, unspecified: Secondary | ICD-10-CM

## 2023-02-19 DIAGNOSIS — R052 Subacute cough: Secondary | ICD-10-CM

## 2023-02-19 DIAGNOSIS — J45909 Unspecified asthma, uncomplicated: Secondary | ICD-10-CM | POA: Insufficient documentation

## 2023-02-20 ENCOUNTER — Other Ambulatory Visit: Payer: Self-pay

## 2023-02-20 ENCOUNTER — Encounter (HOSPITAL_COMMUNITY): Payer: Self-pay

## 2023-02-20 LAB — COMPREHENSIVE METABOLIC PANEL
ALT: 19 U/L (ref 0–44)
AST: 18 U/L (ref 15–41)
Albumin: 2.8 g/dL — ABNORMAL LOW (ref 3.5–5.0)
Alkaline Phosphatase: 78 U/L (ref 38–126)
Anion gap: 10 (ref 5–15)
BUN: <5 mg/dL — ABNORMAL LOW (ref 6–20)
CO2: 18 mmol/L — ABNORMAL LOW (ref 22–32)
Calcium: 8.2 mg/dL — ABNORMAL LOW (ref 8.9–10.3)
Chloride: 106 mmol/L (ref 98–111)
Creatinine, Ser: 0.42 mg/dL — ABNORMAL LOW (ref 0.44–1.00)
GFR, Estimated: >60 mL/min (ref >60)
Glucose, Bld: 119 mg/dL — ABNORMAL HIGH (ref 70–99)
Potassium: 3.5 mmol/L (ref 3.5–5.1)
Sodium: 134 mmol/L — ABNORMAL LOW (ref 135–145)
Total Bilirubin: 0.2 mg/dL — ABNORMAL LOW (ref 0.3–1.2)
Total Protein: 6.4 g/dL — ABNORMAL LOW (ref 6.5–8.1)

## 2023-02-20 LAB — CBC WITH DIFFERENTIAL/PLATELET
Abs Immature Granulocytes: 0.06 10*3/uL (ref 0.00–0.07)
Basophils Absolute: 0 10*3/uL (ref 0.0–0.1)
Basophils Relative: 0 %
Eosinophils Absolute: 0.3 10*3/uL (ref 0.0–0.5)
Eosinophils Relative: 3 %
HCT: 27.7 % — ABNORMAL LOW (ref 36.0–46.0)
Hemoglobin: 9.4 g/dL — ABNORMAL LOW (ref 12.0–15.0)
Immature Granulocytes: 1 %
Lymphocytes Relative: 27 %
Lymphs Abs: 2.4 10*3/uL (ref 0.7–4.0)
MCH: 29.3 pg (ref 26.0–34.0)
MCHC: 33.9 g/dL (ref 30.0–36.0)
MCV: 86.3 fL (ref 80.0–100.0)
Monocytes Absolute: 0.6 10*3/uL (ref 0.1–1.0)
Monocytes Relative: 7 %
Neutro Abs: 5.3 10*3/uL (ref 1.7–7.7)
Neutrophils Relative %: 62 %
Platelets: 336 10*3/uL (ref 150–400)
RBC: 3.21 MIL/uL — ABNORMAL LOW (ref 3.87–5.11)
RDW: 12.8 % (ref 11.5–15.5)
WBC: 8.6 10*3/uL (ref 4.0–10.5)
nRBC: 0 % (ref 0.0–0.2)

## 2023-02-20 LAB — RESP PANEL BY RT-PCR (RSV, FLU A&B, COVID)  RVPGX2
Influenza A by PCR: NEGATIVE
Influenza B by PCR: NEGATIVE
Resp Syncytial Virus by PCR: NEGATIVE
SARS Coronavirus 2 by RT PCR: NEGATIVE

## 2023-02-20 LAB — HCG, QUANTITATIVE, PREGNANCY: hCG, Beta Chain, Quant, S: 19500 m[IU]/mL — ABNORMAL HIGH (ref <5)

## 2023-02-20 MED ORDER — AMOXICILLIN 500 MG PO CAPS: 500.0000 mg | ORAL_CAPSULE | Freq: Three times a day (TID) | ORAL | 0 refills | Status: DC

## 2023-02-20 MED ORDER — ALBUTEROL SULFATE HFA 108 (90 BASE) MCG/ACT IN AERS: 1.0000 | INHALATION_SPRAY | Freq: Four times a day (QID) | RESPIRATORY_TRACT | 0 refills | Status: AC | PRN

## 2023-02-20 MED ORDER — ALBUTEROL SULFATE HFA 108 (90 BASE) MCG/ACT IN AERS: 1.0000 | INHALATION_SPRAY | Freq: Four times a day (QID) | RESPIRATORY_TRACT | Status: DC | PRN

## 2023-02-20 NOTE — ED Triage Notes (Signed)
Patient reports productive cough /chest congestion , fatigue and fever onset last week , she stated that she is pregnant but unsure of AOG .

## 2023-02-20 NOTE — Discharge Instructions (Signed)
As discussed, use albuterol inhaler as needed for feelings of shortness of breath/wheeze.  Given evidence of clinical findings concerning for pneumonia, will place on antibiotics in the form amoxicillin to take 3 times daily for the next 5 days.  Recommend follow-up with primary care for reassessment of your symptoms.  Recommend Tylenol at home for fever/pain.  Please do not hesitate to return to emergency department for worrisome signs and symptoms we discussed become apparent.

## 2023-02-20 NOTE — ED Provider Notes (Signed)
[name]   CSN: [id number] Arrival date & time: 02/19/23  2352     History  Chief Complaint  Patient presents with   Cough [name]     Pregnant    [name] is a 42 y.o. female.  HPI   42 year old female presents emergency department with complaints of cough, sore throat, intermittent fever since Tuesday of last week.  States that she was with her son in the emergency department for your infection and noted symptoms since onset.  Reported fever on Tuesday and Wednesday of last week but none since then.  States that she has had persistent cough since then with generalized fatigue.  States that she is pregnant but does not know the end of her last menstrual period or how far along in the pregnancy she currently is; states she has upcoming appointment with OB/GYN on the 19th for further assessment of current pregnancy.  Denies chest pain, abdominal pain, nausea, vomiting, urinary symptoms, change in bowel habits.  Past medical history significant for asthma, GERD, arthritis, obesity  Home Medications Prior to Admission medications   Medication Sig Start Date End Date Taking? Authorizing Provider  albuterol (VENTOLIN HFA) 108 (90 Base) MCG/ACT inhaler Inhale 1-2 puffs into the lungs every 6 (six) hours as needed for wheezing or shortness of breath. 02/20/23  Yes Sherian Maroon A, PA  amoxicillin (AMOXIL) 500 MG capsule Take 1 capsule (500 mg total) by mouth 3 (three) times daily. 02/20/23  Yes Sherian Maroon A, PA  coconut oil OIL Apply 1 application topically as needed (nipple pain). 11/08/20   Sheila Oats, MD  cyclobenzaprine (FLEXERIL) 10 MG tablet Take 1 tablet (10 mg total) by mouth 2 (two) times daily as needed for muscle spasms. 12/31/22   Antony Madura, PA-C  gabapentin (NEURONTIN) 300 MG capsule Take 1 capsule (300 mg total) by mouth 3 (three) times daily. 12/31/22   Antony Madura, PA-C  lidocaine  (LIDODERM) 5 % Place 1 patch onto the skin daily. Remove & Discard patch within 12 hours or as directed by MD 12/31/22   Antony Madura, PA-C  omeprazole (PRILOSEC) 20 MG capsule Take 1 capsule (20 mg total) by mouth daily. 11/15/22 12/15/22  Carroll Sage, PA-C      Allergies    Hydrocodone-acetaminophen    Review of Systems   Review of Systems  All other systems reviewed and are negative.   Physical Exam Updated Vital Signs BP (!) 102/50 (BP Location: Right Arm)   Pulse 79   Temp 98.1 F (36.7 C) (Oral)   Resp 18   Ht 5\' 2"  (1.575 m)   Wt 108.9 kg   LMP 10/16/2022   SpO2 100%   BMI 43.91 kg/m  Physical Exam Vitals and nursing note reviewed.  Constitutional:      General: She is not in acute distress.    Appearance: She is well-developed.  HENT:     Head: Normocephalic and atraumatic.     Mouth/Throat:     Comments: Mild posterior pharyngeal erythema.  Tonsils 0-1+ bilaterally with no obvious exudate.  No sublingual or submandibular swelling appreciated. Eyes:     Conjunctiva/sclera: Conjunctivae normal.  Cardiovascular:     Rate and Rhythm: Normal rate and regular rhythm.     Heart sounds: No murmur heard. Pulmonary:     Effort: Pulmonary effort is normal. No respiratory distress.     Breath sounds: Rales present.     Comments:  Faint Rales auscultated right lower lung field Abdominal:     Palpations: Abdomen is soft.     Tenderness: There is no abdominal tenderness.  Musculoskeletal:        General: No swelling.     Cervical back: Neck supple.     Right lower leg: No edema.     Left lower leg: No edema.  Skin:    General: Skin is warm and dry.     Capillary Refill: Capillary refill takes less than 2 seconds.  Neurological:     Mental Status: [name].  Psychiatric:        Mood and Affect: Mood normal.     ED Results / Procedures / Treatments   Labs (all labs ordered are listed, but only abnormal results are displayed) Labs Reviewed  HCG,  QUANTITATIVE, PREGNANCY - Abnormal; Notable for the following components:      Result Value   hCG, Beta Chain, Quant, S 19,500 (*)    All other components within normal limits  CBC WITH DIFFERENTIAL/PLATELET - Abnormal; Notable for the following components:   RBC 3.21 (*)    Hemoglobin 9.4 (*)    HCT 27.7 (*)    All other components within normal limits  COMPREHENSIVE METABOLIC PANEL - Abnormal; Notable for the following components:   Sodium 134 (*)    CO2 18 (*)    Glucose, Bld 119 (*)    BUN <5 (*)    Creatinine, Ser 0.42 (*)    Calcium 8.2 (*)    Total Protein 6.4 (*)    Albumin 2.8 (*)    Total Bilirubin 0.2 (*)    All other components within normal limits  RESP PANEL BY RT-PCR (RSV, FLU A&B, COVID)  RVPGX2    EKG None  Radiology No results found.  Procedures Procedures    Medications Ordered in ED Medications  albuterol (VENTOLIN HFA) 108 (90 Base) MCG/ACT inhaler 1-2 puff (has no administration in time range)    ED Course/ Medical Decision Making/ A&P                             Medical Decision Making Amount and/or Complexity of Data Reviewed Labs: ordered.  Risk Prescription drug management.   This patient presents to the ED for concern of cough, this involves an extensive number of treatment options, and is a complaint that carries with it a high risk of complications and morbidity.  The differential diagnosis includes as, COPD, polyp, COVID, influenza, RSV, pneumonia, sepsis, malignancy   Co morbidities that complicate the patient evaluation  See HPI   Additional history obtained:  Additional history obtained from EMR External records from outside source obtained and reviewed including hospital records   Lab Tests:  I Ordered, and personally interpreted labs.  The pertinent results include: No leukocytosis.  Patient with evidence anemia with hemoglobin 9.4 which is near patient's baseline from prior laboratory studies performed.  Placed  within normal range.  Mild hyponatremia as well as decrease in bicarb of 134 and 18 respectively.  No transaminitis.  No renal dysfunction.  Beta-hCG elevated at 19,500.  Rest provide opponent negative.   Imaging Studies ordered:  N/a   Cardiac Monitoring: / EKG:  The patient was maintained on a cardiac monitor.  I personally viewed and interpreted the cardiac monitored which showed an underlying rhythm of: Sinus rhythm   Consultations Obtained:  N/a   Problem List / ED Course /  Critical interventions / Medication management  Cough, sore throat I ordered medication including albuterol for albuterol Reevaluation of the patient after these medicines showed that the patient stayed the same I have reviewed the patients home medicines and have made adjustments as needed   Social Determinants of Health:  Denies tobacco, illicit drug use   Test / Admission - Considered:  Cough, sore throat Vitals signs within normal range and stable throughout visit. Laboratory studies significant for: See above 42 year old female presents emergency department with complaints of cough, sore throat for the past 7 to 8 days.  Patient with clinical evidence of pneumonia with auscultation of Rales in right lower lung field.  Will treat empirically for pneumonia given duration of patient's symptoms with antibiotics safe in pregnancy.  Will recommend continued use of Tylenol at home if development of fever recurs.  Patient with history of asthma of which she states she feels like she is outgrown will prescribe albuterol inhaler to see if part is cough variant asthma.  Close follow-up with primary care recommended for reevaluation of symptoms.  Patient overall well-appearing, afebrile in no acute respiratory distress.  Treatment plan discussed at length with patient and she acknowledged understanding was agreeable to said plan. Worrisome signs and symptoms were discussed with the patient, and the patient  acknowledged understanding to return to the ED if noticed. Patient was stable upon discharge.          Final Clinical Impression(s) / ED Diagnoses Final diagnoses:  Subacute cough  Sore throat    Rx / DC Orders ED Discharge Orders          Ordered    amoxicillin (AMOXIL) 500 MG capsule  3 times daily        02/20/23 0438    albuterol (VENTOLIN HFA) 108 (90 Base) MCG/ACT inhaler  Every 6 hours PRN        02/20/23 0448              Peter Garter, PA 02/20/23 0450    Gloris Manchester, MD 02/20/23 [phone]

## 2023-02-27 ENCOUNTER — Other Ambulatory Visit (HOSPITAL_COMMUNITY)
Admission: RE | Admit: 2023-02-27 | Discharge: 2023-02-27 | Disposition: A | Discharge status: Home / Self Care | Payer: Self-pay | Source: Ambulatory Visit | Attending: Family Medicine | Admitting: Family Medicine

## 2023-02-27 ENCOUNTER — Telehealth (INDEPENDENT_AMBULATORY_CARE_PROVIDER_SITE_OTHER): Payer: Self-pay

## 2023-02-27 VITALS — BP 99/61 | HR 90 | Wt 225.8 lb

## 2023-02-27 DIAGNOSIS — O0990 Supervision of high risk pregnancy, unspecified, unspecified trimester: Secondary | ICD-10-CM | POA: Insufficient documentation

## 2023-02-27 DIAGNOSIS — Z3689 Encounter for other specified antenatal screening: Secondary | ICD-10-CM

## 2023-02-27 NOTE — Progress Notes (Signed)
New OB Intake  I connected with [name]  on 02/27/23 at 11:15 AM EDT in person and verified that I am speaking with the correct person using two identifiers. Nurse is located at [facility] and pt is located at [employer].  I discussed the limitations, risks, security and privacy concerns of performing an evaluation and management service by telephone and the availability of in person appointments. I also discussed with the patient that there may be a patient responsible charge related to this service. The patient expressed understanding and agreed to proceed.  I explained I am completing New OB Intake today. We discussed EDD of 07/23/2023, by Last Menstrual Period. Pt is [id number]. I reviewed her allergies, medications and Medical/Surgical/OB history.    Patient Active Problem List   Diagnosis Date Noted   Unstable lie of fetus 11/03/2020   Cholecystitis, acute 11/03/2020   Cholestasis of pregnancy 10/14/2020   Supervision of high risk pregnancy, antepartum 09/27/2020   Maternal obesity affecting pregnancy, antepartum 09/27/2020   Gestational diabetes mellitus (GDM) affecting pregnancy 09/27/2020   Cholelithiasis 04/08/2020   Vaginal delivery 12/27/2018   AMA (advanced maternal age) multigravida 35+ 12/26/2018   Language barrier 11/21/2018   ASCUS of cervix with negative high risk HPV 10/20/2018   PTSD (post-traumatic stress disorder) 05/29/2013   Depression with suicidal ideation 05/29/2013   Suicide attempt by multiple drug overdose (HCC) 05/29/2013   Chronic pain following surgery or procedure 05/29/2013   HNP (herniated nucleus pulposus), lumbar 02/04/2013    Concerns addressed today  Delivery Plans Plans to deliver at [facility]. Discussed the nature of our practice with multiple providers including residents and students. Due to the size of the practice, the delivering provider may not be the same as those providing prenatal care.   Patient is not interested in water  birth. Offered upcoming OB visit with CNM to discuss further.  MyChart/Babyscripts MyChart access verified. I explained pt will have some visits in office and some virtually. Babyscripts instructions given and order placed. Patient verifies receipt of registration text/e-mail. Account successfully created and app downloaded.  Blood Pressure Cuff/Weight Scale Patient is self-pay; explained patient will be given BP cuff at first prenatal appt. Explained after first prenatal appt pt will check weekly and document in Babyscripts.   Anatomy US Explained first scheduled [place] will be around 19 weeks. Anatomy US scheduled for 03/01/2023 at 10:30.    Is patient a [facility] candidate?  Not a candidate   If accepted, confirm patient does not intend to move from the area for at least 12 months, then notify Mom+Baby staff  Interested in [employer]? If yes, send referral and doula dot phrase.    First visit review I reviewed new OB appt with patient. Explained pt will be seen by Dr. Vergie Living at first visit. Discussed Avelina Laine genetic screening with patient. [employer].. Routine prenatal labs is collected today during new ob intake.  Last Pap No results found for: "DIAGPAP"  Lowry Bowl, CMA 02/27/2023  11:17 AM

## 2023-02-27 NOTE — Patient Instructions (Signed)

## 2023-02-28 ENCOUNTER — Encounter: Payer: Self-pay | Admitting: General Practice

## 2023-02-28 LAB — GC/CHLAMYDIA PROBE AMP (~~LOC~~) NOT AT ARMC
Chlamydia: NEGATIVE
Comment: NEGATIVE
Comment: NORMAL
Neisseria Gonorrhea: NEGATIVE

## 2023-03-01 LAB — CBC/D/PLT+RPR+RH+ABO+RUBIGG...
Antibody Screen: NEGATIVE
Basophils Absolute: 0 10*3/uL (ref 0.0–0.2)
Basos: 0 %
EOS (ABSOLUTE): 0.3 10*3/uL (ref 0.0–0.4)
Eos: 4 %
HCV Ab: NONREACTIVE
HIV Screen 4th Generation wRfx: NONREACTIVE
Hematocrit: 30.2 % — ABNORMAL LOW (ref 34.0–46.6)
Hemoglobin: 9.7 g/dL — ABNORMAL LOW (ref 11.1–15.9)
Hepatitis B Surface Ag: NEGATIVE
Immature Grans (Abs): 0.1 10*3/uL (ref 0.0–0.1)
Immature Granulocytes: 1 %
Lymphocytes Absolute: 1.7 10*3/uL (ref 0.7–3.1)
Lymphs: 22 %
MCH: 28.8 pg (ref 26.6–33.0)
MCHC: 32.1 g/dL (ref 31.5–35.7)
MCV: 90 fL (ref 79–97)
Monocytes Absolute: 0.5 10*3/uL (ref 0.1–0.9)
Monocytes: 6 %
Neutrophils Absolute: 5.2 10*3/uL (ref 1.4–7.0)
Neutrophils: 67 %
Platelets: 328 10*3/uL (ref 150–450)
RBC: 3.37 x10E6/uL — ABNORMAL LOW (ref 3.77–5.28)
RDW: 12.9 % (ref 11.7–15.4)
RPR Ser Ql: NONREACTIVE
Rh Factor: POSITIVE
Rubella Antibodies, IGG: >33 index (ref >0.99)
WBC: 7.8 10*3/uL (ref 3.4–10.8)

## 2023-03-01 LAB — HCV INTERPRETATION

## 2023-03-01 LAB — AFP, SERUM, OPEN SPINA BIFIDA
AFP MoM: 1
AFP Value: 40.4 ng/mL
Gest. Age on Collection Date: 19.1 weeks
Maternal Age At EDD: 42.2 yr
OSBR Risk 1 IN: 10000
Test Results:: NEGATIVE
Weight: 225 [lb_av]

## 2023-03-01 LAB — HEMOGLOBIN A1C
Est. average glucose Bld gHb Est-mCnc: 126 mg/dL
Hgb A1c MFr Bld: 6 % — ABNORMAL HIGH (ref 4.8–5.6)

## 2023-03-01 LAB — CULTURE, OB URINE

## 2023-03-01 LAB — URINE CULTURE, OB REFLEX

## 2023-03-04 ENCOUNTER — Telehealth: Payer: Self-pay

## 2023-03-04 ENCOUNTER — Encounter: Payer: Self-pay | Admitting: Obstetrics and Gynecology

## 2023-03-04 ENCOUNTER — Other Ambulatory Visit: Payer: Self-pay

## 2023-03-04 DIAGNOSIS — O99012 Anemia complicating pregnancy, second trimester: Secondary | ICD-10-CM | POA: Insufficient documentation

## 2023-03-04 DIAGNOSIS — O99013 Anemia complicating pregnancy, third trimester: Secondary | ICD-10-CM | POA: Insufficient documentation

## 2023-03-04 DIAGNOSIS — Z8632 Personal history of gestational diabetes: Secondary | ICD-10-CM | POA: Insufficient documentation

## 2023-03-04 DIAGNOSIS — R7309 Other abnormal glucose: Secondary | ICD-10-CM

## 2023-03-04 DIAGNOSIS — Z6841 Body Mass Index (BMI) 40.0 and over, adult: Secondary | ICD-10-CM | POA: Insufficient documentation

## 2023-03-04 DIAGNOSIS — O0990 Supervision of high risk pregnancy, unspecified, unspecified trimester: Secondary | ICD-10-CM

## 2023-03-04 DIAGNOSIS — Z641 Problems related to multiparity: Secondary | ICD-10-CM | POA: Insufficient documentation

## 2023-03-04 DIAGNOSIS — O24419 Gestational diabetes mellitus in pregnancy, unspecified control: Secondary | ICD-10-CM | POA: Insufficient documentation

## 2023-03-04 DIAGNOSIS — O99810 Abnormal glucose complicating pregnancy: Secondary | ICD-10-CM | POA: Insufficient documentation

## 2023-03-04 NOTE — Telephone Encounter (Addendum)
-----   Message from Graham Bing, MD sent at 03/04/2023  8:19 AM EDT ----- She needs an early 2h, fasting GTT. Can we arrange this for when she comes in for her new OB in a couple of days? Thanks  Pt notified with [employer] and informed pt provider's recommendation.  Pt explained that the provider recommends early glucose test and her on NEW OB appt on 03/07/23 if she could in on 03/07/23 at 0800 for testing and provider.  Pt agreed with no further questions.   Leonette Nutting

## 2023-03-07 ENCOUNTER — Telehealth: Payer: Self-pay

## 2023-03-07 ENCOUNTER — Other Ambulatory Visit: Payer: Self-pay

## 2023-03-07 ENCOUNTER — Encounter: Payer: Self-pay | Admitting: Obstetrics and Gynecology

## 2023-03-07 ENCOUNTER — Ambulatory Visit (INDEPENDENT_AMBULATORY_CARE_PROVIDER_SITE_OTHER): Payer: Self-pay | Admitting: Obstetrics and Gynecology

## 2023-03-07 VITALS — BP 110/75 | HR 83 | Wt 227.3 lb

## 2023-03-07 DIAGNOSIS — Z6841 Body Mass Index (BMI) 40.0 and over, adult: Secondary | ICD-10-CM

## 2023-03-07 DIAGNOSIS — O0932 Supervision of pregnancy with insufficient antenatal care, second trimester: Secondary | ICD-10-CM | POA: Insufficient documentation

## 2023-03-07 DIAGNOSIS — Z603 Acculturation difficulty: Secondary | ICD-10-CM

## 2023-03-07 DIAGNOSIS — Z3A20 20 weeks gestation of pregnancy: Secondary | ICD-10-CM

## 2023-03-07 DIAGNOSIS — O99012 Anemia complicating pregnancy, second trimester: Secondary | ICD-10-CM

## 2023-03-07 DIAGNOSIS — O0990 Supervision of high risk pregnancy, unspecified, unspecified trimester: Secondary | ICD-10-CM

## 2023-03-07 DIAGNOSIS — Z758 Other problems related to medical facilities and other health care: Secondary | ICD-10-CM

## 2023-03-07 DIAGNOSIS — O99810 Abnormal glucose complicating pregnancy: Secondary | ICD-10-CM

## 2023-03-07 DIAGNOSIS — R7309 Other abnormal glucose: Secondary | ICD-10-CM

## 2023-03-07 DIAGNOSIS — O99210 Obesity complicating pregnancy, unspecified trimester: Secondary | ICD-10-CM

## 2023-03-07 DIAGNOSIS — O09522 Supervision of elderly multigravida, second trimester: Secondary | ICD-10-CM

## 2023-03-07 LAB — HORIZON CUSTOM: REPORT SUMMARY: NEGATIVE

## 2023-03-07 MED ORDER — ASPIRIN 81 MG PO TBEC: 81.0000 mg | DELAYED_RELEASE_TABLET | Freq: Every day | ORAL | 1 refills | Status: DC

## 2023-03-07 MED ORDER — FERROUS SULFATE 324 MG PO TBEC: 324.0000 mg | DELAYED_RELEASE_TABLET | ORAL | 0 refills | Status: AC

## 2023-03-07 NOTE — Telephone Encounter (Signed)
Called Pt using [employer] id# [phone] to advise of [place] on 03/08/23 @ 11a.No answer, left VM.

## 2023-03-07 NOTE — Progress Notes (Signed)
New OB Note  03/07/2023   Clinic: [facility]  Chief Complaint: new OB  Transfer of Care Patient: no  History of Present Illness: Ms. [name] is a 42 y.o. [id number] at 202/ weeks [facility] 11/12 [tentative], based on Patient's last menstrual period was 10/16/2022.).  Preg complicated by has Obesity in pregnancy; Language barrier; AMA (advanced maternal age) multigravida 35+, second trimester; Supervision of high risk pregnancy, antepartum; Abnormal glucose affecting pregnancy; Anemia in pregnancy, second trimester; BMI 40.0-44.9, adult (HCC); Grand multiparity; and Late prenatal care affecting pregnancy in second trimester on their problem list.   No OB s/s or issues today.   ROS: A 12-point review of systems was performed and negative, except as stated in the above HPI.  OBGYN History: As per HPI. OB History  Gravida Para Term Preterm AB Living  10 5 5  0 3 6  SAB IAB Ectopic Multiple Live Births  0 2 1 0 6    # Outcome Date GA Lbr Len/2nd Weight Sex Delivery Anes PTL Lv  10 Current           9 Term 12/27/18 [redacted]w[redacted]d 08:45 / 00:19 8 lb 12.4 oz (3.98 kg) M Vag-Spont EPI  LIV  8 Ectopic 11/2017 [redacted]w[redacted]d         7 IAB 2016 [redacted]w[redacted]d         6 IAB 2011 [redacted]w[redacted]d         5 Term 01/08/09 [redacted]w[redacted]d  7 lb (3.175 kg) F Vag-Spont EPI N LIV  4 Term 04/23/07 [redacted]w[redacted]d  7 lb (3.175 kg) M Vag-Spont EPI  LIV  3 Term 04/10/02 [redacted]w[redacted]d  7 lb (3.175 kg) F Vag-Spont EPI  LIV  2 Term 03/30/98 [redacted]w[redacted]d  8 lb (3.629 kg) M Vag-Spont EPI N LIV  1 Gravida            History of pap smears: Yes. Last pap smear 07/2020 and results were negative   Past Medical History: Past Medical History:  Diagnosis Date   AMA (advanced maternal age) multigravida 35+ 12/26/2018   Anxiety    relative to circumstances to back problems/injury, not taking any medicine, pt. reports has been crying a lot   Arthritis    low back   ASCUS of cervix with negative high risk HPV 10/20/2018   Asthma    Back pain     Cholelithiasis 04/08/2020   Chronic pain following surgery or procedure 05/29/2013   Depression    Depression with suicidal ideation 05/29/2013   GERD (gastroesophageal reflux disease)    HNP (herniated nucleus pulposus), lumbar 02/04/2013   Maternal obesity affecting pregnancy, antepartum 09/27/2020   Migraines    Neck pain    PTSD (post-traumatic stress disorder) 05/29/2013   Suicide attempt by multiple drug overdose (HCC) 05/29/2013   Supervision of high risk pregnancy, antepartum 09/27/2020          Nursing Staff  Provider  Office Location   [facility] ([facility] from [facility])  Dating      Language   Spanish  Anatomy US   normal  Flu Vaccine   09/27/2020  Genetic Screen   NIPS: low risks  AFP:   First Screen:  Quad:      TDaP Vaccine    declines  Hgb A1C or   GTT  Early   Third trimester   COVID Vaccine  1st dose Nov 2021     LAB RESULTS   Rhogam      Blood Type  O pos  Feeding Plan  Breast   Antibody     Unstable lie of fetus 11/03/2020   Vaginal delivery 12/27/2018    Past Surgical History: Past Surgical History:  Procedure Laterality Date   childbirth     x4 vaginal    LUMBAR LAMINECTOMY/DECOMPRESSION MICRODISCECTOMY  06/05/2012   Procedure: LUMBAR LAMINECTOMY/DECOMPRESSION MICRODISCECTOMY;  Surgeon: Emilee Hero, MD;  Location: [facility] OR;  Service: Orthopedics;  Laterality: Left;  Left sided lumbar 4-5 microdisectomy    Family History:  Family History  Problem Relation Age of Onset   Asthma Brother     Social History:  Social History   Socioeconomic History   Marital status: Single    Spouse name: Not on file   Number of children: Not on file   Years of education: Not on file   Highest education level: Not on file  Occupational History   Not on file  Tobacco Use   Smoking status: Never   Smokeless tobacco: Never  Vaping Use   Vaping Use: Never used  Substance and Sexual Activity   Alcohol use: No   Drug use: No   Sexual activity: Yes    Birth control/protection: None   Other Topics Concern   Not on file  Social History Narrative   Not on file   Social Determinants of Health   Financial Resource Strain: Not on file  Food Insecurity: Food Insecurity Present (11/28/2020)   Hunger Vital Sign    Worried About Running Out of Food in the Last Year: Sometimes true    Ran Out of Food in the Last Year: Often true  Transportation Needs: Unmet Transportation Needs (11/28/2020)   PRAPARE - Administrator, Civil Service (Medical): Yes    Lack of Transportation (Non-Medical): Yes  Physical Activity: Not on file  Stress: Not on file  Social Connections: Not on file  Intimate Partner Violence: Not on file    Allergy: Allergies  Allergen Reactions   Hydrocodone-Acetaminophen Itching    Has some itching on face & nose but not bad enough to stop taking it/ MD aware and instructed patient to take anti-allergy medication prn.   Current Outpatient Medications: Prenatal vitamin   Physical Exam:   BP 110/75   Pulse 83   Wt 227 lb 4.8 oz (103.1 kg)   LMP 10/16/2022   BMI 41.57 kg/m  Body mass index is 41.57 kg/m. Contractions: Not present Vag. Bleeding: None. Fundal height: 20 FHTs: 130s  General appearance: Well nourished, well developed female in no acute distress.  Neck:  Supple, normal appearance, and no thyromegaly  Cardiovascular: S1, S2 normal, no murmur, rub or gallop, regular rate and rhythm Respiratory:  Clear to auscultation bilateral. Normal respiratory effort Abdomen: positive bowel sounds and no masses, hernias; diffusely non tender to palpation, non distended Breasts: no s/s. Neuro/Psych:  Normal mood and affect.  Skin:  Warm and dry.  Lymphatic:  No inguinal lymphadenopathy.   Pelvic exam: is not limited by body habitus EGBUS: within normal limits, Vagina: within normal limits and with no blood in the vault, Cervix: normal appearing cervix without discharge or lesions, closed/long/high, Uterus:  enlarged, c/w 20 week size,  and Adnexa:  normal adnexa and no mass, fullness, tenderness  Laboratory: none  Imaging:  none  Assessment: patient stable  Plan: 1. [redacted] weeks gestation of pregnancy Routine care, adopt a mom patient. [facility] anatomy u/s to be scheduled by CMA; confirm dates after that. Will need pap PP. Patient  amenable to low dose ASA Patient desires BTL. I d/w her re: self pay and message sent to JB and DB about calling patient about this.   2. AMA (advanced maternal age) multigravida 35+, second trimester Panorama still in process  3. Late prenatal care affecting pregnancy in second trimester  4. BMI 40.0-44.9, adult (HCC) Weight stable  5. Obesity in pregnancy  6. Abnormal glucose affecting pregnancy Pt getting early 2h today for elevated early a1c  7. Anemia in pregnancy, second trimester Every other day iron d/w her. F/u rpt with 28wk labs    Latest Ref Rng & Units 02/27/2023    1:29 PM 02/20/2023   12:07 AM 12/04/2022   12:09 PM  CBC  WBC 3.4 - 10.8 x10E3/uL 7.8  8.6  6.9   Hemoglobin 11.1 - 15.9 g/dL 9.7  9.4  62.8   Hematocrit 34.0 - 46.6 % 30.2  27.7  32.8   Platelets 150 - 450 x10E3/uL 328  336  362    8. Supervision of high risk pregnancy, antepartum  9. Language barrier In person interpreter used.   Problem list reviewed and updated.  Follow up in 3-4 weeks.  >50% of 40 min visit spent on counseling and coordination of care.     Cornelia Copa MD Attending Center for [facility])

## 2023-03-08 LAB — GLUCOSE TOLERANCE, 2 HOURS W/ 1HR
Glucose, 1 hour: 208 mg/dL — ABNORMAL HIGH (ref 70–179)
Glucose, 2 hour: 104 mg/dL (ref 70–152)
Glucose, Fasting: 111 mg/dL — ABNORMAL HIGH (ref 70–91)

## 2023-03-08 LAB — PANORAMA PRENATAL TEST FULL PANEL:PANORAMA TEST PLUS 5 ADDITIONAL MICRODELETIONS: FETAL FRACTION: 7.2

## 2023-03-09 ENCOUNTER — Encounter: Payer: Self-pay | Admitting: Obstetrics and Gynecology

## 2023-03-11 ENCOUNTER — Telehealth: Payer: Self-pay | Admitting: *Deleted

## 2023-03-11 DIAGNOSIS — O24419 Gestational diabetes mellitus in pregnancy, unspecified control: Secondary | ICD-10-CM

## 2023-03-11 NOTE — Telephone Encounter (Addendum)
-----   Message from Aguila Bing, MD sent at 03/09/2023 11:33 AM EDT ----- She has GDM. Please set her up with the usual classes, supplies, etc. Thanks  7/1  1305  Called pt w/interpreter Debarah Crape and informed her of test results. Pt states she has had GDM with previous pregnancy. I informed her of Diabetes Education appointment on 7/11 @ 9:15. Pt does not have insurance and will receive testing supplies from this office.  Pt voiced understanding of all information provided.

## 2023-03-12 ENCOUNTER — Encounter: Payer: Self-pay | Admitting: General Practice

## 2023-03-20 NOTE — Progress Notes (Unsigned)
Patient was seen for Gestational Diabetes (at 20 weeks) self-management on 03/21/23 Start time 4098 and End time 1015   Estimated due date: 07/23/2023; [redacted]w[redacted]d  Next Ob visit 04/10/2023 with Edd [facility] at [facility]  [employer] video Interpreter Claudia # [phone]  Clinical: Medications: reviewed Medical History: anemia, previous GDM Labs: OGTT    Lab Results  Component Value Date   HGBA1C 6.0 (H) 02/27/2023    Dietary and Lifestyle History: Pt states she was prescribed pills last time for GDM but didn't take.  Pt complains of blurry vision and wondering if it is caused by high blood pressure.  Pt reports she is not tolerating sweets since her last pregnancy. Pt states she does eat fruit.  Hypoglycemia: Pt reports sxs 2 weeks ago of shaky, sweaty, dizzy, legs weak.   Physical Activity: ADL takes kids to the park in the afternoon Stress: not assessed Sleep: 8+ hours  24 hr Recall:  First Meal: 8 oz mil, cereal, fruit Snack: watermelon Second meal: meat, 1 c rice, 2 small mangos Snack: Third meal: meat ball, 1 c rice Snack:  Beverages: water, milk, juice "that is not too sweet"   NUTRITION INTERVENTION  Nutrition education (E-1) on the following topics:   Initial Follow-up  [x]  []  Definition of Gestational Diabetes []  []  Why dietary management is important in controlling blood glucose [x]  []  Effects each nutrient has on blood glucose levels []  []  Simple carbohydrates vs complex carbohydrates []  []  Fluid intake [x]  []  Creating a balanced meal plan []  []  Carbohydrate counting  [x]  []  When to check blood glucose levels [x]  []  Proper blood glucose monitoring techniques [x]  []  Effect of stress and stress reduction techniques  [x]  []  Exercise effect on blood glucose levels, appropriate exercise during pregnancy []  []  Importance of limiting caffeine and abstaining from alcohol and smoking [x]  []  Medications used for blood sugar control during pregnancy [x]  []  Hypoglycemia and rule  of 15 []  []  Postpartum self care  Blood glucose monitor given: Prodigy Lot # [id number] Exp: 2024-02-29 CBG: 130 mg/dL (fasting)  Patient instructed to monitor glucose levels: FBS: 60 - ? 95 mg/dL; 2 hour: ? 865 mg/dL  Patient received handouts: Nutrition Diabetes and Pregnancy Carbohydrate Counting List  Patient will be seen for follow-up as needed.

## 2023-03-21 ENCOUNTER — Other Ambulatory Visit: Payer: Self-pay

## 2023-03-21 ENCOUNTER — Ambulatory Visit (INDEPENDENT_AMBULATORY_CARE_PROVIDER_SITE_OTHER): Payer: Self-pay | Admitting: Registered"

## 2023-03-21 ENCOUNTER — Encounter: Payer: Self-pay | Admitting: Registered"

## 2023-03-21 ENCOUNTER — Encounter: Payer: Self-pay | Attending: Obstetrics and Gynecology | Admitting: Skilled Nursing Facility1

## 2023-03-21 DIAGNOSIS — O24419 Gestational diabetes mellitus in pregnancy, unspecified control: Secondary | ICD-10-CM

## 2023-03-21 DIAGNOSIS — Z3A22 22 weeks gestation of pregnancy: Secondary | ICD-10-CM

## 2023-03-21 DIAGNOSIS — Z3A25 25 weeks gestation of pregnancy: Secondary | ICD-10-CM | POA: Insufficient documentation

## 2023-03-21 DIAGNOSIS — Z713 Dietary counseling and surveillance: Secondary | ICD-10-CM | POA: Insufficient documentation

## 2023-03-21 DIAGNOSIS — O24410 Gestational diabetes mellitus in pregnancy, diet controlled: Secondary | ICD-10-CM | POA: Insufficient documentation

## 2023-03-27 LAB — ANEMIA PROFILE B
Basophils Absolute: 0 10*3/uL (ref 0.0–0.2)
Basos: 0 %
EOS (ABSOLUTE): 0.3 10*3/uL (ref 0.0–0.4)
Eos: 4 %
Ferritin: 18 ng/mL (ref 15–150)
Folate: 18.4 ng/mL (ref >3.0)
Hematocrit: 30.2 % — ABNORMAL LOW (ref 34.0–46.6)
Hemoglobin: 9.7 g/dL — ABNORMAL LOW (ref 11.1–15.9)
Immature Grans (Abs): 0.1 10*3/uL (ref 0.0–0.1)
Immature Granulocytes: 1 %
Iron Saturation: 15 % (ref 15–55)
Iron: 60 ug/dL (ref 27–159)
Lymphocytes Absolute: 1.7 10*3/uL (ref 0.7–3.1)
Lymphs: 22 %
MCH: 28.8 pg (ref 26.6–33.0)
MCHC: 32.1 g/dL (ref 31.5–35.7)
MCV: 90 fL (ref 79–97)
Monocytes Absolute: 0.5 10*3/uL (ref 0.1–0.9)
Monocytes: 6 %
Neutrophils Absolute: 5.2 10*3/uL (ref 1.4–7.0)
Neutrophils: 67 %
Platelets: 328 10*3/uL (ref 150–450)
RBC: 3.37 x10E6/uL — ABNORMAL LOW (ref 3.77–5.28)
RDW: 12.9 % (ref 11.7–15.4)
Total Iron Binding Capacity: 389 ug/dL (ref 250–450)
UIBC: 329 ug/dL (ref 131–425)
Vitamin B-12: 326 pg/mL (ref 232–1245)
WBC: 7.8 10*3/uL (ref 3.4–10.8)

## 2023-03-27 LAB — SPECIMEN STATUS REPORT

## 2023-03-28 ENCOUNTER — Other Ambulatory Visit: Payer: Self-pay | Admitting: Obstetrics and Gynecology

## 2023-03-28 ENCOUNTER — Telehealth: Payer: Self-pay

## 2023-03-28 NOTE — Telephone Encounter (Addendum)
-----   Message from [facility] sent at 03/28/2023  9:02 AM EDT ----- Please let her know I sent in some oral iron for her. Thanks  Left message with Spanish Intepreter Eda R., stating that we are calling to let you know the provider has sent an Rx for iron tablet to her [facility] on [address].  If she has questions please give [place] a call.   Leonette Nutting  03/28/23

## 2023-04-04 ENCOUNTER — Other Ambulatory Visit: Payer: Self-pay

## 2023-04-09 NOTE — Progress Notes (Unsigned)
   PRENATAL VISIT NOTE  Subjective:  [name] is a 42 y.o. [id number] at [redacted]w[redacted]d being seen today for ongoing prenatal care.  She is currently monitored for the following issues for this {Blank single:19197::"high-risk","low-risk"} pregnancy and has Obesity in pregnancy; Language barrier; AMA (advanced maternal age) multigravida 35+, second trimester; Supervision of high risk pregnancy, antepartum; GDM at 20 weeks; Anemia in pregnancy, second trimester; BMI 40.0-44.9, adult (HCC); Grand multiparity; and Late prenatal care affecting pregnancy in second trimester on their problem list.  Patient reports {sx:14538}.   .  .   . Denies leaking of fluid.   The following portions of the patient's history were reviewed and updated as appropriate: allergies, current medications, past family history, past medical history, past social history, past surgical history and problem list.   Objective:  There were no vitals filed for this visit.  Fetal Status:           General:  Alert, oriented and cooperative. Patient is in no acute distress.  Skin: Skin is warm and dry. No rash noted.   Cardiovascular: Normal heart rate noted  Respiratory: Normal respiratory effort, no problems with respiration noted  Abdomen: Soft, gravid, appropriate for gestational age.        Pelvic: {Blank single:19197::"Cervical exam performed in the presence of a chaperone","Cervical exam deferred"}        Extremities: Normal range of motion.     Mental Status: Normal mood and affect. Normal behavior. Normal judgment and thought content.   Assessment and Plan:  Pregnancy: [id number] at [redacted]w[redacted]d 1. Diet controlled gestational diabetes mellitus (GDM) in third trimester Fasting *** 2hr *** Ordered US due to early GDM and need for regular growth assessment.   2. Obesity in pregnancy TWG=-12 lb 11.2 oz (-5.761 kg)   3. Supervision of high risk pregnancy, antepartum Up to date Feeling good movement No concerns  today Reviewed [facility] [place] which showed anterior and low lying placenta  4. Grand multiparity High PPH risk  5. Anemia in pregnancy, second trimester Lab Results  Component Value Date   HGB 9.7 (L) 02/27/2023   HGB 9.7 (L) 02/27/2023  On oral fe POC HGB today =***   {Blank single:19197::"Term","Preterm"} labor symptoms and general obstetric precautions including but not limited to vaginal bleeding, contractions, leaking of fluid and fetal movement were reviewed in detail with the patient. Please refer to After Visit Summary for other counseling recommendations.   No follow-ups on file.  Future Appointments  Date Time Provider Department Center  04/10/2023 11:15 AM Federico Flake, MD [facility]    Federico Flake, MD

## 2023-04-10 ENCOUNTER — Other Ambulatory Visit: Payer: Self-pay

## 2023-04-10 ENCOUNTER — Ambulatory Visit (INDEPENDENT_AMBULATORY_CARE_PROVIDER_SITE_OTHER): Payer: Self-pay | Admitting: Family Medicine

## 2023-04-10 ENCOUNTER — Other Ambulatory Visit: Payer: Self-pay | Admitting: Family Medicine

## 2023-04-10 ENCOUNTER — Encounter: Payer: Self-pay | Admitting: Certified Nurse Midwife

## 2023-04-10 VITALS — BP 94/67 | HR 82 | Wt 229.7 lb

## 2023-04-10 DIAGNOSIS — O24410 Gestational diabetes mellitus in pregnancy, diet controlled: Secondary | ICD-10-CM

## 2023-04-10 DIAGNOSIS — Z3A25 25 weeks gestation of pregnancy: Secondary | ICD-10-CM

## 2023-04-10 DIAGNOSIS — O99212 Obesity complicating pregnancy, second trimester: Secondary | ICD-10-CM

## 2023-04-10 DIAGNOSIS — O99012 Anemia complicating pregnancy, second trimester: Secondary | ICD-10-CM

## 2023-04-10 DIAGNOSIS — O0990 Supervision of high risk pregnancy, unspecified, unspecified trimester: Secondary | ICD-10-CM

## 2023-04-10 DIAGNOSIS — O99210 Obesity complicating pregnancy, unspecified trimester: Secondary | ICD-10-CM

## 2023-04-10 DIAGNOSIS — O0992 Supervision of high risk pregnancy, unspecified, second trimester: Secondary | ICD-10-CM

## 2023-04-10 DIAGNOSIS — Z641 Problems related to multiparity: Secondary | ICD-10-CM

## 2023-04-10 LAB — POCT HEMOGLOBIN-HEMACUE: Hemoglobin: 8.5 g/dL — ABNORMAL LOW (ref 12.0–15.0)

## 2023-05-08 ENCOUNTER — Encounter: Payer: Self-pay | Admitting: Obstetrics and Gynecology

## 2023-05-14 ENCOUNTER — Ambulatory Visit (INDEPENDENT_AMBULATORY_CARE_PROVIDER_SITE_OTHER): Payer: Self-pay | Admitting: Advanced Practice Midwife

## 2023-05-14 ENCOUNTER — Other Ambulatory Visit: Payer: Self-pay

## 2023-05-14 VITALS — BP 92/69 | HR 108 | Wt 232.0 lb

## 2023-05-14 DIAGNOSIS — O0933 Supervision of pregnancy with insufficient antenatal care, third trimester: Secondary | ICD-10-CM

## 2023-05-14 DIAGNOSIS — Z603 Acculturation difficulty: Secondary | ICD-10-CM

## 2023-05-14 DIAGNOSIS — F32A Depression, unspecified: Secondary | ICD-10-CM

## 2023-05-14 DIAGNOSIS — O0993 Supervision of high risk pregnancy, unspecified, third trimester: Secondary | ICD-10-CM

## 2023-05-14 DIAGNOSIS — Z3A31 31 weeks gestation of pregnancy: Secondary | ICD-10-CM

## 2023-05-14 DIAGNOSIS — O99013 Anemia complicating pregnancy, third trimester: Secondary | ICD-10-CM

## 2023-05-14 DIAGNOSIS — Z758 Other problems related to medical facilities and other health care: Secondary | ICD-10-CM

## 2023-05-14 DIAGNOSIS — O99012 Anemia complicating pregnancy, second trimester: Secondary | ICD-10-CM

## 2023-05-14 DIAGNOSIS — O0990 Supervision of high risk pregnancy, unspecified, unspecified trimester: Secondary | ICD-10-CM

## 2023-05-14 DIAGNOSIS — O09522 Supervision of elderly multigravida, second trimester: Secondary | ICD-10-CM

## 2023-05-14 DIAGNOSIS — O99213 Obesity complicating pregnancy, third trimester: Secondary | ICD-10-CM

## 2023-05-14 DIAGNOSIS — O0932 Supervision of pregnancy with insufficient antenatal care, second trimester: Secondary | ICD-10-CM

## 2023-05-14 DIAGNOSIS — O99210 Obesity complicating pregnancy, unspecified trimester: Secondary | ICD-10-CM

## 2023-05-14 DIAGNOSIS — O24410 Gestational diabetes mellitus in pregnancy, diet controlled: Secondary | ICD-10-CM

## 2023-05-14 DIAGNOSIS — Z641 Problems related to multiparity: Secondary | ICD-10-CM

## 2023-05-14 DIAGNOSIS — O09523 Supervision of elderly multigravida, third trimester: Secondary | ICD-10-CM

## 2023-05-14 DIAGNOSIS — O99343 Other mental disorders complicating pregnancy, third trimester: Secondary | ICD-10-CM

## 2023-05-14 LAB — GLUCOSE, CAPILLARY: Glucose-Capillary: 125 mg/dL — ABNORMAL HIGH (ref 70–99)

## 2023-05-14 NOTE — Patient Instructions (Signed)
Tubal Ligation with C/S or Postpartum   With C/S: $1170  Postpartum: $1280 + anesthesia   Patient needs to inform staff of desire for tubal ligation as early as possible in the pregnancy  Pre-payment is required for procedure to be scheduled  Payment plans are available upon request   * Patient will need to call [facility] (Anesthesia) at [phone] for estimate   Ligadura de trompas con cesrea o despus del parto Con cesrea: $1170 Despus del parto: $1280 ms la anestesia La paciente debe informarle al personal de su deseo de hacerse la ligadura de trompas tan pronto  como le sea posible durante el [place]. Se requiere el pago por adelantado para que el procedimiento pueda programarse. Los planes de pago estn disponibles por solicitud. * La paciente debe llamar a [facility] Janann August) al [phone] para pedir la cotizacin.   Outpatient tubal ligation   Estimated out-of-pocket total for patient (does NOT include anesthesia)  Procedure fee + Facility fee  Salpingectomy: $855 + $5835.90 = $6690.90 [place]: $497.80 + $5835.90 = $6333.70 [place]: $497.80 + $5835.90 = $9485.46  Patient needs to inform staff of desire for tubal ligation Pre-payment of procedure fee is required for procedure to be scheduled  Payment plans are available upon request  Need to apply for Financial Aid to reduce your cost, ask [place] how!   * Patient will need to call [facility] (Anesthesia) at [phone] for estimate   Ligadura de trompas para pacientes ambulatorios Costo total aproximado que le toca a la paciente (NO incluye la anestesia) Costo del procedimiento ms costo del centro Salpingectoma: $855 + $5835.90 = $1829.93 [place]: $497.80 + $5835.90 = $6333.70 [place] (clips) [place]: $497.80 + $5835.90 = $6333.70 La paciente debe informarle al personal de su deseo de hacerse la ligadura de trompas. Se requiere el pago por adelantado del costo del procedimiento para que este se Fish farm manager. Los planes de pago estn disponibles por solicitud. * La paciente debe llamar a [facility] Jan[facility] August) al [phone] para pedir la cotizacin.    You are 31 weeks and 4 days today.

## 2023-05-14 NOTE — Progress Notes (Signed)
   PRENATAL VISIT NOTE  Subjective:  [name] is a 42 y.o. [id number] at [redacted]w[redacted]d being seen today for ongoing prenatal care.  She is currently monitored for the following issues for this high-risk pregnancy and has Obesity in pregnancy; Language barrier; AMA (advanced maternal age) multigravida 35+, second trimester; Supervision of high risk pregnancy, antepartum; GDM at 20 weeks; Anemia in pregnancy, second trimester; BMI 40.0-44.9, adult (HCC); Grand multiparity; Late prenatal care affecting pregnancy in second trimester; and Depression during pregnancy, antepartum on their problem list.  Patient reports  anxiety and depression followed by counselor which is helping. Lost testing supplies on bus. Not able to test CBGs  .  Contractions: Irritability. Vag. Bleeding: None.  Movement: Present. Denies leaking of fluid.   The following portions of the patient's history were reviewed and updated as appropriate: allergies, current medications, past family history, past medical history, past social history, past surgical history and problem list.   Objective:   Vitals:   05/14/23 1432  BP: 92/69  Pulse: (!) 108  Weight: 232 lb (105.2 kg)    Fetal Status: Fetal Heart Rate (bpm): 143   Movement: Present     General:  Alert, oriented and cooperative. Patient is in no acute distress.  Skin: Skin is warm and dry. No rash noted.   Cardiovascular: Normal heart rate noted  Respiratory: Normal respiratory effort, no problems with respiration noted  Abdomen: Soft, gravid, appropriate for gestational age.  Pain/Pressure: Present     Pelvic: Cervical exam deferred        Extremities: Normal range of motion.  Edema: None  Mental Status: Normal mood and affect. Normal behavior. Normal judgment and thought content.   CBG 125  Assessment and Plan:  Pregnancy: [id number] at [redacted]w[redacted]d 1. Supervision of high risk pregnancy, antepartum - F/U 1 week   2. [redacted] weeks gestation of pregnancy   3. Diet  controlled gestational diabetes mellitus (GDM) in third trimester - F/U 1 week due to not having CBGs checked, unknown control - New testing supplies given  4. Obesity in pregnancy - Encouraged to apply for financial assistance to cover US's recommended by MFM.   5. Language barrier - Declined interpreter  6. Late prenatal care affecting pregnancy in second trimester   7. Grand multiparity   8. Anemia in pregnancy, second trimester - Recommend iron infusion but does not have insurance. Encourage to apply for financial assistance. Continue PO iron and increase dietary iron.   9. AMA (advanced maternal age) multigravida 35+, second trimester - Growth [place]   Preterm labor symptoms and general obstetric precautions including but not limited to vaginal bleeding, contractions, leaking of fluid and fetal movement were reviewed in detail with the patient. Please refer to After Visit Summary for other counseling recommendations.   Return in about 1 week (around 05/21/2023) for [facility] .  Future Appointments  Date Time Provider Department Center  05/23/2023  2:35 PM Hermina Staggers, MD [facility]    Dorathy Kinsman, [place]

## 2023-05-20 ENCOUNTER — Telehealth: Payer: Self-pay

## 2023-05-20 NOTE — Telephone Encounter (Addendum)
-----   Message from [place] sent at 05/15/2023 11:53 PM EDT ----- Regarding: [facility] Growth [place] Apologies if this is a duplicate request. Please schedule growth [place] at [facility] in 4 weeks. Encourage pt to turn in financial assistance paperwork too.  [facility] [place] scheduled 06/14/23 9:30 AM. Will have formed signed by Dorathy Kinsman and faxed. Need to contact patient when Spanish interpreter returns in the AM.

## 2023-05-23 ENCOUNTER — Encounter: Payer: Self-pay | Admitting: Obstetrics and Gynecology

## 2023-05-23 ENCOUNTER — Other Ambulatory Visit: Payer: Self-pay

## 2023-05-23 ENCOUNTER — Ambulatory Visit (INDEPENDENT_AMBULATORY_CARE_PROVIDER_SITE_OTHER): Payer: Self-pay | Admitting: Obstetrics and Gynecology

## 2023-05-23 VITALS — BP 115/86 | HR 101 | Wt 232.4 lb

## 2023-05-23 DIAGNOSIS — O0993 Supervision of high risk pregnancy, unspecified, third trimester: Secondary | ICD-10-CM

## 2023-05-23 DIAGNOSIS — O24410 Gestational diabetes mellitus in pregnancy, diet controlled: Secondary | ICD-10-CM

## 2023-05-23 DIAGNOSIS — O0990 Supervision of high risk pregnancy, unspecified, unspecified trimester: Secondary | ICD-10-CM

## 2023-05-23 DIAGNOSIS — Z3009 Encounter for other general counseling and advice on contraception: Secondary | ICD-10-CM

## 2023-05-23 DIAGNOSIS — O09523 Supervision of elderly multigravida, third trimester: Secondary | ICD-10-CM

## 2023-05-23 DIAGNOSIS — O09522 Supervision of elderly multigravida, second trimester: Secondary | ICD-10-CM

## 2023-05-23 DIAGNOSIS — Z3A32 32 weeks gestation of pregnancy: Secondary | ICD-10-CM

## 2023-05-23 DIAGNOSIS — Z9851 Tubal ligation status: Secondary | ICD-10-CM | POA: Insufficient documentation

## 2023-05-23 MED ORDER — METFORMIN HCL 500 MG PO TABS
500.0000 mg | ORAL_TABLET | Freq: Two times a day (BID) | ORAL | 5 refills | Status: DC
Start: 2023-05-23 — End: 2023-06-18

## 2023-05-23 NOTE — Progress Notes (Signed)
Did not bring glucose log.  Monday 2 HR PP was 249 Fasting numbers all over 100

## 2023-05-23 NOTE — Progress Notes (Signed)
Subjective:  [name] is a 42 y.o. [id number] at [redacted]w[redacted]d being seen today for ongoing prenatal care.  She is currently monitored for the following issues for this high-risk pregnancy and has Obesity in pregnancy; Language barrier; AMA (advanced maternal age) multigravida 35+, second trimester; Supervision of high risk pregnancy, antepartum; GDM at 20 weeks; Anemia in pregnancy, second trimester; BMI 40.0-44.9, adult (HCC); Grand multiparity; Late prenatal care affecting pregnancy in second trimester; Depression during pregnancy, antepartum; and Unwanted fertility on their problem list.  Patient reports  general discomforts of pregnancy .  Contractions: Not present. Vag. Bleeding: None.  Movement: Present. Denies leaking of fluid.   The following portions of the patient's history were reviewed and updated as appropriate: allergies, current medications, past family history, past medical history, past social history, past surgical history and problem list. Problem list updated.  Objective:   Vitals:   05/23/23 1446  BP: 115/86  Pulse: (!) 101  Weight: 232 lb 6.4 oz (105.4 kg)    Fetal Status: Fetal Heart Rate (bpm): 160   Movement: Present     General:  Alert, oriented and cooperative. Patient is in no acute distress.  Skin: Skin is warm and dry. No rash noted.   Cardiovascular: Normal heart rate noted  Respiratory: Normal respiratory effort, no problems with respiration noted  Abdomen: Soft, gravid, appropriate for gestational age. Pain/Pressure: Absent     Pelvic:  Cervical exam deferred        Extremities: Normal range of motion.  Edema: None  Mental Status: Normal mood and affect. Normal behavior. Normal judgment and thought content.   Urinalysis:      Assessment and Plan:  Pregnancy: [id number] at [redacted]w[redacted]d  1. Supervision of high risk pregnancy, antepartum Stable Information to get Tdap and Flu vaccine at [facility]  2. Diet controlled gestational diabetes mellitus (GDM) in  third trimester CBG's elevated per pt's reports Did not bring CBG readings Advised pt to bring CBG readings to all OB appt Will start Metformin. Indications for staring reviewed with Growth scan with [facility] ordered Will start weekly antenatal test here. Indications discussed - metFORMIN (GLUCOPHAGE) 500 MG tablet; Take 1 tablet (500 mg total) by mouth 2 (two) times daily with a meal.  Dispense: 60 tablet; Refill: 5  3. AMA (advanced maternal age) multigravida 35+, second trimester Stable  4. Unwanted fertility Self pay Information on payment provided to pt  Preterm labor symptoms and general obstetric precautions including but not limited to vaginal bleeding, contractions, leaking of fluid and fetal movement were reviewed in detail with the patient. Please refer to After Visit Summary for other counseling recommendations.  Return in about 1 week (around 05/30/2023) for OB visit, face to face, MD only.   Hermina Staggers, MD

## 2023-05-23 NOTE — Patient Instructions (Addendum)
Ultrasound scheduled 06/14/23 9:30 AM  [address]

## 2023-05-23 NOTE — Telephone Encounter (Signed)
Patient seen for visit today and I reviewed [place] appt in person.

## 2023-05-25 ENCOUNTER — Encounter (HOSPITAL_COMMUNITY): Payer: Self-pay | Admitting: Obstetrics & Gynecology

## 2023-05-25 ENCOUNTER — Other Ambulatory Visit: Payer: Self-pay

## 2023-05-25 ENCOUNTER — Inpatient Hospital Stay (HOSPITAL_COMMUNITY)
Admission: AD | Admit: 2023-05-25 | Discharge: 2023-05-26 | Disposition: A | Discharge status: Home / Self Care | Payer: Self-pay | Attending: Obstetrics & Gynecology | Admitting: Obstetrics & Gynecology

## 2023-05-25 DIAGNOSIS — O0943 Supervision of pregnancy with grand multiparity, third trimester: Secondary | ICD-10-CM | POA: Insufficient documentation

## 2023-05-25 DIAGNOSIS — R109 Unspecified abdominal pain: Secondary | ICD-10-CM | POA: Insufficient documentation

## 2023-05-25 DIAGNOSIS — K8070 Calculus of gallbladder and bile duct without cholecystitis without obstruction: Secondary | ICD-10-CM

## 2023-05-25 DIAGNOSIS — O26893 Other specified pregnancy related conditions, third trimester: Secondary | ICD-10-CM | POA: Insufficient documentation

## 2023-05-25 DIAGNOSIS — Z3689 Encounter for other specified antenatal screening: Secondary | ICD-10-CM | POA: Insufficient documentation

## 2023-05-25 DIAGNOSIS — K8020 Calculus of gallbladder without cholecystitis without obstruction: Secondary | ICD-10-CM | POA: Insufficient documentation

## 2023-05-25 DIAGNOSIS — M25552 Pain in left hip: Secondary | ICD-10-CM | POA: Insufficient documentation

## 2023-05-25 DIAGNOSIS — O26613 Liver and biliary tract disorders in pregnancy, third trimester: Secondary | ICD-10-CM | POA: Insufficient documentation

## 2023-05-25 DIAGNOSIS — Z3A33 33 weeks gestation of pregnancy: Secondary | ICD-10-CM | POA: Insufficient documentation

## 2023-05-25 DIAGNOSIS — O09523 Supervision of elderly multigravida, third trimester: Secondary | ICD-10-CM | POA: Insufficient documentation

## 2023-05-25 DIAGNOSIS — M549 Dorsalgia, unspecified: Secondary | ICD-10-CM | POA: Insufficient documentation

## 2023-05-25 MED ORDER — LACTATED RINGERS IV BOLUS
1000.0000 mL | Freq: Once | INTRAVENOUS | Status: AC
  Administered 2023-05-25: 1000 mL via INTRAVENOUS

## 2023-05-25 MED ORDER — ONDANSETRON HCL 4 MG/2ML IJ SOLN
4.0000 mg | Freq: Once | INTRAMUSCULAR | Status: AC
  Administered 2023-05-25: 4 mg via INTRAVENOUS
  Filled 2023-05-25: qty 2

## 2023-05-25 MED ORDER — CYCLOBENZAPRINE HCL 5 MG PO TABS: 10.0000 mg | ORAL_TABLET | Freq: Once | ORAL | Status: DC

## 2023-05-25 MED ORDER — ONDANSETRON 4 MG PO TBDP: 8.0000 mg | ORAL_TABLET | Freq: Once | ORAL | Status: DC

## 2023-05-25 MED ORDER — MORPHINE SULFATE (PF) 4 MG/ML IV SOLN
4.0000 mg | Freq: Once | INTRAVENOUS | Status: DC
  Filled 2023-05-25: qty 1

## 2023-05-25 MED ORDER — ACETAMINOPHEN 500 MG PO TABS: 1000.0000 mg | ORAL_TABLET | Freq: Once | ORAL | Status: DC

## 2023-05-25 NOTE — MAU Provider Note (Signed)
History     CSN: [id number]  Arrival date and time: 05/25/23 2143   Event Date/Time   First Provider Initiated Contact with Patient 05/25/23 2237      Chief Complaint  Patient presents with   Back Pain   Vomiting   [name] is a 42 y.o. [id number] at [redacted]w[redacted]d who receives care at [facility]  She presents today for abdominal cramping.  Patient states she started having lower left side cramping for the past 3 days. She states the pain is starting to radiate to her upper left side and hip area.  Patient states the pain has no relieving or aggravating factors.  She states the pain is intermittent and rates it a 8/10.  Patient endorses fetal movement. She denies vaginal bleeding or discharge. However, she does report some intermittent itching.  OB History     Gravida  10   Para  5   Term  5   Preterm  0   AB  3   Living  6      SAB  0   IAB  2   Ectopic  1   Multiple  0   Live Births  6           Past Medical History:  Diagnosis Date   AMA (advanced maternal age) multigravida 35+ 12/26/2018   Anxiety    relative to circumstances to back problems/injury, not taking any medicine, pt. reports has been crying a lot   Arthritis    low back   ASCUS of cervix with negative high risk HPV 10/20/2018   Asthma    Back pain    Cholelithiasis 04/08/2020   Chronic pain following surgery or procedure 05/29/2013   Depression    Depression with suicidal ideation 05/29/2013   GERD (gastroesophageal reflux disease)    HNP (herniated nucleus pulposus), lumbar 02/04/2013   Maternal obesity affecting pregnancy, antepartum 09/27/2020   Migraines    Neck pain    PTSD (post-traumatic stress disorder) 05/29/2013   Suicide attempt by multiple drug overdose (HCC) 05/29/2013   Supervision of high risk pregnancy, antepartum 09/27/2020          Nursing Staff  Provider  Office Location   [facility] (Tx from [facility])  Dating      Language   Spanish  Anatomy US   normal  Flu Vaccine    09/27/2020  Genetic Screen   NIPS: low risks  AFP:   First Screen:  Quad:      TDaP Vaccine    declines  Hgb A1C or   GTT  Early   Third trimester   COVID Vaccine  1st dose Nov 2021     LAB RESULTS   Rhogam      Blood Type    O pos  Feeding Plan  Breast   Antibody     Unstable lie of fetus 11/03/2020   Vaginal delivery 12/27/2018    Past Surgical History:  Procedure Laterality Date   childbirth     x4 vaginal    LUMBAR LAMINECTOMY/DECOMPRESSION MICRODISCECTOMY  06/05/2012   Procedure: LUMBAR LAMINECTOMY/DECOMPRESSION MICRODISCECTOMY;  Surgeon: Emilee Hero, MD;  Location: [facility] OR;  Service: Orthopedics;  Laterality: Left;  Left sided lumbar 4-5 microdisectomy    Family History  Problem Relation Age of Onset   Asthma Brother     Social History   Tobacco Use   Smoking status: Never   Smokeless tobacco: Never  Vaping Use   Vaping status: Never  Used  Substance Use Topics   Alcohol use: No   Drug use: No    Allergies:  Allergies  Allergen Reactions   Hydrocodone-Acetaminophen Itching    Has some itching on face & nose but not bad enough to stop taking it/ MD aware and instructed patient to take anti-allergy medication prn.    Medications Prior to Admission  Medication Sig Dispense Refill Last Dose   albuterol (VENTOLIN HFA) 108 (90 Base) MCG/ACT inhaler Inhale 1-2 puffs into the lungs every 6 (six) hours as needed for wheezing or shortness of breath. 18 g 0 Past Week   aspirin EC 81 MG tablet Take 1 tablet (81 mg total) by mouth daily. 60 tablet 1 05/25/2023   metFORMIN (GLUCOPHAGE) 500 MG tablet Take 1 tablet (500 mg total) by mouth 2 (two) times daily with a meal. 60 tablet 5 05/25/2023   Prenatal Vit-Fe Fumarate-FA (MULTIVITAMIN-PRENATAL) 27-0.8 MG TABS tablet Take 1 tablet by mouth daily at 12 noon.   05/25/2023   cyclobenzaprine (FLEXERIL) 10 MG tablet Take 1 tablet (10 mg total) by mouth 2 (two) times daily as needed for muscle spasms. (Patient not taking: Reported on  02/27/2023) 20 tablet 0    ferrous sulfate 324 MG TBEC Take 1 tablet (324 mg total) by mouth every other day for 42 doses. (Patient not taking: Reported on 05/14/2023) 42 tablet 0    gabapentin (NEURONTIN) 300 MG capsule Take 1 capsule (300 mg total) by mouth 3 (three) times daily. (Patient not taking: Reported on 03/07/2023) 30 capsule 0    lidocaine (LIDODERM) 5 % Place 1 patch onto the skin daily. Remove & Discard patch within 12 hours or as directed by MD 14 patch 0 Unknown   omeprazole (PRILOSEC) 20 MG capsule Take 1 capsule (20 mg total) by mouth daily. (Patient not taking: Reported on 05/23/2023) 30 capsule 0     Review of Systems  Gastrointestinal:  Positive for abdominal pain (Left side) and rectal pain (Pressure). Negative for constipation and diarrhea.       Increased bowel movements from 2-3x/day to 6x/day.  No hard or loose stool.  Genitourinary:  Negative for difficulty urinating, dysuria, vaginal bleeding and vaginal discharge.   Physical Exam   Blood pressure 107/73, pulse 94, temperature 98.7 F (37.1 C), temperature source Oral, resp. rate 18, height 5\' 2"  (1.575 m), weight 106.7 kg, last menstrual period 10/16/2022, SpO2 98%, unknown if currently breastfeeding.  Physical Exam Vitals reviewed. Exam conducted with a chaperone present (JaMaya, NT).  Constitutional:      Appearance: Normal appearance. She is obese.  HENT:     Head: Normocephalic and atraumatic.  Eyes:     Conjunctiva/sclera: Conjunctivae normal.  Cardiovascular:     Rate and Rhythm: Normal rate.  Pulmonary:     Effort: Pulmonary effort is normal. No respiratory distress.  Abdominal:     Palpations: Abdomen is soft.     Tenderness: There is abdominal tenderness in the epigastric area and left upper quadrant. There is no guarding or rebound.       Comments: ? Mass palpated, but unable to distinguish as palpating causes patient to vomit.   Genitourinary:    General: Normal vulva.  Musculoskeletal:         General: Normal range of motion.     Cervical back: Normal range of motion.  Skin:    General: Skin is warm and dry.  Neurological:     Mental Status: [name] and oriented to person,  place, and time.  Psychiatric:        Mood and Affect: Mood normal.        Behavior: Behavior normal.     Fetal Assessment 135 bpm, Mod Var, -Decels, +Accels Toco: No ctx graphed  MAU Course   Results for orders placed or performed during the hospital encounter of 05/25/23 (from the past 24 hour(s))  CBC with Differential/Platelet     Status: Abnormal   Collection Time: 05/25/23 10:47 PM  Result Value Ref Range   WBC 10.5 4.0 - 10.5 K/uL   RBC 3.26 (L) 3.87 - 5.11 MIL/uL   Hemoglobin 8.5 (L) 12.0 - 15.0 g/dL   HCT 16.1 (L) 09.6 - 04.5 %   MCV 82.8 80.0 - 100.0 fL   MCH 26.1 26.0 - 34.0 pg   MCHC 31.5 30.0 - 36.0 g/dL   RDW 40.9 81.1 - 91.4 %   Platelets 334 150 - 400 K/uL   nRBC 0.0 0.0 - 0.2 %   Neutrophils Relative % 68 %   Neutro Abs 7.2 1.7 - 7.7 K/uL   Lymphocytes Relative 21 %   Lymphs Abs 2.2 0.7 - 4.0 K/uL   Monocytes Relative 7 %   Monocytes Absolute 0.7 0.1 - 1.0 K/uL   Eosinophils Relative 3 %   Eosinophils Absolute 0.3 0.0 - 0.5 K/uL   Basophils Relative 0 %   Basophils Absolute 0.0 0.0 - 0.1 K/uL   Immature Granulocytes 1 %   Abs Immature Granulocytes 0.06 0.00 - 0.07 K/uL  Lipase, blood     Status: None   Collection Time: 05/25/23 10:47 PM  Result Value Ref Range   Lipase 24 11 - 51 U/L  Comprehensive metabolic panel     Status: Abnormal   Collection Time: 05/25/23 10:47 PM  Result Value Ref Range   Sodium 135 135 - 145 mmol/L   Potassium 3.6 3.5 - 5.1 mmol/L   Chloride 105 98 - 111 mmol/L   CO2 21 (L) 22 - 32 mmol/L   Glucose, Bld 109 (H) 70 - 99 mg/dL   BUN 5 (L) 6 - 20 mg/dL   Creatinine, Ser 7.82 0.44 - 1.00 mg/dL   Calcium 8.8 (L) 8.9 - 10.3 mg/dL   Total Protein 6.7 6.5 - 8.1 g/dL   Albumin 2.4 (L) 3.5 - 5.0 g/dL   AST 20 15 - 41 U/L   ALT 21 0 - 44  U/L   Alkaline Phosphatase 152 (H) 38 - 126 U/L   Total Bilirubin 0.4 0.3 - 1.2 mg/dL   GFR, Estimated >95 >62 mL/min   Anion gap 9 5 - 15  Wet prep, genital     Status: Abnormal   Collection Time: 05/26/23 12:04 AM  Result Value Ref Range   Yeast Wet Prep HPF POC PRESENT (A) NONE SEEN   Trich, Wet Prep NONE SEEN NONE SEEN   Clue Cells Wet Prep HPF POC NONE SEEN NONE SEEN   WBC, Wet Prep HPF POC <10 <10   Sperm NONE SEEN    No results found.  MDM PE Labs: CBC/D, BMP, Lipase Wet Prep EFM Pain medication Ultrasound Assessment and Plan  42 year old [id number]  SIUP at 33.1 weeks Cat I FT Abdominal Pain Language Barrier  -POC Reviewed -Exam performed and findings discussed. -Patient with nausea and vomiting after exam. -Pelvic exam deferred for now. -Plan for IV with LR bolus, morphine for pain, and zofran for nausea. -Once stable will reassess and send for [place]. -  Labs ordered.  -Interpretations completed with assistance of video interpreter: Debarah Crape [id number]. -Of note, patient speaking English without difficulty.   Cherre Robins MSN, CNM 05/25/2023, 10:37 PM   Reassessment (12:23 AM) -Patient reports nausea has resolved and pain improved with medications. -Pelvic exam completed. -Wet prep collected. -Will send for [place] and await results.   Reassessment (1:23 AM) -Results as above. -Provider at bedside to discuss. -Informed of cholelithiasis and need for modifications and dietary intake. -Patient reports she is aware and has known history. -Patient instructed to monitor symptoms and report any worsening or new. -Prescription for Terazol 3 sent to pharmacy on file. -Discharged to home in stable condition.  Cherre Robins MSN, CNM Advanced Practice Provider, [facility] [employer]

## 2023-05-25 NOTE — MAU Note (Signed)
.  [name] is a 42 y.o. at [redacted]w[redacted]d here in MAU reporting: has been vomiting for the past two hours.  Pt also reports that she has pain on her left side.  Pt states that she is feeling lightheaded and dizzy after vomiting.  Pt reports that she does not have anything at home to take for vomiting.  She did check CBG and it was 147.  Denies VB or LOF.  +FM   Onset of complaint: two hours ago  Pain score: 4 Vitals:   05/25/23 2159  BP: 107/73  Pulse: 94  Resp: 18  Temp: 98.7 F (37.1 C)  SpO2: 98%     Lab orders placed from triage:    None

## 2023-05-26 ENCOUNTER — Inpatient Hospital Stay (HOSPITAL_COMMUNITY): Payer: Self-pay

## 2023-05-26 DIAGNOSIS — O26613 Liver and biliary tract disorders in pregnancy, third trimester: Secondary | ICD-10-CM

## 2023-05-26 DIAGNOSIS — Z3A33 33 weeks gestation of pregnancy: Secondary | ICD-10-CM

## 2023-05-26 DIAGNOSIS — K8070 Calculus of gallbladder and bile duct without cholecystitis without obstruction: Secondary | ICD-10-CM

## 2023-05-26 LAB — COMPREHENSIVE METABOLIC PANEL
ALT: 21 U/L (ref 0–44)
AST: 20 U/L (ref 15–41)
Albumin: 2.4 g/dL — ABNORMAL LOW (ref 3.5–5.0)
Alkaline Phosphatase: 152 U/L — ABNORMAL HIGH (ref 38–126)
Anion gap: 9 (ref 5–15)
BUN: 5 mg/dL — ABNORMAL LOW (ref 6–20)
CO2: 21 mmol/L — ABNORMAL LOW (ref 22–32)
Calcium: 8.8 mg/dL — ABNORMAL LOW (ref 8.9–10.3)
Chloride: 105 mmol/L (ref 98–111)
Creatinine, Ser: 0.44 mg/dL (ref 0.44–1.00)
GFR, Estimated: >60 mL/min (ref >60)
Glucose, Bld: 109 mg/dL — ABNORMAL HIGH (ref 70–99)
Potassium: 3.6 mmol/L (ref 3.5–5.1)
Sodium: 135 mmol/L (ref 135–145)
Total Bilirubin: 0.4 mg/dL (ref 0.3–1.2)
Total Protein: 6.7 g/dL (ref 6.5–8.1)

## 2023-05-26 LAB — CBC WITH DIFFERENTIAL/PLATELET
Abs Immature Granulocytes: 0.06 10*3/uL (ref 0.00–0.07)
Basophils Absolute: 0 10*3/uL (ref 0.0–0.1)
Basophils Relative: 0 %
Eosinophils Absolute: 0.3 10*3/uL (ref 0.0–0.5)
Eosinophils Relative: 3 %
HCT: 27 % — ABNORMAL LOW (ref 36.0–46.0)
Hemoglobin: 8.5 g/dL — ABNORMAL LOW (ref 12.0–15.0)
Immature Granulocytes: 1 %
Lymphocytes Relative: 21 %
Lymphs Abs: 2.2 10*3/uL (ref 0.7–4.0)
MCH: 26.1 pg (ref 26.0–34.0)
MCHC: 31.5 g/dL (ref 30.0–36.0)
MCV: 82.8 fL (ref 80.0–100.0)
Monocytes Absolute: 0.7 10*3/uL (ref 0.1–1.0)
Monocytes Relative: 7 %
Neutro Abs: 7.2 10*3/uL (ref 1.7–7.7)
Neutrophils Relative %: 68 %
Platelets: 334 10*3/uL (ref 150–400)
RBC: 3.26 MIL/uL — ABNORMAL LOW (ref 3.87–5.11)
RDW: 14 % (ref 11.5–15.5)
WBC: 10.5 10*3/uL (ref 4.0–10.5)
nRBC: 0 % (ref 0.0–0.2)

## 2023-05-26 LAB — WET PREP, GENITAL
Clue Cells Wet Prep HPF POC: NONE SEEN
Sperm: NONE SEEN
Trich, Wet Prep: NONE SEEN
WBC, Wet Prep HPF POC: <10 (ref <10)

## 2023-05-26 LAB — LIPASE, BLOOD: Lipase: 24 U/L (ref 11–51)

## 2023-05-26 MED ORDER — TERCONAZOLE 0.8 % VA CREA: 1.0000 | TOPICAL_CREAM | Freq: Every day | VAGINAL | 0 refills | Status: DC

## 2023-05-26 MED ORDER — MORPHINE SULFATE (PF) 4 MG/ML IV SOLN
4.0000 mg | Freq: Once | INTRAVENOUS | Status: AC
  Administered 2023-05-26: 4 mg via INTRAVENOUS

## 2023-06-06 ENCOUNTER — Encounter: Payer: Self-pay | Admitting: Obstetrics and Gynecology

## 2023-06-18 ENCOUNTER — Ambulatory Visit (INDEPENDENT_AMBULATORY_CARE_PROVIDER_SITE_OTHER): Payer: Self-pay | Admitting: Family Medicine

## 2023-06-18 ENCOUNTER — Encounter: Payer: Self-pay | Admitting: Family Medicine

## 2023-06-18 ENCOUNTER — Other Ambulatory Visit (HOSPITAL_COMMUNITY)
Admission: RE | Admit: 2023-06-18 | Discharge: 2023-06-18 | Disposition: A | Discharge status: Home / Self Care | Payer: Self-pay | Source: Ambulatory Visit | Attending: Family Medicine | Admitting: Family Medicine

## 2023-06-18 ENCOUNTER — Other Ambulatory Visit: Payer: Self-pay

## 2023-06-18 VITALS — BP 105/73 | HR 99 | Wt 239.2 lb

## 2023-06-18 DIAGNOSIS — O403XX0 Polyhydramnios, third trimester, not applicable or unspecified: Secondary | ICD-10-CM

## 2023-06-18 DIAGNOSIS — Z758 Other problems related to medical facilities and other health care: Secondary | ICD-10-CM

## 2023-06-18 DIAGNOSIS — O0990 Supervision of high risk pregnancy, unspecified, unspecified trimester: Secondary | ICD-10-CM

## 2023-06-18 DIAGNOSIS — Z603 Acculturation difficulty: Secondary | ICD-10-CM

## 2023-06-18 DIAGNOSIS — O24410 Gestational diabetes mellitus in pregnancy, diet controlled: Secondary | ICD-10-CM

## 2023-06-18 DIAGNOSIS — O99012 Anemia complicating pregnancy, second trimester: Secondary | ICD-10-CM

## 2023-06-18 DIAGNOSIS — O24415 Gestational diabetes mellitus in pregnancy, controlled by oral hypoglycemic drugs: Secondary | ICD-10-CM

## 2023-06-18 DIAGNOSIS — O09522 Supervision of elderly multigravida, second trimester: Secondary | ICD-10-CM

## 2023-06-18 DIAGNOSIS — Z3009 Encounter for other general counseling and advice on contraception: Secondary | ICD-10-CM

## 2023-06-18 DIAGNOSIS — O99210 Obesity complicating pregnancy, unspecified trimester: Secondary | ICD-10-CM

## 2023-06-18 DIAGNOSIS — Z641 Problems related to multiparity: Secondary | ICD-10-CM

## 2023-06-18 MED ORDER — METFORMIN HCL 500 MG PO TABS
1000.0000 mg | ORAL_TABLET | Freq: Two times a day (BID) | ORAL | 5 refills | Status: AC
Start: 2023-06-18 — End: ?

## 2023-06-18 NOTE — Patient Instructions (Signed)

## 2023-06-18 NOTE — Progress Notes (Signed)
Subjective:  [name] is a 42 y.o. [id number] at [redacted]w[redacted]d being seen today for ongoing prenatal care.  She is currently monitored for the following issues for this high-risk pregnancy and has Polyhydramnios in third trimester; Obesity in pregnancy; Language barrier; AMA (advanced maternal age) multigravida 35+, second trimester; Supervision of high risk pregnancy, antepartum; GDM at 20 weeks; Anemia in pregnancy, second trimester; BMI 40.0-44.9, adult (HCC); Grand multiparity; Late prenatal care affecting pregnancy in second trimester; Depression during pregnancy, antepartum; and Unwanted fertility on their problem list.  Patient reports no complaints.  Contractions: Irritability. Vag. Bleeding: None.  Movement: Present. Denies leaking of fluid.   The following portions of the patient's history were reviewed and updated as appropriate: allergies, current medications, past family history, past medical history, past social history, past surgical history and problem list. Problem list updated.  Objective:   Vitals:   06/18/23 1328  BP: 105/73  Pulse: 99  Weight: 239 lb 3.2 oz (108.5 kg)    Fetal Status: Fetal Heart Rate (bpm): 156   Movement: Present  Presentation: Vertex (bedside US)  General:  Alert, oriented and cooperative. Patient is in no acute distress.  Skin: Skin is warm and dry. No rash noted.   Cardiovascular: Normal heart rate noted  Respiratory: Normal respiratory effort, no problems with respiration noted  Abdomen: Soft, gravid, appropriate for gestational age. Pain/Pressure: Present     Pelvic: Vag. Bleeding: None     Cervical exam performed Dilation: Closed   Station: Ballotable  Extremities: Normal range of motion.  Edema: Mild pitting, slight indentation  Mental Status: Normal mood and affect. Normal behavior. Normal judgment and thought content.   Urinalysis:        Assessment and Plan:  Pregnancy: [id number] at [redacted]w[redacted]d  1. Supervision of high risk  pregnancy, antepartum BP and FHR normal Swabs collected Cervix still closed, unsure presentation but confirmed vertex on bedside US - GC/Chlamydia probe amp ([place])not at [facility] - Culture, beta strep (group b only)  2. Unwanted fertility Discussed postpartum BTL unrealistic for financial reasons unless she has cesarean which is unlikely Planning for nexplanon outpatient  3. Obesity in pregnancy   4. Language barrier spanish  5. Grand multiparity TXA at delivery  6. Gestational diabetes mellitus (GDM) in third trimester controlled on oral hypoglycemic drug Sugars 100% not at goal, see photo Increase metformin to 1g BID Went to [place] for an [place] two days ago but did not have any money so they did not complete it She has not been engaged in antenatal testing, unfortunately not able to get her one today I did do a bedside US and noted poly with a single MVP of >9 cm, breathing movements as well as fetal movement was also seen Given poorly controlled sugars with poly on US exam and possible macrosomia, scheduled for IOL at 37w on 06/21/2023 Form faxed, orders placed  7. Anemia in pregnancy, second trimester Lab Results  Component Value Date   HGB 8.5 (L) 05/25/2023   No time at present for IV iron, taking PO iron Follow up PP TXA at time of delivery  8. AMA (advanced maternal age) multigravida 35+, second trimester On ASA  9. Polyhydramnios Noted on bedside US with MVP >9 cm  Preterm labor symptoms and general obstetric precautions including but not limited to vaginal bleeding, contractions, leaking of fluid and fetal movement were reviewed in detail with the patient. Please refer to After Visit Summary for other counseling recommendations.  Return in  1 week (on 06/25/2023) for [facility], ob visit.   Venora Maples, MD

## 2023-06-19 ENCOUNTER — Other Ambulatory Visit: Payer: Self-pay | Admitting: Advanced Practice Midwife

## 2023-06-20 LAB — GC/CHLAMYDIA PROBE AMP (~~LOC~~) NOT AT ARMC
Chlamydia: NEGATIVE
Comment: NEGATIVE
Comment: NORMAL
Neisseria Gonorrhea: NEGATIVE

## 2023-06-21 ENCOUNTER — Encounter (HOSPITAL_COMMUNITY): Payer: Self-pay | Admitting: Family Medicine

## 2023-06-21 ENCOUNTER — Inpatient Hospital Stay (HOSPITAL_COMMUNITY): Payer: Medicaid Other

## 2023-06-21 ENCOUNTER — Other Ambulatory Visit: Payer: Self-pay

## 2023-06-21 ENCOUNTER — Encounter (HOSPITAL_COMMUNITY)
Admission: RE | Disposition: A | Discharge status: Home / Self Care | Payer: Self-pay | Source: Home / Self Care | Attending: Family Medicine

## 2023-06-21 ENCOUNTER — Inpatient Hospital Stay (HOSPITAL_COMMUNITY): Payer: Medicaid Other | Admitting: Certified Registered Nurse Anesthetist

## 2023-06-21 ENCOUNTER — Inpatient Hospital Stay (HOSPITAL_COMMUNITY)
Admission: RE | Admit: 2023-06-21 | Discharge: 2023-06-24 | DRG: 784 | Disposition: A | Discharge status: Home / Self Care | Payer: Medicaid Other | Attending: Family Medicine | Admitting: Family Medicine

## 2023-06-21 DIAGNOSIS — Z7982 Long term (current) use of aspirin: Secondary | ICD-10-CM | POA: Diagnosis not present

## 2023-06-21 DIAGNOSIS — Z3009 Encounter for other general counseling and advice on contraception: Secondary | ICD-10-CM | POA: Diagnosis present

## 2023-06-21 DIAGNOSIS — Z302 Encounter for sterilization: Secondary | ICD-10-CM

## 2023-06-21 DIAGNOSIS — O09523 Supervision of elderly multigravida, third trimester: Secondary | ICD-10-CM

## 2023-06-21 DIAGNOSIS — O0932 Supervision of pregnancy with insufficient antenatal care, second trimester: Secondary | ICD-10-CM

## 2023-06-21 DIAGNOSIS — Z5941 Food insecurity: Secondary | ICD-10-CM | POA: Diagnosis not present

## 2023-06-21 DIAGNOSIS — O99214 Obesity complicating childbirth: Secondary | ICD-10-CM | POA: Diagnosis present

## 2023-06-21 DIAGNOSIS — E7879 Other disorders of bile acid and cholesterol metabolism: Secondary | ICD-10-CM | POA: Diagnosis present

## 2023-06-21 DIAGNOSIS — O9081 Anemia of the puerperium: Secondary | ICD-10-CM | POA: Diagnosis not present

## 2023-06-21 DIAGNOSIS — O9962 Diseases of the digestive system complicating childbirth: Secondary | ICD-10-CM | POA: Diagnosis present

## 2023-06-21 DIAGNOSIS — O24419 Gestational diabetes mellitus in pregnancy, unspecified control: Secondary | ICD-10-CM | POA: Diagnosis present

## 2023-06-21 DIAGNOSIS — O24425 Gestational diabetes mellitus in childbirth, controlled by oral hypoglycemic drugs: Secondary | ICD-10-CM | POA: Diagnosis present

## 2023-06-21 DIAGNOSIS — O3663X0 Maternal care for excessive fetal growth, third trimester, not applicable or unspecified: Secondary | ICD-10-CM

## 2023-06-21 DIAGNOSIS — K219 Gastro-esophageal reflux disease without esophagitis: Secondary | ICD-10-CM | POA: Diagnosis present

## 2023-06-21 DIAGNOSIS — Z9151 Personal history of suicidal behavior: Secondary | ICD-10-CM | POA: Diagnosis not present

## 2023-06-21 DIAGNOSIS — O09529 Supervision of elderly multigravida, unspecified trimester: Secondary | ICD-10-CM

## 2023-06-21 DIAGNOSIS — O403XX0 Polyhydramnios, third trimester, not applicable or unspecified: Secondary | ICD-10-CM

## 2023-06-21 DIAGNOSIS — Z5982 Transportation insecurity: Secondary | ICD-10-CM | POA: Diagnosis not present

## 2023-06-21 DIAGNOSIS — K7689 Other specified diseases of liver: Secondary | ICD-10-CM | POA: Diagnosis present

## 2023-06-21 DIAGNOSIS — F32A Depression, unspecified: Secondary | ICD-10-CM | POA: Diagnosis present

## 2023-06-21 DIAGNOSIS — Z885 Allergy status to narcotic agent status: Secondary | ICD-10-CM | POA: Diagnosis not present

## 2023-06-21 DIAGNOSIS — O24415 Gestational diabetes mellitus in pregnancy, controlled by oral hypoglycemic drugs: Secondary | ICD-10-CM

## 2023-06-21 DIAGNOSIS — O24424 Gestational diabetes mellitus in childbirth, insulin controlled: Secondary | ICD-10-CM

## 2023-06-21 DIAGNOSIS — Z3A37 37 weeks gestation of pregnancy: Secondary | ICD-10-CM | POA: Diagnosis not present

## 2023-06-21 DIAGNOSIS — Z560 Unemployment, unspecified: Secondary | ICD-10-CM | POA: Diagnosis not present

## 2023-06-21 DIAGNOSIS — O99344 Other mental disorders complicating childbirth: Secondary | ICD-10-CM | POA: Diagnosis not present

## 2023-06-21 DIAGNOSIS — O99013 Anemia complicating pregnancy, third trimester: Secondary | ICD-10-CM | POA: Diagnosis present

## 2023-06-21 DIAGNOSIS — D62 Acute posthemorrhagic anemia: Secondary | ICD-10-CM | POA: Diagnosis not present

## 2023-06-21 DIAGNOSIS — O0943 Supervision of pregnancy with grand multiparity, third trimester: Secondary | ICD-10-CM

## 2023-06-21 DIAGNOSIS — Z758 Other problems related to medical facilities and other health care: Secondary | ICD-10-CM | POA: Diagnosis present

## 2023-06-21 DIAGNOSIS — O4443 Low lying placenta NOS or without hemorrhage, third trimester: Secondary | ICD-10-CM | POA: Diagnosis not present

## 2023-06-21 DIAGNOSIS — O0990 Supervision of high risk pregnancy, unspecified, unspecified trimester: Principal | ICD-10-CM

## 2023-06-21 DIAGNOSIS — O26643 Intrahepatic cholestasis of pregnancy, third trimester: Secondary | ICD-10-CM | POA: Diagnosis present

## 2023-06-21 DIAGNOSIS — Z825 Family history of asthma and other chronic lower respiratory diseases: Secondary | ICD-10-CM

## 2023-06-21 DIAGNOSIS — O3663X1 Maternal care for excessive fetal growth, third trimester, fetus 1: Secondary | ICD-10-CM | POA: Diagnosis present

## 2023-06-21 DIAGNOSIS — Z8632 Personal history of gestational diabetes: Secondary | ICD-10-CM | POA: Diagnosis present

## 2023-06-21 DIAGNOSIS — Z9851 Tubal ligation status: Secondary | ICD-10-CM | POA: Diagnosis present

## 2023-06-21 DIAGNOSIS — Z603 Acculturation difficulty: Secondary | ICD-10-CM | POA: Diagnosis present

## 2023-06-21 DIAGNOSIS — O99340 Other mental disorders complicating pregnancy, unspecified trimester: Secondary | ICD-10-CM | POA: Diagnosis present

## 2023-06-21 DIAGNOSIS — O99213 Obesity complicating pregnancy, third trimester: Secondary | ICD-10-CM

## 2023-06-21 DIAGNOSIS — Z641 Problems related to multiparity: Secondary | ICD-10-CM

## 2023-06-21 LAB — CBC
HCT: 24.8 % — ABNORMAL LOW (ref 36.0–46.0)
HCT: 25.5 % — ABNORMAL LOW (ref 36.0–46.0)
HCT: 26.8 % — ABNORMAL LOW (ref 36.0–46.0)
Hemoglobin: 7.7 g/dL — ABNORMAL LOW (ref 12.0–15.0)
Hemoglobin: 8 g/dL — ABNORMAL LOW (ref 12.0–15.0)
Hemoglobin: 8.8 g/dL — ABNORMAL LOW (ref 12.0–15.0)
MCH: 24.9 pg — ABNORMAL LOW (ref 26.0–34.0)
MCH: 24.9 pg — ABNORMAL LOW (ref 26.0–34.0)
MCH: 26.3 pg (ref 26.0–34.0)
MCHC: 31 g/dL (ref 30.0–36.0)
MCHC: 31.4 g/dL (ref 30.0–36.0)
MCHC: 32.8 g/dL (ref 30.0–36.0)
MCV: 80.3 fL (ref 80.0–100.0)
MCV: 79.4 fL — ABNORMAL LOW (ref 80.0–100.0)
MCV: 80 fL (ref 80.0–100.0)
Platelets: 306 10*3/uL (ref 150–400)
Platelets: 267 10*3/uL (ref 150–400)
Platelets: 250 10*3/uL (ref 150–400)
RBC: 3.09 MIL/uL — ABNORMAL LOW (ref 3.87–5.11)
RBC: 3.21 MIL/uL — ABNORMAL LOW (ref 3.87–5.11)
RBC: 3.35 MIL/uL — ABNORMAL LOW (ref 3.87–5.11)
RDW: 14.7 % (ref 11.5–15.5)
RDW: 15 % (ref 11.5–15.5)
RDW: 14.9 % (ref 11.5–15.5)
WBC: 8.2 10*3/uL (ref 4.0–10.5)
WBC: 7 10*3/uL (ref 4.0–10.5)
WBC: 10.9 10*3/uL — ABNORMAL HIGH (ref 4.0–10.5)
nRBC: 0 % (ref 0.0–0.2)
nRBC: 0 % (ref 0.0–0.2)
nRBC: 0 % (ref 0.0–0.2)

## 2023-06-21 LAB — RPR: RPR Ser Ql: NONREACTIVE

## 2023-06-21 LAB — CULTURE, BETA STREP (GROUP B ONLY): Strep Gp B Culture: NEGATIVE

## 2023-06-21 LAB — GLUCOSE, CAPILLARY
Glucose-Capillary: 100 mg/dL — ABNORMAL HIGH (ref 70–99)
Glucose-Capillary: 80 mg/dL (ref 70–99)
Glucose-Capillary: 79 mg/dL (ref 70–99)
Glucose-Capillary: 99 mg/dL (ref 70–99)
Glucose-Capillary: 103 mg/dL — ABNORMAL HIGH (ref 70–99)

## 2023-06-21 LAB — PREPARE RBC (CROSSMATCH): Order Confirmation: POSITIVE

## 2023-06-21 LAB — GROUP B STREP BY PCR: Group B strep by PCR: NEGATIVE

## 2023-06-21 SURGERY — Surgical Case
Anesthesia: Spinal

## 2023-06-21 MED ORDER — HYDROMORPHONE HCL 1 MG/ML IJ SOLN
0.2500 mg | INTRAMUSCULAR | Status: DC | PRN
  Administered 2023-06-21: 0.5 mg via INTRAVENOUS
  Administered 2023-06-21: 0.25 mg via INTRAVENOUS

## 2023-06-21 MED ORDER — TRANEXAMIC ACID-NACL 1000-0.7 MG/100ML-% IV SOLN
1000.0000 mg | Freq: Once | INTRAVENOUS | Status: AC
  Administered 2023-06-21: 1000 mg via INTRAVENOUS

## 2023-06-21 MED ORDER — PRENATAL MULTIVITAMIN CH
1.0000 | ORAL_TABLET | Freq: Every day | ORAL | Status: DC
  Administered 2023-06-22 – 2023-06-24 (×3): 1 via ORAL
  Filled 2023-06-21 (×3): qty 1

## 2023-06-21 MED ORDER — PHENYLEPHRINE HCL (PRESSORS) 10 MG/ML IV SOLN
INTRAVENOUS | Status: DC | PRN
Start: 2023-06-21 — End: 2023-06-21
  Administered 2023-06-21 (×2): 80 ug via INTRAVENOUS

## 2023-06-21 MED ORDER — SODIUM CHLORIDE 0.9 % IV SOLN
INTRAVENOUS | Status: DC | PRN
Start: 2023-06-21 — End: 2023-06-21

## 2023-06-21 MED ORDER — FENTANYL CITRATE (PF) 100 MCG/2ML IJ SOLN
50.0000 ug | INTRAMUSCULAR | Status: DC | PRN
  Administered 2023-06-21: 100 ug via INTRAVENOUS
  Filled 2023-06-21: qty 2

## 2023-06-21 MED ORDER — BISACODYL 10 MG RE SUPP: 10.0000 mg | Freq: Every day | RECTAL | Status: DC | PRN

## 2023-06-21 MED ORDER — PHENYLEPHRINE HCL-NACL 20-0.9 MG/250ML-% IV SOLN
INTRAVENOUS | Status: AC
  Filled 2023-06-21: qty 250

## 2023-06-21 MED ORDER — OXYCODONE HCL 5 MG/5ML PO SOLN: 5.0000 mg | Freq: Once | ORAL | Status: DC | PRN

## 2023-06-21 MED ORDER — SODIUM CHLORIDE 0.9 % IV SOLN: 12.5000 mg | INTRAVENOUS | Status: DC | PRN

## 2023-06-21 MED ORDER — OXYTOCIN-SODIUM CHLORIDE 30-0.9 UT/500ML-% IV SOLN: 1.0000 m[IU]/min | INTRAVENOUS | Status: DC

## 2023-06-21 MED ORDER — ALBUMIN HUMAN 5 % IV SOLN
INTRAVENOUS | Status: DC | PRN
Start: 2023-06-21 — End: 2023-06-21

## 2023-06-21 MED ORDER — TERBUTALINE SULFATE 1 MG/ML IJ SOLN
0.2500 mg | Freq: Once | INTRAMUSCULAR | Status: AC | PRN
  Administered 2023-06-21: 0.25 mg via SUBCUTANEOUS

## 2023-06-21 MED ORDER — DIPHENHYDRAMINE HCL 25 MG PO CAPS
25.0000 mg | ORAL_CAPSULE | Freq: Once | ORAL | Status: AC
  Administered 2023-06-21: 25 mg via ORAL
  Filled 2023-06-21: qty 1

## 2023-06-21 MED ORDER — DIPHENHYDRAMINE HCL 50 MG/ML IJ SOLN: 12.5000 mg | INTRAMUSCULAR | Status: DC | PRN

## 2023-06-21 MED ORDER — DEXMEDETOMIDINE HCL IN NACL 80 MCG/20ML IV SOLN
INTRAVENOUS | Status: DC | PRN
Start: 2023-06-21 — End: 2023-06-21
  Administered 2023-06-21: 4 ug via INTRAVENOUS

## 2023-06-21 MED ORDER — HYDROMORPHONE HCL 1 MG/ML IJ SOLN
INTRAMUSCULAR | Status: AC
  Filled 2023-06-21: qty 0.5

## 2023-06-21 MED ORDER — TRANEXAMIC ACID-NACL 1000-0.7 MG/100ML-% IV SOLN
1000.0000 mg | INTRAVENOUS | Status: AC
  Administered 2023-06-21: 1000 mg via INTRAVENOUS

## 2023-06-21 MED ORDER — ZOLPIDEM TARTRATE 5 MG PO TABS: 5.0000 mg | ORAL_TABLET | Freq: Every evening | ORAL | Status: DC | PRN

## 2023-06-21 MED ORDER — OXYCODONE HCL 5 MG PO TABS: 5.0000 mg | ORAL_TABLET | Freq: Once | ORAL | Status: DC | PRN

## 2023-06-21 MED ORDER — LIDOCAINE HCL (PF) 1 % IJ SOLN: 30.0000 mL | INTRAMUSCULAR | Status: DC | PRN

## 2023-06-21 MED ORDER — NALOXONE HCL 0.4 MG/ML IJ SOLN: 0.4000 mg | INTRAMUSCULAR | Status: DC | PRN

## 2023-06-21 MED ORDER — MENTHOL 3 MG MT LOZG: 1.0000 | LOZENGE | OROMUCOSAL | Status: DC | PRN

## 2023-06-21 MED ORDER — OXYTOCIN-SODIUM CHLORIDE 30-0.9 UT/500ML-% IV SOLN
2.5000 [IU]/h | INTRAVENOUS | Status: AC
  Administered 2023-06-22: 2.5 [IU]/h via INTRAVENOUS
  Filled 2023-06-21: qty 500

## 2023-06-21 MED ORDER — BUPIVACAINE IN DEXTROSE 0.75-8.25 % IT SOLN
INTRATHECAL | Status: DC | PRN
  Administered 2023-06-21: 1.6 mL via INTRATHECAL

## 2023-06-21 MED ORDER — TRANEXAMIC ACID-NACL 1000-0.7 MG/100ML-% IV SOLN
INTRAVENOUS | Status: DC | PRN
Start: 2023-06-21 — End: 2023-06-21
  Administered 2023-06-21: 1000 mg via INTRAVENOUS

## 2023-06-21 MED ORDER — CEFAZOLIN SODIUM-DEXTROSE 2-4 GM/100ML-% IV SOLN: 2.0000 g | Freq: Once | INTRAVENOUS | Status: DC

## 2023-06-21 MED ORDER — OXYTOCIN-SODIUM CHLORIDE 30-0.9 UT/500ML-% IV SOLN: 2.5000 [IU]/h | INTRAVENOUS | Status: DC

## 2023-06-21 MED ORDER — ALBUTEROL SULFATE (2.5 MG/3ML) 0.083% IN NEBU: 2.5000 mg | INHALATION_SOLUTION | Freq: Four times a day (QID) | RESPIRATORY_TRACT | Status: DC | PRN

## 2023-06-21 MED ORDER — TRANEXAMIC ACID-NACL 1000-0.7 MG/100ML-% IV SOLN
INTRAVENOUS | Status: AC
  Filled 2023-06-21: qty 100

## 2023-06-21 MED ORDER — STERILE WATER FOR IRRIGATION IR SOLN
Status: DC | PRN
  Administered 2023-06-21: 1000 mL

## 2023-06-21 MED ORDER — DIPHENHYDRAMINE HCL 50 MG/ML IJ SOLN
12.5000 mg | INTRAMUSCULAR | Status: DC | PRN
  Administered 2023-06-21: 12.5 mg via INTRAVENOUS

## 2023-06-21 MED ORDER — ONDANSETRON HCL 4 MG/2ML IJ SOLN
INTRAMUSCULAR | Status: DC | PRN
  Administered 2023-06-21: 4 mg via INTRAVENOUS

## 2023-06-21 MED ORDER — SIMETHICONE 80 MG PO CHEW
80.0000 mg | CHEWABLE_TABLET | Freq: Three times a day (TID) | ORAL | Status: DC
  Administered 2023-06-22 – 2023-06-23 (×5): 80 mg via ORAL
  Filled 2023-06-21 (×6): qty 1

## 2023-06-21 MED ORDER — ONDANSETRON HCL 4 MG/2ML IJ SOLN: 4.0000 mg | Freq: Four times a day (QID) | INTRAMUSCULAR | Status: DC | PRN

## 2023-06-21 MED ORDER — LACTATED RINGERS IV SOLN: 500.0000 mL | Freq: Once | INTRAVENOUS | Status: DC

## 2023-06-21 MED ORDER — ACETAMINOPHEN 500 MG PO TABS
1000.0000 mg | ORAL_TABLET | Freq: Four times a day (QID) | ORAL | Status: DC
  Administered 2023-06-22 – 2023-06-24 (×10): 1000 mg via ORAL
  Filled 2023-06-21 (×10): qty 2

## 2023-06-21 MED ORDER — CEFAZOLIN SODIUM-DEXTROSE 2-4 GM/100ML-% IV SOLN
INTRAVENOUS | Status: AC
  Filled 2023-06-21: qty 100

## 2023-06-21 MED ORDER — DIBUCAINE (PERIANAL) 1 % EX OINT: 1.0000 | TOPICAL_OINTMENT | CUTANEOUS | Status: DC | PRN

## 2023-06-21 MED ORDER — LACTATED RINGERS IV SOLN: 500.0000 mL | INTRAVENOUS | Status: DC | PRN

## 2023-06-21 MED ORDER — OXYTOCIN-SODIUM CHLORIDE 30-0.9 UT/500ML-% IV SOLN
INTRAVENOUS | Status: DC | PRN
  Administered 2023-06-21: 300 mL via INTRAVENOUS

## 2023-06-21 MED ORDER — PRENATAL 27-0.8 MG PO TABS: 1.0000 | ORAL_TABLET | Freq: Every day | ORAL | Status: DC

## 2023-06-21 MED ORDER — MISOPROSTOL 25 MCG QUARTER TABLET
25.0000 ug | ORAL_TABLET | Freq: Once | ORAL | Status: AC
  Administered 2023-06-21: 25 ug via VAGINAL
  Filled 2023-06-21: qty 1

## 2023-06-21 MED ORDER — EPHEDRINE 5 MG/ML INJ: 10.0000 mg | INTRAVENOUS | Status: DC | PRN

## 2023-06-21 MED ORDER — SODIUM CHLORIDE 0.9% IV SOLUTION: Freq: Once | INTRAVENOUS | Status: AC

## 2023-06-21 MED ORDER — SOD CITRATE-CITRIC ACID 500-334 MG/5ML PO SOLN
30.0000 mL | ORAL | Status: DC | PRN
  Administered 2023-06-21: 30 mL via ORAL
  Filled 2023-06-21: qty 30

## 2023-06-21 MED ORDER — MORPHINE SULFATE (PF) 0.5 MG/ML IJ SOLN
INTRAMUSCULAR | Status: DC | PRN
  Administered 2023-06-21: .15 mg via INTRATHECAL

## 2023-06-21 MED ORDER — SENNOSIDES-DOCUSATE SODIUM 8.6-50 MG PO TABS
2.0000 | ORAL_TABLET | ORAL | Status: DC
  Administered 2023-06-22 – 2023-06-23 (×2): 2 via ORAL
  Filled 2023-06-21 (×3): qty 2

## 2023-06-21 MED ORDER — NIFEDIPINE 10 MG PO CAPS
10.0000 mg | ORAL_CAPSULE | Freq: Once | ORAL | Status: AC
  Administered 2023-06-21: 10 mg via ORAL
  Filled 2023-06-21: qty 1

## 2023-06-21 MED ORDER — FERROUS SULFATE 325 (65 FE) MG PO TABS
325.0000 mg | ORAL_TABLET | ORAL | Status: DC
  Administered 2023-06-22 – 2023-06-24 (×2): 325 mg via ORAL
  Filled 2023-06-21 (×2): qty 1

## 2023-06-21 MED ORDER — MISOPROSTOL 50MCG HALF TABLET
50.0000 ug | ORAL_TABLET | Freq: Once | ORAL | Status: AC
  Administered 2023-06-21: 50 ug via ORAL
  Filled 2023-06-21: qty 1

## 2023-06-21 MED ORDER — SODIUM CHLORIDE 0.9 % IR SOLN
Status: DC | PRN
  Administered 2023-06-21: 1

## 2023-06-21 MED ORDER — COCONUT OIL OIL: 1.0000 | TOPICAL_OIL | Status: DC | PRN

## 2023-06-21 MED ORDER — LACTATED RINGERS IV SOLN: INTRAVENOUS | Status: DC

## 2023-06-21 MED ORDER — PHENYLEPHRINE HCL-NACL 20-0.9 MG/250ML-% IV SOLN
INTRAVENOUS | Status: DC | PRN
  Administered 2023-06-21: 60 ug/min via INTRAVENOUS

## 2023-06-21 MED ORDER — SIMETHICONE 80 MG PO CHEW: 80.0000 mg | CHEWABLE_TABLET | ORAL | Status: DC | PRN

## 2023-06-21 MED ORDER — LACTATED RINGERS IV SOLN
INTRAVENOUS | Status: DC | PRN
Start: 2023-06-21 — End: 2023-06-21

## 2023-06-21 MED ORDER — OXYTOCIN BOLUS FROM INFUSION: 333.0000 mL | Freq: Once | INTRAVENOUS | Status: DC

## 2023-06-21 MED ORDER — OXYCODONE HCL 5 MG PO TABS
5.0000 mg | ORAL_TABLET | ORAL | Status: DC | PRN
  Administered 2023-06-24 (×2): 5 mg via ORAL
  Administered 2023-06-24: 10 mg via ORAL
  Filled 2023-06-21: qty 1
  Filled 2023-06-21: qty 2
  Filled 2023-06-21: qty 1

## 2023-06-21 MED ORDER — MORPHINE SULFATE (PF) 0.5 MG/ML IJ SOLN
INTRAMUSCULAR | Status: AC
  Filled 2023-06-21: qty 10

## 2023-06-21 MED ORDER — ONDANSETRON HCL 4 MG/2ML IJ SOLN
INTRAMUSCULAR | Status: AC
  Filled 2023-06-21: qty 2

## 2023-06-21 MED ORDER — FENTANYL CITRATE (PF) 100 MCG/2ML IJ SOLN
INTRAMUSCULAR | Status: AC
  Filled 2023-06-21: qty 2

## 2023-06-21 MED ORDER — CEFAZOLIN SODIUM-DEXTROSE 2-3 GM-%(50ML) IV SOLR
INTRAVENOUS | Status: DC | PRN
Start: 2023-06-21 — End: 2023-06-21
  Administered 2023-06-21 (×2): 2 g via INTRAVENOUS

## 2023-06-21 MED ORDER — FENTANYL-BUPIVACAINE-NACL 0.5-0.125-0.9 MG/250ML-% EP SOLN: 12.0000 mL/h | EPIDURAL | Status: DC | PRN

## 2023-06-21 MED ORDER — OXYTOCIN-SODIUM CHLORIDE 30-0.9 UT/500ML-% IV SOLN
INTRAVENOUS | Status: AC
  Filled 2023-06-21: qty 500

## 2023-06-21 MED ORDER — DIPHENHYDRAMINE HCL 25 MG PO CAPS
25.0000 mg | ORAL_CAPSULE | Freq: Four times a day (QID) | ORAL | Status: DC | PRN
  Administered 2023-06-22: 25 mg via ORAL

## 2023-06-21 MED ORDER — SODIUM CHLORIDE 0.9% IV SOLUTION: Freq: Once | INTRAVENOUS | Status: DC

## 2023-06-21 MED ORDER — TERBUTALINE SULFATE 1 MG/ML IJ SOLN
0.2500 mg | Freq: Once | INTRAMUSCULAR | Status: AC | PRN
  Administered 2023-06-21: 0.25 mg via SUBCUTANEOUS
  Filled 2023-06-21: qty 1

## 2023-06-21 MED ORDER — TETANUS-DIPHTH-ACELL PERTUSSIS 5-2.5-18.5 LF-MCG/0.5 IM SUSY: 0.5000 mL | PREFILLED_SYRINGE | Freq: Once | INTRAMUSCULAR | Status: DC

## 2023-06-21 MED ORDER — KETOROLAC TROMETHAMINE 30 MG/ML IJ SOLN: 30.0000 mg | Freq: Once | INTRAMUSCULAR | Status: DC | PRN

## 2023-06-21 MED ORDER — WITCH HAZEL-GLYCERIN EX PADS: 1.0000 | MEDICATED_PAD | CUTANEOUS | Status: DC | PRN

## 2023-06-21 MED ORDER — PHENYLEPHRINE 80 MCG/ML (10ML) SYRINGE FOR IV PUSH (FOR BLOOD PRESSURE SUPPORT): 80.0000 ug | PREFILLED_SYRINGE | INTRAVENOUS | Status: DC | PRN

## 2023-06-21 MED ORDER — DIPHENHYDRAMINE HCL 25 MG PO CAPS
25.0000 mg | ORAL_CAPSULE | ORAL | Status: DC | PRN
  Filled 2023-06-21: qty 1

## 2023-06-21 MED ORDER — MEPERIDINE HCL 25 MG/ML IJ SOLN: 6.2500 mg | INTRAMUSCULAR | Status: DC | PRN

## 2023-06-21 MED ORDER — ACETAMINOPHEN 10 MG/ML IV SOLN
INTRAVENOUS | Status: AC
  Filled 2023-06-21: qty 100

## 2023-06-21 MED ORDER — NALOXONE HCL 4 MG/10ML IJ SOLN: 1.0000 ug/kg/h | INTRAVENOUS | Status: DC | PRN

## 2023-06-21 MED ORDER — SODIUM CHLORIDE 0.9% FLUSH: 3.0000 mL | INTRAVENOUS | Status: DC | PRN

## 2023-06-21 MED ORDER — ACETAMINOPHEN 325 MG PO TABS
650.0000 mg | ORAL_TABLET | ORAL | Status: DC | PRN
  Administered 2023-06-21 (×2): 650 mg via ORAL
  Filled 2023-06-21 (×2): qty 2

## 2023-06-21 MED ORDER — ACETAMINOPHEN 10 MG/ML IV SOLN
INTRAVENOUS | Status: DC | PRN
Start: 2023-06-21 — End: 2023-06-21
  Administered 2023-06-21: 1000 mg via INTRAVENOUS

## 2023-06-21 MED ORDER — FENTANYL CITRATE (PF) 100 MCG/2ML IJ SOLN
INTRAMUSCULAR | Status: DC | PRN
  Administered 2023-06-21: 15 ug via INTRATHECAL

## 2023-06-21 SURGICAL SUPPLY — 40 items
ADH SKN CLS APL DERMABOND .7 (GAUZE/BANDAGES/DRESSINGS) ×2
APL PRP STRL LF DISP 70% ISPRP (MISCELLANEOUS) ×2
CATH ROBINSON RED A/P 16FR (CATHETERS) IMPLANT
CHLORAPREP W/TINT 26 (MISCELLANEOUS) ×2 IMPLANT
CHLORAPREP W/TINT 26ML (MISCELLANEOUS) ×2 IMPLANT
CLAMP UMBILICAL CORD (MISCELLANEOUS) ×2 IMPLANT
CLOTH BEACON ORANGE TIMEOUT ST (SAFETY) ×2 IMPLANT
DERMABOND ADVANCED .7 DNX12 (GAUZE/BANDAGES/DRESSINGS) ×4 IMPLANT
DRSG OPSITE POSTOP 4X10 (GAUZE/BANDAGES/DRESSINGS) ×2 IMPLANT
ELECT REM PT RETURN 9FT ADLT (ELECTROSURGICAL) ×2
ELECTRODE REM PT RTRN 9FT ADLT (ELECTROSURGICAL) ×2 IMPLANT
EXTRACTOR VACUUM M CUP 4 TUBE (SUCTIONS) IMPLANT
GLOVE BIOGEL PI IND STRL 7.0 (GLOVE) ×3 IMPLANT
GLOVE BIOGEL PI IND STRL 7.5 (GLOVE) ×3 IMPLANT
GLOVE ECLIPSE 7.5 STRL STRAW (GLOVE) ×2 IMPLANT
GOWN STRL REUS W/TWL LRG LVL3 (GOWN DISPOSABLE) ×6 IMPLANT
KIT ABG SYR 3ML LUER SLIP (SYRINGE) IMPLANT
LIGASURE IMPACT 36 18CM CVD LR (INSTRUMENTS) IMPLANT
MAT PREVALON FULL STRYKER (MISCELLANEOUS) IMPLANT
NDL HYPO 25X5/8 SAFETYGLIDE (NEEDLE) IMPLANT
NEEDLE HYPO 25X5/8 SAFETYGLIDE (NEEDLE) IMPLANT
NS IRRIG 1000ML POUR BTL (IV SOLUTION) ×2 IMPLANT
PACK C SECTION WH (CUSTOM PROCEDURE TRAY) ×2 IMPLANT
PAD OB MATERNITY 4.3X12.25 (PERSONAL CARE ITEMS) ×2 IMPLANT
PENCIL SMOKE EVAC W/HOLSTER (ELECTROSURGICAL) ×1 IMPLANT
RETAINER VISCERAL (MISCELLANEOUS) IMPLANT
RETRACTOR TRAXI PANNICULUS (MISCELLANEOUS) IMPLANT
RTRCTR C-SECT PINK 25CM LRG (MISCELLANEOUS) ×2 IMPLANT
SUT MNCRL 0 VIOLET CTX 36 (SUTURE) ×4 IMPLANT
SUT PLAIN 2 0 (SUTURE) ×2
SUT PLAIN ABS 2-0 54XMFL TIE (SUTURE) ×1 IMPLANT
SUT PLAIN ABS 2-0 CT1 27XMFL (SUTURE) IMPLANT
SUT VIC AB 0 CTX 36 (SUTURE) ×2
SUT VIC AB 0 CTX36XBRD ANBCTRL (SUTURE) ×2 IMPLANT
SUT VIC AB 2-0 CT1 27 (SUTURE) ×2
SUT VIC AB 2-0 CT1 TAPERPNT 27 (SUTURE) ×2 IMPLANT
SUT VIC AB 4-0 KS 27 (SUTURE) ×2 IMPLANT
TOWEL OR 17X24 6PK STRL BLUE (TOWEL DISPOSABLE) ×2 IMPLANT
TRAY FOLEY W/BAG SLVR 14FR LF (SET/KITS/TRAYS/PACK) ×2 IMPLANT
WATER STERILE IRR 1000ML POUR (IV SOLUTION) ×2 IMPLANT

## 2023-06-21 NOTE — Discharge Summary (Signed)
Postpartum Discharge Summary     Patient Name: [name] DOB: July 24, 1981 MRN: [id number]  Date of admission: 06/21/2023 Delivery date:06/21/2023 Delivering provider: Levie Heritage Date of discharge: 06/24/2023  Admitting diagnosis: Gestational diabetes mellitus (GDM) [O24.419] Intrauterine pregnancy: [redacted]w[redacted]d     Secondary diagnosis:  Active Problems:   Polyhydramnios in third trimester   Maternal morbid obesity, antepartum (HCC)   Language barrier   AMA (advanced maternal age) multigravida 42+   Supervision of high risk pregnancy, antepartum   Gestational diabetes diagnosed at 20 weeks   Anemia in pregnancy, third trimester   Grand multiparity   Late prenatal care affecting pregnancy in second trimester   Depression during pregnancy, antepartum   Unwanted fertility   Large for gestational age fetus affecting management of mother, third trimester   Acute blood loss anemia   Postpartum hemorrhage  Additional problems: PPH, acute blood loss anemia on chronic anemia    Discharge diagnosis: Term Pregnancy Delivered                                              Post partum procedures:blood transfusion Augmentation: N/A Complications: Hemorrhage>1043mL  Hospital course: Sceduled C/S   42 y.o. yo [id number] at [redacted]w[redacted]d was admitted to the hospital 06/21/2023 for induction of labor due to uncontrolled A2 GDM with insufficient prenatal care. As a third trimester ultrasound was not preformed, one was performed at the hospital to discover fetal macrosomia and the patient was transitioned to a scheduled cesarean section with the following indication:Macrosomia. Delivery details are as follows:  Membrane Rupture Time/Date: 6:30 PM,06/21/2023  Delivery Method:C-Section, Low Transverse Operative Delivery:N/A Details of operation can be found in separate operative note.  Patient had a postpartum course complicated by postpartum hemorrhage.  She is ambulating, tolerating a regular  diet, passing flatus, and urinating well. Patient is discharged home in stable condition on  06/24/23        Newborn Data: Birth date:06/21/2023 Birth time:6:31 PM Gender:Female Living status:Living Apgars:8 ,9  Weight:4270 g    Magnesium Sulfate received: No BMZ received: No Rhophylac:N/A MMR:N/A T-DaP: offered Flu: No RSV Vaccine received: No Transfusion:Yes  Immunizations received: Immunization History  Administered Date(s) Administered   Influenza,inj,Quad PF,6+ Mos 05/30/2013, 09/27/2020   Influenza-Unspecified 09/30/2018   Pneumococcal Polysaccharide-23 02/10/2013   Tdap 02/26/2017, 09/08/2020    Physical exam  Vitals:   06/22/23 2024 06/23/23 0505 06/23/23 1407 06/23/23 1952  BP: 112/83 101/73 129/79 118/84  Pulse: 68 71 62 70  Resp: 16 16 18 18   Temp: 98.9 F (37.2 C) 98.6 F (37 C) 98.6 F (37 C) 99.2 F (37.3 C)  TempSrc: Oral Oral Oral Oral  SpO2: 98% 98% 100% 99%  Weight:      Height:       General: alert, cooperative, and no distress Lochia: appropriate Uterine Fundus: firm Incision: Healing well with no significant drainage, No significant erythema, Dressing is clean, dry, and intact DVT Evaluation: No evidence of DVT seen on physical exam. Labs: Lab Results  Component Value Date   WBC 9.4 06/22/2023   HGB 8.5 (L) 06/22/2023   HCT 26.3 (L) 06/22/2023   MCV 81.4 06/22/2023   PLT 240 06/22/2023      Latest Ref Rng & Units 06/22/2023    6:47 AM  CMP  Glucose 70 - 99 mg/dL 98   BUN 6 -  20 mg/dL 6   Creatinine 4.13 - 2.44 mg/dL 0.10   Sodium 272 - 536 mmol/L 136   Potassium 3.5 - 5.1 mmol/L 4.1   Chloride 98 - 111 mmol/L 107   CO2 22 - 32 mmol/L 18   Calcium 8.9 - 10.3 mg/dL 8.1    Edinburgh Score:    06/23/2023    5:55 AM  Edinburgh Postnatal Depression Scale Screening Tool  I have been able to laugh and see the funny side of things. 0  I have looked forward with enjoyment to things. 0  I have blamed myself unnecessarily when  things went wrong. 0  I have been anxious or worried for no good reason. 0  I have felt scared or panicky for no good reason. 0  Things have been getting on top of me. 0  I have been so unhappy that I have had difficulty sleeping. 0  I have felt sad or miserable. 0  I have been so unhappy that I have been crying. 0  The thought of harming myself has occurred to me. 0  Edinburgh Postnatal Depression Scale Total 0   Edinburgh Postnatal Depression Scale Total: 0   After visit meds:  Allergies as of 06/24/2023       Reactions   Hydrocodone-acetaminophen Itching   Has some itching on face & nose but not bad enough to stop taking it/ MD aware and instructed patient to take anti-allergy medication prn.        Medication List     STOP taking these medications    terconazole 0.8 % vaginal cream Commonly known as: TERAZOL 3       TAKE these medications    albuterol 108 (90 Base) MCG/ACT inhaler Commonly known as: VENTOLIN HFA Inhale 1-2 puffs into the lungs every 6 (six) hours as needed for wheezing or shortness of breath.   aspirin EC 81 MG tablet Take 1 tablet (81 mg total) by mouth daily.   ferrous sulfate 324 MG Tbec Take 1 tablet (324 mg total) by mouth every other day for 42 doses.   lidocaine 5 % Commonly known as: Lidoderm Place 1 patch onto the skin daily. Remove & Discard patch within 12 hours or as directed by MD   metFORMIN 500 MG tablet Commonly known as: GLUCOPHAGE Take 2 tablets (1,000 mg total) by mouth 2 (two) times daily with a meal.   multivitamin-prenatal 27-0.8 MG Tabs tablet Take 1 tablet by mouth daily at 12 noon.   oxyCODONE 5 MG immediate release tablet Commonly known as: Oxy IR/ROXICODONE Take 1-2 tablets (5-10 mg total) by mouth every 4 (four) hours as needed for moderate pain.   senna-docusate 8.6-50 MG tablet Commonly known as: Senokot-S Take 2 tablets by mouth 2 (two) times daily as needed for mild constipation or moderate  constipation.         Discharge home in stable condition Infant Feeding: Breast Infant Disposition:home with mother Discharge instruction: per After Visit Summary and Postpartum booklet. Activity: Advance as tolerated. Pelvic rest for 6 weeks.  Diet: carb modified diet Future Appointments:No future appointments. Follow up Visit:   Please schedule this patient for a In person postpartum visit in 4 weeks with the following provider: MD. Additional Postpartum F/U:2 hour GTT and Incision check 1 week  High risk pregnancy complicated by: GDM Delivery mode:  C-Section, Low Transverse Anticipated Birth Control:  BTL done [facility]   06/24/2023 Hessie Dibble, MD

## 2023-06-21 NOTE — H&P (Signed)
LABOR AND DELIVERY ADMISSION HISTORY AND PHYSICAL NOTE  [name] is a 42 y.o. female [phone] with IUP at [redacted]w[redacted]d presenting for IOL for uncontrolled A2GDM, poly.  Patient reports the fetal movement as active. Patient reports uterine contraction  activity as irregular. Patient reports  vaginal bleeding as none. Patient describes fluid per vagina as None.   Patient endorses headache, denies vision change., swelling, SOB, CP.  She plans on breast feeding feeding. Her contraception plan is:  Nexplanon OP .  Prenatal History/Complications: PNC at [facility] C/b GDM, uncontrolled Poly Anemia Depression Obesity  Sono:  @[redacted]w[redacted]d , CWD, normal anatomy, transverse left presentation, anterior low-lying placenta, >97%ile. Unable to further U/S due to financial constraints. Bedside 10/8 showed MVP >9 cm  Pregnancy complications:  Patient Active Problem List   Diagnosis Date Noted   Gestational diabetes mellitus (GDM) 06/21/2023   Unwanted fertility 05/23/2023   Depression during pregnancy, antepartum 05/14/2023   Late prenatal care affecting pregnancy in second trimester 03/07/2023   GDM at 20 weeks 03/04/2023   Anemia in pregnancy, second trimester 03/04/2023   BMI 40.0-44.9, adult (HCC) 03/04/2023   Grand multiparity 03/04/2023   Supervision of high risk pregnancy, antepartum 02/27/2023   AMA (advanced maternal age) multigravida 35+, second trimester 12/26/2018   Language barrier 11/21/2018   Obesity in pregnancy 11/07/2018   Polyhydramnios in third trimester 10/20/2018    Past Medical History: Past Medical History:  Diagnosis Date   AMA (advanced maternal age) multigravida 35+ 12/26/2018   Anxiety    relative to circumstances to back problems/injury, not taking any medicine, pt. reports has been crying a lot   Arthritis    low back   ASCUS of cervix with negative high risk HPV 10/20/2018   Asthma    Back pain    Cholelithiasis 04/08/2020   Chronic pain following surgery  or procedure 05/29/2013   Depression    Depression with suicidal ideation 05/29/2013   GERD (gastroesophageal reflux disease)    HNP (herniated nucleus pulposus), lumbar 02/04/2013   Maternal obesity affecting pregnancy, antepartum 09/27/2020   Migraines    Neck pain    PTSD (post-traumatic stress disorder) 05/29/2013   Suicide attempt by multiple drug overdose (HCC) 05/29/2013   Supervision of high risk pregnancy, antepartum 09/27/2020          Nursing Staff  Provider  Office Location   [facility] ([facility] from [facility])  Dating      Language   Spanish  Anatomy US   normal  Flu Vaccine   09/27/2020  Genetic Screen   NIPS: low risks  AFP:   First Screen:  Quad:      TDaP Vaccine    declines  Hgb A1C or   GTT  Early   Third trimester   COVID Vaccine  1st dose Nov 2021     LAB RESULTS   Rhogam      Blood Type    O pos  Feeding Plan  Breast   Antibody     Unstable lie of fetus 11/03/2020   Vaginal delivery 12/27/2018    Past Surgical History: Past Surgical History:  Procedure Laterality Date   childbirth     x4 vaginal    LUMBAR LAMINECTOMY/DECOMPRESSION MICRODISCECTOMY  06/05/2012   Procedure: LUMBAR LAMINECTOMY/DECOMPRESSION MICRODISCECTOMY;  Surgeon: Emilee Hero, MD;  Location: [facility] OR;  Service: Orthopedics;  Laterality: Left;  Left sided lumbar 4-5 microdisectomy    Obstetrical History: OB History     Gravida  10  Para  6   Term  6   Preterm  0   AB  3   Living  6      SAB  0   IAB  2   Ectopic  1   Multiple  0   Live Births  6           Social History: Social History   Socioeconomic History   Marital status: Significant Other    Spouse name: Not on file   Number of children: Not on file   Years of education: Not on file   Highest education level: Not on file  Occupational History   Not on file  Tobacco Use   Smoking status: Never   Smokeless tobacco: Never  Vaping Use   Vaping status: Never Used  Substance and Sexual Activity   Alcohol use: No   Drug  use: No   Sexual activity: Yes    Birth control/protection: None  Other Topics Concern   Not on file  Social History Narrative   Not on file   Social Determinants of Health   Financial Resource Strain: Not on file  Food Insecurity: Food Insecurity Present (06/21/2023)   Hunger Vital Sign    Worried About Running Out of Food in the Last Year: Sometimes true    Ran Out of Food in the Last Year: Sometimes true  Transportation Needs: Unmet Transportation Needs (06/21/2023)   PRAPARE - Administrator, Civil Service (Medical): Yes    Lack of Transportation (Non-Medical): Yes  Physical Activity: Not on file  Stress: Not on file  Social Connections: Not on file    Family History: Family History  Problem Relation Age of Onset   Asthma Brother     Allergies: Allergies  Allergen Reactions   Hydrocodone-Acetaminophen Itching    Has some itching on face & nose but not bad enough to stop taking it/ MD aware and instructed patient to take anti-allergy medication prn.    Medications Prior to Admission  Medication Sig Dispense Refill Last Dose   albuterol (VENTOLIN HFA) 108 (90 Base) MCG/ACT inhaler Inhale 1-2 puffs into the lungs every 6 (six) hours as needed for wheezing or shortness of breath. 18 g 0    aspirin EC 81 MG tablet Take 1 tablet (81 mg total) by mouth daily. 60 tablet 1    ferrous sulfate 324 MG TBEC Take 1 tablet (324 mg total) by mouth every other day for 42 doses. (Patient not taking: Reported on 05/14/2023) 42 tablet 0    lidocaine (LIDODERM) 5 % Place 1 patch onto the skin daily. Remove & Discard patch within 12 hours or as directed by MD 14 patch 0    metFORMIN (GLUCOPHAGE) 500 MG tablet Take 2 tablets (1,000 mg total) by mouth 2 (two) times daily with a meal. 60 tablet 5    Prenatal Vit-Fe Fumarate-FA (MULTIVITAMIN-PRENATAL) 27-0.8 MG TABS tablet Take 1 tablet by mouth daily at 12 noon.      terconazole (TERAZOL 3) 0.8 % vaginal cream Place 1 applicator  vaginally at bedtime. 20 g 0      Review of Systems  All systems reviewed and negative except as stated in HPI  Physical Exam BP 122/72   Pulse 86   Temp 98 F (36.7 C) (Oral)   Resp 18   Ht 5\' 4"  (1.626 m)   Wt 108.9 kg   LMP 10/16/2022   BMI 41.20 kg/m   Physical Exam Constitutional:  General: She is not in acute distress.    Appearance: She is obese.  Cardiovascular:     Rate and Rhythm: Normal rate and regular rhythm.  Pulmonary:     Effort: Pulmonary effort is normal.  Abdominal:     Comments: Gravid  Musculoskeletal:        General: No swelling.  Skin:    General: Skin is warm and dry.  Neurological:     General: No focal deficit present.  Psychiatric:        Mood and Affect: Mood normal.   Presentation: cephalic by U/S  Fetal monitoring: Baseline: 150 bpm, Variability: Good {> 6 bpm), Accelerations: Reactive, and Decelerations: Absent Uterine activity: UI     Prenatal labs: ABO, Rh: --/--/PENDING (10/11 0043) Antibody: PENDING (10/11 0043) Rubella: >33.00 (06/19 1329) RPR: Non Reactive (06/19 1329)  HBsAg: Negative (06/19 1329)  HIV: Non Reactive (06/19 1329)  GC/Chlamydia:  Neisseria Gonorrhea  Date Value Ref Range Status  06/18/2023 Negative  Final   Chlamydia  Date Value Ref Range Status  06/18/2023 Negative  Final   GBS:   PCR pending   Prenatal Transfer Tool  Maternal Diabetes: Yes:  Diabetes Type:  Insulin/Medication controlled Genetic Screening: Normal Maternal Ultrasounds/Referrals: LGA Fetal Ultrasounds or other Referrals:  None Maternal Substance Abuse:  No Significant Maternal Medications:  Metformin, ASA Significant Maternal Lab Results: Other:  GBS unknown  Results for orders placed or performed during the hospital encounter of 06/21/23 (from the past 24 hour(s))  Type and screen   Collection Time: 06/21/23 12:43 AM  Result Value Ref Range   ABO/RH(D) PENDING    Antibody Screen PENDING    Sample Expiration       06/24/2023,2359 Performed at [facility], [address], [place], [place] [address]     Assessment: Nahal Wanless is a 42 y.o. [id number] at [redacted]w[redacted]d here for IOL 2/2 uncontrolled A2GDM, poly.  #Labor: Plan for dual cytotec. Pt amenable to FB, AROM, pit as needed #Pain: IV pain meds PRN, epidural upon request #FHT: Category I #GBS/ID: PCR pending #MOF: breast feeding #MOC:  Nexplanon OP  #A2GDM: CBG Q4 latent, Q2 active. Continue metformin. Low threshold for endotool. Suspect LGA babe #Poly: noted #Anemia: admit Hgb pending  Joanne Gavel, MD [facility], [facility]  06/21/2023, 1:24 AM
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
## 2023-06-21 NOTE — Transfer of Care (Signed)
Immediate Anesthesia Transfer of Care Note  Patient: [name]  Procedure(s) Performed: CESAREAN SECTION WITH BILATERAL TUBAL LIGATION  Patient Location: PACU  Anesthesia Type:Spinal  Level of Consciousness: awake, alert , and oriented  Airway & Oxygen Therapy: Patient Spontanous Breathing  Post-op Assessment: Report given to RN and Post -op Vital signs reviewed and stable  Post vital signs: Reviewed and stable  Last Vitals:  Vitals Value Taken Time  BP 116/76 06/21/23 1945  Temp 36.5 C 06/21/23 1935  Pulse 87 06/21/23 1955  Resp 46 06/21/23 1955  SpO2 95 % 06/21/23 1955  Vitals shown include unfiled device data.  Last Pain:  Vitals:   06/21/23 1935  TempSrc: Oral  PainSc: 10-Worst pain ever         Complications: No notable events documented.

## 2023-06-21 NOTE — Anesthesia Postprocedure Evaluation (Signed)
Anesthesia Post Note  Patient: Proofreader  Procedure(s) Performed: CESAREAN SECTION WITH BILATERAL TUBAL LIGATION     Patient location during evaluation: PACU Anesthesia Type: Spinal Level of consciousness: oriented and awake and alert Pain management: pain level controlled Vital Signs Assessment: post-procedure vital signs reviewed and stable Respiratory status: spontaneous breathing, respiratory function stable and nonlabored ventilation Cardiovascular status: blood pressure returned to baseline and stable Postop Assessment: no headache, no backache, no apparent nausea or vomiting, spinal receding and patient able to bend at knees Anesthetic complications: no   No notable events documented.  Last Vitals:  Vitals:   06/21/23 2000 06/21/23 2005  BP: 120/85   Pulse: 75 84  Resp: 13 (!) 22  Temp:  36.8 C  SpO2: 96% 96%    Last Pain:  Vitals:   06/21/23 2005  TempSrc: Oral  PainSc: 5    Pain Goal:                   Federick Levene A.

## 2023-06-21 NOTE — Anesthesia Preprocedure Evaluation (Signed)
Anesthesia Evaluation  Patient identified by MRN, date of birth, ID band Patient awake    Reviewed: Allergy & Precautions, H&P , NPO status , Patient's Chart, lab work & pertinent test results  History of Anesthesia Complications Negative for: history of anesthetic complications  Airway Mallampati: II  TM Distance: >3 FB Neck ROM: Full    Dental no notable dental hx.    Pulmonary neg pulmonary ROS, asthma    Pulmonary exam normal        Cardiovascular negative cardio ROS Normal cardiovascular exam     Neuro/Psych  Headaches  Anxiety Depression    negative neurological ROS  negative psych ROS   GI/Hepatic negative GI ROS, Neg liver ROS,GERD  Medicated and Controlled,,cholestasis   Endo/Other  negative endocrine ROSdiabetes, Gestational, Oral Hypoglycemic Agents    Renal/GU negative Renal ROS  negative genitourinary   Musculoskeletal negative musculoskeletal ROS (+) Arthritis ,    Abdominal  (+) + obese  Peds negative pediatric ROS (+)  Hematology  (+) Blood dyscrasia, anemia   Anesthesia Other Findings Day of surgery medications reviewed with patient.  Reproductive/Obstetrics (+) Pregnancy                             Anesthesia Physical Anesthesia Plan  ASA: 3  Anesthesia Plan: Spinal   Post-op Pain Management:    Induction: Intravenous  PONV Risk Score and Plan: Treatment may vary due to age or medical condition  Airway Management Planned: Natural Airway  Additional Equipment:   Intra-op Plan:   Post-operative Plan:   Informed Consent: I have reviewed the patients History and Physical, chart, labs and discussed the procedure including the risks, benefits and alternatives for the proposed anesthesia with the patient or authorized representative who has indicated his/her understanding and acceptance.     Dental advisory given  Plan Discussed with: CRNA  Anesthesia  Plan Comments:         Anesthesia Quick Evaluation

## 2023-06-21 NOTE — Interval H&P Note (Signed)
History and Physical Interval Note:  06/21/2023 5:53 PM  [name]  has presented today for surgery, with the diagnosis of primary cesarean section for macrosomia patent desires sterility.  The various methods of treatment have been discussed with the patient and family. After consideration of risks, benefits and other options for treatment, the patient has consented to  Procedure(s): CESAREAN SECTION WITH BILATERAL TUBAL LIGATION (N/A) as a surgical intervention.  The patient's history has been reviewed, patient examined, no change in status, stable for surgery.  I have reviewed the patient's chart and labs.  Questions were answered to the patient's satisfaction.     Levie Heritage

## 2023-06-21 NOTE — Op Note (Addendum)
[name] PROCEDURE DATE: 06/21/2023  PREOPERATIVE DIAGNOSIS: Intrauterine pregnancy at  [redacted]w[redacted]d weeks gestation; macrosomia  POSTOPERATIVE DIAGNOSIS: The same  PROCEDURE: Primary Low Transverse Cesarean Section with BTL  SURGEON:  Dr. Candelaria Celeste  ASSISTANT: Lamont Snowball, CNM    INDICATIONS: [name] is a 42 y.o. [id number] at [redacted]w[redacted]d scheduled for cesarean section secondary to macrosomia.  The risks of cesarean section discussed with the patient included but were not limited to: bleeding which may require transfusion or reoperation; infection which may require antibiotics; injury to bowel, bladder, ureters or other surrounding organs; injury to the fetus; need for additional procedures including hysterectomy in the event of a life-threatening hemorrhage; placental abnormalities wth subsequent pregnancies, incisional problems, thromboembolic phenomenon and other postoperative/anesthesia complications. The patient concurred with the proposed plan, giving informed written consent for the procedure.    FINDINGS:  Viable female infant in vertex presentation.  Apgars 8 and 9, weight, 9 pounds and 6 ounces.  Clear amniotic fluid.  Intact placenta, three vessel cord.  Normal uterus, fallopian tubes and ovaries bilaterally.  ANESTHESIA:    Spinal INTRAVENOUS FLUIDS: TXA given preoperatively, 1500 ml crystaloid, albumin, 1 unit PRBC ESTIMATED BLOOD LOSS: 2023 ml URINE OUTPUT:  100 ml SPECIMENS: Placenta sent to pathology COMPLICATIONS: Postpartum hemorrhage  PROCEDURE IN DETAIL:  The patient received intravenous antibiotics and had sequential compression devices applied to her lower extremities while in the preoperative area.  She was then taken to the operating room where spinal anesthesia was administered  and was found to be adequate. She was then placed in a dorsal supine position with a leftward tilt, and prepped and draped in a sterile manner.  A foley  catheter was placed into her bladder and attached to constant gravity, which drained clear fluid throughout.  After an adequate timeout was performed, a Pfannenstiel skin incision was made with scalpel and carried through to the underlying layer of fascia. The fascia was incised in the midline and this incision was extended bilaterally bluntly. Kocher clamps were applied to the superior aspect of the fascial incision and the underlying rectus muscles were dissected off bluntly. A similar process was carried out on the inferior aspect of the facial incision. The rectus muscles were separated in the midline bluntly and the peritoneum was entered bluntly. An Alexis retractor was placed to aid in visualization of the uterus.    Attention was turned to the lower uterine segment where a transverse hysterotomy was made with a scalpel and extended bilaterally bluntly. The infant was successfully delivered, and cord was clamped and cut and infant was handed over to awaiting neonatology team. Uterine massage was then administered and the placenta delivered intact with three-vessel cord. There was immediately a lot of bleeding from the uterus and hysterotomy. Ring forceps were placed on bleeding vessels and the uterus was massaged with good response. The uterus was then cleared of clot and debris.  The hysterotomy was closed with 0 Vicryl in a running fashion. Overall, excellent hemostasis was noted.   Attention was then turned to the patient's adnexa. The left fallopian tube was identified and followed out to the fimbriated end.  Ligasure device was used to cauterize and cut the mesosalpinx to proximal end of the fallopian tube, removing 6cm of tube. A similar process was carried out on the right side allowing for bilateral tubal sterilization.    At this point, the amount of blood loss was calculated and due to the blood loss, the patient  was given albumin and the decision was made to give the patient 2 units of PRBCs.  The antibiotics were redosed.   The abdomen and the pelvis were cleared of all clot and debris and the Jon Gills was removed. Hemostasis was confirmed on all surfaces.  The peritoneum was reapproximated using 2-0 vicryl running stitches. The fascia was then closed using 0 Vicryl in a running fashion. The subcutaneous layer was reapproximated with plain gut and the skin was closed with 4-0 vicryl. The patient tolerated the procedure well. Sponge, lap, instrument and needle counts were correct x 2. She was taken to the recovery room in stable condition.    Levie Heritage, DO 06/21/2023 7:19 PM

## 2023-06-21 NOTE — Progress Notes (Addendum)
Faculty Practice OB/GYN Attending Note  42 y.o. [name] (VD x 6) at [redacted]w[redacted]d admitted for IOL for poorly controlled A2GDM.  Patient does not have a recent ultrasound for growth, she did not do this due to cost concerns.  On evaluation during admission, there was concern about LGA fetus.  Growth scan done earlier today showed EFW 4460 g >99% (9 lb 13 oz) with AC > 99%. AFI at 19 cm but polyhydramnios present with a SDP of 10 cm, anterior placenta.  Of note, her largest baby weighed 3980 g; delivered vaginally without any complications in 2000.  Last vaginal delivery was remarkable for 3615 g infant.   Patient reports that this current baby is significantly bigger than all her other babies and she is concerned.  Discussed this EFW, stressed that this was an estimate and there is a chance that the infant's weight can be less.  But given her poorly controlled A2GDM and LGA, discussed risks of vaginal delivery attempt being complicated by shoulder dystocia; this risk can be up 15%.  Discussed details about shoulder dystocia and also about this being an obstetric emergency.  Emphasized possible risks of injury to the fetus and to the patient; fetal injuries that can be temporary or permanent and can in rare cases lead to fetal death.  She was assured that she has had six uncomplicated vaginal deliveries with at least three babies >3500 g, and has a higher likelihood of having a successful, uncomplicated vaginal delivery this time but this shoulder dystocia risk does exist.  The maternal-fetal risks associated with shoulder dystocia were explained in detail, all questions answered.  The alternative of doing an outright cesarean section was discussed with the patient.  Details of cesarean delivery reviewed with patient.  The risks of surgery were discussed with the patient including but were not limited to: bleeding which may require transfusion or reoperation; infection which may require antibiotics; injury to  bowel, bladder, ureters or other surrounding organs; injury to the fetus; need for additional procedures including hysterectomy in the event of a life-threatening hemorrhage; formation of adhesions; placental abnormalities with subsequent pregnancies; incisional problems; thromboembolic phenomenon and other postoperative/anesthesia complications.    Patient said she wants to proceed with cesarean delivery in other to avoid the risk of shoulder dystocia.  Patient also reported she desires permanent sterilization and wants this done at the time of cesarean section.  Other reversible forms of contraception were discussed with patient; she declines all other modalities. This  sterilization will likely be done via bilateral salpingectomy.  Additional risks of procedure discussed with patient including but not limited to: risk of regret, permanence of method, bleeding, infection, injury to surrounding organs and need for additional procedures.  Failure risk of <1% with increased risk of ectopic gestation if pregnancy occurs was also discussed with patient.  Also discussed possibility of post-tubal syndrome with increased pelvic pain or menstrual irregularities.   She has been NPO since yesterday evening.  Her hemoglobin is currently 7.9, and this is concerning given her high PPH risk. She was counseled about preoperative blood transfusion, she agreed to this. Transfusion ordered with 1 pRBC, post-transfusion hemoglobin ordered around 12 noon.   Patient's procedures were added on to the list of scheduled procedures,  she was told that this will be done later in the day today.  Written informed consent obtained.  Of note, prior to this encounter, patient had received misoprostol around 0140.  Rare contractions currently, Category 1 FHR tracing.  Patient feels some pain with contractions, IV analgesia ordered as needed.  Current cervical exam  is FT/long/posterior.  Will halt induction of labor for now.  As per Dr.  Armond Hang [facility] Anesthesiologist), she can continue clear liquid diet for now but this will be stopped at least 2 hours before surgery. Continue CBG checks every four hours and manage accordingly; last value was 100.   Continue close observation on L&D for now.   Jaynie Collins, MD, FACOG Obstetrician & Gynecologist, [facility] for [employer], [facility]

## 2023-06-21 NOTE — Progress Notes (Addendum)
Patient ID: [name], female   DOB: Aug 17, 1981, 42 y.o.   MRN: [id number]  Had a couple large clots with fundal massage in PACU. Uterus was firm on exam. TXA redosed.   Vitals 120/85, HR 75  Levie Heritage, DO

## 2023-06-21 NOTE — Anesthesia Procedure Notes (Signed)
Spinal  Patient location during procedure: OR Start time: 06/21/2023 6:02 PM End time: 06/21/2023 6:06 PM Reason for block: surgical anesthesia Staffing Performed: anesthesiologist  Anesthesiologist: Mal Amabile, MD Performed by: Mal Amabile, MD Authorized by: Mal Amabile, MD   Preanesthetic Checklist Completed: patient identified, IV checked, site marked, risks and benefits discussed, surgical consent, monitors and equipment checked, pre-op evaluation and timeout performed Spinal Block Patient position: sitting Prep: DuraPrep and site prepped and draped Patient monitoring: heart rate, cardiac monitor, continuous pulse ox and blood pressure Approach: midline Location: L3-4 Injection technique: single-shot Needle Needle type: Pencan  Needle gauge: 24 G Needle length: 9 cm Needle insertion depth: 7 cm Assessment Sensory level: T4 Events: CSF return Additional Notes Patient tolerated procedure well. Adequate sensory level. Transient paresthesia left buttock- resolved after repositioning needle.

## 2023-06-22 ENCOUNTER — Encounter (HOSPITAL_COMMUNITY): Payer: Self-pay | Admitting: Family Medicine

## 2023-06-22 LAB — TYPE AND SCREEN
ABO/RH(D): O POS
Antibody Screen: NEGATIVE
Unit division: 0
Unit division: 0
Unit division: 0

## 2023-06-22 LAB — CBC
HCT: 26.3 % — ABNORMAL LOW (ref 36.0–46.0)
Hemoglobin: 8.5 g/dL — ABNORMAL LOW (ref 12.0–15.0)
MCH: 26.3 pg (ref 26.0–34.0)
MCHC: 32.3 g/dL (ref 30.0–36.0)
MCV: 81.4 fL (ref 80.0–100.0)
Platelets: 240 10*3/uL (ref 150–400)
RBC: 3.23 MIL/uL — ABNORMAL LOW (ref 3.87–5.11)
RDW: 15.1 % (ref 11.5–15.5)
WBC: 9.4 10*3/uL (ref 4.0–10.5)
nRBC: 0 % (ref 0.0–0.2)

## 2023-06-22 LAB — BASIC METABOLIC PANEL
Anion gap: 11 (ref 5–15)
BUN: 6 mg/dL (ref 6–20)
CO2: 18 mmol/L — ABNORMAL LOW (ref 22–32)
Calcium: 8.1 mg/dL — ABNORMAL LOW (ref 8.9–10.3)
Chloride: 107 mmol/L (ref 98–111)
Creatinine, Ser: 0.72 mg/dL (ref 0.44–1.00)
GFR, Estimated: >60 mL/min (ref >60)
Glucose, Bld: 98 mg/dL (ref 70–99)
Potassium: 4.1 mmol/L (ref 3.5–5.1)
Sodium: 136 mmol/L (ref 135–145)

## 2023-06-22 LAB — BPAM RBC
Blood Product Expiration Date: 202411052359
Blood Product Expiration Date: 202411092359
Blood Product Expiration Date: 202411092359
ISSUE DATE / TIME: 202410110455
ISSUE DATE / TIME: 202410111853
ISSUE DATE / TIME: 202410111853
Unit Type and Rh: 5100
Unit Type and Rh: 5100
Unit Type and Rh: 5100

## 2023-06-22 MED ORDER — IBUPROFEN 600 MG PO TABS
600.0000 mg | ORAL_TABLET | Freq: Four times a day (QID) | ORAL | Status: DC | PRN
  Administered 2023-06-22 – 2023-06-24 (×8): 600 mg via ORAL
  Filled 2023-06-22 (×8): qty 1

## 2023-06-22 MED ORDER — LACTATED RINGERS IV BOLUS
500.0000 mL | Freq: Once | INTRAVENOUS | Status: AC
  Administered 2023-06-22: 500 mL via INTRAVENOUS

## 2023-06-22 NOTE — Lactation Note (Signed)
This note was copied from a baby's chart. Lactation Consultation Note  Patient Name: [name] [id number] Date: 06/22/2023 Age:42 hours Reason for consult: Initial assessment;Early term 37-38.6wks;Other (Comment);Maternal endocrine disorder (LGA. PPH of 2212 cc)  Visited with family of 42 hours old ETI female; Ms. [name] is a P7 and experienced breastfeeding. She reports baby has been going to breast consistently, her two glucose readings are WNL. Her plan is to primarily breastfeed. This LC assisted with the latch, took baby STS to the L side in cross cradle hold and she was able to latch after a couple of attempts, a few swallows heard upon breast compressions (see LATCH score). Baby still nursing when exiting the room at the 15 minutes mark. Offered to set up a DEBP but [name] voiced that she tried using it before and didn't know how to, she feels more comfortable using a hand pump, provided one. Reviewed normal ETI behavior, cluster feeding, feeding cues, size of baby's stomach, lactogenesis II and anticipatory guidelines.   Maternal Data Has patient been taught Hand Expression?: Yes Does the patient have breastfeeding experience prior to this delivery?: Yes How long did the patient breastfeed?: 2 years and 4 months  Feeding Mother's Current Feeding Choice: Breast Milk  LATCH Score Latch: Grasps breast easily, tongue down, lips flanged, rhythmical sucking.  Audible Swallowing: A few with stimulation  Type of Nipple: Everted at rest and after stimulation  Comfort (Breast/Nipple): Soft / non-tender  Hold (Positioning): No assistance needed to correctly position infant at breast.  LATCH Score: 9  Lactation Tools Discussed/Used Tools: Flanges;Pump Flange Size: 24 Breast pump type: Manual Pump Education: Setup, frequency, and cleaning;Milk Storage Reason for Pumping: patient's request Pumping frequency: PRN or after feedings/attempts at the  breast  Interventions Interventions: Breast feeding basics reviewed;Education;LC Services brochure;Assisted with latch;Skin to skin;Breast massage;Breast compression;Adjust position;Support pillows  Plan Encouraged to put baby to breast STS +8 times/24 hours or sooner if feeding cues are present Breast massage and hand expression and spoon feeding were also encouraged She'll pump after feedings/attempts at the breast and will offer any amount of EBM she might get  No other support person at this time. All questions and concerns answered, family to contact [facility] services PRN.  Discharge Pump: Manual  Consult Status Consult Status: Follow-up Date: 06/23/23 Follow-up type: In-patient   Lataysha Vohra Venetia Constable 06/22/2023, 10:55 AM

## 2023-06-22 NOTE — Progress Notes (Signed)
CSW received consult for hx of PPD and hx of suicide ideation.   When CSW arrived MOB was resting in bed and infant was asleep in bassinet; everyone appeared happy and comfortable   CSW explained CSW's role and the need to complete and assessment.  CSW inquired about MOB's MH hx and MOB acknowledged a hx of postpartum depression and hx of passive suicidal thoughts. Per MOB she has not had any suicide thoughts "In about 5 months; that's when I started the [facility] therapy with [facility]." MOB communicated she missed her appointment on yesterday (10/11) due to being inpatient however she plans to call on Monday (10/14) to reschedule. CSW provided education regarding the baby blues period vs. perinatal mood disorders, discussed treatment and gave resources for mental health follow up if concerns arise.  CSW recommends self-evaluation during the postpartum time period using the New Mom Checklist from Postpartum Progress and encouraged MOB to contact a medical professional if symptoms are noted at any time.  MOB presented with insight and awareness and did not demonstrate any acute MH symptoms.  MOB was able to identify local supports and communicated feeling comfortable seeking help if needed. CSW assessed for safety and MOB denied SI, HI,and DV.  CSW identifies no further need for intervention and no barriers to discharge at this time.   CSW utilized [employer] Margarita Grizzle [phone]) to assist with language barriers.  Blaine Hamper, MSW, LCSW Clinical Social Work [phone]

## 2023-06-22 NOTE — Progress Notes (Addendum)
POSTPARTUM PROGRESS NOTE  Subjective: [name] is a 42 y.o. [id number] s/p LTCS/BTL at [redacted]w[redacted]d with 2L EBL.  She reports she is doing well. No acute events overnight. She denies any problems with ambulating, voiding or po intake. Denies nausea or vomiting. She has not passed flatus. She has not had bowel movement. Pain is well controlled.  Bleeding from incision is minimal.  No dizziness, dyspnea, or chest palpitation.  Got call from RN that patient had low urine output.  725 mL cumulative urine output in past 12 hours.  Patient reports she fell asleep and forgot to drink fluids overnight.  Objective: BP 98/68 (BP Location: Left Arm)   Pulse 71   Temp 98.7 F (37.1 C) (Oral)   Resp 16   Ht 5\' 4"  (1.626 m)   Wt 108.9 kg   LMP 10/16/2022   SpO2 95%   Breastfeeding Yes   BMI 41.20 kg/m   Physical Exam:  General: alert, cooperative and no distress Chest: no respiratory distress Abdomen: soft, non-tender  incision well approximated with dry honeycomb dressing Uterine Fundus: firm, appropriately tender Extremities: No calf swelling or tenderness  no peripheral edema edema  Recent Labs    06/21/23 2156 06/22/23 0415  HGB 8.8* 8.5*  HCT 26.8* 26.3*    Assessment/Plan: [name] is a 42 y.o. [id number] s/p LTCS/BTL with 2L intra-op hemorrhage at [redacted]w[redacted]d for LGA in the setting of A2GDM.  POD#1: Doing well, pain well-controlled. H/H appropriate. -- Routine postpartum care, lactation support -- Encouraged up OOB -- Lovenox for VTE prophylaxis -- Contraception: bilateral tubal ligation -- Feeding: breast feeding -- Circumcision: N/A  -- Urine output/anemia: BMP ordered to check creatinine, 500 mL bolus LR given large volume intraoperative hemorrhage.  Cr 0.72 indicates no AKI.  Hgb stable 8.8>8.5.  Urine output low-normal, but not overly concerning and marginal decrease in H&H is further reassuring.  Will remove Foley.  Dispo: Plan for discharge in 2 days if  stable.  Dimitry Sharion Dove, MD  Evaluation and management procedures were performed by the [facility] Medicine Resident under my supervision. I was immediately available for direct supervision, assistance and direction throughout this encounter.  I also confirm that I have verified the information documented in the resident's note, and that I have also personally reperformed the pertinent components of the physical exam and all of the medical decision making activities.  I have also made any necessary editorial changes.   Mittie Bodo, MD Family Medicine - Obstetrics Fellow

## 2023-06-23 NOTE — Progress Notes (Signed)
POSTPARTUM PROGRESS NOTE  Subjective: [name] is a 42 y.o. [id number] POD#2 s/p LTCS/BTL at [redacted]w[redacted]d with 2L EBL.  She reports she is doing well. No acute events overnight. She denies any problems with ambulating, voiding or po intake. Denies nausea or vomiting. She has passed flatus. Pain is well controlled.  Lochia < menses.  No dizziness, dyspnea, or chest palpitation.   Objective: BP 101/73 (BP Location: Left Arm)   Pulse 71   Temp 98.6 F (37 C) (Oral)   Resp 16   Ht 5\' 4"  (1.626 m)   Wt 108.9 kg   LMP 10/16/2022   SpO2 98%   Breastfeeding Yes   BMI 41.20 kg/m   Physical Exam:  General: alert, cooperative and no distress Chest: no respiratory distress Abdomen: soft, non-tender  incision well approximated with dry honeycomb dressing Uterine Fundus: firm, appropriately tender Extremities: No calf swelling or tenderness  no peripheral edema edema  Recent Labs    06/21/23 2156 06/22/23 0415  HGB 8.8* 8.5*  HCT 26.8* 26.3*    Assessment/Plan: [name] is a 42 y.o. [id number] s/p LTCS/BTL with 2L intra-op hemorrhage at [redacted]w[redacted]d for LGA in the setting of A2GDM.  POD#2: Doing well, pain well-controlled. H/H appropriate. -- Routine postpartum care, lactation support -- Encouraged up OOB -- Lovenox for VTE prophylaxis -- Contraception: bilateral tubal ligation -- Feeding: breast feeding -- Circumcision: N/A  -- Urine output/anemia: BMP ordered to check creatinine, 500 mL bolus LR given large volume intraoperative hemorrhage.  Cr 0.72 indicates no AKI.  Hgb stable 8.8>8.5.  Urine output normal s/p foley removal Dispo: Plan for discharge 10/14 if stable.  Hessie Dibble, MD

## 2023-06-24 MED ORDER — OXYCODONE HCL 5 MG PO TABS: 5.0000 mg | ORAL_TABLET | ORAL | 0 refills | Status: DC | PRN

## 2023-06-24 MED ORDER — SENNOSIDES-DOCUSATE SODIUM 8.6-50 MG PO TABS: 2.0000 | ORAL_TABLET | Freq: Two times a day (BID) | ORAL | 0 refills | Status: DC | PRN

## 2023-06-24 NOTE — Social Work (Signed)
CSW received a consult for MOB needing a car seat for the infant, CSW informed MOB's RN volunteer services is currently out of car seats and the family will have to provide their own for the infant to discharge home.   Wende Neighbors, LCSWA Clinical Social Worker [phone]

## 2023-06-24 NOTE — Lactation Note (Signed)
This note was copied from a baby's chart. Lactation Consultation Note  Patient Name: Girl Natisha Trzcinski [id number] Date: 06/24/2023 Age:42 hours, Vivia [place] Spanish interpreter present  Reason for consult: Follow-up assessment;Early term 37-38.6wks;Maternal endocrine disorder;Infant weight loss (6 % weight loss) LC reviewed BF D/C teaching and the [facility] resources reviewed .  LC reviewed the doc flow sheets WNL .  LC stressed to mom once her milk is coming in, to come down from the supplementing with formula.   Maternal Data Has patient been taught Hand Expression?: Yes  Feeding Mother's Current Feeding Choice: Breast Milk  LATCH Score - 9    Lactation Tools Discussed/Used Tools: Pump Flange Size: 24 Breast pump type: Manual Pump Education: Milk Storage;Other (comment);Setup, frequency, and cleaning (spanish provided) Pumping frequency: PRN  Interventions  Education   Discharge Discharge Education: Engorgement and breast care;Warning signs for feeding baby Pump: Manual;Personal  Consult Status Consult Status: Complete Date: 06/24/23    Kathrin Greathouse 06/24/2023, 10:41 AM

## 2023-06-26 LAB — SURGICAL PATHOLOGY

## 2023-06-28 ENCOUNTER — Encounter: Payer: Self-pay | Admitting: Family Medicine

## 2023-06-28 ENCOUNTER — Ambulatory Visit: Payer: Self-pay

## 2023-07-16 ENCOUNTER — Telehealth (HOSPITAL_COMMUNITY): Payer: Self-pay | Admitting: *Deleted

## 2023-07-16 NOTE — Telephone Encounter (Signed)
07/16/2023  Name: [name] MRN: [id number] DOB: Apr 09, 1981  Reason for Call:  Transition of Care Hospital Discharge Call  Contact Status: Patient Contact Status: Message  Language assistant needed: Interpreter Mode: Telephonic Interpreter Interpreter Name: Jeb Levering [phone]        Follow-Up Questions:    Inocente Salles Postnatal Depression Scale:  In the Past 7 Days:    PHQ2-9 Depression Scale:     Discharge Follow-up:    Post-discharge interventions: NA  Salena Saner, RN 07/16/2023 14:36

## 2023-08-01 ENCOUNTER — Other Ambulatory Visit: Payer: Self-pay

## 2023-08-01 DIAGNOSIS — O24415 Gestational diabetes mellitus in pregnancy, controlled by oral hypoglycemic drugs: Secondary | ICD-10-CM

## 2023-08-02 ENCOUNTER — Encounter: Payer: Self-pay | Admitting: Family Medicine

## 2023-08-02 ENCOUNTER — Ambulatory Visit (INDEPENDENT_AMBULATORY_CARE_PROVIDER_SITE_OTHER): Payer: Medicaid Other | Admitting: Family Medicine

## 2023-08-02 ENCOUNTER — Other Ambulatory Visit: Payer: Self-pay

## 2023-08-02 DIAGNOSIS — Z9851 Tubal ligation status: Secondary | ICD-10-CM | POA: Diagnosis not present

## 2023-08-02 DIAGNOSIS — O24415 Gestational diabetes mellitus in pregnancy, controlled by oral hypoglycemic drugs: Secondary | ICD-10-CM

## 2023-08-02 NOTE — Progress Notes (Signed)
Post Partum Visit Note  Empryss Newnum is a 42 y.o. [id number] female who presents for a postpartum visit. She is 6 weeks postpartum following a repeat cesarean section.  I have fully reviewed the prenatal and intrapartum course. The delivery was at 37 gestational weeks.  Anesthesia: spinal. Postpartum course has been uneventful. Baby is doing well. Baby is feeding by breast. Bleeding no bleeding. Bowel function is  Constipation . Bladder function is normal. Patient is not sexually active. Contraception method is tubal ligation. Postpartum depression screening: negative.   Upstream - 08/02/23 0956       Pregnancy Intention Screening   Does the patient want to become pregnant in the next year? No    Does the patient's partner want to become pregnant in the next year? No    Would the patient like to discuss contraceptive options today? No      Contraception Wrap Up   Current Method Female Sterilization    End Method Female Sterilization    Contraception Counseling Provided No            The pregnancy intention screening data noted above was reviewed. Potential methods of contraception were discussed. The patient elected to proceed with Female Sterilization.   Edinburgh Postnatal Depression Scale - 08/02/23 0956       Edinburgh Postnatal Depression Scale:  In the Past 7 Days   I have been able to laugh and see the funny side of things. 0    I have looked forward with enjoyment to things. 0    I have blamed myself unnecessarily when things went wrong. 0    I have been anxious or worried for no good reason. 0    I have felt scared or panicky for no good reason. 0    Things have been getting on top of me. 0    I have been so unhappy that I have had difficulty sleeping. 1    I have felt sad or miserable. 0    I have been so unhappy that I have been crying. 1    The thought of harming myself has occurred to me. 0    Edinburgh Postnatal Depression Scale Total 2              Health Maintenance Due  Topic Date Due   INFLUENZA VACCINE  04/11/2023   COVID-19 Vaccine (1 - 2023-24 season) Never done   Cervical Cancer Screening (HPV/Pap Cotest)  07/14/2023    The following portions of the patient's history were reviewed and updated as appropriate: allergies, current medications, past family history, past medical history, past social history, past surgical history, and problem list.  Review of Systems Pertinent items noted in HPI and remainder of comprehensive ROS otherwise negative.  Objective:  BP 120/87   Pulse 78   Wt 216 lb (98 kg)   LMP 10/16/2022   Breastfeeding Yes   BMI 37.08 kg/m    General:  alert, cooperative, and appears stated age   Breasts:  not indicated  Lungs: Comfortalbe on room air  Wound well approximated incision  GU exam:  not indicated        Assessment:   No diagnosis found.   Normal postpartum exam.   Plan:   Essential components of care per ACOG recommendations:  1.  Mood and well being: Patient with negative depression screening today. Reviewed local resources for support.  - Patient tobacco use? No.   - hx of drug use? No.  2. Infant care and feeding:  -Patient currently breastmilk feeding? Yes. Reviewed importance of draining breast regularly to support lactation.  -Social determinants of health (SDOH) reviewed in EPIC. No concerns  3. Sexuality, contraception and birth spacing - Patient does not want a pregnancy in the next year.  Desired family size is 7 children.  - Reviewed reproductive life planning. Reviewed contraceptive methods based on pt preferences and effectiveness.  Patient desired Female Sterilization today.   - Discussed birth spacing of 18 months  4. Sleep and fatigue -Encouraged family/partner/community support of 4 hrs of uninterrupted sleep to help with mood and fatigue  5. Physical Recovery  - Discussed patients delivery and complications. She describes her labor as mixed. -  Patient had a C-section.  - Patient has urinary incontinence? No. - Patient is safe to resume physical and sexual activity  6.  Health Maintenance - HM due items addressed No - insurance - Last pap smear No results found for: "DIAGPAP" Pap smear not done at today's visit.  -Breast Cancer screening indicated? Yes. Patient referred today for mammogram.   7. Chronic Disease/Pregnancy Condition follow up: Gestational Diabetes - GDM: 2hr GTT today - PCP follow up   Venora Maples, MD, MPH, FAAFP Attending Family Medicine Physician, [facility] for [facility], [facility]

## 2023-08-15 ENCOUNTER — Other Ambulatory Visit: Payer: Medicaid Other

## 2023-08-15 ENCOUNTER — Other Ambulatory Visit: Payer: Self-pay

## 2023-08-15 DIAGNOSIS — O24419 Gestational diabetes mellitus in pregnancy, unspecified control: Secondary | ICD-10-CM

## 2023-08-16 ENCOUNTER — Encounter: Payer: Self-pay | Admitting: Family Medicine

## 2023-08-16 ENCOUNTER — Telehealth: Payer: Self-pay | Admitting: *Deleted

## 2023-08-16 DIAGNOSIS — R7303 Prediabetes: Secondary | ICD-10-CM | POA: Insufficient documentation

## 2023-08-16 LAB — GLUCOSE TOLERANCE, 2 HOURS
Glucose, 2 hour: 136 mg/dL (ref 70–139)
Glucose, GTT - Fasting: 113 mg/dL — ABNORMAL HIGH (ref 70–99)

## 2023-08-16 NOTE — Telephone Encounter (Signed)
-----   Message from [name] sent at 08/16/2023  8:14 AM EST ----- Postpartum GTT was ABNORMAL Please let patient know she has pre-diabetes and tell her to get connected with a PCP ASAP to discuss and manage

## 2023-08-16 NOTE — Telephone Encounter (Signed)
I called [name] with [facility] and gave her results and recommendation. She states she does not have a PCP but will get one. I informed her if she needs [place] to make a referral to let us know and we will. I encouraged her to schedule asap. She voices understanding. Nancy Fetter

## 2024-01-28 ENCOUNTER — Ambulatory Visit (HOSPITAL_COMMUNITY)
Admission: EM | Admit: 2024-01-28 | Discharge: 2024-01-28 | Disposition: A | Discharge status: Home / Self Care | Payer: Self-pay | Attending: Emergency Medicine | Admitting: Emergency Medicine

## 2024-01-28 ENCOUNTER — Encounter (HOSPITAL_COMMUNITY): Payer: Self-pay

## 2024-01-28 DIAGNOSIS — S025XXB Fracture of tooth (traumatic), initial encounter for open fracture: Secondary | ICD-10-CM

## 2024-01-28 DIAGNOSIS — K047 Periapical abscess without sinus: Secondary | ICD-10-CM

## 2024-01-28 DIAGNOSIS — K0889 Other specified disorders of teeth and supporting structures: Secondary | ICD-10-CM

## 2024-01-28 MED ORDER — KETOROLAC TROMETHAMINE 30 MG/ML IJ SOLN
INTRAMUSCULAR | Status: AC
  Filled 2024-01-28: qty 1

## 2024-01-28 MED ORDER — KETOROLAC TROMETHAMINE 30 MG/ML IJ SOLN
30.0000 mg | Freq: Once | INTRAMUSCULAR | Status: AC
  Administered 2024-01-28: 30 mg via INTRAMUSCULAR

## 2024-01-28 MED ORDER — NYSTATIN 100000 UNIT/ML MT SUSP: 5.0000 mL | Freq: Four times a day (QID) | OROMUCOSAL | 0 refills | Status: AC | PRN

## 2024-01-28 MED ORDER — AMOXICILLIN 875 MG PO TABS: 875.0000 mg | ORAL_TABLET | Freq: Two times a day (BID) | ORAL | 0 refills | Status: AC

## 2024-01-28 MED ORDER — IBUPROFEN 600 MG PO TABS: 600.0000 mg | ORAL_TABLET | Freq: Four times a day (QID) | ORAL | 0 refills | Status: AC | PRN

## 2024-01-28 NOTE — Discharge Instructions (Addendum)
 Start taking amoxicillin  twice daily for 10 days for dental infection. You received an injection of Toradol  in clinic today.  Do not take any ibuprofen  within 24 hours of receiving this injection. I have prescribed 600 mg ibuprofen  that you can take every 6 hours as needed for pain after 24 hours. Otherwise you can take 650 mg of Tylenol  every 6-8 hours as needed for pain. Use Magic mouthwash up to 4 times daily to help with inflammation causing your pain.  Swish and spit this. I have attached some dental resources that you can follow-up with. You could also try calling urgent tooth: Address: [address] [place], [place], [place] [address] Phone: [phone]

## 2024-01-28 NOTE — ED Provider Notes (Signed)
 [facility]    CSN: [id number] Arrival date & time: 01/28/24  1844      History   Chief Complaint Chief Complaint  Patient presents with   Dental Pain    HPI [name] is a 43 y.o. female.   Patient presents with left upper dental pain that radiates to the left side of her face and is causing her to have a headache.  Patient states that she has  had a broken tooth for a while that has not bothered her, but then began to have pain 2 days ago.  Patient is currently breast-feeding.  Patient reports taking Tylenol  with minimal relief.  The history is provided by the patient and medical records.  Dental Pain   Past Medical History:  Diagnosis Date   AMA (advanced maternal age) multigravida 35+ 12/26/2018   Anxiety    relative to circumstances to back problems/injury, not taking any medicine, pt. reports has been crying a lot   Arthritis    low back   ASCUS of cervix with negative high risk HPV 10/20/2018   Asthma    Back pain    Cholelithiasis 04/08/2020   Chronic pain following surgery or procedure 05/29/2013   Depression    Depression with suicidal ideation 05/29/2013   GERD (gastroesophageal reflux disease)    HNP (herniated nucleus pulposus), lumbar 02/04/2013   Maternal obesity affecting pregnancy, antepartum 09/27/2020   Migraines    Neck pain    Postpartum hemorrhage 06/21/2023   PTSD (post-traumatic stress disorder) 05/29/2013   Suicide attempt by multiple drug overdose (HCC) 05/29/2013   Unstable lie of fetus 11/03/2020   Vaginal delivery 12/27/2018    Patient Active Problem List   Diagnosis Date Noted   Prediabetes 08/16/2023   S/P tubal ligation 05/23/2023   History of gestational diabetes 03/04/2023   BMI 40.0-44.9, adult (HCC) 03/04/2023   Grand multiparity 03/04/2023   Language barrier 11/21/2018    Past Surgical History:  Procedure Laterality Date   CESAREAN SECTION WITH BILATERAL TUBAL LIGATION N/A 06/21/2023    Procedure: CESAREAN SECTION WITH BILATERAL TUBAL LIGATION;  Surgeon: Malka Sea, DO;  Location: [facility];  Service: Obstetrics;  Laterality: N/A;   childbirth     x4 vaginal    LUMBAR LAMINECTOMY/DECOMPRESSION MICRODISCECTOMY  06/05/2012   Procedure: LUMBAR LAMINECTOMY/DECOMPRESSION MICRODISCECTOMY;  Surgeon: Estevan Helper, MD;  Location[facility];  Service: Orthopedics;  Laterality: Left;  Left sided lumbar 4-5 microdisectomy    OB History     Gravida  10   Para  7   Term  7   Preterm  0   AB  3   Living  7      SAB  0   IAB  2   Ectopic  1   Multiple  0   Live Births  7            Home Medications    Prior to Admission medications   Medication Sig Start Date End Date Taking? Authorizing Provider  amoxicillin  (AMOXIL ) 875 MG tablet Take 1 tablet (875 mg total) by mouth 2 (two) times daily for 10 days. 01/28/24 02/07/24 Yes Amai Cappiello A, NP  ibuprofen  (ADVIL ) 600 MG tablet Take 1 tablet (600 mg total) by mouth every 6 (six) hours as needed. 01/28/24  Yes Levora Reas A, NP  magic mouthwash (nystatin, lidocaine , diphenhydrAMINE ) suspension Take 5 mLs by mouth 4 (four) times daily as needed for mouth pain. 01/28/24  Yes  Levora Reas A, NP  albuterol  (VENTOLIN  HFA) 108 (90 Base) MCG/ACT inhaler Inhale 1-2 puffs into the lungs every 6 (six) hours as needed for wheezing or shortness of breath. 02/20/23   Manton Butter, PA  ferrous sulfate  324 MG TBEC Take 1 tablet (324 mg total) by mouth every other day for 42 doses. 03/07/23 08/02/23  Raynell Caller, MD  metFORMIN  (GLUCOPHAGE ) 500 MG tablet Take 2 tablets (1,000 mg total) by mouth 2 (two) times daily with a meal. Patient not taking: Reported on 08/02/2023 06/18/23   Teena Feast, MD  Prenatal Vit-Fe Fumarate-FA (MULTIVITAMIN-PRENATAL) 27-0.8 MG TABS tablet Take 1 tablet by mouth daily at 12 noon.    [provider]    Family History Family History  Problem Relation Age of Onset    Asthma Brother     Social History Social History   Tobacco Use   Smoking status: Never   Smokeless tobacco: Never  Vaping Use   Vaping status: Never Used  Substance Use Topics   Alcohol use: No   Drug use: No     Allergies   Hydrocodone -acetaminophen    Review of Systems Review of Systems  Per HPI  Physical Exam Triage Vital Signs ED Triage Vitals [01/28/24 2005]  Encounter Vitals Group     BP (!) 137/93     Systolic BP Percentile      Diastolic BP Percentile      Pulse Rate 76     Resp 16     Temp 99 F (37.2 C)     Temp Source Oral     SpO2 96 %     Weight      Height      Head Circumference      Peak Flow      Pain Score 10     Pain Loc      Pain Education      Exclude from Growth Chart    No data found.  Updated Vital Signs BP (!) 137/93 (BP Location: Right Arm)   Pulse 76   Temp 99 F (37.2 C) (Oral)   Resp 16   LMP 01/20/2024 (Approximate)   SpO2 96%   Breastfeeding Yes   Visual Acuity Right Eye Distance:   Left Eye Distance:   Bilateral Distance:    Right Eye Near:   Left Eye Near:    Bilateral Near:     Physical Exam Vitals and nursing note reviewed.  Constitutional:      General: She is awake. She is not in acute distress.    Appearance: Normal appearance. She is well-developed and well-groomed. She is not ill-appearing.  HENT:     Mouth/Throat:     Dentition: Abnormal dentition. Dental tenderness and gingival swelling present.   Skin:    General: Skin is warm and dry.  Neurological:     Mental Status: [name].  Psychiatric:        Behavior: Behavior is cooperative.      UC Treatments / Results  Labs (all labs ordered are listed, but only abnormal results are displayed) Labs Reviewed - No data to display  EKG   Radiology No results found.  Procedures Procedures (including critical care time)  Medications Ordered in UC Medications  ketorolac  (TORADOL ) 30 MG/ML injection 30 mg (has no administration  in time range)    Initial Impression / Assessment and Plan / UC Course  I have reviewed the triage vital signs and the nursing notes.  Pertinent labs & imaging results that were available during my care of the patient were reviewed by me and considered in my medical decision making (see chart for details).     Patient is well-appearing, but appears to be in a lot of pain cradling her face.  Upon assessment there is an open fracture of a tooth on the left upper side with surrounding gingival swelling and erythema consistent with dental infection.  Given one-time dose of Toradol  in clinic for acute pain.  Prescribed amoxicillin  for dental infection coverage.  Prescribed ibuprofen  as needed for pain.  Prescribed Magic mouthwash for additional pain relief.  Given dental resources to follow-up with.  Discussed follow-up and return precautions. Final Clinical Impressions(s) / UC Diagnoses   Final diagnoses:  Pain, dental  Dental infection  Open fracture of tooth, initial encounter     Discharge Instructions      Start taking amoxicillin  twice daily for 10 days for dental infection. You received an injection of Toradol  in clinic today.  Do not take any ibuprofen  within 24 hours of receiving this injection. I have prescribed 600 mg ibuprofen  that you can take every 6 hours as needed for pain after 24 hours. Otherwise you can take 650 mg of Tylenol  every 6-8 hours as needed for pain. Use Magic mouthwash up to 4 times daily to help with inflammation causing your pain.  Swish and spit this. I have attached some dental resources that you can follow-up with. You could also try calling urgent tooth: Address: [address] [place], [place], [place] [address] Phone: [phone]  ED Prescriptions     Medication Sig Dispense Auth. Provider   amoxicillin  (AMOXIL ) 875 MG tablet Take 1 tablet (875 mg total) by mouth 2 (two) times daily for 10 days. 20 tablet Levora Reas A, NP   ibuprofen  (ADVIL )  600 MG tablet Take 1 tablet (600 mg total) by mouth every 6 (six) hours as needed. 30 tablet Levora Reas A, NP   magic mouthwash (nystatin, lidocaine , diphenhydrAMINE ) suspension Take 5 mLs by mouth 4 (four) times daily as needed for mouth pain. 180 mL Levora Reas A, NP      PDMP not reviewed this encounter.   Levora Reas A, NP 01/28/24 2038

## 2024-01-28 NOTE — ED Triage Notes (Signed)
 Patient here today with c/o dental pain on the left upper side X 2 days. The pain radiates and she is also having a headache.

## 2024-04-29 ENCOUNTER — Emergency Department (HOSPITAL_COMMUNITY): Payer: Self-pay

## 2024-04-29 ENCOUNTER — Emergency Department (HOSPITAL_COMMUNITY)
Admission: EM | Admit: 2024-04-29 | Discharge: 2024-04-30 | Disposition: A | Discharge status: Home / Self Care | Payer: Self-pay | Attending: Emergency Medicine | Admitting: Emergency Medicine

## 2024-04-29 ENCOUNTER — Other Ambulatory Visit: Payer: Self-pay

## 2024-04-29 ENCOUNTER — Encounter (HOSPITAL_COMMUNITY): Payer: Self-pay | Admitting: *Deleted

## 2024-04-29 DIAGNOSIS — M25572 Pain in left ankle and joints of left foot: Secondary | ICD-10-CM | POA: Insufficient documentation

## 2024-04-29 NOTE — ED Triage Notes (Signed)
 States she has pain in her left foot that started 3 weeks ago. Pain is worse today.

## 2024-04-30 NOTE — Discharge Instructions (Addendum)
 Today you were seen for left foot/ankle pain.  You may alternate taking Tylenol  and Motrin  as needed for pain.  You may also ice and elevate the affected leg.  You may use your ankle brace for support.  Please follow-up with your primary care if your symptoms persist for further evaluation and workup.  Thank you for letting us  treat you today. After reviewing your imaging, I feel you are safe to go home. Please follow up with your PCP in the next several days and provide them with your records from this visit. Return to the Emergency Room if pain becomes severe or symptoms worsen.

## 2024-04-30 NOTE — ED Provider Notes (Signed)
 [place] [facility] Provider Note   CSN: [id number] Arrival date & time: 04/29/24  2051     Patient presents with: Foot Pain   [name] is a 43 y.o. female presents today for left foot pain that began 3 weeks ago.  Patient states that the pain is worse today.  Patient denies injury, numbness, weakness, swelling, any other complaints at this time.    Foot Pain       Prior to Admission medications   Medication Sig Start Date End Date Taking? Authorizing Provider  albuterol  (VENTOLIN  HFA) 108 (90 Base) MCG/ACT inhaler Inhale 1-2 puffs into the lungs every 6 (six) hours as needed for wheezing or shortness of breath. 02/20/23   Silver Wonda LABOR, PA  ferrous sulfate  324 MG TBEC Take 1 tablet (324 mg total) by mouth every other day for 42 doses. 03/07/23 08/02/23  Izell Harari, MD  ibuprofen  (ADVIL ) 600 MG tablet Take 1 tablet (600 mg total) by mouth every 6 (six) hours as needed. 01/28/24   Johnie Flaming A, NP  magic mouthwash (nystatin , lidocaine , diphenhydrAMINE ) suspension Take 5 mLs by mouth 4 (four) times daily as needed for mouth pain. 01/28/24   Johnie Flaming A, NP  metFORMIN  (GLUCOPHAGE ) 500 MG tablet Take 2 tablets (1,000 mg total) by mouth 2 (two) times daily with a meal. Patient not taking: Reported on 08/02/2023 06/18/23   Lola Donnice HERO, MD  Prenatal Vit-Fe Fumarate-FA (MULTIVITAMIN-PRENATAL) 27-0.8 MG TABS tablet Take 1 tablet by mouth daily at 12 noon.    [provider]    Allergies: Hydrocodone -acetaminophen     Review of Systems  Musculoskeletal:  Positive for arthralgias.    Updated Vital Signs BP 125/83 (BP Location: Right Arm)   Pulse 90   Temp 98 F (36.7 C)   Resp 16   Ht 5' 4 (1.626 m)   Wt 98 kg   LMP 04/01/2024   SpO2 94%   BMI 37.09 kg/m   Physical Exam Vitals and nursing note reviewed.  Constitutional:      General: She is not in acute distress.    Appearance: She is  well-developed.  HENT:     Head: Normocephalic and atraumatic.     Right Ear: External ear normal.     Left Ear: External ear normal.     Nose: Nose normal.  Eyes:     Conjunctiva/sclera: Conjunctivae normal.  Cardiovascular:     Rate and Rhythm: Normal rate and regular rhythm.     Heart sounds: No murmur heard. Pulmonary:     Effort: Pulmonary effort is normal. No respiratory distress.     Breath sounds: Normal breath sounds.  Abdominal:     Palpations: Abdomen is soft.     Tenderness: There is no abdominal tenderness.  Musculoskeletal:        General: Swelling and tenderness present.     Cervical back: Neck supple.     Right lower leg: No edema.     Left lower leg: No edema.  Skin:    General: Skin is warm and dry.     Capillary Refill: Capillary refill takes less than 2 seconds.     Findings: No bruising or erythema.     Comments: Patient with mild swelling and TTP to lateral left ankle without deformity, erythema, or ecchymosis noted on exam.  Patient is neurovascularly intact.  +2 dorsalis pedis pulses.  Neurological:     Mental Status: [name].  Psychiatric:  Mood and Affect: Mood normal.     (all labs ordered are listed, but only abnormal results are displayed) Labs Reviewed - No data to display  EKG: None  Radiology: DG Foot Complete Right Result Date: 04/29/2024 CLINICAL DATA:  Pain in the right foot second toe EXAM: RIGHT FOOT COMPLETE - 3+ VIEW COMPARISON:  None Available. FINDINGS: There is no evidence of fracture or dislocation. There is no evidence of arthropathy or other focal bone abnormality. Soft tissues are unremarkable. Small plantar calcaneal spur IMPRESSION: No acute osseous abnormality. Electronically Signed   By: Luke Bun M.D.   On: 04/29/2024 22:51   DG Ankle Complete Right Result Date: 04/29/2024 CLINICAL DATA:  Twisting injury EXAM: RIGHT ANKLE - COMPLETE 3+ VIEW COMPARISON:  None Available. FINDINGS: There is no evidence of  fracture, dislocation, or joint effusion. There is no evidence of arthropathy or other focal bone abnormality. Small plantar calcaneal spur. Lateral soft tissue swelling. IMPRESSION: Lateral soft tissue swelling without acute osseous abnormality. Electronically Signed   By: Luke Bun M.D.   On: 04/29/2024 22:22     Procedures   Medications Ordered in the ED - No data to display                                  Medical Decision Making  This patient presents to the ED for concern of left foot pain differential diagnosis includes fracture, dislocation, musculoskeletal pain   Imaging Studies ordered:  I ordered imaging studies including right foot and ankle x-rays I independently visualized and interpreted imaging which showed ankle with lateral soft tissue swelling without acute osseous abnormality, no acute osseous abnormality noted on foot I agree with the radiologist interpretation   Medicines ordered and prescription drug management: I ordered medication including Toradol     I have reviewed the patients home medicines and have made adjustments as needed   Problem List / ED Course:  Considered for admission or further workup however patient's vital signs, physical exam, and imaging are reassuring.  Patient symptoms likely due to musculoskeletal pain.  Patient advised to take Tylenol /Motrin  as needed for pain.  Patient given ASO for support.  I feel patient is safe for discharge at this time.     Final diagnoses:  Left ankle pain, unspecified chronicity    ED Discharge Orders     None          Francis Ileana SAILOR, PA-C 04/30/24 0325    Haze Lonni PARAS, MD 04/30/24 [phone]

## 2024-04-30 NOTE — Progress Notes (Signed)
 Orthopedic Tech Progress Note Patient Details:  [name] September 27, 1980 [id number]  Ortho Devices Type of Ortho Device: ASO Ortho Device/Splint Location: L ankle Ortho Device/Splint Interventions: Ordered, Application   Post Interventions Patient Tolerated: Well Instructions Provided: Care of device   [name] L [name] 04/30/2024, 3:48 AM
